# Patient Record
Sex: Female | Born: 1960
Health system: Southern US, Community
[De-identification: ages and names within clinical notes are randomized; demographics above are authoritative.]

## PROBLEM LIST (undated history)

## (undated) DIAGNOSIS — R0600 Dyspnea, unspecified: Secondary | ICD-10-CM

## (undated) DIAGNOSIS — J45901 Unspecified asthma with (acute) exacerbation: Secondary | ICD-10-CM

## (undated) DIAGNOSIS — Z8719 Personal history of other diseases of the digestive system: Secondary | ICD-10-CM

## (undated) DIAGNOSIS — M1990 Unspecified osteoarthritis, unspecified site: Secondary | ICD-10-CM

## (undated) DIAGNOSIS — F410 Panic disorder [episodic paroxysmal anxiety] without agoraphobia: Secondary | ICD-10-CM

## (undated) DIAGNOSIS — J189 Pneumonia, unspecified organism: Secondary | ICD-10-CM

## (undated) DIAGNOSIS — G5600 Carpal tunnel syndrome, unspecified upper limb: Secondary | ICD-10-CM

## (undated) DIAGNOSIS — F17200 Nicotine dependence, unspecified, uncomplicated: Secondary | ICD-10-CM

## (undated) DIAGNOSIS — K219 Gastro-esophageal reflux disease without esophagitis: Secondary | ICD-10-CM

## (undated) DIAGNOSIS — J449 Chronic obstructive pulmonary disease, unspecified: Secondary | ICD-10-CM

## (undated) HISTORY — DX: Chronic obstructive pulmonary disease, unspecified: J44.9

## (undated) HISTORY — PX: OOPHORECTOMY: SHX86

## (undated) HISTORY — DX: Gastro-esophageal reflux disease without esophagitis: K21.9

## (undated) HISTORY — DX: Carpal tunnel syndrome, unspecified upper limb: G56.00

## (undated) HISTORY — DX: Nicotine dependence, unspecified, uncomplicated: F17.200

## (undated) HISTORY — DX: Unspecified asthma with (acute) exacerbation: J45.901

## (undated) HISTORY — PX: APPENDECTOMY: SHX54

## (undated) HISTORY — DX: Personal history of other diseases of the digestive system: Z87.19

## (undated) HISTORY — DX: Panic disorder (episodic paroxysmal anxiety): F41.0

---

## 1998-04-27 ENCOUNTER — Emergency Department (HOSPITAL_COMMUNITY): Admission: EM | Admit: 1998-04-27 | Discharge: 1998-04-27 | Payer: Self-pay | Admitting: Internal Medicine

## 1998-05-18 ENCOUNTER — Inpatient Hospital Stay (HOSPITAL_COMMUNITY): Admission: AD | Admit: 1998-05-18 | Discharge: 1998-05-18 | Payer: Self-pay | Admitting: Obstetrics

## 1998-05-28 ENCOUNTER — Other Ambulatory Visit: Admission: RE | Admit: 1998-05-28 | Discharge: 1998-05-28 | Payer: Self-pay | Admitting: Gynecology

## 1998-07-09 ENCOUNTER — Other Ambulatory Visit: Admission: RE | Admit: 1998-07-09 | Discharge: 1998-07-09 | Payer: Self-pay | Admitting: Gynecology

## 1998-11-11 ENCOUNTER — Emergency Department (HOSPITAL_COMMUNITY): Admission: EM | Admit: 1998-11-11 | Discharge: 1998-11-11 | Payer: Self-pay | Admitting: Emergency Medicine

## 1999-05-18 ENCOUNTER — Encounter: Payer: Self-pay | Admitting: Emergency Medicine

## 1999-05-18 ENCOUNTER — Emergency Department (HOSPITAL_COMMUNITY): Admission: EM | Admit: 1999-05-18 | Discharge: 1999-05-18 | Payer: Self-pay | Admitting: Emergency Medicine

## 1999-06-18 ENCOUNTER — Emergency Department (HOSPITAL_COMMUNITY): Admission: EM | Admit: 1999-06-18 | Discharge: 1999-06-18 | Payer: Self-pay | Admitting: Emergency Medicine

## 2002-10-23 ENCOUNTER — Emergency Department (HOSPITAL_COMMUNITY): Admission: EM | Admit: 2002-10-23 | Discharge: 2002-10-23 | Payer: Self-pay | Admitting: Emergency Medicine

## 2002-10-23 ENCOUNTER — Encounter: Payer: Self-pay | Admitting: Emergency Medicine

## 2003-07-01 ENCOUNTER — Emergency Department (HOSPITAL_COMMUNITY): Admission: EM | Admit: 2003-07-01 | Discharge: 2003-07-01 | Payer: Self-pay | Admitting: Emergency Medicine

## 2003-08-14 ENCOUNTER — Ambulatory Visit (HOSPITAL_COMMUNITY): Admission: RE | Admit: 2003-08-14 | Discharge: 2003-08-14 | Payer: Self-pay | Admitting: Internal Medicine

## 2003-10-10 ENCOUNTER — Emergency Department (HOSPITAL_COMMUNITY): Admission: EM | Admit: 2003-10-10 | Discharge: 2003-10-10 | Payer: Self-pay | Admitting: Emergency Medicine

## 2004-07-02 ENCOUNTER — Ambulatory Visit (HOSPITAL_COMMUNITY): Admission: RE | Admit: 2004-07-02 | Discharge: 2004-07-02 | Payer: Self-pay | Admitting: Internal Medicine

## 2004-07-10 ENCOUNTER — Inpatient Hospital Stay (HOSPITAL_COMMUNITY): Admission: AD | Admit: 2004-07-10 | Discharge: 2004-07-10 | Payer: Self-pay | Admitting: *Deleted

## 2004-07-13 ENCOUNTER — Encounter (HOSPITAL_COMMUNITY): Admission: RE | Admit: 2004-07-13 | Discharge: 2004-07-14 | Payer: Self-pay | Admitting: Internal Medicine

## 2004-08-10 ENCOUNTER — Ambulatory Visit: Payer: Self-pay | Admitting: Obstetrics and Gynecology

## 2004-09-07 ENCOUNTER — Ambulatory Visit: Payer: Self-pay | Admitting: Obstetrics & Gynecology

## 2004-09-07 ENCOUNTER — Ambulatory Visit (HOSPITAL_COMMUNITY): Admission: RE | Admit: 2004-09-07 | Discharge: 2004-09-07 | Payer: Self-pay | Admitting: Internal Medicine

## 2004-10-07 ENCOUNTER — Inpatient Hospital Stay (HOSPITAL_COMMUNITY): Admission: AD | Admit: 2004-10-07 | Discharge: 2004-10-07 | Payer: Self-pay | Admitting: Obstetrics & Gynecology

## 2004-10-09 ENCOUNTER — Inpatient Hospital Stay (HOSPITAL_COMMUNITY): Admission: AD | Admit: 2004-10-09 | Discharge: 2004-10-09 | Payer: Self-pay | Admitting: Family Medicine

## 2004-11-02 ENCOUNTER — Ambulatory Visit: Payer: Self-pay | Admitting: Obstetrics & Gynecology

## 2005-09-07 ENCOUNTER — Inpatient Hospital Stay (HOSPITAL_COMMUNITY): Admission: AD | Admit: 2005-09-07 | Discharge: 2005-09-07 | Payer: Self-pay | Admitting: Obstetrics and Gynecology

## 2005-10-12 ENCOUNTER — Ambulatory Visit (HOSPITAL_COMMUNITY): Admission: RE | Admit: 2005-10-12 | Discharge: 2005-10-12 | Payer: Self-pay | Admitting: Internal Medicine

## 2006-05-30 ENCOUNTER — Encounter: Admission: RE | Admit: 2006-05-30 | Discharge: 2006-05-30 | Payer: Self-pay | Admitting: Family Medicine

## 2006-06-21 ENCOUNTER — Ambulatory Visit: Payer: Self-pay | Admitting: Obstetrics & Gynecology

## 2006-07-21 ENCOUNTER — Ambulatory Visit: Payer: Self-pay | Admitting: Internal Medicine

## 2006-09-07 ENCOUNTER — Ambulatory Visit: Payer: Self-pay | Admitting: Internal Medicine

## 2006-11-02 ENCOUNTER — Ambulatory Visit: Payer: Self-pay | Admitting: Internal Medicine

## 2006-12-06 ENCOUNTER — Ambulatory Visit: Payer: Self-pay | Admitting: Family Medicine

## 2007-01-02 ENCOUNTER — Ambulatory Visit: Payer: Self-pay | Admitting: Internal Medicine

## 2007-01-31 ENCOUNTER — Ambulatory Visit: Payer: Self-pay | Admitting: Internal Medicine

## 2007-02-09 ENCOUNTER — Ambulatory Visit: Payer: Self-pay | Admitting: Internal Medicine

## 2007-04-18 ENCOUNTER — Encounter: Payer: Self-pay | Admitting: Internal Medicine

## 2007-04-18 DIAGNOSIS — Z8719 Personal history of other diseases of the digestive system: Secondary | ICD-10-CM

## 2007-04-18 DIAGNOSIS — F410 Panic disorder [episodic paroxysmal anxiety] without agoraphobia: Secondary | ICD-10-CM | POA: Insufficient documentation

## 2007-04-18 DIAGNOSIS — M159 Polyosteoarthritis, unspecified: Secondary | ICD-10-CM | POA: Insufficient documentation

## 2007-04-18 DIAGNOSIS — K219 Gastro-esophageal reflux disease without esophagitis: Secondary | ICD-10-CM

## 2007-04-18 HISTORY — DX: Gastro-esophageal reflux disease without esophagitis: K21.9

## 2007-04-18 HISTORY — DX: Panic disorder (episodic paroxysmal anxiety): F41.0

## 2007-04-18 HISTORY — DX: Personal history of other diseases of the digestive system: Z87.19

## 2007-04-19 ENCOUNTER — Encounter: Payer: Self-pay | Admitting: Internal Medicine

## 2007-04-19 ENCOUNTER — Ambulatory Visit: Payer: Self-pay | Admitting: Internal Medicine

## 2007-04-19 DIAGNOSIS — R0989 Other specified symptoms and signs involving the circulatory and respiratory systems: Secondary | ICD-10-CM | POA: Insufficient documentation

## 2007-05-31 ENCOUNTER — Emergency Department (HOSPITAL_COMMUNITY): Admission: EM | Admit: 2007-05-31 | Discharge: 2007-05-31 | Payer: Self-pay | Admitting: Emergency Medicine

## 2007-06-27 ENCOUNTER — Ambulatory Visit: Payer: Self-pay | Admitting: Internal Medicine

## 2007-06-27 ENCOUNTER — Telehealth: Payer: Self-pay | Admitting: Internal Medicine

## 2007-06-29 ENCOUNTER — Encounter (INDEPENDENT_AMBULATORY_CARE_PROVIDER_SITE_OTHER): Payer: Self-pay

## 2007-07-23 ENCOUNTER — Telehealth (INDEPENDENT_AMBULATORY_CARE_PROVIDER_SITE_OTHER): Payer: Self-pay | Admitting: *Deleted

## 2007-08-23 ENCOUNTER — Telehealth (INDEPENDENT_AMBULATORY_CARE_PROVIDER_SITE_OTHER): Payer: Self-pay | Admitting: *Deleted

## 2007-08-31 ENCOUNTER — Telehealth: Payer: Self-pay | Admitting: *Deleted

## 2007-09-11 ENCOUNTER — Ambulatory Visit: Payer: Self-pay | Admitting: Internal Medicine

## 2007-09-19 ENCOUNTER — Telehealth: Payer: Self-pay | Admitting: Family Medicine

## 2007-09-20 ENCOUNTER — Ambulatory Visit: Payer: Self-pay | Admitting: Internal Medicine

## 2007-09-20 LAB — CONVERTED CEMR LAB
Bilirubin Urine: NEGATIVE
Blood in Urine, dipstick: NEGATIVE
Glucose, Urine, Semiquant: NEGATIVE
Ketones, urine, test strip: NEGATIVE
Nitrite: NEGATIVE
Protein, U semiquant: NEGATIVE
Specific Gravity, Urine: 1.02
Urobilinogen, UA: NEGATIVE
WBC Urine, dipstick: NEGATIVE
pH: 5

## 2007-10-23 ENCOUNTER — Telehealth (INDEPENDENT_AMBULATORY_CARE_PROVIDER_SITE_OTHER): Payer: Self-pay | Admitting: *Deleted

## 2007-11-06 ENCOUNTER — Ambulatory Visit: Payer: Self-pay | Admitting: Internal Medicine

## 2007-11-20 ENCOUNTER — Ambulatory Visit: Payer: Self-pay | Admitting: Family Medicine

## 2007-11-21 ENCOUNTER — Ambulatory Visit (HOSPITAL_COMMUNITY): Admission: RE | Admit: 2007-11-21 | Discharge: 2007-11-21 | Payer: Self-pay | Admitting: Family Medicine

## 2007-11-23 ENCOUNTER — Telehealth: Payer: Self-pay | Admitting: Family Medicine

## 2007-11-23 ENCOUNTER — Ambulatory Visit: Payer: Self-pay | Admitting: Family Medicine

## 2007-11-26 ENCOUNTER — Ambulatory Visit: Payer: Self-pay | Admitting: Internal Medicine

## 2007-12-05 ENCOUNTER — Telehealth: Payer: Self-pay | Admitting: Internal Medicine

## 2007-12-12 ENCOUNTER — Telehealth: Payer: Self-pay | Admitting: Internal Medicine

## 2008-01-09 ENCOUNTER — Telehealth: Payer: Self-pay | Admitting: Internal Medicine

## 2008-01-16 ENCOUNTER — Ambulatory Visit: Payer: Self-pay | Admitting: Internal Medicine

## 2008-01-16 DIAGNOSIS — G5600 Carpal tunnel syndrome, unspecified upper limb: Secondary | ICD-10-CM

## 2008-01-16 HISTORY — DX: Carpal tunnel syndrome, unspecified upper limb: G56.00

## 2008-02-13 ENCOUNTER — Telehealth: Payer: Self-pay | Admitting: Internal Medicine

## 2008-02-14 ENCOUNTER — Telehealth: Payer: Self-pay | Admitting: Internal Medicine

## 2008-02-27 ENCOUNTER — Ambulatory Visit: Payer: Self-pay | Admitting: Internal Medicine

## 2008-02-29 ENCOUNTER — Emergency Department (HOSPITAL_COMMUNITY): Admission: EM | Admit: 2008-02-29 | Discharge: 2008-02-29 | Payer: Self-pay | Admitting: Emergency Medicine

## 2008-03-14 ENCOUNTER — Ambulatory Visit: Payer: Self-pay | Admitting: Internal Medicine

## 2008-04-15 ENCOUNTER — Ambulatory Visit: Payer: Self-pay | Admitting: Internal Medicine

## 2008-05-06 ENCOUNTER — Telehealth: Payer: Self-pay | Admitting: Internal Medicine

## 2008-05-08 ENCOUNTER — Ambulatory Visit: Payer: Self-pay | Admitting: Internal Medicine

## 2008-05-08 DIAGNOSIS — J449 Chronic obstructive pulmonary disease, unspecified: Secondary | ICD-10-CM

## 2008-05-08 DIAGNOSIS — J4489 Other specified chronic obstructive pulmonary disease: Secondary | ICD-10-CM

## 2008-05-08 HISTORY — DX: Other specified chronic obstructive pulmonary disease: J44.89

## 2008-05-08 HISTORY — DX: Chronic obstructive pulmonary disease, unspecified: J44.9

## 2008-06-12 ENCOUNTER — Telehealth: Payer: Self-pay | Admitting: Family Medicine

## 2008-06-17 ENCOUNTER — Telehealth: Payer: Self-pay | Admitting: Internal Medicine

## 2008-08-21 ENCOUNTER — Ambulatory Visit: Payer: Self-pay | Admitting: Obstetrics and Gynecology

## 2008-08-21 ENCOUNTER — Encounter: Payer: Self-pay | Admitting: Obstetrics and Gynecology

## 2008-08-21 LAB — CONVERTED CEMR LAB
Glucose, Bld: 96 mg/dL (ref 70–99)
Hgb A1c MFr Bld: 5.6 % (ref 4.6–6.1)

## 2008-08-22 ENCOUNTER — Ambulatory Visit (HOSPITAL_COMMUNITY): Admission: RE | Admit: 2008-08-22 | Discharge: 2008-08-22 | Payer: Self-pay | Admitting: Obstetrics and Gynecology

## 2008-08-23 LAB — HM PAP SMEAR

## 2008-09-10 ENCOUNTER — Ambulatory Visit: Payer: Self-pay | Admitting: Obstetrics and Gynecology

## 2008-09-30 ENCOUNTER — Ambulatory Visit: Payer: Self-pay | Admitting: Internal Medicine

## 2008-11-04 ENCOUNTER — Ambulatory Visit (HOSPITAL_COMMUNITY): Admission: RE | Admit: 2008-11-04 | Discharge: 2008-11-04 | Payer: Self-pay | Admitting: Internal Medicine

## 2008-11-07 ENCOUNTER — Ambulatory Visit: Payer: Self-pay | Admitting: Internal Medicine

## 2008-12-23 ENCOUNTER — Ambulatory Visit: Payer: Self-pay | Admitting: Internal Medicine

## 2009-02-20 ENCOUNTER — Ambulatory Visit: Payer: Self-pay | Admitting: Family Medicine

## 2009-02-20 DIAGNOSIS — M542 Cervicalgia: Secondary | ICD-10-CM | POA: Insufficient documentation

## 2009-03-02 ENCOUNTER — Telehealth: Payer: Self-pay | Admitting: Internal Medicine

## 2009-05-11 ENCOUNTER — Telehealth: Payer: Self-pay | Admitting: Internal Medicine

## 2009-06-22 ENCOUNTER — Inpatient Hospital Stay (HOSPITAL_COMMUNITY): Admission: AD | Admit: 2009-06-22 | Discharge: 2009-06-22 | Payer: Self-pay | Admitting: Obstetrics & Gynecology

## 2009-06-22 ENCOUNTER — Ambulatory Visit: Payer: Self-pay | Admitting: Obstetrics and Gynecology

## 2009-07-13 ENCOUNTER — Telehealth: Payer: Self-pay | Admitting: Internal Medicine

## 2009-07-17 ENCOUNTER — Ambulatory Visit: Payer: Self-pay | Admitting: Internal Medicine

## 2009-07-17 DIAGNOSIS — F17200 Nicotine dependence, unspecified, uncomplicated: Secondary | ICD-10-CM | POA: Insufficient documentation

## 2009-07-17 HISTORY — DX: Nicotine dependence, unspecified, uncomplicated: F17.200

## 2009-07-20 ENCOUNTER — Ambulatory Visit: Payer: Self-pay | Admitting: Internal Medicine

## 2009-11-04 ENCOUNTER — Telehealth: Payer: Self-pay | Admitting: Internal Medicine

## 2009-11-06 ENCOUNTER — Ambulatory Visit: Payer: Self-pay | Admitting: Internal Medicine

## 2009-12-14 ENCOUNTER — Telehealth: Payer: Self-pay

## 2010-01-19 ENCOUNTER — Ambulatory Visit: Payer: Self-pay | Admitting: Internal Medicine

## 2010-01-22 ENCOUNTER — Telehealth: Payer: Self-pay | Admitting: Internal Medicine

## 2010-01-25 ENCOUNTER — Telehealth: Payer: Self-pay | Admitting: Internal Medicine

## 2010-02-17 ENCOUNTER — Telehealth: Payer: Self-pay | Admitting: Internal Medicine

## 2010-02-20 LAB — HM MAMMOGRAPHY

## 2010-02-22 ENCOUNTER — Ambulatory Visit (HOSPITAL_COMMUNITY): Admission: RE | Admit: 2010-02-22 | Discharge: 2010-02-22 | Payer: Self-pay | Admitting: Internal Medicine

## 2010-03-15 ENCOUNTER — Telehealth: Payer: Self-pay | Admitting: Internal Medicine

## 2010-04-02 ENCOUNTER — Ambulatory Visit: Payer: Self-pay | Admitting: Family Medicine

## 2010-04-05 ENCOUNTER — Telehealth: Payer: Self-pay | Admitting: Internal Medicine

## 2010-04-29 ENCOUNTER — Telehealth: Payer: Self-pay | Admitting: Internal Medicine

## 2010-05-05 ENCOUNTER — Telehealth: Payer: Self-pay | Admitting: Internal Medicine

## 2010-06-03 ENCOUNTER — Telehealth: Payer: Self-pay | Admitting: Internal Medicine

## 2010-07-02 ENCOUNTER — Telehealth: Payer: Self-pay | Admitting: Internal Medicine

## 2010-07-21 ENCOUNTER — Ambulatory Visit: Payer: Self-pay | Admitting: Internal Medicine

## 2010-07-21 DIAGNOSIS — J45901 Unspecified asthma with (acute) exacerbation: Secondary | ICD-10-CM | POA: Insufficient documentation

## 2010-07-21 HISTORY — DX: Unspecified asthma with (acute) exacerbation: J45.901

## 2010-07-23 ENCOUNTER — Ambulatory Visit: Payer: Self-pay | Admitting: Gynecology

## 2010-07-23 ENCOUNTER — Inpatient Hospital Stay (HOSPITAL_COMMUNITY)
Admission: AD | Admit: 2010-07-23 | Discharge: 2010-07-23 | Payer: Self-pay | Source: Home / Self Care | Admitting: Obstetrics and Gynecology

## 2010-07-26 ENCOUNTER — Telehealth: Payer: Self-pay | Admitting: Internal Medicine

## 2010-07-28 ENCOUNTER — Ambulatory Visit: Payer: Self-pay | Admitting: Family Medicine

## 2010-07-28 ENCOUNTER — Telehealth: Payer: Self-pay | Admitting: Family Medicine

## 2010-07-29 ENCOUNTER — Telehealth: Payer: Self-pay | Admitting: Family Medicine

## 2010-08-05 ENCOUNTER — Telehealth: Payer: Self-pay | Admitting: Family Medicine

## 2010-08-06 ENCOUNTER — Ambulatory Visit: Payer: Self-pay | Admitting: Family Medicine

## 2010-08-10 ENCOUNTER — Ambulatory Visit: Payer: Self-pay | Admitting: Cardiovascular Disease

## 2010-08-10 ENCOUNTER — Telehealth: Payer: Self-pay | Admitting: Family Medicine

## 2010-08-12 ENCOUNTER — Ambulatory Visit: Payer: Self-pay | Admitting: Obstetrics and Gynecology

## 2010-08-16 ENCOUNTER — Ambulatory Visit (HOSPITAL_COMMUNITY): Admission: RE | Admit: 2010-08-16 | Discharge: 2010-08-16 | Payer: Self-pay | Admitting: Family Medicine

## 2010-08-26 ENCOUNTER — Telehealth: Payer: Self-pay | Admitting: Family Medicine

## 2010-08-31 ENCOUNTER — Telehealth: Payer: Self-pay | Admitting: Family Medicine

## 2010-09-01 ENCOUNTER — Ambulatory Visit: Payer: Self-pay | Admitting: Obstetrics & Gynecology

## 2010-09-21 ENCOUNTER — Telehealth: Payer: Self-pay | Admitting: Family Medicine

## 2010-09-24 ENCOUNTER — Ambulatory Visit: Payer: Self-pay | Admitting: Family Medicine

## 2010-09-27 ENCOUNTER — Telehealth: Payer: Self-pay | Admitting: Family Medicine

## 2010-10-26 ENCOUNTER — Telehealth: Payer: Self-pay | Admitting: *Deleted

## 2010-10-30 ENCOUNTER — Encounter: Payer: Self-pay | Admitting: Internal Medicine

## 2010-10-30 ENCOUNTER — Encounter: Payer: Self-pay | Admitting: *Deleted

## 2010-11-09 NOTE — Progress Notes (Signed)
Summary: Pt req change in dosage of Alprazolam  Phone Note Call from Patient Call back at Home Phone [phone]   Caller: Patient Summary of Call: Pt called and wants to req that Dr. Amador Cunas adjust her dosage of Alprazolam. Pt is taking 3 once daily instead of 2 and a half. Pt is having difficulty sleeping at night. [facility] [place] [phone] Initial call taken by: Lucy Antigua,  Feb 17, 2010 8:10 AM  Follow-up for Phone Call        do  NOT recomend that patient take more than one 2 mg lorazepam at  a time Follow-up by: Gordy Savers  MD,  Feb 17, 2010 8:37 AM  Additional Follow-up for Phone Call Additional follow up Details #1::        spoke with pt - explained that Dr. Amador Cunas does not recommend taking more than 2mg  at one time. If insomnia contiunes to be a problem - need to see. KIK Additional Follow-up by: Duard Brady LPN,  Feb 17, 2010 8:40 AM

## 2010-11-09 NOTE — Assessment & Plan Note (Signed)
Summary: f/u on bronchitis//cough//lch   Vital Signs:  Patient profile:   50 year old female Weight:      166 pounds O2 Sat:      97 % on Room air Temp:     98.0 degrees F oral BP sitting:   130 / 94  (right arm) Cuff size:   regular  Vitals Entered By: Duard Brady LPN (July 21, 2010 10:36 AM)  O2 Flow:  Room air CC: c/o bronchitis - non productive cough  Is Patient Diabetic? No   CC:  c/o bronchitis - non productive cough .  History of Present Illness: 50 year old patient who has a history of ongoing tobacco use.  She was seen at an urgent care 3 days ago and treated for asthmatic bronchitis.  She is completing a prednisone Dosepak and has been on Avelox antibiotic therapy.  She is modestly improved.  She still feels tight, but no productive cough.  No fever or chills.  Denies any significant shortness of breath.  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 Panic disorder DJD Low back pain GERD; history of H. pylori Gastritis tobacco abuse COPD  Past Surgical History: Appendectomy Oophorectomy gravida one, para one, aborta zero coloscopy 200  Review of Systems       The patient complains of anorexia and prolonged cough.  The patient denies fever, weight loss, weight gain, vision loss, decreased hearing, hoarseness, chest pain, syncope, dyspnea on exertion, peripheral edema, headaches, hemoptysis, abdominal pain, melena, hematochezia, severe indigestion/heartburn, hematuria, incontinence, genital sores, muscle weakness, suspicious skin lesions, transient blindness, difficulty walking, depression, unusual weight change, abnormal bleeding, enlarged lymph nodes, angioedema, and breast masses.    Physical Exam  General:   Well-developed,well-nourished,in no acute distress; alert,appropriate and cooperative throughout examination Head:  Normocephalic and atraumatic without obvious abnormalities. No apparent alopecia or balding. Eyes:  No corneal or conjunctival inflammation noted. EOMI. Perrla. Funduscopic exam benign, without hemorrhages, exudates or papilledema. Vision grossly normal. Ears:  External ear exam shows no significant lesions or deformities.  Otoscopic examination reveals clear canals, tympanic membranes are intact bilaterally without bulging, retraction, inflammation or discharge. Hearing is grossly normal bilaterally. Mouth:  Oral mucosa and oropharynx without lesions or exudates.  Teeth in good repair. Neck:  No deformities, masses, or tenderness noted. Lungs:  scattered  rhonchi and faint wheezing O2 saturation 97% Heart:  Normal rate and regular rhythm. S1 and S2 normal without gallop, murmur, click, rub or other extra sounds.  pulse rate 82 Abdomen:  Bowel sounds positive,abdomen soft and non-tender without masses, organomegaly or hernias noted.   Impression & Recommendations:  Problem # 1:  ASTHMA UNSPECIFIED WITH EXACERBATION (ICD-493.92)  Her updated medication list for this problem includes:    Proair Hfa 108 (90 Base) Mcg/act Aers (Albuterol sulfate) .Marland Kitchen... 2 puffs qid    Qvar 40 Mcg/act Aers (Beclomethasone dipropionate) .Marland Kitchen..Marland Kitchen Two puffs daily  Her updated medication list for this problem includes:    Proair Hfa 108 (90 Base) Mcg/act Aers (Albuterol sulfate) .Marland Kitchen... 2 puffs qid    Qvar 40 Mcg/act Aers (Beclomethasone dipropionate) .Marland Kitchen..Marland Kitchen Two puffs daily  Problem # 2:  TOBACCO ABUSE (ICD-305.1)  Complete Medication List: 1)  Alprazolam 2 Mg Tabs (Alprazolam) .... One two times a day and 1/2 once  daily 2)  Multivitamins Caps (Multiple vitamin) .... Once daily 3)  Proair Hfa 108 (90 Base) Mcg/act Aers (Albuterol sulfate) .... 2 puffs qid 4)  Qvar 40 Mcg/act Aers (Beclomethasone  dipropionate) .... Two puffs daily 5)  Hydrocodone-acetaminophen 10-325 Mg Tabs (Hydrocodone-acetaminophen) .Marland Kitchen.. 1 by mouth q6hrs as needed pain  Patient Instructions: 1)  Drink as much fluid as you can tolerate for the next few days. 2)  Tobacco is very bad for your health and your loved ones! You Should stop smoking!. 3)  Take your antibiotic as prescribed until ALL of it is gone, but stop if you develop a rash or swelling and contact our office as soon as possible.

## 2010-11-09 NOTE — Progress Notes (Signed)
Summary: REQ FOR SAMPLES - PPI  Phone Note Call from Patient   Caller: Patient [phone] Reason for Call: Talk to Nurse, Talk to Doctor Summary of Call: Pt called in to req samples of aciphex...Marland KitchenMarland Kitchen Pt adv that she can be reached at  [phone]  or  [phone] when same is ready for p/u.      Initial call taken by: Debbra Riding,  December 14, 2009 2:37 PM  Follow-up for Phone Call        Aciphex not on current med list. Spoke with Dr. Frederica Kuster - ok to give PPI samlpes. Pt aware ready for pick up.  Follow-up by: Duard Brady LPN,  December 14, 2009 2:45 PM

## 2010-11-09 NOTE — Progress Notes (Signed)
Summary: Pt called re: new dosage amount of Hydrocodone  Phone Note Call from Patient Call back at Home Phone [phone] Call back at [phone] cell   Caller: Patient Summary of Call: Pt called and said that the dosage of the Hydrocodone was changed during last visit. Please be sure to write the script with new dosage information. Pt says that med isnt due yet, but was told by Dr. Amador Cunas, to be sure to call in advance to make sure that med is written for the new dosage amount as discussed.  Initial call taken by: Lucy Antigua,  January 22, 2010 10:35 AM  Follow-up for Phone Call        to Dr. Amador Cunas to advise on change in med - I dont see any discussion noted in last office vist. KIK Follow-up by: Duard Brady LPN,  January 22, 2010 11:23 AM    noted

## 2010-11-09 NOTE — Assessment & Plan Note (Signed)
Summary: fup//ccm   and a right and a  Vital Signs:  Patient profile:   50 year old female Weight:      181 pounds O2 Sat:      97 % on Room air Temp:     97.4 degrees F oral BP sitting:   124 / 74  (left arm) Cuff size:   regular  Vitals Entered By: Duard Brady LPN (January 19, 2010 8:42 AM)  O2 Flow:  Room air And to the  History of Present Illness: 50 year old patient who is in today  for follow-up.  She has a history of COPD, and ongoing tobacco use.  She states she has cut her tobacco consumption down to one half pack per day.  There has been considerable situational stress and she and her husband have recently separated.  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  She has a chronic low back pain.  She is followed annually at the [facility] clinic with laboratory studies performed.   she continues to use hydrocodone 4 times daily.  Her next prescription is not due until April 28. It was encouraged she investigate [facility]   Allergies: 1)  Aspirin (Aspirin)  Past History:  Past Medical History: Reviewed history from 05/08/2008 and no changes required. Panic disorder DJD Low back pain GERD; history of H. pylori Gastritis tobacco abuse COPD  Past Surgical History: Reviewed history from 02/27/2008 and no changes required. Appendectomy Oophorectomy gravida one, para one, aborta zero coloscopy 2004  Family History: Reviewed history from 09/11/2007 and no changes required. father died age 28, lung cancer.  History:coronary artery disease mother 41 history of diabetes one brother one sister bothare disabled  Social History: Reviewed history from 03/14/2008 and no changes required. Single Current Smoker no insurance  Review of Systems  The patient denies anorexia, fever, weight loss, weight gain, vision loss, decreased hearing, hoarseness, chest pain, syncope, dyspnea on exertion, peripheral edema, prolonged cough, headaches, hemoptysis,  abdominal pain, melena, hematochezia, severe indigestion/heartburn, hematuria, incontinence, genital sores, muscle weakness, suspicious skin lesions, transient blindness, difficulty walking, depression, unusual weight change, abnormal bleeding, enlarged lymph nodes, angioedema, and breast masses.    Physical Exam  General:  overweight-appearing.  122/78overweight-appearing.   Head:  Normocephalic and atraumatic without obvious abnormalities. No apparent alopecia or balding. Eyes:  No corneal or conjunctival inflammation noted. EOMI. Perrla. Funduscopic exam benign, without hemorrhages, exudates or papilledema. Vision grossly normal. Ears:  External ear exam shows no significant lesions or deformities.  Otoscopic examination reveals clear canals, tympanic membranes are intact bilaterally without bulging, retraction, inflammation or discharge. Hearing is grossly normal bilaterally. Mouth:  Oral mucosa and oropharynx without lesions or exudates.  Teeth in good repair. Neck:  No deformities, masses, or tenderness noted. Lungs:  Normal respiratory effort, chest expands symmetrically. Lungs are clear to auscultation, no crackles or wheezes. O2 saturation 97 Heart:  Normal rate and regular rhythm. S1 and S2 normal without gallop, murmur, click, rub or other extra sounds. Abdomen:  Bowel sounds positive,abdomen soft and non-tender without masses, organomegaly or hernias noted. Msk:  No deformity or scoliosis noted of thoracic or lumbar spine.   Pulses:  R and L carotid,radial,femoral,dorsalis pedis and posterior tibial pulses are full and equal bilaterally   Impression & Recommendations:  Problem # 1:  TOBACCO ABUSE (ICD-305.1)  Problem # 2:  COPD (ICD-496)  Her updated medication list for this problem includes:    Proair Hfa 108 (90 Base) Mcg/act Aers (Albuterol sulfate) .Marland KitchenMarland KitchenMarland KitchenMarland Kitchen  2 puffs qid    Qvar 40 Mcg/act Aers (Beclomethasone dipropionate) .Marland Kitchen..Marland Kitchen Two puffs daily  Her updated medication list for  this problem includes:    Proair Hfa 108 (90 Base) Mcg/act Aers (Albuterol sulfate) .Marland Kitchen... 2 puffs qid    Qvar 40 Mcg/act Aers (Beclomethasone dipropionate) .Marland Kitchen..Marland Kitchen Two puffs daily  Problem # 3:  PANIC DISORDER (ICD-300.01)  Her updated medication list for this problem includes:    Alprazolam 2 Mg Tabs (Alprazolam) ..... One two times a day and 1/2 once daily  Her updated medication list for this problem includes:    Alprazolam 2 Mg Tabs (Alprazolam) ..... One two times a day and 1/2 once daily  Complete Medication List: 1)  Alprazolam 2 Mg Tabs (Alprazolam) .... One two times a day and 1/2 once daily 2)  Multivitamins Caps (Multiple vitamin) .... Once daily 3)  Proair Hfa 108 (90 Base) Mcg/act Aers (Albuterol sulfate) .... 2 puffs qid 4)  Qvar 40 Mcg/act Aers (Beclomethasone dipropionate) .... Two puffs daily 5)  Hydrocodone-acetaminophen 10-500 Mg Tabs (Hydrocodone-acetaminophen) .... One every 6  hours as needed for pain  Patient Instructions: 1)  Please schedule a follow-up appointment in 6 months. 2)  Limit your Sodium (Salt). 3)  It is important that you exercise regularly at least 20 minutes 5 times a week. If you develop chest pain, have severe difficulty breathing, or feel very tired , stop exercising immediately and seek medical attention. 4)  You need to lose weight. Consider a lower calorie diet and regular exercise.  5)  Take calcium +Vitamin D daily. Prescriptions: QVAR 40 MCG/ACT  AERS (BECLOMETHASONE DIPROPIONATE) two puffs daily  #1 x 6   Entered and Authorized by:   Gordy Savers  MD   Signed by:   Gordy Savers  MD on 01/19/2010   Method used:   Print then Give to Patient   RxID:   [id number] PROAIR HFA 108 (90 BASE) MCG/ACT  AERS (ALBUTEROL SULFATE) 2 puffs qid  #3 x 6   Entered and Authorized by:   Gordy Savers  MD   Signed by:   Gordy Savers  MD on 01/19/2010   Method used:   Print then Give to Patient   RxID:    [id number] QVAR 40 MCG/ACT  AERS (BECLOMETHASONE DIPROPIONATE) two puffs daily  #1 x 6   Entered and Authorized by:   Gordy Savers  MD   Signed by:   Gordy Savers  MD on 01/19/2010   Method used:   Electronically to        [facility]  [phone]* (retail)       [address]       [place], [place]  [address]       Ph: [phone] or [phone]       Fax: [phone]   RxID:   [id number] PROAIR HFA 108 (90 BASE) MCG/ACT  AERS (ALBUTEROL SULFATE) 2 puffs qid  #3 x 6   Entered and Authorized by:   Gordy Savers  MD   Signed by:   Gordy Savers  MD on 01/19/2010   Method used:   Electronically to        [facility]  [phone]* (retail)       [address]       [place], [place]  [address]  Ph: [phone] or [phone]       Fax: [phone]   RxID:   [id number]

## 2010-11-09 NOTE — Assessment & Plan Note (Signed)
Summary: vomitting/headache/swollen eyes/cjr   Vital Signs:  Patient profile:   50 year old female Weight:      174 pounds Temp:     98.0 degrees F oral BP sitting:   142 / 82  (left arm) Cuff size:   regular  Vitals Entered By: Sid Falcon LPN (April 02, 2010 2:33 PM) CC: headache, nausea, fever   History of Present Illness: headaches, nausea, nasal congestion and yellowish nasal d/c past couple of weeks.  No vomiting and no abdominal pain. Occ mild sore throat.  No definite fever.  Some diffuse facial pain. Has taken doxycycline in past without difficulty.  Allergies: 1)  Aspirin (Aspirin)  Past History:  Past Medical History: Last updated: 05/08/2008 Panic disorder DJD Low back pain GERD; history of H. pylori Gastritis tobacco abuse COPD  Social History: Last updated: 03/14/2008 Single Current Smoker no insurance PMH reviewed for relevance  Review of Systems      See HPI  Physical Exam  General:  Well-developed,well-nourished,in no acute distress; alert,appropriate and cooperative throughout examination Head:  Normocephalic and atraumatic without obvious abnormalities. No apparent alopecia or balding. Ears:  External ear exam shows no significant lesions or deformities.  Otoscopic examination reveals clear canals, tympanic membranes are intact bilaterally without bulging, retraction, inflammation or discharge. Hearing is grossly normal bilaterally. Mouth:  Oral mucosa and oropharynx without lesions or exudates.  Teeth in good repair. Neck:  No deformities, masses, or tenderness noted. Lungs:  Normal respiratory effort, chest expands symmetrically. Lungs are clear to auscultation, no crackles or wheezes. Heart:  normal rate and regular rhythm.     Impression & Recommendations:  Problem # 1:  SINUSITIS, ACUTE (ICD-461.9)  Her updated medication list for this problem includes:    Doxycycline Hyclate 100 Mg Caps (Doxycycline hyclate) ..... One by mouth two  times a day for 10 days  Complete Medication List: 1)  Alprazolam 2 Mg Tabs (Alprazolam) .... One two times a day and 1/2 once daily 2)  Multivitamins Caps (Multiple vitamin) .... Once daily 3)  Proair Hfa 108 (90 Base) Mcg/act Aers (Albuterol sulfate) .... 2 puffs qid 4)  Qvar 40 Mcg/act Aers (Beclomethasone dipropionate) .... Two puffs daily 5)  Hydrocodone-acetaminophen 10-500 Mg Tabs (Hydrocodone-acetaminophen) .... One every 6  hours as needed for pain 6)  Hydrocodone-acetaminophen 10-325 Mg Tabs (Hydrocodone-acetaminophen) .Marland Kitchen.. 1 by mouth q6hrs as needed pain 7)  Doxycycline Hyclate 100 Mg Caps (Doxycycline hyclate) .... One by mouth two times a day for 10 days 8)  Promethazine Hcl 25 Mg Tabs (Promethazine hcl) .... One by mouth q 6 hours as needed for nausea and vomiting.  Patient Instructions: 1)  Acute sinusitis symptoms for less than 10 days are not helped by antibiotics. Use warm moist compresses, and over the counter decongestants( only as directed). Call if no improvement in 5-7 days, sooner if increasing pain, fever, or new symptoms.  Prescriptions: PROMETHAZINE HCL 25 MG TABS (PROMETHAZINE HCL) one by mouth q 6 hours as needed for nausea and vomiting.  #10 x 0   Entered and Authorized by:   Evelena Peat MD   Signed by:   Evelena Peat MD on 04/02/2010   Method used:   [facility]  [place] [address] [facility]       [address]       [place], [place]  [address]       Ph: [phone] or [phone]  Fax: [phone]   RxID:   [id number] DOXYCYCLINE HYCLATE 100 MG CAPS (DOXYCYCLINE HYCLATE) one by mouth two times a day for 10 days  #20 x 0   Entered and Authorized by:   Evelena Peat MD   Signed by:   Evelena Peat MD on 04/02/2010   Method used:   [facility]        [facility]  [place] [address] ([facility]ail[facility]       [address]       [place], [place]  [address]       Ph: [phone] or [phone]       Fax: [phone]   RxID:    [id number]

## 2010-11-09 NOTE — Assessment & Plan Note (Signed)
Summary: COUGH, CONGESTION // RS   Vital Signs:  Patient profile:   50 year old female Weight:      166 pounds Temp:     98.1 degrees F oral BP sitting:   130 / 94  (left arm) Cuff size:   regular  Vitals Entered By: Sid Falcon LPN (July 28, 2010 2:46 PM)   History of Present Illness: Patient seen with persistent cough. She has a long history of smoking and reported history of asthma. Also has reported history COPD. She was seen on the eighth of this month at urgent care Center prescribed Avelox and given some type of inhaler presumably albuterol. Patient started oral prednisone taper that time. Seen here 4 days later not much improved. She has mostly dry cough with no fever. No hemoptysis. Increased shortness of breath and wheezing.  Continues to smoke.  Poor appetite over the past few weeks. Denies weight changes but has lost some weight today compared to recent months. Using Qvar inhaler 2 puffs twice daily  Preventive Screening-Counseling & Management  Alcohol-Tobacco     Smoking Status: current     Packs/Day: 1.0     Year Started: 1976  Allergies: 1)  Aspirin (Aspirin)  Past History:  Past Medical History: Last updated: 05/08/2008 Panic disorder DJD Low back pain GERD; history of H. pylori Gastritis tobacco abuse COPD PMH reviewed for relevance  Social History: Packs/Day:  1.0  Review of Systems      See HPI  Physical Exam  General:  patient coughing during exam in no respiratory distress Head:  Normocephalic and atraumatic without obvious abnormalities. No apparent alopecia or balding. Ears:  External ear exam shows no significant lesions or deformities.  Otoscopic examination reveals clear canals, tympanic membranes are intact bilaterally without bulging, retraction, inflammation or discharge. Hearing is grossly normal bilaterally. Mouth:  Oral mucosa and oropharynx without lesions or exudates.  Teeth in good repair. Neck:  No deformities, masses, or  tenderness noted. Lungs:  patient has diffuse wheezes and scattered rhonchi. No rales. No retractions Heart:  Normal rate and regular rhythm. S1 and S2 normal without gallop, murmur, click, rub or other extra sounds. Extremities:  No clubbing, cyanosis, edema, or deformity noted with normal full range of motion of all joints.     Impression & Recommendations:  Problem # 1:  COUGH (ICD-786.2)  Depo-Medrol 80 mg given. Obtain chest x-ray. Start Symbicort in place of Qvar with samples given  Orders: T-2 View CXR (71020TC) Depo- Medrol 80mg  (J1040) Admin of Therapeutic Inj  intramuscular or subcutaneous ([address])  Complete Medication List: 1)  Alprazolam 2 Mg Tabs (Alprazolam) .... One two times a day and 1/2 once daily 2)  Multivitamins Caps (Multiple vitamin) .... Once daily 3)  Proair Hfa 108 (90 Base) Mcg/act Aers (Albuterol sulfate) .... 2 puffs qid 4)  Qvar 40 Mcg/act Aers (Beclomethasone dipropionate) .... Two puffs daily 5)  Hydrocodone-acetaminophen 10-325 Mg Tabs (Hydrocodone-acetaminophen) .Marland Kitchen.. 1 by mouth q6hrs as needed pain  Patient Instructions: 1)  Be in touch if cough not improving over the next week 2)  Hold Qvar 3)  Start symbicort 2 puffs twice daily.   Medication Administration  Injection # 1:    Medication: Depo- Medrol 80mg     Diagnosis: COUGH (ICD-786.2)    Route: IM    Site: LUOQ gluteus    Exp Date: 01/08/2013    Lot #: [id number]    Mfr: Pharmacia    Patient tolerated injection without complications  Given by: Sid Falcon LPN (July 28, 2010 4:09 PM)  Orders Added: 1)  T-2 View CXR [71020TC] 2)  Depo- Medrol 80mg  [J1040] 3)  Admin of Therapeutic Inj  intramuscular or subcutaneous [96372] 4)  Est. Patient Level III [[address]]

## 2010-11-09 NOTE — Miscellaneous (Signed)
Summary: chage in hydrocodone acteamin. dose  Clinical Lists Changes  Medications: Changed medication from HYDROCODONE-ACETAMINOPHEN 10-500 MG TABS (HYDROCODONE-ACETAMINOPHEN) one every 6  hours as needed for pain to HYDROCODONE-ACETAMINOPHEN 10-500 MG TABS (HYDROCODONE-ACETAMINOPHEN) one every 6  hours as needed for pain Added new medication of HYDROCODONE-ACETAMINOPHEN 10-325 MG TABS (HYDROCODONE-ACETAMINOPHEN) 1 by mouth q6hrs as needed pain - Signed Rx of HYDROCODONE-ACETAMINOPHEN 10-325 MG TABS (HYDROCODONE-ACETAMINOPHEN) 1 by mouth q6hrs as needed pain;  #100 x 2;  Signed;  Entered by: Duard Brady LPN;  Authorized by: Gordy Savers  MD;  Method used: Historical    Prescriptions: HYDROCODONE-ACETAMINOPHEN 10-325 MG TABS (HYDROCODONE-ACETAMINOPHEN) 1 by mouth q6hrs as needed pain  #100 x 2   Entered by:   Duard Brady LPN   Authorized by:   Gordy Savers  MD   Signed by:   Duard Brady LPN on 62/95/2841   Method used:   Historical   RxID:   [id number]

## 2010-11-09 NOTE — Progress Notes (Signed)
Summary: Hydrocodone refill request, pt needs OV, #90 only  Phone Note From Pharmacy   [facility]  [place] [address] [phone][facility] Call For: [place]  Summary of Call: Pharmacy faxed request to fill Hydrocodone 10-325, #100.   Last filled 10/21, controlled substance signed 07/30/10 Initial call taken by: Sid Falcon LPN,  August 26, 2010 1:52 PM  Follow-up for Phone Call        I have just taken over care of this pt.  We had seen her for cough issues. If she is to remain on chronic pain meds we need to discuss chronic pain rx issues.  I'm not sure how long she has been on this med.  May ref #90 until follow up. Follow-up by: Evelena Peat MD,  August 26, 2010 5:23 PM    Prescriptions: HYDROCODONE-ACETAMINOPHEN 10-325 MG TABS (HYDROCODONE-ACETAMINOPHEN) 1 by mouth q6hrs as needed pain  #90 x 0   Entered by:   Sid Falcon LPN   Authorized by:   Evelena Peat MD   Signed by:   Sid Falcon LPN on 96/01/5408   Method used:   [facility]phoned to ...       [facility]  [place] [address] (retail)       [address]       [place], [place]  [address]       Ph: [phone] or [phone]       Fax: [phone]   RxID:   [id number]

## 2010-11-09 NOTE — Progress Notes (Signed)
Summary: rx requested Tussionex   Phone Note Call from Patient Call back at [phone]   Caller: Patient-----triage vm Summary of Call: requesting rx for cough. [facility] to [facility]. Initial call taken by: Warnell Forester,  August 10, 2010 2:41 PM  Follow-up for Phone Call        may refill Tussionex 60 ml one tsp by mouth at bedtime with no refills.  This should be used short term only. Follow-up by: Evelena Peat MD,  August 10, 2010 5:41 PM    Prescriptions: [name] ER 10-8 MG/5ML LQCR (HYDROCOD POLST-CHLORPHEN POLST) one tsp by mouth q 12 hours as needed cough  #60 ml x 0   Entered by:   Sid Falcon LPN   Authorized by:   Evelena Peat MD   Signed by:   Sid Falcon LPN on 43/32/9518   Method used:   Telephoned to ...       [facility]  [place] [address] (retail)       [address]       [place], [place]  [address]       Ph: [phone] or [phone]       Fax: [phone]   RxID:   [id number]

## 2010-11-09 NOTE — Assessment & Plan Note (Signed)
Summary: heart fluttering on and off for 1 month/njr   Vital Signs:  Patient profile:   50 year old female Weight:      180 pounds Temp:     98.0 degrees F oral Pulse rate:   84 / minute Pulse rhythm:   regular BP sitting:   138 / 86  (left arm) Cuff size:   regular  Vitals Entered By: Raechel Ache, RN (November 06, 2009 9:17 AM) CC: C/o muscles aching and twitching, heart racing. Wants refills meds. Is Patient Diabetic? No   CC:  C/o muscles aching and twitching and heart racing. Wants refills meds..  History of Present Illness: 50 year old patient seen today for follow-up.  She has a history of COPD and ongoing tobacco use.  She has osteoarthritis and a history of low back pain.  She is seen here today basically because she has run out of her narcotic analgesic, early.  She has signed a pain contract in the past.  She complains of increasing fairly generalized pain and stiffness.  She states that her mother has a history of crippling rheumatoid arthritis, as well as fibromyalgia.  Her daughter has been diagnosed recently with suspected seronegative RA.  She complains some palpitations caused by caffeine and nicotine use  Preventive Screening-Counseling & Management  Alcohol-Tobacco     Smoking Cessation Counseling: yes  Allergies: 1)  Aspirin (Aspirin)  Past History:  Past Medical History: Reviewed history from 05/08/2008 and no changes required. Panic disorder DJD Low back pain GERD; history of H. pylori Gastritis tobacco abuse COPD  Review of Systems  The patient denies anorexia, fever, weight loss, weight gain, vision loss, decreased hearing, hoarseness, chest pain, syncope, dyspnea on exertion, peripheral edema, prolonged cough, headaches, hemoptysis, abdominal pain, melena, hematochezia, severe indigestion/heartburn, hematuria, incontinence, genital sores, muscle weakness, suspicious skin lesions, transient blindness, difficulty walking, depression, unusual weight  change, abnormal bleeding, enlarged lymph nodes, angioedema, and breast masses.    Physical Exam  General:  overweight-appearing.  140/82overweight-appearing.   Lungs:  Normal respiratory effort, chest expands symmetrically. Lungs are clear to auscultation, no crackles or wheezes. Msk:  no sign of active arthritis   Impression & Recommendations:  Problem # 1:  TOBACCO ABUSE (ICD-305.1)  Problem # 2:  COPD (ICD-496)  Her updated medication list for this problem includes:    Proair Hfa 108 (90 Base) Mcg/act Aers (Albuterol sulfate) .Marland Kitchen... 2 puffs qid    Qvar 40 Mcg/act Aers (Beclomethasone dipropionate) .Marland Kitchen..Marland Kitchen Two puffs daily  Her updated medication list for this problem includes:    Proair Hfa 108 (90 Base) Mcg/act Aers (Albuterol sulfate) .Marland Kitchen... 2 puffs qid    Qvar 40 Mcg/act Aers (Beclomethasone dipropionate) .Marland Kitchen..Marland Kitchen Two puffs daily  Problem # 3:  DEGENERATIVE JOINT DISEASE, GENERALIZED (ICD-715.00)  Her updated medication list for this problem includes:    Hydrocodone-acetaminophen 10-500 Mg Tabs (Hydrocodone-acetaminophen) ..... One every 6  hours as needed for pain  Her updated medication list for this problem includes:    Hydrocodone-acetaminophen 10-500 Mg Tabs (Hydrocodone-acetaminophen) ..... One every 6  hours as needed for pain  Complete Medication List: 1)  Alprazolam 2 Mg Tabs (Alprazolam) .... One two times a day and 1/2 once daily 2)  Multivitamins Caps (Multiple vitamin) .... Once daily 3)  Proair Hfa 108 (90 Base) Mcg/act Aers (Albuterol sulfate) .... 2 puffs qid 4)  Qvar 40 Mcg/act Aers (Beclomethasone dipropionate) .... Two puffs daily 5)  Hydrocodone-acetaminophen 10-500 Mg Tabs (Hydrocodone-acetaminophen) .... One every 6  hours  as needed for pain  Patient Instructions: 1)  Please schedule a follow-up appointment in 6 months. 2)  Limit your Sodium (Salt). 3)  Tobacco is very bad for your health and your loved ones! You Should stop smoking!. 4)  It is important  that you exercise regularly at least 20 minutes 5 times a week. If you develop chest pain, have severe difficulty breathing, or feel very tired , stop exercising immediately and seek medical attention. 5)  You need to lose weight. Consider a lower calorie diet and regular exercise.  Prescriptions: HYDROCODONE-ACETAMINOPHEN 10-500 MG TABS (HYDROCODONE-ACETAMINOPHEN) one every 6  hours as needed for pain  #100 x 2   Entered and Authorized by:   Gordy Savers  MD   Signed by:   Gordy Savers  MD on 11/06/2009   Method used:   Print then Give to Patient   RxID:   [id number]

## 2010-11-09 NOTE — Progress Notes (Signed)
Summary: Refill Alprazolam  Phone Note From Pharmacy   [facility]  [place] [address] Summary of Call: Ok to fill Alprazolam ? Llast fill date was 05/06/10 Initial call taken by: Kathrynn Speed CMA,  June 03, 2010 1:17 PM  Follow-up for Phone Call        ok Follow-up by: Gordy Savers  MD,  June 04, 2010 7:59 AM  Additional Follow-up for Phone Call Additional follow up Details #1::        [facility], store # [phone] Additional Follow-up by: Kathrynn Speed CMA,  June 04, 2010 8:34 AM

## 2010-11-09 NOTE — Progress Notes (Signed)
Summary: refill hydrocodne-acetamin and alprazolam  Phone Note Refill Request Call back at [phone]   Refills Requested: Medication #1:  ALPRAZOLAM 2 MG  TABS one two times a day and 1/2 once daily  Medication #2:  HYDROCODONE-ACETAMINOPHEN 10-325 MG TABS 1 by mouth q6hrs as needed pain [facility]  Initial call taken by: Warnell Forester,  July 02, 2010 4:02 PM  Follow-up for Phone Call        OK  #100 each RF 1 Follow-up by: Gordy Savers  MD,  July 05, 2010 8:04 AM    Prescriptions: ALPRAZOLAM 2 MG  TABS (ALPRAZOLAM) one two times a day and 1/2 once daily  #100 x 1   Entered by:   Duard Brady LPN   Authorized by:   Gordy Savers  MD   Signed by:   Duard Brady LPN on 47/82/9562   Method used:   Historical   RxID:   [id number] HYDROCODONE-ACETAMINOPHEN 10-325 MG TABS (HYDROCODONE-ACETAMINOPHEN) 1 by mouth q6hrs as needed pain  #100 x 1   Entered by:   Duard Brady LPN   Authorized by:   Gordy Savers  MD   Signed by:   Duard Brady LPN on 84/13/2440   Method used:   Historical   RxID:   [id number] ALPRAZOLAM 2 MG  TABS (ALPRAZOLAM) one two times a day and 1/2 once daily  #100 x 0   Entered by:   Duard Brady LPN   Authorized by:   Gordy Savers  MD   Signed by:   Duard Brady LPN on 25/95/6387   Method used:   Historical   RxID:   [id number]

## 2010-11-09 NOTE — Progress Notes (Signed)
Summary: refill hydrocodone-acetamin. denied  Phone Note Refill Request Message from:  Fax from Pharmacy on April 29, 2010 3:19 PM  Refills Requested: Medication #1:  HYDROCODONE-ACETAMINOPHEN 10-325 MG TABS 1 by mouth q6hrs as needed pain   Last Ref[facility]: [facility]/2010 [facility]   [phone]   Method Requested: Telephone to Pharmacy Initial call taken by: Duard Brady LPN,  April 29, 2010 3:21 PM  Follow-up for Phone Call        was given #100 04/06/10 - was not recorded - is in a phone note. KIK Follow-up by: Duard Brady LPN,  April 29, 2010 3:24 PM  Additional Follow-up for Phone Call Additional follow up Details #1::        no RF until 05-06-10 Additional Follow-up by: Gordy Savers  MD,  April 29, 2010 5:28 PM

## 2010-11-09 NOTE — Progress Notes (Signed)
Summary: Transfer of care request to Dr Caryl Never  Phone Note Call from Patient   Caller: Patient Call For: Kaitlyn Savers  MD Summary of Call: Pt here for OV, requesting transfer of care from Dr Amador Cunas to Dr Caryl Never Initial call taken by: Sid Falcon LPN,  July 28, 2010 2:52 PM  Follow-up for Phone Call        ok Follow-up by: Kaitlyn Savers  MD,  July 29, 2010 8:02 AM  Additional Follow-up for Phone Call Additional follow up Details #1::        ok with me Additional Follow-up by: Evelena Peat MD,  July 29, 2010 12:21 PM    Additional Follow-up for Phone Call Additional follow up Details #2::    Pt informed Follow-up by: Sid Falcon LPN,  July 29, 2010 4:49 PM

## 2010-11-09 NOTE — Assessment & Plan Note (Signed)
Summary: coughing/chest congestion//ccm   Vital Signs:  Patient profile:   51 year old female Weight:      166 pounds Temp:     98.0 degrees F oral BP sitting:   130 / 82  (left arm) Cuff size:   regular  Vitals Entered By: Sid Falcon LPN (August 06, 2010 1:48 PM)  History of Present Illness: patient seen with persistent cough possibly worse since last week. Chest CT the chest pending to evaluate abnormal chest x-ray. Cough has become productive again of colored sputum. She tried Mucinex DM without much relief. Temperature up to 101 this morning. No hemoptysis. Long history of smoking.  Patient using Symbicort 2 puffs twice daily. Also uses Proair inhaler as needed  Allergies: 1)  Aspirin (Aspirin)  Past History:  Past Medical History: Last updated: 05/08/2008 Panic disorder DJD Low back pain GERD; history of H. pylori Gastritis tobacco abuse COPD  Physical Exam  General:  Well-developed,well-nourished,in no acute distress; alert,appropriate and cooperative throughout examination Ears:  External ear exam shows no significant lesions or deformities.  Otoscopic examination reveals clear canals, tympanic membranes are intact bilaterally without bulging, retraction, inflammation or discharge. Hearing is grossly normal bilaterally. Mouth:  Oral mucosa and oropharynx without lesions or exudates.  Teeth in good repair. Neck:  No deformities, masses, or tenderness noted. Lungs:  patient significant diffuse wheezes. No retractions. No rales Heart:  Normal rate and regular rhythm. S1 and S2 normal without gallop, murmur, click, rub or other extra sounds. Extremities:  no edema   Impression & Recommendations:  Problem # 1:  COUGH (ICD-786.2) patient has likely exacerbation of COPD. Start doxycycline. Plan prednisone taper. Limited Tussionex for nighttime use Evaluation abnormal CXR pending with CT chest.  Complete Medication List: 1)  Alprazolam 2 Mg Tabs (Alprazolam) ....  One two times a day and 1/2 once daily 2)  Multivitamins Caps (Multiple vitamin) .... Once daily 3)  Proair Hfa 108 (90 Base) Mcg/act Aers (Albuterol sulfate) .... 2 puffs qid 4)  Qvar 40 Mcg/act Aers (Beclomethasone dipropionate) .... Two puffs daily 5)  Hydrocodone-acetaminophen 10-325 Mg Tabs (Hydrocodone-acetaminophen) .Marland Kitchen.. 1 by mouth q6hrs as needed pain 6)  Prednisone 10 Mg Tabs (Prednisone) .... Taper as follows:  6-6-4-4-4-3-3-2-2-1-1 7)  Doxycycline Hyclate 100 Mg Caps (Doxycycline hyclate) .... One by mouth two times a day for 10 days 8)  Tussionex Pennkinetic Er 10-8 Mg/79ml Lqcr (Hydrocod polst-chlorphen polst) .... One tsp by mouth q 12 hours as needed cough  Patient Instructions: 1)  Keep appt for CT chest on 08-10-10 Prescriptions: TUSSIONEX PENNKINETIC ER 10-8 MG/5ML LQCR (HYDROCOD POLST-CHLORPHEN POLST) one tsp by mouth q 12 hours as needed cough  #60 ml x 0   Entered and Authorized by:   Evelena Peat MD   Signed by:   Evelena Peat MD on 08/06/2010   Method used:   Print then Give to Patient   RxID:   [id number] DOXYCYCLINE HYCLATE 100 MG CAPS (DOXYCYCLINE HYCLATE) one by mouth two times a day for 10 days  #20 x 0   Entered and Authorized by:   Evelena Peat MD   Signed by:   Evelena Peat MD on 08/06/2010   Method used:   Print then Give to Patient   RxID:   [id number] PREDNISONE 10 MG TABS (PREDNISONE) taper as follows:  6-6-4-4-4-3-3-2-2-1-1  #36 x 0   Entered and Authorized by:   Evelena Peat MD   Signed by:   Evelena Peat MD on 08/06/2010   Method  used:   Print then Give to Patient   RxID:   [id number]    Orders Added: 1)  Est. Patient Level III [14782]

## 2010-11-09 NOTE — Progress Notes (Signed)
Summary: samples of aciphex  Phone Note Call from Patient Call back at Home Phone [phone] Call back at [phone]   Caller: Patient Call For: Kaitlyn Savers  MD Summary of Call: pt needs samples of aciphex 20  mg Initial call taken by: Heron Sabins,  March 15, 2010 10:13 AM  Follow-up for Phone Call        none available at present Follow-up by: Kaitlyn Savers  MD,  March 15, 2010 12:52 PM  Additional Follow-up for Phone Call Additional follow up Details #1::        lmom pt is aware no samples avaiable Additional Follow-up by: Heron Sabins,  March 15, 2010 2:27 PM

## 2010-11-09 NOTE — Progress Notes (Signed)
Summary: hydrocodone-acteaminophen  Phone Note Refill Request   Refills Requested: Medication #1:  HYDROCODONE-ACETAMINOPHEN 10-500 MG TABS one every 6  hours as needed for pain. [facility]    [phone]    fax   [phone]  Initial call taken by: Duard Brady LPN,  January 25, 2010 10:43 AM    Prescriptions: HYDROCODONE-ACETAMINOPHEN 10-500 MG TABS (HYDROCODONE-ACETAMINOPHEN) one every 6  hours as needed for pain  #100 x 2   Entered by:   Duard Brady LPN   Authorized by:   Gordy Savers  MD   Signed by:   Duard Brady LPN on 60/63/0160   Method used:   Historical   RxID:   [id number]  #90 RF 2  called in 90 2rf to [facility]. KIK  Appended Document: hydrocodone-acteaminophen med changed to 10/325 mg , called and cx 10/500 gave new rx for 10/325  to cathy - [facility]. KIK

## 2010-11-09 NOTE — Progress Notes (Signed)
Summary: Alprazolam refill request  Phone Note From Pharmacy   [facility]  [place] [address] Call For: Burchette  Summary of Call: Refill request for Alprazolam, last filled 07/30/2010 Initial call taken by: Sid Falcon LPN,  August 31, 2010 8:45 AM  Follow-up for Phone Call        Pt called to ck on status of refill request.  Follow-up by: Debbra Riding,  August 31, 2010 12:06 PM  Additional Follow-up for Phone Call Additional follow up Details #1::        may refill once. Additional Follow-up by: Evelena Peat MD,  August 31, 2010 1:21 PM    Prescriptions: ALPRAZOLAM 2 MG  TABS (ALPRAZOLAM) one two times a day and 1/2 once daily  #75 x 0   Entered by:   Sid Falcon LPN   Authorized by:   Evelena Peat MD   Signed by:   Sid Falcon LPN on 81/19/1478   Method used:   [facility]phoned to ...       [facility]  [place] [address] [facility]ail[facility]       [address]       [place], [place]  [address]       Ph: [phone] or [phone]       Fax: [phone]   RxID:   [id number] ALPRAZOLAM 2 MG  TABS (ALPRAZOLAM) one two times a day and 1/2 once daily  #30 x 0   Entered by:   Sid Falcon LPN   Authorized by:   Evelena Peat MD   Signed by:   Sid Falcon LPN on 87/56/4332   Method used:   Telephoned to ...       [facility]  [place] [address] (retail)       [address]       [place], [place]  [address]       Ph: [phone] or [phone]       Fax: [phone]   RxID:   [id number]  Called in #30 by mistake, pt takes 2 and 1/2 pills daily.   Called in #75, cancelled the #30 request. Sid Falcon LPN  August 31, 2010 1:30 PM

## 2010-11-09 NOTE — Progress Notes (Signed)
Summary: REQ FOR RETURN CALL re: CXR  Phone Note Call from Patient   Caller: Patient   [phone] Summary of Call: Pt has question ref CXR.... Would like to have a return call when convenient...Marland KitchenMarland Kitchen [phone].  Initial call taken by: Debbra Riding,  July 29, 2010 11:45 AM  Follow-up for Phone Call        pt has ?"s Follow-up by: Heron Sabins,  July 29, 2010 12:21 PM  Additional Follow-up for Phone Call Additional follow up Details #1::        Pt informed and she will call back in 2 weeks for repeat CXR order Additional Follow-up by: Sid Falcon LPN,  July 29, 2010 2:56 PM

## 2010-11-09 NOTE — Miscellaneous (Signed)
Summary: Controlled Substances Contract   Controlled Substances Contract   Imported By: Maryln Gottron 07/22/2010 13:38:54  _____________________________________________________________________  External Attachment:    Type:   Image     Comment:   External Document

## 2010-11-09 NOTE — Progress Notes (Signed)
Summary: med refill  Phone Note Call from Patient Call back at Home Phone [phone] Call back at [phone]   Caller: Patient Call For: Gordy Savers  MD Summary of Call: pt needs refill on hydrocodone [facility] [phone] Initial call taken by: Heron Sabins,  November 04, 2009 11:54 AM    no RF until 11-17-2009

## 2010-11-09 NOTE — Progress Notes (Signed)
Summary: Xanax and Hydrocodone  Phone Note Refill Request Message from:  Fax from Pharmacy on May 05, 2010 12:03 PM  Refills Requested: Medication #1:  ALPRAZOLAM 2 MG  TABS one two times a day and 1/2 once daily   Last Refilled: 04/06/2010 [facility]     [phone]   Method Requested: Telephone to Pharmacy Initial call taken by: Duard Brady LPN,  May 05, 2010 12:04 PM Caller: Patient Call For: Gordy Savers  MD Summary of Call: Pt needs Xanax and Hyrdococone filled tomorrow, and wanted to let Selena Batten know. [phone] Initial call taken by: Lynann Beaver CMA,  May 05, 2010 11:20 AM  Follow-up for Phone Call        ok Follow-up by: Gordy Savers  MD,  May 06, 2010 8:06 AM  Additional Follow-up for Phone Call Additional follow up Details #1::        [facility]. KIK Additional Follow-up by: Duard Brady LPN,  May 06, 2010 10:14 AM    Prescriptions: HYDROCODONE-ACETAMINOPHEN 10-325 MG TABS (HYDROCODONE-ACETAMINOPHEN) 1 by mouth q6hrs as needed pain  #100 x 1   Entered by:   Duard Brady LPN   Authorized by:   Gordy Savers  MD   Signed by:   Duard Brady LPN on 96/29/5284   Method used:   Historical   RxID:   [id number] ALPRAZOLAM 2 MG  TABS (ALPRAZOLAM) one two times a day and 1/2 once daily  #100 x 0   Entered by:   Duard Brady LPN   Authorized by:   Gordy Savers  MD   Signed by:   Duard Brady LPN on 40/34/7425   Method used:   Historical   RxID:   [id number]

## 2010-11-09 NOTE — Progress Notes (Signed)
Summary: not better  Phone Note Call from Patient Call back at [phone]   Caller: vm Summary of Call: No better after taking up antibiotics Thurs, Avelox,& prednisone.  Cannot afford to come in again.  Asthmatic bronchitis.  What to do?  Hoarse, hard to talk.  Coughing.   Bad headache.  Can't sleep due to cough.   Feel awful.  Using inhaler & mom's neb & albuterol.  No fever.  Wheezing & chest tightness.  No help with the meds. Feels really bad.  Throat feels like it had bumps, none actually there, felt strained.  Has cut back on smoking, maybe 3 day.   [facility].  NKDA. Rudy Jew, RN  July 26, 2010 9:30 AM     Initial call taken by: Rudy Jew, RN,  July 26, 2010 9:14 AM  Follow-up for Phone Call        continue same;  and d/c all tobacco; take pain meds as directed  Follow-up by: Gordy Savers  MD,  July 26, 2010 1:01 PM  Additional Follow-up for Phone Call Additional follow up Details #1::        Phone Call Completed Additional Follow-up by: Rudy Jew, RN,  July 26, 2010 1:51 PM

## 2010-11-09 NOTE — Progress Notes (Signed)
Summary: refill alprazolam , hydrocodone-acetamin.  Phone Note Refill Request Message from:  Fax from Pharmacy on April 05, 2010 11:41 AM  Refills Requested: Medication #1:  ALPRAZOLAM 2 MG  TABS one two times a day and 1/2 once daily   Last Refilled: 03/12/2010  Medication #2:  HYDROCODONE-ACETAMINOPHEN 10-325 MG TABS 1 by mouth q6hrs as needed pain   Last Refilled: 03/12/2010  Method Requested: Telephone to Pharmacy Initial call taken by: Duard Brady LPN,  April 05, 2010 11:42 AM  Follow-up for Phone Call        #100 each Follow-up by: Gordy Savers  MD,  April 05, 2010 6:19 PM  Additional Follow-up for Phone Call Additional follow up Details #1::        [facility] Additional Follow-up by: Duard Brady LPN,  April 06, 2010 8:57 AM    Prescriptions: HYDROCODONE-ACETAMINOPHEN 10-500 MG TABS (HYDROCODONE-ACETAMINOPHEN) one every 6  hours as needed for pain  #100 x 0   Entered by:   Duard Brady LPN   Authorized by:   Gordy Savers  MD   Signed by:   Duard Brady LPN on 16/07/9603   Method used:   Historical   RxID:   [id number] ALPRAZOLAM 2 MG  TABS (ALPRAZOLAM) one two times a day and 1/2 once daily  #100 x 0   Entered by:   Duard Brady LPN   Authorized by:   Gordy Savers  MD   Signed by:   Duard Brady LPN on 21/30/8657   Method used:   Historical   RxID:   [id number]

## 2010-11-09 NOTE — Progress Notes (Signed)
Summary: pt no better would like ct scan of chest  Phone Note Call from Patient Call back at [phone]   Caller: Patient Call For: Evelena Peat MD Summary of Call: pt had chest xray about 8 days ago she would like to precede to ct scan of chest. Pt is no better Initial call taken by: Heron Sabins,  August 05, 2010 9:23 AM  Follow-up for Phone Call        set up CT chest with contrast.   Will order. Let pt know we have put in order. Follow-up by: Evelena Peat MD,  August 05, 2010 1:01 PM  Additional Follow-up for Phone Call Additional follow up Details #1::        pt is aware ct will be ordered Additional Follow-up by: Heron Sabins,  August 05, 2010 1:06 PM  New Problems: ABNORMAL CHEST XRAY (ICD-793.1)   New Problems: ABNORMAL CHEST XRAY (ICD-793.1)

## 2010-11-11 NOTE — Progress Notes (Signed)
Summary: Alprazolam refill request  Phone Note From Pharmacy   [facility]  [place] [address] Call For: burchette  Summary of Call: Rx request from pharmacy refill Alprazolam 2mg , #75 no refills,  1last filled 09-27-2010.  Sig 1 tab two times a day and 1/2 tab daily Initial call taken by: Sid Falcon LPN,  October 26, 2010 2:11 PM  Follow-up for Phone Call        refill for 3 months Follow-up by: Evelena Peat MD,  October 26, 2010 5:19 PM    Prescriptions: ALPRAZOLAM 2 MG  TABS (ALPRAZOLAM) one two times a day and 1/2 once daily  #75 x 2   Entered by:   Duard Brady LPN   Authorized by:   Evelena Peat MD   Signed by:   Duard Brady LPN on 96/01/5408   Method used:   Historical   RxID:   [id number]

## 2010-11-11 NOTE — Progress Notes (Signed)
Summary: Alprazolam refill request  Phone Note From Pharmacy   [facility]  [place] [address] Call For: [name]  Summary of Call: Alprazolam refill request received from [facility] Initial call taken by: Sid Falcon LPN,  September 27, 2010 3:03 PM  Follow-up for Phone Call        may refill once. Follow-up by: Evelena Peat MD,  September 27, 2010 5:36 PM    Prescriptions: ALPRAZOLAM 2 MG  TABS (ALPRAZOLAM) one two times a day and 1/2 once daily  #75 x 0   Entered by:   Sid Falcon LPN   Authorized by:   Evelena Peat MD   Signed by:   Sid Falcon LPN on 16/07/9603   Method used:   Telephoned to ...       [facility]  [place] [address] (retail)       [address]       [place], [place]  [address]       Ph: [phone] or [phone]       Fax: [phone]   RxID:   [id number]

## 2010-11-11 NOTE — Assessment & Plan Note (Signed)
Summary: fu on meds/njr   Vital Signs:  Patient profile:   50 year old female Weight:      164 pounds Temp:     98.1 degrees F oral BP sitting:   110 / 70  (left arm) Cuff size:   regular  Vitals Entered By: Sid Falcon LPN (September 24, 2010 11:23 AM)  History of Present Illness: Patient seen for medical followup. Ongoing nicotine use. Reported COPD. No history of spirometry and patient has no current insurance coverage. Daily cough. Mostly nonproductive. No hemoptysis. Recent CT of chest showed no acute findings.  Chronic pain syndrome. Takes oxycodone for several years for chronic low back pain. Fairly well-controlled with 3 tablets daily. Occasionally takes 4 daily. No relief with over-the-counter medications. Intermittent right lower extremity radiculopathy symptoms.  Allergies: 1)  Aspirin (Aspirin)  Past History:  Past Medical History: Last updated: 05/08/2008 Panic disorder DJD Low back pain GERD; history of H. pylori Gastritis tobacco abuse COPD  Past Surgical History: Last updated: 07/21/2010 Appendectomy Oophorectomy gravida one, para one, aborta zero coloscopy 200  Family History: Last updated: 2007-09-15 father died age 36, lung cancer.  History:coronary artery disease mother 66 history of diabetes one brother one sister bothare disabled  Social History: Last updated: 03/14/2008 Single Current Smoker no insurance  Risk Factors: Smoking Status: current (07/28/2010) Packs/Day: 1.0 (07/28/2010) PMH-FH-SH reviewed for relevance  Review of Systems  The patient denies anorexia, fever, weight loss, chest pain, syncope, peripheral edema, prolonged cough, and hemoptysis.    Physical Exam  General:  Well-developed,well-nourished,in no acute distress; alert,appropriate and cooperative throughout examination Neck:  No deformities, masses, or tenderness noted. Lungs:  patient has some diffuse expiratory wheezes in the lung bases but no rales.  Symmetric breath sounds Heart:  Normal rate and regular rhythm. S1 and S2 normal without gallop, murmur, click, rub or other extra sounds. Extremities:  no edema.  Neg SLRs.  Neurologic:  alert & oriented X3, cranial nerves II-XII intact, and strength normal in all extremities.     Impression & Recommendations:  Problem # 1:  COPD (ICD-496) patient really needs spirometry but no insurance coverage. Also needs Pneumovax and influenza vaccine but declines PVX.  Does consent to flu vaccine. Trial Spiriva one daily with samples given.  She is strongly encouraged to stop smoking. Her updated medication list for this problem includes:    Proair Hfa 108 (90 Base) Mcg/act Aers (Albuterol sulfate) .Marland Kitchen... 2 puffs qid    Qvar 40 Mcg/act Aers (Beclomethasone dipropionate) .Marland Kitchen..Marland Kitchen Two puffs daily  Problem # 2:  DEGENERATIVE JOINT DISEASE, GENERALIZED (ICD-715.00) refill meds. Her updated medication list for this problem includes:    Hydrocodone-acetaminophen 10-325 Mg Tabs (Hydrocodone-acetaminophen) .Marland Kitchen... 1 by mouth q6hrs as needed pain  Complete Medication List: 1)  Alprazolam 2 Mg Tabs (Alprazolam) .... One two times a day and 1/2 once daily 2)  Multivitamins Caps (Multiple vitamin) .... Once daily 3)  Proair Hfa 108 (90 Base) Mcg/act Aers (Albuterol sulfate) .... 2 puffs qid 4)  Qvar 40 Mcg/act Aers (Beclomethasone dipropionate) .... Two puffs daily 5)  Hydrocodone-acetaminophen 10-325 Mg Tabs (Hydrocodone-acetaminophen) .Marland Kitchen.. 1 by mouth q6hrs as needed pain 6)  Prednisone 10 Mg Tabs (Prednisone) .... Taper as follows:  6-6-4-4-4-3-3-2-2-1-1 7)  Tussionex Pennkinetic Er 10-8 Mg/62ml Lqcr (Hydrocod polst-chlorphen polst) .... One tsp by mouth q 12 hours as needed cough  Other Orders: Admin 1st Vaccine (60454) Flu Vaccine 24yrs + (09811)  Patient Instructions: 1)  Spiriva one puff once daily Prescriptions:  HYDROCODONE-ACETAMINOPHEN 10-325 MG TABS (HYDROCODONE-ACETAMINOPHEN) 1 by mouth q6hrs as  needed pain  #90 x 3   Entered and Authorized by:   Evelena Peat MD   Signed by:   Evelena Peat MD on 09/24/2010   Method used:   Print then Give to Patient   RxID:   [id number]    Orders Added: 1)  Admin 1st Vaccine [90471] 2)  Flu Vaccine 41yrs + [65784] 3)  Est. Patient Level III [69629]   Flu Vaccine Consent Questions     Do you have a history of severe allergic reactions to this vaccine? no    Any prior history of allergic reactions to egg and/or gelatin? no    Do you have a sensitivity to the preservative Thimersol? no    Do you have a past history of Guillan-Barre Syndrome? no    Do you currently have an acute febrile illness? no    Have you ever had a severe reaction to latex? no    Vaccine information given and explained to patient? yes    Are you currently pregnant? no    Lot Number:[id number]   Exp Date:04/09/2011   Site Given  Left Deltoid IM         .lbflu

## 2010-11-11 NOTE — Progress Notes (Signed)
Summary: Hydrocodone refill request, denied, pt needs OV  Phone Note From Pharmacy   Caller: [facility]  [address] Summary of Call: faxed request to fill Hydrocodone.  Per 11/17 request, only #90 approved until OV.  Pt does not have appt scheduled at this time.  Message left on personally identified cell phone she needs return OV, no refills until then Initial call taken by: Sid Falcon LPN,  September 21, 2010 3:29 PM

## 2010-11-26 ENCOUNTER — Telehealth: Payer: Self-pay | Admitting: *Deleted

## 2010-11-26 NOTE — Telephone Encounter (Deleted)
Faxed request to fill alprazolam 2 mg, take 1 tab and 1/2 tab daily. Refills were already done on 1/17 for 3 months, spoke with Ray at pt pharmacy

## 2010-11-26 NOTE — Telephone Encounter (Signed)
Duplicate request

## 2010-12-21 LAB — POCT PREGNANCY, URINE
Preg Test, Ur: NEGATIVE
Preg Test, Ur: NEGATIVE

## 2010-12-22 ENCOUNTER — Encounter: Payer: Self-pay | Admitting: Family Medicine

## 2010-12-22 ENCOUNTER — Ambulatory Visit (INDEPENDENT_AMBULATORY_CARE_PROVIDER_SITE_OTHER): Payer: Self-pay | Admitting: Family Medicine

## 2010-12-22 DIAGNOSIS — J449 Chronic obstructive pulmonary disease, unspecified: Secondary | ICD-10-CM

## 2010-12-22 DIAGNOSIS — K219 Gastro-esophageal reflux disease without esophagitis: Secondary | ICD-10-CM

## 2010-12-22 DIAGNOSIS — M542 Cervicalgia: Secondary | ICD-10-CM

## 2010-12-22 DIAGNOSIS — M545 Low back pain, unspecified: Secondary | ICD-10-CM

## 2010-12-22 LAB — DIFFERENTIAL
Basophils Absolute: 0 10*3/uL (ref 0.0–0.1)
Basophils Relative: 0 % (ref 0–1)
Eosinophils Absolute: 0.1 10*3/uL (ref 0.0–0.7)
Eosinophils Relative: 1 % (ref 0–5)
Lymphocytes Relative: 30 % (ref 12–46)
Lymphs Abs: 3.9 10*3/uL (ref 0.7–4.0)
Monocytes Absolute: 0.7 10*3/uL (ref 0.1–1.0)
Monocytes Relative: 5 % (ref 3–12)
Neutro Abs: 8.5 10*3/uL — ABNORMAL HIGH (ref 1.7–7.7)
Neutrophils Relative %: 64 % (ref 43–77)

## 2010-12-22 LAB — CBC
HCT: 38.3 % (ref 36.0–46.0)
Hemoglobin: 13.4 g/dL (ref 12.0–15.0)
MCH: 33.9 pg (ref 26.0–34.0)
MCHC: 35 g/dL (ref 30.0–36.0)
MCV: 96.8 fL (ref 78.0–100.0)
Platelets: 257 10*3/uL (ref 150–400)
RBC: 3.95 MIL/uL (ref 3.87–5.11)
RDW: 15.1 % (ref 11.5–15.5)
WBC: 13.2 10*3/uL — ABNORMAL HIGH (ref 4.0–10.5)

## 2010-12-22 LAB — URINALYSIS, ROUTINE W REFLEX MICROSCOPIC
Bilirubin Urine: NEGATIVE
Glucose, UA: NEGATIVE mg/dL
Hgb urine dipstick: NEGATIVE
Ketones, ur: NEGATIVE mg/dL
Nitrite: NEGATIVE
Protein, ur: NEGATIVE mg/dL
Specific Gravity, Urine: <1.005 — ABNORMAL LOW (ref 1.005–1.030)
Urobilinogen, UA: 0.2 mg/dL (ref 0.0–1.0)
pH: 6 (ref 5.0–8.0)

## 2010-12-22 LAB — GC/CHLAMYDIA PROBE AMP, GENITAL: Chlamydia, DNA Probe: NEGATIVE

## 2010-12-22 LAB — WET PREP, GENITAL

## 2010-12-22 MED ORDER — OXYCODONE HCL 10 MG PO TABS: 1.0000 | ORAL_TABLET | Freq: Four times a day (QID) | ORAL | Status: DC | PRN

## 2010-12-22 NOTE — Progress Notes (Signed)
  Subjective:    Patient ID: [name], female    DOB: May 03, 1961, 50 y.o.   MRN: [id number]  HPI  patient seen for the following issues.    Several year history of chronic pain involving neck and low back. Recently back has been very poorly controlled. Hydrocodone for several years 10 mg 4 times a day. Still has frequent pain 8-9 out 10 severity. Occasional left right-sided radiculopathy symptoms. No incontinence. No numbness or weakness. Pain exacerbated by movement.   Chronic anxiety on Xanax. Has been on this for several years as well. Questionable panic disorder.   COPD. Ongoing nicotine use. Has used Spiriva once daily with good success. Not using steroid inhaler regularly.   Review of Systems  Constitutional: Negative for fever, chills, appetite change and fatigue.  Respiratory: Negative for cough and shortness of breath.   Cardiovascular: Negative for chest pain.  Gastrointestinal: Negative for abdominal pain.  Genitourinary: Negative for hematuria.  Musculoskeletal: Positive for back pain and arthralgias.  Neurological: Negative for dizziness and syncope.  Psychiatric/Behavioral: Negative for dysphoric mood.       Objective:   Physical Exam  patient is alert in no distress. He is afebrile  Oropharynx is clear Eardrums no acute change Neck supple no adenopathy Chest slightly diminished breath sounds throughout no wheezes or rales Heart regular rhythm and rate   back no localizing tenderness. Straight leg raises negative. Deep reflexes 2+ ankle and knee. No focal strength deficits lower extremity. Normal sensory function to touch.       Assessment & Plan:   #1 chronic back pain. Discussed options. Poor pain control with hydrocodone. Try transitioning to oxycodone 10 mg 4 times a day when necessary. Discussed physical therapy but she has no insurance and she can't afford at this time.   #2 chronic anxiety  #3 COPD history

## 2011-01-14 LAB — COMPREHENSIVE METABOLIC PANEL
ALT: 20 U/L (ref 0–35)
AST: 20 U/L (ref 0–37)
Alkaline Phosphatase: 81 U/L (ref 39–117)
CO2: 28 mEq/L (ref 19–32)
Chloride: 101 mEq/L (ref 96–112)
GFR calc Af Amer: >60 mL/min (ref >60)
GFR calc non Af Amer: >60 mL/min (ref >60)
Glucose, Bld: 103 mg/dL — ABNORMAL HIGH (ref 70–99)
Sodium: 135 mEq/L (ref 135–145)
Total Bilirubin: 0.4 mg/dL (ref 0.3–1.2)

## 2011-01-14 LAB — URINALYSIS, ROUTINE W REFLEX MICROSCOPIC
Glucose, UA: NEGATIVE mg/dL
Ketones, ur: NEGATIVE mg/dL
Specific Gravity, Urine: >1.03 — ABNORMAL HIGH (ref 1.005–1.030)
pH: 5.5 (ref 5.0–8.0)

## 2011-01-14 LAB — WET PREP, GENITAL
Trich, Wet Prep: NONE SEEN
Yeast Wet Prep HPF POC: NONE SEEN

## 2011-01-19 ENCOUNTER — Telehealth: Payer: Self-pay | Admitting: Family Medicine

## 2011-01-19 DIAGNOSIS — M545 Low back pain, unspecified: Secondary | ICD-10-CM

## 2011-01-19 DIAGNOSIS — M542 Cervicalgia: Secondary | ICD-10-CM

## 2011-01-19 NOTE — Telephone Encounter (Signed)
May refill by Friday.

## 2011-01-19 NOTE — Telephone Encounter (Signed)
Also received faxed refill request for Alprazolam 2 mg tab,  2 and 1/2 tabs daily #75 with 2 refill given 10/27/10 Oxycodone #90 with 3 refills given on 09/24/10, sig one tab every 6 hours prn pain

## 2011-01-19 NOTE — Telephone Encounter (Signed)
Refill Oxycodone. Can pick up this Friday. Her refill date is on this Saturday. Please call her when ready.

## 2011-01-21 MED ORDER — ALPRAZOLAM 2 MG PO TABS: ORAL_TABLET | ORAL | Status: DC

## 2011-01-21 MED ORDER — OXYCODONE HCL 10 MG PO TABS: 1.0000 | ORAL_TABLET | Freq: Four times a day (QID) | ORAL | Status: DC | PRN

## 2011-01-21 NOTE — Telephone Encounter (Signed)
Rx printed, signed and pt plans to pick up this afternoon

## 2011-01-21 NOTE — Telephone Encounter (Signed)
Pt would like to pick up med today 01-21-11. Around 4 pm

## 2011-01-24 ENCOUNTER — Telehealth: Payer: Self-pay | Admitting: Family Medicine

## 2011-01-24 MED ORDER — ALPRAZOLAM 2 MG PO TABS: ORAL_TABLET | ORAL | Status: DC

## 2011-01-24 NOTE — Telephone Encounter (Signed)
Pt informed on personally identified VM 

## 2011-01-24 NOTE — Telephone Encounter (Signed)
Correct dose should be for 2 and one half tablets per day.  She takes this divided-one po q 8 hours (3rd dose is one half tablet).  Correct disp per month should be #75.

## 2011-01-24 NOTE — Telephone Encounter (Signed)
Picked up rx for Xanax on Friday. Was given 45 pills instead of 75 pills. Please advise and return her call

## 2011-02-16 ENCOUNTER — Other Ambulatory Visit: Payer: Self-pay | Admitting: Family Medicine

## 2011-02-16 DIAGNOSIS — M545 Low back pain, unspecified: Secondary | ICD-10-CM

## 2011-02-16 DIAGNOSIS — M542 Cervicalgia: Secondary | ICD-10-CM

## 2011-02-16 NOTE — Telephone Encounter (Signed)
Refill Oxycodone 10 mg. Due to be filled this Sunday, but would like to pickup rx here on Friday.

## 2011-02-17 NOTE — Telephone Encounter (Signed)
OK to refill Friday. 

## 2011-02-18 MED ORDER — OXYCODONE HCL 10 MG PO TABS: 1.0000 | ORAL_TABLET | Freq: Four times a day (QID) | ORAL | Status: DC | PRN

## 2011-02-18 NOTE — Telephone Encounter (Signed)
Rx printed, pt informed ready for pick-up

## 2011-02-18 NOTE — Telephone Encounter (Signed)
Pt called back to check on status of refill for Oxycodone. Pt would like to come by today after 4pm to pick up.

## 2011-02-22 NOTE — Group Therapy Note (Signed)
NAMEBRENT, NOTO NO.:  [id number]   MEDICAL RECORD NO.:  [id number]          PATIENT TYPE:  WOC   LOCATION:  [facility]                   FACILITY:  [facility]   PHYSICIAN:  Argentina Donovan, MD        DATE OF BIRTH:  Sep 22, 1961   DATE OF SERVICE:  08/21/2008                                  CLINIC NOTE   The patient is a 50 year old Caucasian female, gravida 1, para 1001 who  is about 5 years postmenopausal in for her annual examination.  She has  developed in the last month severe pain in the right lower quadrant  markedly aggravated by coitus, both during and post and with any  radiation down into her right leg.  She has degenerative lumbar disks  that have been giving her problems for some time and also complains of  suprapubic boils that she has had recently.  Her mother has a history of  diabetes, but she does not think she is a diabetic.  The patient has not  been on any hormone therapy.  She takes Xanax occasionally in the  evening, but nothing else and would like something for pain for this  lower quadrant pain.   EXAMINATION:  BREASTS:  Symmetrical with no dominant masses.  No supraclavicular, or axillary nodes palpable.  ABDOMEN:  Soft, flat, nontender except in the lower right quadrant to  deep palpation, but no guarding or rebound and no palpable evidence of  herniation, either in a prone or standing position.  EXTERNAL GENITALIA:  Normal although atrophic to a great degree.  Introitus is atrophic.  Vagina is atrophic and clean.  The cervix is  atrophic.  A Pap smear was taken and the uterus is palpable anterior, it  does not seem to be enlarged.  The adnexa seems full on both sides, but  I can outline any real mass and the rectal exam was confirmatory.  In  addition to this, the patient has a 1-1/2 cm spherical nodule in the  posterior portion of the left buttocks halfway between the lateral and  buttocks division.  It seems to be very geometrical.  It  seems firm,  like a marble more than a typical lipoma and I am sure that when she  sits on that area it is uncomfortable.  It does not seem to be have any  attachment to tissue, except for the deepest portion of it and it cannot  be moved up or down, but it can easily be mobilized.  I have ordered on  the patient, and we have done a Pap smear, getting a hemoglobin A1c and  random glucose, getting a pelvic ultrasound to evaluate that right lower  quadrant in this postmenopausal patient and we are going to have her  come back in 2 weeks to evaluate this.   IMPRESSION:  1. Right lower quadrant pain, recurrent.  2. Furuncles suprapubically.  3. Firm nodule in the right buttocks.           ______________________________  Argentina Donovan, MD     PR/MEDQ  D:  08/21/2008  T:  08/21/2008  Job:  [id number]

## 2011-02-25 NOTE — Group Therapy Note (Signed)
NAMECAYLA, [name] NO.:  [id number]   MEDICAL RECORD NO.:  [id number]          PATIENT TYPE:  WOC   LOCATION:  [facility]                   FACILITY:  [facility]   PHYSICIAN:  Kaitlyn Lincoln, MD      DATE OF BIRTH:  1960-12-08   DATE OF SERVICE:  08/10/2004                                    CLINIC NOTE   REASON FOR VISIT:  The patient is a 50 year old G1 para 1-0-0-1 female who  presented to the [facility] on July 10, 2004 with abdominal pain in the left  lower quadrant.  The patient said that she has been having this pain for  several months and had been followed by her M.D. in [place] who had  ordered a CT of the gallbladder and abdomen.  The patient went ahead and had  that scan here, which showed a hepatic granulomata with no acute abdominal  abnormalities.  The CT of the pelvis showed questionable bilateral ovarian  cysts, larger on the right, with no acute pelvic process.  The patient then  had a subsequent transvaginal ultrasound to evaluate the adnexa further.  This revealed a right ovary that is 3.2 x 1.7 x 2.4 with a right ovarian  cyst of 2.9 x 1.3 x 1.6.  There was also fluid seen in the right fallopian  tube.  The left ovary measured 2 x 1.6 x 1.9 and fluid is also seen in the  left fallopian tube.  They had an impression of bilateral hydrosalpinx with  a right ovarian cyst with no acute process.  The patient states since this  visit to the [facility]U, the patient has no more abdominal pain.  She had received  Toradol for pain which had resolved the issue.  The patient also said she  was diagnosed with bacterial vaginosis and was given metronidazole.  Her  white blood cell count on that day was 9.5.  Today, the patient presents for  follow-up of this abdominal pain, which is no longer there, and also for Pap  smear.  The patient states she has had multiple abnormal Pap smears and had  freezing of the cervix at least three times by Dr. __________.  However, she  has not had a Pap smear for several years, cannot remember exactly when.  The patient denies any severe abdominal pain, nausea, vomiting, diarrhea, or  change in urinary habits.   PAST MEDICAL HISTORY:  She was diagnosed with hepatitis B when she was 50  years old and had severe jaundice.  She is being followed by Dr. Sherwood Gambler in  [place] for this matter.  She also has a history of anxiety and believes  she probably is depressed at this point.  She does take daily Xanax that was  prescribed by Dr. Sherwood Gambler.  She denies suicidal ideation today; however, she  will address the issue of going on SSRI with Dr. Sherwood Gambler.   SURGICAL HISTORY:  Right ovarian cystectomy and appendectomy 17 years ago  for abdominal pain.   OBSTETRICAL HISTORY:  She had one NSVD 23 years ago.  She has had multiple  abnormal Paps which  are described above.  She had gonorrhea when she was 50  years old and then a florid PID at age 46.  She has never had a mammogram.  She denies any history of fibroid tumors.  She is going through menopause,  with her LMP in August 2005.  She has hot flashes and flushes and decreased  libido but she has never gone longer than a year without a menses.   ALLERGIES:  No known drug allergies.   MEDICATIONS:  Xanax.   PHYSICAL EXAMINATION:  VITAL SIGNS:  Temperature 97, pulse 93, blood  pressure 131/86, weight 177, height 5 feet 6 inches.  BREASTS:  No masses, no skin changes, no lymphadenopathy.  ABDOMEN:  Soft, nontender, nondistended.  No rebound, no guarding.  PELVIC:  Genitalia:  Tanner V.  Vagina:  Pink, no lesions.  Cervix:  Closed,  nontender.  Uterus:  Anteverted, small.  Adnexa deeply palpated with no  masses or tenderness.  Both ovaries palpated and not noted to be markedly  enlarged.   ASSESSMENT AND PLAN:  A 50 year old female with resolved abdominal pain, a  right ovarian cyst, and needing a gynecologic exam.   1.  Pap smear done today.  GC and chlamydia were negative this  month.  2.  Mammogram ordered.  3.  Transvaginal ultrasound in 4 weeks to evaluate adnexa to see if right      ovarian cyst has resolved.  4.  HIV test today as the patient has multiple STDs and unable to clear HPV.  5.  Smoking cessation encouraged.  6.  Follow up with primary medical doctor for addressing depression.  7.  Return to clinic in 4-6 weeks.      KL/MEDQ  D:  08/10/2004  T:  08/10/2004  Job:  [id number]

## 2011-02-25 NOTE — Assessment & Plan Note (Signed)
[facility]                              [facility] OFFICE NOTE   NAME:[name]                      MRN:          [id number]  DATE:07/21/2006                            DOB:          09-23-1961   A 50 year old female is seen today to establish with our practice.  She is a  former patient of Dr. Sherwood Gambler.  At the present time, she is taking no  medications.  She has a history of panic disorder and has been out of Xanax.  She also states that she takes Percocet p.r.n. for back and leg pain.  She  does present with office records and has had MRIs and radiographs.  She  states that she has been on a number of other analgesics, which have been  ineffective or not been well tolerated.  She has a history of  gastroesophageal reflux disease and a history of H. pylori treated  gastritis.  She is a gravida 1, para 1, abortus 0.  She has had a remote  appendectomy with an oophorectomy.   REVIEW OF SYSTEMS:  She has received a recent gynecologic check and  mammogram.  Has had a colonoscopy and upper endoscopy in November of 2004.   SOCIAL HISTORY:  She is single, 1 daughter, 2 grandchildren.   FAMILY HISTORY:  Father age 73, died of lung cancer.  Apparently had a  history of coronary artery disease.  Mother age 61 has diabetes.  One  brother status post CVA on disability, and 1 sister also on disability,  apparently due to severe carpal tunnel syndrome.  Paternal aunts and uncles  strongly positive for cancer -- breast, gastric.   EXAM:  Healthy-appearing female in no acute distress.  Blood pressure 120/80.  SKIN:  Negative.  FUNDI, EARS, NOSE, AND THROAT:  Clear.  NECK:  No adenopathy or thyroid enlargement.  CHEST:  Clear.  CARDIOVASCULAR:  Normal heart sounds.  No murmurs.  ABDOMEN:  Benign.  No organomegaly.  EXTREMITIES:  Negative.  Full peripheral pulses.   IMPRESSION:  1. Panic disorder.  2. History of degenerative joint disease and  intermittent back and leg      pain.   DISPOSITION:  Have refilled her Xanax with maximum dose of 60 per month 2 mg  b.i.d. p.r.n.  She has been on twice this dose.  Will give her a  prescription for Talwin Nx also to take p.r.n.            ______________________________  Gordy Savers, MD   PFK/MedQ DD:  07/21/2006 DT:  07/23/2006 Job #:  [id number]

## 2011-02-25 NOTE — Group Therapy Note (Signed)
[name], [name] NO.:  [id number]   MEDICAL RECORD NO.:  [id number]          PATIENT TYPE:  WOC   LOCATION:  [facility]                   FACILITY:  [facility]   PHYSICIAN:  Kaitlyn Lincoln, MD      DATE OF BIRTH:  [birth year]   DATE OF SERVICE:  09/07/2004                                    CLINIC NOTE   Patient is a 50 year old para 1-0-0-1 female who presented with left lower  quadrant in the [facility]U July 10, 2004.  Patient was noted to have bilateral  hydrosalpinges and a right ovarian cyst.  Patient presents today with no  chronic abdominal pain.  However, she got her period for the first time in  several months and is having a lot of emotional mood swings and cramping  from this and wants to know what can be done.  Her repeat sonogram today  reveals (this is a preliminary report) that there is bilateral  hydrosalpinges.  However, the right ovarian cyst is now resolved.  We had a  lengthy discussion with patient about her history of depression and how she  is not taking her medication.  She was supposed to go to see Dr. Sherwood [name] to  restart her antidepressant.  However, she did not do this.  She is tearful  at times, has problems sleeping, and has not a lot of interest in the  activities she used to find fun.  Patient today thinks that she should go on  the antidepressant and agrees to follow up with Dr. Sherwood [name].  The patient is  also hesitant to take any pain medication for her periods secondary to  hepatitis B.  Her internist says she is able to take medications, however,  she just does not.  Patient prefers to take ibuprofen to help with the  cramping.   ASSESSMENT/PLAN:  A 50 year old female for follow-up right ovarian cyst that  is now resolved.  1.  Follow up with Dr. Sherwood [name] for depression.  2.  Ibuprofen for dysmenorrhea.  3.  Pap smear was normal.  4.  Smoking cessation.  5.  Mammogram is scheduled for December 9.  6.  Return to clinic in three to four  months.      KL/MEDQ  D:  09/07/2004  T:  09/08/2004  Job:  [id number]

## 2011-02-25 NOTE — Consult Note (Signed)
NAMEPEGGI, YONO                         ACCOUNT NO.:  [id number]   MEDICAL RECORD NO.:  [id number]                   PATIENT TYPE:   LOCATION:                                       FACILITY:   PHYSICIAN:  [name], M.D.                 DATE OF BIRTH:  1961-08-11   DATE OF CONSULTATION:  08/12/2003  DATE OF DISCHARGE:                                   CONSULTATION   REFERRING PHYSICIAN:  Madelin Rear. Sherwood Gambler, M.D.   REASON FOR CONSULTATION:  Blood in stool.   HISTORY OF PRESENT ILLNESS:  [name] is a 50 year old Caucasian female of Dr.  Sharyon Medicus who presents today for further evaluation of the above stated  symptoms.  She states a couple of months ago she developed several episodes  of melena.  She went to Dr. Sherwood Gambler and was found to have heme positive stools  on rectal examination.  Because of insurance she did not want to have any  further workup at that time but did have blood work, which revealed,  according to her, normal hemoglobin and hematocrit.  She also had positive  H. pylori and did undergo treatment.  She continued to have melena, although  she was on Pepto-Bismol with H. pylori treatment.  She was told to go to the  ED.  She states she was evaluated but released.  Several weeks later she did  develop a couple of episodes of fresh blood per rectum.  She has not seen  any since.  She has since seen some melena, however.  She complains of  epigastric pain.  She has daily heartburn that has been bad over the last  three months.  She is using Rolaids p.r.n.  She has a good appetite.  She  denies any nausea and vomiting.  She has had some solid food dysphasia  intermittently.  She gained about 15 pounds over the last year.  She takes  Goody's Powders regularly.  She generally takes three-four a day, although  most recently has tried to limit this to one daily.   CURRENT MEDICATIONS:  1. Xanax 1 mg b.i.d./t.i.d.  2. Hydrocodone p.r.n.  3. Goody's Powders previously  3-4 a day, currently taking 1 daily.  4. Rolaids p.r.n.   ALLERGIES:  No known drug allergies.   PAST MEDICAL HISTORY:  1. Diagnosed with suspected of having peptic ulcer disease at age 50 and was     treated with Zantac.  She did not undergo an endoscopic evaluation.  2. She has a pinched nerve in her lumbar region.   FAMILY HISTORY:  Mother has a history of gastric polyps.  She has yearly  upper endoscopies.  Father died of lung cancer.  She had a paternal uncle  who had colon cancer in his 52s.  Her sister has IBS.   SOCIAL HISTORY:  She is single and has one daughter.  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  She smokes one pack of cigarettes daily.  She generally  drinks at least day during the week three-four beers.  She also has a six  pack over the weekend.   REVIEW OF SYSTEMS:  GASTROINTESTINAL:  Please see HPI.  GENERAL/CARDIAC:  Has had some wheezing lately.  Denies any chest pain.   PHYSICAL EXAMINATION:  VITAL SIGNS:  Weight 178, height 5 feet 7-1/2 inches,  temperature 96.1, blood pressure 110/70, pulse 72.  GENERAL:  A pleasant, well-nourished well-developed Caucasian female in no  acute distress.  SKIN:  Warm and dry.  No jaundice.  HEENT:  Conjunctivae pink.  Sclerae nonicteric.  Oropharyngeal mucosa moist  and pink.  No lesions, erythema, or exudate.  NECK:  No lymphadenopathy or thyromegaly.  LUNGS:  Reveal scattered expiratory wheeze.  CARDIAC:  Reveals regular rate and rhythm.  Normal S1 S2.  No murmurs, rubs,  or gallops.  ABDOMEN:  Positive bowel sounds.  Soft, nondistended.  She has mild  epigastric tenderness to deep palpation.  No organomegaly or masses.  EXTREMITIES:  No edema.   LABS:  From August 2004 normal LFTs.  TSH 1.759.  H. pylori positive.   IMPRESSION:  [name] is a pleasant 50 year old lady with multiple  gastrointestinal complaints including a couple month history of intermittent  melena associated with epigastric pain and known  helicobacter pylori.  (Status post recent treatment).  She reports a history of peptic ulcer  disease.  Based on these symptoms I would be concerned about peptic ulcer  disease.  She was also noted to have Hemoccult positive stool on rectal  examination a couple of months ago.  In addition she has had some episodes  of fresh blood per rectum.  I suspect this could be due to a lower  gastrointestinal source.  Possibly due to hemorrhoids.  However, cannot rule  out polyps, colitis, ulcerations, or even cancer.  She also has poorly  controlled gastroesophageal reflux disease and dysphasia.  She needs to have  further evaluation of this.   PLAN:  1. Colonoscopy and EGD with possible dilatation in the near future.  2. I have recommended no NSAIDS for now.  3. She will begin Aciphex 20 mg p.o. daily.  Samples given.  4. If she has any further melena or bright red blood per rectum in the     interim (prior to procedure) she is to give our office a call.   I would like to thank Dr. Sherwood Gambler for allowing me to participate in the care  of this patient.     ________________________________________  ___________________________________________  Tana Coast, P.A.                         [name], M.D.   LL/MEDQ  D:  08/12/2003  T:  08/12/2003  Job:  [id number]   cc:   Madelin Rear. Sherwood Gambler, M.D.  [address]  [place]  [place] [address]  Fax: [phone]

## 2011-02-25 NOTE — Op Note (Signed)
NAME:  [name], [name]                         ACCOUNT NO.:  [id number]   MEDICAL RECORD NO.:  [id number]                   PATIENT TYPE:  AMB   LOCATION:  DAY                                  FACILITY:  [facility]   PHYSICIAN:  Lionel December, M.D.                 DATE OF BIRTH:  1961/09/11   DATE OF PROCEDURE:  DATE OF DISCHARGE:                                 OPERATIVE REPORT   PROCEDURE:  Esophagogastroduodenoscopy with esophageal dilatation.   ENDOSCOPIST:  Lionel December, M.D.   INDICATIONS:  [name] is 50 year old Caucasian female who has multiple  symptoms.  She has intractable heartburn, occasional solid food dysphagia.  History of melena, rectal bleeding, heme positive stools, as well as  epigastric pain.  She has been treated recently for H. pylori gastritis but  has not felt any better.  She continues to take Goody's powders on a regular  basis; although she states she has not taken any in 3 days.  She is also  very concerned because family history is positive for various cancers  including colon cancer in her paternal uncle.  Father died of lung  carcinoma.  Mother has had gastric, but not colonic polyps according to the  patient.  She is undergoing diagnostic evaluation.  The procedure and risks  were reviewed with the patient and informed consent was obtained.   PREOPERATIVE MEDICATIONS:  Cetacaine spray for oropharyngeal topical  anesthesia, Demerol 75 mg IV and Versed 17 mg IV in divided dose.   FINDINGS:  Procedure performed in endoscopy suite.  The patient's vital  signs and O2 saturation were monitored during the procedure and remained  stable.   PROCEDURE #1: ESOPHAGOGASTRODUODENOSCOPY:  The patient was placed in the  left lateral recumbent position and Olympus videoscope was passed via the  oropharynx without any difficulty into the esophagus.   ESOPHAGUS:  Mucosa of the esophagus was normal.  She had 2 erosions at the  GE junction, one was triangular, about 5  mm in length.  She had a sliding  hiatal hernia measuring 3-4 cm in length.   STOMACH:  It was empty and distended very well with insufflation.  The folds  in the proximal stomach were normal.  Examination of the mucosa revealed  antral erosions but no ulcer crater was found. Pyloric channel was patent.  Angularis, fundus, and cardia were examined by retroflexing the scope and  were normal.   DUODENUM:  Examination of the bulb revealed multiple small erosions.  The  folds and mucosa in the second part of the duodenum were normal.   The endoscope was withdrawn.  The patient was prepared for procedure #2.   TOTAL COLONOSCOPY:  Rectal examination was performed.  No abnormality noted  on external or digital exam.   Olympus videoscope was placed in the rectum and advanced under vision into  the sigmoid colon and beyond.  Preparation was satisfactory  except she had  some pasty stool in the ascending colon and cecum requiring vigorous washing  to see the underlying mucosa.  Cecal landmarks were well seen. Appendiceal  orifice and the ileocecal valve were photographed for the rector.  The scope  was advanced to TI which was also normal.  As the scope was withdrawn the  colonic mucosa was carefully examined and there were no polyps or tumor  masses.  The rectal mucosa similarly was normal.   The scope was retroflexed to examine anorectal junction and small-to-  moderate sized hemorrhoids were noted below the dentate line. The endoscope  was straightened and withdrawn.  The patient tolerated the procedure well.   FINAL DIAGNOSES:  1. Erosive reflux esophagitis.  2. Erosive gastritis and bulbar duodenitis.  3. Normal colonoscopy and terminal ileoscopy except external hemorrhoids.  4. Suspect her gastritis and duodenitis is secondary to chronic use of ASA;     however, she still could have Helicobacter pylori infection not     eradicated with Helidac therapy.  5. Suspect rectal  bleeding/hematochezia secondary to hemorrhoids.   RECOMMENDATIONS:  1. Antireflux measures.  2. She will stay on Aciphex 20 mg p.o. q.a.m. She she runs out of samples     she will start Prilosec OTC 20 mg p.o. q.a.m.  3. She needs to refrain from using aspirin or other NSAIDS.  If she is     having recurrent headaches she needs to follow with Dr. Sherwood Gambler and explore     other therapies.  4. She will return for OV in 2 months to make sure her GI symptomatology is     well controlled.      ___________________________________________                                            Lionel December, M.D.   NR/MEDQ  D:  08/14/2003  T:  08/14/2003  Job:  [id number]   cc:   Madelin Rear. Sherwood Gambler, M.D.  [address]  [place]  [place] [address]  Fax: [phone]

## 2011-02-25 NOTE — Group Therapy Note (Signed)
[name], [name]               ACCOUNT NO.:  [id number]   MEDICAL RECORD NO.:  [id number]          PATIENT TYPE:  WOC   LOCATION:  [facility]                   FACILITY:  [facility]   PHYSICIAN:  Elsie Lincoln, MD      DATE OF BIRTH:  [birth year]   DATE OF SERVICE:  06/21/2006                                    CLINIC NOTE   Patient is a 50 year old white female who is returning wanting a Pap smear  today.  She is also requesting STD testing.  Is concerned that about a year  ago she had a PID episode and does still have the same partner.  Believes  they are mutually monogamous.  She is concerned that she has had a little  burning sensation in the vaginal area and also perianally since she bought  new dyed underwear.  She is bothered a lot by right leg pain which has been  severe enough to keep her awake at night.  She was seeing Dr. Sherlene Shams but is  no longer seeing him for this pain.  She states that the workup has been an  MRI of her back and that she has a pinched nerve.  She has stopped Xanax,  which she had been on for quite a long time.  She does have an appointment  on July 03, 2006 with an orthopedist concerning the leg pain.  She is  requesting something to help her with sleep until she is able to get more  definitive treatment for her leg pain.  She is asking about her estrogen  level because she feels tired.  She states that she has been menopausal for  two years.  She does not have hot flashes.  No problems with sexual  activity.  She is taking some kind of over-the-counter natural estrogen  product and is not interested in hormone therapy at this time.  Also, she is  stating that she had a mammogram done three weeks ago, which was negative.   PHYSICAL EXAMINATION:  VITAL SIGNS:  Temp 97.3, pulse 81, BP 120/81.  Weight  168.8, which is 10 pounds more than at her visit about a year ago.  GENERAL:  She is in no apparent distress, walking, but does seem rather  agitated.  HEART:  RRR.  LUNGS:  CTA.  BREASTS:  No discrete masses.  Did not check for adenopathy.  ABDOMEN:  Soft, nontender.  No organomegaly or masses noted.  PELVIC:  NAFG.  There are no apparently rash in external genitalia or  perianal region.  She has scant white discharge.  The vagina is slightly  pale with some rugae.  Cervix clean.  Pap smear done.  Uterus NSSP.  Adnexa  no masses.  No tenderness.   ASSESSMENT:  A 50 year old white female for routine gynecologic visit.  Leg  pain of musculoskeletal etiology.  Possible vaginitis or sexually  transmitted disease.   PLAN:  Gonorrhea, Chlamydia, Pap smear, wet prep, HIV, and RPR are done.  She is planning to get over-the-counter yeast medication since she does not  have insurance.  Will call her if any of  these tests are positive.  She can  follow up here in six months, meanwhile will see the orthopedist on  September 24th.     ______________________________  Caren Griffins, CNM    ______________________________  Elsie Lincoln, MD    DP/MEDQ  D:  06/21/2006  T:  06/22/2006  Job:  [id number]

## 2011-03-18 ENCOUNTER — Other Ambulatory Visit: Payer: Self-pay | Admitting: Family Medicine

## 2011-03-18 DIAGNOSIS — M545 Low back pain, unspecified: Secondary | ICD-10-CM

## 2011-03-18 DIAGNOSIS — M542 Cervicalgia: Secondary | ICD-10-CM

## 2011-03-18 NOTE — Telephone Encounter (Signed)
Last filled 02/18/11, #120 with 0 refills

## 2011-03-18 NOTE — Telephone Encounter (Signed)
Pt req script for Oxycodone HCI 10 mg, to be pick up on Monday 03/21/11. Also pt req samples of Spiriva for congestion.

## 2011-03-20 NOTE — Telephone Encounter (Signed)
OK to refill for one month and samples OK

## 2011-03-21 MED ORDER — OXYCODONE HCL 10 MG PO TABS: 1.0000 | ORAL_TABLET | Freq: Four times a day (QID) | ORAL | Status: DC | PRN

## 2011-03-21 MED ORDER — TIOTROPIUM BROMIDE MONOHYDRATE 18 MCG IN CAPS: 18.0000 ug | ORAL_CAPSULE | Freq: Every day | RESPIRATORY_TRACT | Status: DC

## 2011-03-21 NOTE — Telephone Encounter (Signed)
Rx printed, pt informed ready for pick-up, no spiriva samples available, pt informed and sent to her pharmacy

## 2011-04-18 ENCOUNTER — Other Ambulatory Visit: Payer: Self-pay | Admitting: *Deleted

## 2011-04-18 DIAGNOSIS — M542 Cervicalgia: Secondary | ICD-10-CM

## 2011-04-18 DIAGNOSIS — M545 Low back pain, unspecified: Secondary | ICD-10-CM

## 2011-04-18 NOTE — Telephone Encounter (Signed)
Refill request of oxycodone per pt due on 04/20/11.  Please call when ready for pick up.

## 2011-04-19 MED ORDER — HYDROCODONE-ACETAMINOPHEN 10-325 MG PO TABS: 1.0000 | ORAL_TABLET | Freq: Four times a day (QID) | ORAL | Status: DC | PRN

## 2011-04-19 MED ORDER — OXYCODONE HCL 10 MG PO TABS: 1.0000 | ORAL_TABLET | Freq: Four times a day (QID) | ORAL | Status: DC | PRN

## 2011-04-19 NOTE — Telephone Encounter (Signed)
Hydrocodone called into pharmacy by mistake, I spoke with pharmacist and she will cancel.  Will print Oxycodone and inform pt it will be ready for pick-up tomorrow

## 2011-04-19 NOTE — Telephone Encounter (Signed)
Refill OK

## 2011-04-20 ENCOUNTER — Telehealth: Payer: Self-pay | Admitting: *Deleted

## 2011-04-20 NOTE — Telephone Encounter (Signed)
Pt calling to check status of oxycodone refill.  (states she called Monday to request refill since she is due to have it filled today.  Please advise if rx will be available today?

## 2011-04-20 NOTE — Telephone Encounter (Signed)
Pt informed last night her med was ready for pick-up, reminded her again ready.

## 2011-04-21 ENCOUNTER — Other Ambulatory Visit: Payer: Self-pay | Admitting: *Deleted

## 2011-04-21 MED ORDER — ALPRAZOLAM 2 MG PO TABS: ORAL_TABLET | ORAL | Status: DC

## 2011-04-21 NOTE — Telephone Encounter (Signed)
Rx called in 

## 2011-04-21 NOTE — Telephone Encounter (Signed)
Rx refill request for Alprazolam, 2 and 1/2 tab daily, last filled 01/24/11,  number 75 with 2 refills Pt last OV was 09/2010

## 2011-04-21 NOTE — Telephone Encounter (Signed)
OK to refill for 3 months. 

## 2011-05-17 ENCOUNTER — Telehealth: Payer: Self-pay | Admitting: Family Medicine

## 2011-05-17 DIAGNOSIS — M545 Low back pain, unspecified: Secondary | ICD-10-CM

## 2011-05-17 DIAGNOSIS — M542 Cervicalgia: Secondary | ICD-10-CM

## 2011-05-17 NOTE — Telephone Encounter (Signed)
Last filled 04/18/10, #30 with 0 refills

## 2011-05-17 NOTE — Telephone Encounter (Signed)
Need clarification.  She had been on hydrocodone 10/325 mg and current request is for oxycodone

## 2011-05-17 NOTE — Telephone Encounter (Signed)
Pt requesting refill on Oxycodone HCl 10 MG TABS

## 2011-05-18 MED ORDER — OXYCODONE HCL 10 MG PO TABS: 1.0000 | ORAL_TABLET | Freq: Four times a day (QID) | ORAL | Status: DC | PRN

## 2011-05-18 NOTE — Telephone Encounter (Signed)
She was given an Rx for oxycodone 04/19/11

## 2011-05-18 NOTE — Telephone Encounter (Signed)
Has been on hydrocodone for years. We need to make sure oxycodone off list.  I'm not sure how she got on the oxycodone.

## 2011-05-18 NOTE — Telephone Encounter (Signed)
I spoke with pharmacist at pt pharmacy.  Pt has not had hydrocodone filled there since Feb.  Pt has been getting Oxycodone 10 mg #120 every month since.

## 2011-05-18 NOTE — Telephone Encounter (Signed)
We need to take oxycodone off med list .  She has been on hydrocodone and may refill for one month.

## 2011-05-18 NOTE — Telephone Encounter (Signed)
Pt informed Rx ready for pick up. 

## 2011-05-18 NOTE — Telephone Encounter (Signed)
D/C hydrocodone and refill Oxycodone.

## 2011-05-31 ENCOUNTER — Other Ambulatory Visit: Payer: Self-pay | Admitting: Family Medicine

## 2011-05-31 DIAGNOSIS — Z1231 Encounter for screening mammogram for malignant neoplasm of breast: Secondary | ICD-10-CM

## 2011-06-16 ENCOUNTER — Ambulatory Visit (INDEPENDENT_AMBULATORY_CARE_PROVIDER_SITE_OTHER): Payer: Self-pay | Admitting: Family Medicine

## 2011-06-16 ENCOUNTER — Encounter: Payer: Self-pay | Admitting: Family Medicine

## 2011-06-16 DIAGNOSIS — M545 Low back pain, unspecified: Secondary | ICD-10-CM

## 2011-06-16 DIAGNOSIS — R5383 Other fatigue: Secondary | ICD-10-CM

## 2011-06-16 DIAGNOSIS — M542 Cervicalgia: Secondary | ICD-10-CM

## 2011-06-16 DIAGNOSIS — F410 Panic disorder [episodic paroxysmal anxiety] without agoraphobia: Secondary | ICD-10-CM

## 2011-06-16 DIAGNOSIS — Z139 Encounter for screening, unspecified: Secondary | ICD-10-CM

## 2011-06-16 DIAGNOSIS — R5381 Other malaise: Secondary | ICD-10-CM

## 2011-06-16 DIAGNOSIS — F17200 Nicotine dependence, unspecified, uncomplicated: Secondary | ICD-10-CM

## 2011-06-16 DIAGNOSIS — J449 Chronic obstructive pulmonary disease, unspecified: Secondary | ICD-10-CM

## 2011-06-16 DIAGNOSIS — K219 Gastro-esophageal reflux disease without esophagitis: Secondary | ICD-10-CM

## 2011-06-16 LAB — BASIC METABOLIC PANEL
BUN: 14 mg/dL (ref 6–23)
CO2: 26 mEq/L (ref 19–32)
Calcium: 9.1 mg/dL (ref 8.4–10.5)
Creatinine, Ser: 0.6 mg/dL (ref 0.4–1.2)
Glucose, Bld: 92 mg/dL (ref 70–99)

## 2011-06-16 LAB — CBC WITH DIFFERENTIAL/PLATELET
Eosinophils Relative: 2 % (ref 0.0–5.0)
HCT: 39.4 % (ref 36.0–46.0)
Lymphs Abs: 2.4 10*3/uL (ref 0.7–4.0)
MCV: 98 fl (ref 78.0–100.0)
Monocytes Absolute: 0.6 10*3/uL (ref 0.1–1.0)
Platelets: 226 10*3/uL (ref 150.0–400.0)
WBC: 9.7 10*3/uL (ref 4.5–10.5)

## 2011-06-16 LAB — LDL CHOLESTEROL, DIRECT: Direct LDL: 102.7 mg/dL

## 2011-06-16 LAB — TSH: TSH: 1.81 u[IU]/mL (ref 0.35–5.50)

## 2011-06-16 LAB — LIPID PANEL: HDL: 54.4 mg/dL (ref >39.00)

## 2011-06-16 LAB — HEPATIC FUNCTION PANEL
AST: 18 U/L (ref 0–37)
Alkaline Phosphatase: 67 U/L (ref 39–117)
Bilirubin, Direct: 0 mg/dL (ref 0.0–0.3)
Total Bilirubin: 0.2 mg/dL — ABNORMAL LOW (ref 0.3–1.2)

## 2011-06-16 MED ORDER — OXYCODONE HCL 10 MG PO TABS: 1.0000 | ORAL_TABLET | Freq: Four times a day (QID) | ORAL | Status: DC | PRN

## 2011-06-16 NOTE — Progress Notes (Signed)
Subjective:    Patient ID: [name], female    DOB: [birth year], 50 y.o.   MRN: [id number]  HPI Followup multiple medical problems. Patient has history of chronic low back pain, chronic anxiety, GERD, degenerative arthritis, and ongoing nicotine use. Recent increase in GERD symptoms. Uses over-the-counter Prilosec. Previously has used AcipHex with good success. Patient complains of increased fatigue. Not exercising. Poor sleep. Weight gain in recent years. No recent thyroid functions.  Ongoing nicotine use. Smokes about one and one half pack cigarette per day. History of COPD.  Some chronic early morning cough which is unchanged. No hemoptysis.  Dyspnea with activity which is unchanged. No chest pain. Compliant with inhalers.  Chronic pain treated with oxycodone 10 mg 4 times daily. Occasional constipation. Recently complaining of right radiculopathy pain right low back to leg. No weakness. No incontinence. Occasional mild pain. Previous intolerance to prednisone and Neurontin. No relief with nonsteroidals  Past Medical History  Diagnosis Date  . PANIC DISORDER 04/18/2007  . TOBACCO ABUSE 07/17/2009  . CARPAL TUNNEL SYNDROME, BILATERAL 01/16/2008  . ASTHMA UNSPECIFIED WITH EXACERBATION 07/21/2010  . COPD 05/08/2008  . GERD 04/18/2007  . HELICOBACTER PYLORI GASTRITIS, HX OF 04/18/2007   Past Surgical History  Procedure Date  . Appendectomy   . Oophorectomy     reports that she has been smoking Cigarettes.  She has a 50 pack-year smoking history. She does not have any smokeless tobacco history on file. Her alcohol and drug histories not on file. family history includes Cancer in her father and Diabetes in her mother. Allergies  Allergen Reactions  . Aspirin     REACTION: had h. pylori due to too many goody powders. told not to take any more asa      Review of Systems  Constitutional: Positive for fatigue. Negative for fever, chills, appetite change and unexpected weight change.  HENT:  Negative for trouble swallowing.   Respiratory: Positive for cough and shortness of breath. Negative for wheezing.   Cardiovascular: Negative for chest pain, palpitations and leg swelling.  Gastrointestinal: Positive for constipation. Negative for nausea, vomiting and abdominal pain.  Genitourinary: Negative for dysuria and difficulty urinating.  Musculoskeletal: Positive for back pain.  Psychiatric/Behavioral: Positive for sleep disturbance and dysphoric mood. Negative for suicidal ideas and agitation. The patient is nervous/anxious.        Objective:   Physical Exam  Constitutional: She is oriented to person, place, and time. She appears well-developed and well-nourished.  HENT:  Right Ear: External ear normal.  Left Ear: External ear normal.  Mouth/Throat: Oropharynx is clear and moist. No oropharyngeal exudate.  Eyes: Pupils are equal, round, and reactive to light.  Neck: Neck supple. No thyromegaly present.  Cardiovascular: Normal rate, regular rhythm and normal heart sounds.   Pulmonary/Chest: Effort normal and breath sounds normal. No respiratory distress. She has no wheezes. She has no rales.  Abdominal: Soft. Bowel sounds are normal. She exhibits no distension. There is no tenderness. There is no rebound and no guarding.  Musculoskeletal: She exhibits no edema.  Lymphadenopathy:    She has no cervical adenopathy.  Neurological: She is alert and oriented to person, place, and time.  Psychiatric: She has a normal mood and affect. Her behavior is normal.          Assessment & Plan:  #1 COPD. Ongoing nicotine use. Considerable time spent discussing smoking cessation. Motivation fairly low. Provided samples Spiriva. #2 GERD. Continue Prilosec. Discussed smoking cessation and dietary factors #3 chronic  low back pain. Nonfocal neurologic exam. Refill oxycodone #4 fatigue. Likely multifactorial. Obtain some screening lab work. Discussed importance of smoking cessation and more  regular exercise and weight loss

## 2011-06-17 ENCOUNTER — Other Ambulatory Visit: Payer: Self-pay | Admitting: Family Medicine

## 2011-06-17 ENCOUNTER — Telehealth: Payer: Self-pay | Admitting: Family Medicine

## 2011-06-17 DIAGNOSIS — E785 Hyperlipidemia, unspecified: Secondary | ICD-10-CM

## 2011-06-17 MED ORDER — ATORVASTATIN CALCIUM 20 MG PO TABS: 20.0000 mg | ORAL_TABLET | Freq: Every day | ORAL | Status: DC

## 2011-06-17 MED ORDER — ROSUVASTATIN CALCIUM 20 MG PO TABS: 20.0000 mg | ORAL_TABLET | Freq: Every day | ORAL | Status: DC

## 2011-06-17 NOTE — Telephone Encounter (Signed)
I had recommended in my note Lipitor 20 mg daily.  crestor was called in.  Let's change to Lipitor.

## 2011-06-17 NOTE — Telephone Encounter (Signed)
Change made and sent tp pharmacy Attempt to call- ans mach LMTCB if question , new rx sent

## 2011-06-17 NOTE — Progress Notes (Signed)
Quick Note:  Attempt to call - VM - LMTCB if questions - gave dr. Lucie Leather instructions, ordered new med and future lab ______

## 2011-06-17 NOTE — Telephone Encounter (Signed)
Was put Crestor. Patient stated that it was too expensive. Would like  something cheaper or the generic. Patient has no ins. Her pharmacy is [facility].

## 2011-06-20 ENCOUNTER — Telehealth: Payer: Self-pay | Admitting: *Deleted

## 2011-06-20 ENCOUNTER — Ambulatory Visit (HOSPITAL_COMMUNITY)
Admission: RE | Admit: 2011-06-20 | Discharge: 2011-06-20 | Disposition: A | Discharge status: Home / Self Care | Payer: Self-pay | Source: Ambulatory Visit | Attending: Family Medicine | Admitting: Family Medicine

## 2011-06-20 DIAGNOSIS — Z1231 Encounter for screening mammogram for malignant neoplasm of breast: Secondary | ICD-10-CM

## 2011-06-20 NOTE — Telephone Encounter (Signed)
Faxed not from pt pharmacy, [facility] [place].  "Pt cannot afford Crestor, does not have insurance.  Can med be changed to something else?"

## 2011-06-20 NOTE — Telephone Encounter (Signed)
This was handled (I think) already by Selena Batten.  I had ordered Lipitor and not Crestor.

## 2011-07-04 ENCOUNTER — Telehealth: Payer: Self-pay | Admitting: Family Medicine

## 2011-07-04 NOTE — Telephone Encounter (Signed)
Pt called and said that she can not take Lipitor. Pt stopped taking Lipitor on Friday and said that she that it is giving pt leg cramps, groggy feeling, no energy. Pt has been taking Omega 3 mg, as suggested by PCP. Pt also has been taking some Red Yeast Rice in order to lower cholesterol. Pt is excercising and has cut back on smoking. Pt is wondering if there an alternative to Lipitor, that is not expensive.

## 2011-07-05 ENCOUNTER — Other Ambulatory Visit: Payer: Self-pay | Admitting: *Deleted

## 2011-07-05 ENCOUNTER — Other Ambulatory Visit: Payer: Self-pay | Admitting: Family Medicine

## 2011-07-05 DIAGNOSIS — E785 Hyperlipidemia, unspecified: Secondary | ICD-10-CM

## 2011-07-05 MED ORDER — LOVASTATIN 20 MG PO TABS: 20.0000 mg | ORAL_TABLET | Freq: Every day | ORAL | Status: DC

## 2011-07-05 NOTE — Telephone Encounter (Signed)
Alprazolam 2 mg, take 2 and 1/2 tab daily, #75 with 2 refills done last on 04/21/11

## 2011-07-05 NOTE — Telephone Encounter (Signed)
Because of her severe hyperlipidemia, she really needs potent statin such as Lipitor or Crestor but Crestor not generic.  Would be better to have her on weaker statin than none so start Lovastatin 20 mg daily and repeat lipids and hepatic in 6-8 weeks and continue with omega 3.

## 2011-07-05 NOTE — Telephone Encounter (Signed)
Shouldn't need until Oct 12.

## 2011-07-05 NOTE — Telephone Encounter (Signed)
Please advise 

## 2011-07-06 NOTE — Telephone Encounter (Signed)
Faxed form back to pharmacy denied until 10/12.

## 2011-07-13 ENCOUNTER — Other Ambulatory Visit: Payer: Self-pay | Admitting: Family Medicine

## 2011-07-13 DIAGNOSIS — M545 Low back pain, unspecified: Secondary | ICD-10-CM

## 2011-07-13 DIAGNOSIS — M542 Cervicalgia: Secondary | ICD-10-CM

## 2011-07-13 NOTE — Telephone Encounter (Signed)
Last filled #120, on 06-16-11

## 2011-07-13 NOTE — Telephone Encounter (Signed)
Pt called req script for Oxycodone HCl 10 MG TABS. Pt req to pick up script on Friday 07/15/11

## 2011-07-15 MED ORDER — OXYCODONE HCL 10 MG PO TABS: 1.0000 | ORAL_TABLET | Freq: Four times a day (QID) | ORAL | Status: DC | PRN

## 2011-07-15 MED ORDER — ALPRAZOLAM 2 MG PO TABS: ORAL_TABLET | ORAL | Status: DC

## 2011-07-15 NOTE — Telephone Encounter (Signed)
OK to refill Xanax for 3 months.  Oxycodone written for one month.

## 2011-07-15 NOTE — Telephone Encounter (Signed)
Please advise 

## 2011-07-15 NOTE — Telephone Encounter (Signed)
Pt informed Rx ready for pick up. 

## 2011-07-15 NOTE — Telephone Encounter (Signed)
Pt called back again regarding Oxycodone and in addition she would like a refill on Zanax. Please contact pt

## 2011-08-09 ENCOUNTER — Telehealth: Payer: Self-pay | Admitting: Family Medicine

## 2011-08-09 DIAGNOSIS — M545 Low back pain, unspecified: Secondary | ICD-10-CM

## 2011-08-09 DIAGNOSIS — M542 Cervicalgia: Secondary | ICD-10-CM

## 2011-08-09 NOTE — Telephone Encounter (Signed)
Pt called req refill of Oxycodone HCl 10 MG TABS to be picked up on Friday 11/2  Pt also req to get samples of Aciphex and Spiriva.

## 2011-08-09 NOTE — Telephone Encounter (Signed)
Last filled 07/15/11, 11/05 is next Monday, pt requesting pick-up on 11/2, this coming Friday

## 2011-08-10 NOTE — Telephone Encounter (Signed)
Pt informed on VM Rx will NOT be ready for pick-up on Friday, will be filled on Monday Nov 5

## 2011-08-10 NOTE — Telephone Encounter (Signed)
Refill Monday.  This cannot be filled early.

## 2011-08-11 NOTE — Telephone Encounter (Signed)
Pt called back and said that her script will be due to be filled on 11/3, which will be 30 days, since there are 31 days in month of October. Pt says that shes not trying to get an early refill. Pt still wanting to pick up script on Friday 11/2.

## 2011-08-11 NOTE — Telephone Encounter (Signed)
OK to refill for Friday.

## 2011-08-12 MED ORDER — OXYCODONE HCL 10 MG PO TABS: 1.0000 | ORAL_TABLET | Freq: Four times a day (QID) | ORAL | Status: DC | PRN

## 2011-08-12 NOTE — Telephone Encounter (Signed)
Pt informed Rx ready for pick up. 

## 2011-08-26 ENCOUNTER — Ambulatory Visit (INDEPENDENT_AMBULATORY_CARE_PROVIDER_SITE_OTHER): Payer: Self-pay | Admitting: Internal Medicine

## 2011-08-26 ENCOUNTER — Encounter: Payer: Self-pay | Admitting: Internal Medicine

## 2011-08-26 VITALS — BP 120/80 | HR 83 | Temp 98.6°F | Wt 160.0 lb

## 2011-08-26 DIAGNOSIS — R059 Cough, unspecified: Secondary | ICD-10-CM

## 2011-08-26 DIAGNOSIS — J45901 Unspecified asthma with (acute) exacerbation: Secondary | ICD-10-CM

## 2011-08-26 DIAGNOSIS — F17200 Nicotine dependence, unspecified, uncomplicated: Secondary | ICD-10-CM

## 2011-08-26 DIAGNOSIS — R05 Cough: Secondary | ICD-10-CM

## 2011-08-26 DIAGNOSIS — J441 Chronic obstructive pulmonary disease with (acute) exacerbation: Secondary | ICD-10-CM | POA: Insufficient documentation

## 2011-08-26 MED ORDER — DOXYCYCLINE HYCLATE 100 MG PO CAPS: 100.0000 mg | ORAL_CAPSULE | Freq: Two times a day (BID) | ORAL | Status: AC

## 2011-08-26 MED ORDER — PREDNISONE 20 MG PO TABS: ORAL_TABLET | ORAL | Status: AC

## 2011-08-26 MED ORDER — HYDROCODONE-HOMATROPINE 5-1.5 MG/5ML PO SYRP: 5.0000 mL | ORAL_SOLUTION | ORAL | Status: DC | PRN

## 2011-08-26 MED ORDER — ALBUTEROL SULFATE (2.5 MG/3ML) 0.083% IN NEBU
2.5000 mg | INHALATION_SOLUTION | Freq: Once | RESPIRATORY_TRACT | Status: AC
  Administered 2011-08-26: 2.5 mg via RESPIRATORY_TRACT

## 2011-08-26 NOTE — Progress Notes (Signed)
  Subjective:    Patient ID: [name], female    DOB: [birth year], 50 y.o.   MRN: [id number]  HPI Patient comes in today for SDA  For acute problem evaluation. For 1 week  Has had rti sx and cough and now got worse   For last 2 days very bad coughing  And worse when laying down and on robitussion.   Low grade temp no fever chills otherwise . Has some wheezing and sob  Knows she has to stop tobacco never tried hard  dependent family hxof copd and lung cancer  Has spiriva.   Ran out of q var about a month ago.   Cost   CNA  Private duty  cna for 50 year old.    Review of Systems Has  Chronic pain and on narcotics as needed  And benzos for panic    No gi gu fever weight loss hemoptysis  Past history family history social history reviewed in the electronic medical record.      Objective:   Physical Exam WDWN in NAD  quiet respirations; mildly congested  somewhat hoarse. Non toxic . HEENT: Normocephalic ;atraumatic , Eyes;  PERRL, EOMs  Full, lids and conjunctiva clear,,Ears: no deformities, canals nl, TM landmarks normal, Nose: no deformity or discharge but congested;face non tender Mouth : OP clear without lesion or edema . Neck: Supple without adenopathy or masses or bruits Chest:  itght wheezes  No rales   Improve bs after nebulizer . CV:  S1-S2 no gallops or murmurs peripheral perfusion is normal Skin :nl perfusion and no acute rashes  Nebulizer  Some  Improvement breath sounds      Assessment & Plan:  Wheezy bronchitis  Presumed ae copd  Infection induced Limited resources  No insurance  Empiric rx  Steroid and  Antibiotic   Samples of spiriva dulera and q var given Counseled. At length about tobacco and health  Risk esp with her family hx .  chronic pain on narcotics  Caution with cough meds.  Pt aware

## 2011-08-26 NOTE — Patient Instructions (Signed)
Antibiotic and steroid  For this infection Stop tobacco as we discussed .

## 2011-09-08 ENCOUNTER — Telehealth: Payer: Self-pay | Admitting: *Deleted

## 2011-09-08 ENCOUNTER — Other Ambulatory Visit: Payer: Self-pay | Admitting: Family Medicine

## 2011-09-08 DIAGNOSIS — M545 Low back pain, unspecified: Secondary | ICD-10-CM

## 2011-09-08 DIAGNOSIS — M542 Cervicalgia: Secondary | ICD-10-CM

## 2011-09-08 MED ORDER — OXYCODONE HCL 10 MG PO TABS: 10.0000 mg | ORAL_TABLET | Freq: Four times a day (QID) | ORAL | Status: DC | PRN

## 2011-09-08 NOTE — Telephone Encounter (Signed)
Add Aciphex to med list per Dr Caryl Never

## 2011-09-08 NOTE — Telephone Encounter (Signed)
Pt need new rx oxycodone 10mg  also samples of aciphex and spriva

## 2011-09-08 NOTE — Telephone Encounter (Signed)
Pt informed Rx ready for pick-up, coupon for Spiriva, 4 bottles of Aciphex given.  This med is not on pt med list??

## 2011-09-08 NOTE — Telephone Encounter (Signed)
OK to refill and samples OK.

## 2011-10-05 ENCOUNTER — Other Ambulatory Visit: Payer: Self-pay | Admitting: Family Medicine

## 2011-10-05 DIAGNOSIS — M545 Low back pain, unspecified: Secondary | ICD-10-CM

## 2011-10-05 DIAGNOSIS — M542 Cervicalgia: Secondary | ICD-10-CM

## 2011-10-05 NOTE — Telephone Encounter (Signed)
Pt req refill of Oxycodone HCl 10 MG TABS. Pt says her refill is due on Sunday 10/09/11. Pt is aware that pcp is out of office this wk. Req to pick up script on Friday.

## 2011-10-06 NOTE — Telephone Encounter (Signed)
ok 

## 2011-10-06 NOTE — Telephone Encounter (Signed)
Dr Caryl Never out of office until Monday, Dec 31. Oxycydone last filled/printed 09-08-11, sig one tab every 4 hours prn.  #120 with 0 refills Please advise

## 2011-10-07 MED ORDER — OXYCODONE HCL 10 MG PO TABS: 10.0000 mg | ORAL_TABLET | Freq: Four times a day (QID) | ORAL | Status: DC | PRN

## 2011-10-07 NOTE — Telephone Encounter (Signed)
Pt informed Rx ready to pick up

## 2011-10-13 ENCOUNTER — Other Ambulatory Visit: Payer: Self-pay | Admitting: *Deleted

## 2011-10-13 MED ORDER — ALPRAZOLAM 2 MG PO TABS: ORAL_TABLET | ORAL | Status: DC

## 2011-10-13 NOTE — Telephone Encounter (Signed)
Faxed alprazolam refill request, last filled 07-15-11, #75 with 2 refills.  Pt takes 2 and 1/2 tabs daily.

## 2011-10-13 NOTE — Telephone Encounter (Signed)
Rx called in to pharmacy. 

## 2011-10-13 NOTE — Telephone Encounter (Signed)
Refill for 3 months. 

## 2011-11-01 ENCOUNTER — Telehealth: Payer: Self-pay | Admitting: Family Medicine

## 2011-11-01 DIAGNOSIS — M545 Low back pain, unspecified: Secondary | ICD-10-CM

## 2011-11-01 DIAGNOSIS — M542 Cervicalgia: Secondary | ICD-10-CM

## 2011-11-01 NOTE — Telephone Encounter (Signed)
Refill Oxycodone. Thanks. °

## 2011-11-02 NOTE — Telephone Encounter (Signed)
Last filled 12/28, #120 with o refills

## 2011-11-04 MED ORDER — OXYCODONE HCL 10 MG PO TABS: 10.0000 mg | ORAL_TABLET | Freq: Four times a day (QID) | ORAL | Status: DC | PRN

## 2011-11-04 NOTE — Telephone Encounter (Signed)
Pt informed ready to pick up

## 2011-11-04 NOTE — Telephone Encounter (Signed)
Refill OK

## 2011-11-14 ENCOUNTER — Encounter: Payer: Self-pay | Admitting: Family Medicine

## 2011-11-14 ENCOUNTER — Ambulatory Visit (INDEPENDENT_AMBULATORY_CARE_PROVIDER_SITE_OTHER): Payer: Self-pay | Admitting: Family Medicine

## 2011-11-14 DIAGNOSIS — R739 Hyperglycemia, unspecified: Secondary | ICD-10-CM

## 2011-11-14 DIAGNOSIS — F17200 Nicotine dependence, unspecified, uncomplicated: Secondary | ICD-10-CM

## 2011-11-14 DIAGNOSIS — J449 Chronic obstructive pulmonary disease, unspecified: Secondary | ICD-10-CM

## 2011-11-14 DIAGNOSIS — E785 Hyperlipidemia, unspecified: Secondary | ICD-10-CM | POA: Insufficient documentation

## 2011-11-14 DIAGNOSIS — R7309 Other abnormal glucose: Secondary | ICD-10-CM

## 2011-11-14 LAB — GLUCOSE, POCT (MANUAL RESULT ENTRY): POC Glucose: 121

## 2011-11-14 NOTE — Progress Notes (Signed)
  Subjective:    Patient ID: [name], female    DOB: 01-Nov-1960, 51 y.o.   MRN: [id number]  HPI  Patient has long history of ongoing nicotine use and COPD. She is seen today for followup of hyperlipidemia and also concerns for elevated blood sugar. She has never been formally diagnosed with diabetes. Her mother recently died from complications of diabetes. Patient checked blood sugar fasting yesterday 195 and today 143. Some recent mild blurred vision. Occasional thirst and urine frequency. Poor diet. Drinks sweetened tea  History of severe dyslipidemia. Triglycerides over 2000. Cholesterol over 400. No history of pancreatitis. Poor diet. Initially tried Crestor and Lipitor but had side effects. Currently taking Mevacor 20 mg daily but is starting to have some muscle aches in both legs. Has also added fish oil. No history of CAD or peripheral vascular disease  Ongoing nicotine use. Low motivation to quit in past. Takes Spiriva and Qvar inconsistently because of cost  Review of Systems  Constitutional: Positive for fatigue. Negative for appetite change and unexpected weight change.  Respiratory: Positive for shortness of breath. Negative for cough and wheezing.   Cardiovascular: Negative for chest pain, palpitations and leg swelling.  Gastrointestinal: Negative for abdominal pain.  Neurological: Negative for dizziness and headaches.       Objective:   Physical Exam  Constitutional: She appears well-developed and well-nourished.  HENT:  Mouth/Throat: Oropharynx is clear and moist.  Neck: Neck supple. No thyromegaly present.  Cardiovascular: Normal rate and regular rhythm.   Pulmonary/Chest: Effort normal and breath sounds normal. No respiratory distress. She has no wheezes. She has no rales.  Abdominal: Soft. Bowel sounds are normal. There is no tenderness.       May have small periumbilical hernia. Nontender  Musculoskeletal: She exhibits no edema.  Lymphadenopathy:    She has no  cervical adenopathy.  Psychiatric: She has a normal mood and affect. Her behavior is normal.          Assessment & Plan:  #1 hyperglycemia. Fasting blood sugar today 121. Dietary factors discussed. Secretary/administrator given. Glucose monitor given. Monitor fasting blood sugar. Make dietary modifications and lose weight.  Check A1C. #2 severe dyslipidemia. Recheck lipid and hepatic panel. May not be able to titrate Mevacor further because of myalgias.  #3 COPD with ongoing nicotine use. Discussed smoking cessation. Current motivation low.

## 2011-11-14 NOTE — Patient Instructions (Signed)
You need to quit smoking Reduce sugars and starches and work on weight loss.

## 2011-11-15 ENCOUNTER — Telehealth: Payer: Self-pay | Admitting: Family Medicine

## 2011-11-15 LAB — HEPATIC FUNCTION PANEL
ALT: 22 U/L (ref 0–35)
AST: 16 U/L (ref 0–37)
Bilirubin, Direct: 0 mg/dL (ref 0.0–0.3)
Total Protein: 8.3 g/dL (ref 6.0–8.3)

## 2011-11-15 LAB — LIPID PANEL
HDL: 45.1 mg/dL (ref >39.00)
Triglycerides: 790 mg/dL — ABNORMAL HIGH (ref 0.0–149.0)

## 2011-11-15 NOTE — Telephone Encounter (Signed)
I have not seen her labs yet (not back).  Her lipids were VERY high as we discussed and that is why we have been trying MULTIPLE lipid meds-though she has not tolerated most.   Most important thing she can do is to QUIT smoking to reduce her cardiac risk.

## 2011-11-15 NOTE — Telephone Encounter (Signed)
Pt called and is really concerned about her lab results from Sept 2012. Pt is also req lab results from yesterday. Pt req a call back from Dr. Caryl Never only.

## 2011-11-16 NOTE — Telephone Encounter (Signed)
Pt calling again, labs available now

## 2011-11-16 NOTE — Telephone Encounter (Signed)
I have already reviewed lab results.  See under lab section for comments.

## 2011-11-17 NOTE — Progress Notes (Signed)
Quick Note:  Pt informed, mailed copy of labs to home ______

## 2011-11-29 ENCOUNTER — Telehealth: Payer: Self-pay | Admitting: Family Medicine

## 2011-11-29 DIAGNOSIS — M542 Cervicalgia: Secondary | ICD-10-CM

## 2011-11-29 DIAGNOSIS — M545 Low back pain, unspecified: Secondary | ICD-10-CM

## 2011-11-29 NOTE — Telephone Encounter (Signed)
Patient calling for her refill on oxycodone that is due on Saturday. Please assist and inform patient when available.

## 2011-12-01 NOTE — Telephone Encounter (Signed)
Ok to refill tomorrow.

## 2011-12-01 NOTE — Telephone Encounter (Signed)
Last filled 09/12/12

## 2011-12-02 MED ORDER — OXYCODONE HCL 10 MG PO TABS: 10.0000 mg | ORAL_TABLET | Freq: Four times a day (QID) | ORAL | Status: DC | PRN

## 2011-12-02 NOTE — Telephone Encounter (Signed)
Pt informed ready to pick up

## 2011-12-27 ENCOUNTER — Other Ambulatory Visit: Payer: Self-pay | Admitting: Family Medicine

## 2011-12-27 DIAGNOSIS — M545 Low back pain, unspecified: Secondary | ICD-10-CM

## 2011-12-27 DIAGNOSIS — M542 Cervicalgia: Secondary | ICD-10-CM

## 2011-12-27 NOTE — Telephone Encounter (Signed)
Refill by 12-29-11.

## 2011-12-27 NOTE — Telephone Encounter (Signed)
Pt would like samples of aciphex also new rx oxycodone 10mg 

## 2011-12-27 NOTE — Telephone Encounter (Signed)
Oxycodone last filled 12/01/10, #120 with 0 refills

## 2011-12-29 MED ORDER — OXYCODONE HCL 10 MG PO TABS: 10.0000 mg | ORAL_TABLET | Freq: Four times a day (QID) | ORAL | Status: DC | PRN

## 2011-12-29 NOTE — Telephone Encounter (Signed)
Pt informed Rx ready for pick up. 

## 2012-01-05 ENCOUNTER — Other Ambulatory Visit: Payer: Self-pay | Admitting: *Deleted

## 2012-01-05 NOTE — Telephone Encounter (Signed)
Too soon

## 2012-01-05 NOTE — Telephone Encounter (Signed)
Alprazolam 2 mg, 2 and 1/2 tabs daily as directed Last filled #75, RF 2 on 10/13/2011

## 2012-01-05 NOTE — Telephone Encounter (Signed)
Faxed refill back to pharmacy with Too Soon message

## 2012-01-09 ENCOUNTER — Telehealth: Payer: Self-pay | Admitting: Family Medicine

## 2012-01-09 NOTE — Telephone Encounter (Signed)
Explained to pt last week request was too soon, will be due on 4/3, pt voiced her understanding.

## 2012-01-09 NOTE — Telephone Encounter (Signed)
Pt requesting refill on    alprazolam Prudy Feeler) 2 MG tablet    Last filled 10/13/11  [facility]

## 2012-01-11 ENCOUNTER — Telehealth: Payer: Self-pay | Admitting: Family Medicine

## 2012-01-11 MED ORDER — ALPRAZOLAM 2 MG PO TABS: ORAL_TABLET | ORAL | Status: DC

## 2012-01-11 NOTE — Telephone Encounter (Signed)
Rx called in, pt informed on VM 

## 2012-01-11 NOTE — Telephone Encounter (Signed)
Refill OK

## 2012-01-11 NOTE — Telephone Encounter (Signed)
Pt called and said that she wanted to be sure that her alprazolam Prudy Feeler) 2 MG tablet to [phone] today 01/11/12, because med is due to be filled today. Pls call in asap today.

## 2012-01-25 ENCOUNTER — Telehealth: Payer: Self-pay | Admitting: Family Medicine

## 2012-01-25 DIAGNOSIS — M542 Cervicalgia: Secondary | ICD-10-CM

## 2012-01-25 DIAGNOSIS — M545 Low back pain, unspecified: Secondary | ICD-10-CM

## 2012-01-25 NOTE — Telephone Encounter (Signed)
Last filled 12-29-11, #120 with 0 refills

## 2012-01-25 NOTE — Telephone Encounter (Signed)
Pt called and is req refill of Oxycodone HCl 10 MG TABS for pick up on Friday 01/27/12.

## 2012-01-27 MED ORDER — OXYCODONE HCL 10 MG PO TABS: 10.0000 mg | ORAL_TABLET | Freq: Four times a day (QID) | ORAL | Status: DC | PRN

## 2012-01-27 NOTE — Telephone Encounter (Signed)
Refill OK

## 2012-01-27 NOTE — Telephone Encounter (Signed)
Pt called to check status of refill. Please contact when ready to pick up 

## 2012-01-27 NOTE — Telephone Encounter (Signed)
Pt informed Rx ready 

## 2012-01-30 ENCOUNTER — Encounter: Payer: Self-pay | Admitting: Advanced Practice Midwife

## 2012-01-30 ENCOUNTER — Ambulatory Visit (INDEPENDENT_AMBULATORY_CARE_PROVIDER_SITE_OTHER): Payer: Self-pay | Admitting: Advanced Practice Midwife

## 2012-01-30 VITALS — BP 112/76 | HR 90 | Temp 97.4°F | Ht 67.0 in | Wt 159.3 lb

## 2012-01-30 DIAGNOSIS — F329 Major depressive disorder, single episode, unspecified: Secondary | ICD-10-CM

## 2012-01-30 DIAGNOSIS — Z780 Asymptomatic menopausal state: Secondary | ICD-10-CM

## 2012-01-30 DIAGNOSIS — F32A Depression, unspecified: Secondary | ICD-10-CM

## 2012-01-30 DIAGNOSIS — Z01419 Encounter for gynecological examination (general) (routine) without abnormal findings: Secondary | ICD-10-CM

## 2012-01-30 MED ORDER — CITALOPRAM HYDROBROMIDE 20 MG PO TABS: 20.0000 mg | ORAL_TABLET | Freq: Every day | ORAL | Status: DC

## 2012-01-30 NOTE — Patient Instructions (Signed)
Menopause Menopause is the normal time of life when menstrual periods stop completely. Menopause is complete when you have missed 12 consecutive menstrual periods. It usually occurs between the ages of 48 to 55, with an average age of 51. Very rarely does a woman develop menopause before 53 years old. At menopause, your ovaries stop producing the female hormones, estrogen and progesterone. This can cause undesirable symptoms and also affect your health. Sometimes the symptoms may occur 4 to 5 years before the menopause begins. There is no relationship between menopause and:  Oral contraceptives.   Number of children you had.   Race.   The age your menstrual periods started (menarche).  Heavy smokers and very thin women may develop menopause earlier in life. CAUSES  The ovaries stop producing the female hormones estrogen and progesterone.   Other causes include:   Surgery to remove both ovaries.   The ovaries stop functioning for no known reason.   Tumors of the pituitary gland in the brain.   Medical disease that affects the ovaries and hormone production.   Radiation treatment to the abdomen or pelvis.   Chemotherapy that affects the ovaries.  SYMPTOMS   Hot flashes.   Night sweats.   Decrease in sex drive.   Vaginal dryness and thinning of the vagina causing painful intercourse.   Dryness of the skin and developing wrinkles.   Headaches.   Tiredness.   Irritability.   Memory problems.   Weight gain.   Bladder infections.   Hair growth of the face and chest.   Infertility.  More serious symptoms include:  Loss of bone (osteoporosis) causing breaks (fractures).   Depression.   Hardening and narrowing of the arteries (atherosclerosis) causing heart attacks and strokes.  DIAGNOSIS   When the menstrual periods have stopped for 12 straight months.   Physical exam.   Hormone studies of the blood.  TREATMENT  There are many treatment choices and nearly  as many questions about them. The decisions to treat or not to treat menopausal changes is an individual choice made with your caregiver. Your caregiver can discuss the treatments with you. Together, you can decide which treatment will work best for you. Your treatment choices may include:   Hormone therapy (estorgen and progesterone).   Non-hormonal medications.   Treating the individual symptoms with medication (for example antidepressants for depression).   Herbal medications that may help specific symptoms.   Counseling by a psychiatrist or psychologist.   Group therapy.   Lifestyle changes including:   Eating healthy.   Regular exercise.   Limiting caffeine and alcohol.   Stress management and meditation.   No treatment.  HOME CARE INSTRUCTIONS   Take the medication your caregiver gives you as directed.   Get plenty of sleep and rest.   Exercise regularly.   Eat a diet that contains calcium (good for the bones) and soy products (acts like estrogen hormone).   Avoid alcoholic beverages.   Do not smoke.   If you have hot flashes, dress in layers.   Take supplements, calcium and vitamin D to strengthen bones.   You can use over-the-counter lubricants or moisturizers for vaginal dryness.   Group therapy is sometimes very helpful.   Acupuncture may be helpful in some cases.  SEEK MEDICAL CARE IF:   You are not sure you are in menopause.   You are having menopausal symptoms and need advice and treatment.   You are still having menstrual periods after age 55.     You have pain with intercourse.   Menopause is complete (no menstrual period for 12 months) and you develop vaginal bleeding.   You need a referral to a specialist (gynecologist, psychiatrist or psychologist) for treatment.  SEEK IMMEDIATE MEDICAL CARE IF:   You have severe depression.   You have excessive vaginal bleeding.   You fell and think you have a broken bone.   You have pain when you  urinate.   You develop leg or chest pain.   You have a fast pounding heart beat (palpitations).   You have severe headaches.   You develop vision problems.   You feel a lump in your breast.   You have abdominal pain or severe indigestion.  Document Released: 12/17/2003 Document Revised: 09/15/2011 Document Reviewed: 07/24/2008 [facility] Patient Information 2012 [place], [place]. Depression, Adolescent and Adult Depression is a true and treatable medical condition. In general there are two kinds of depression:  Depression we all experience in some form. For example depression from the death of a loved one, financial distress or natural disasters will trigger or increase depression.   Clinical depression, on the other hand, appears without an apparent cause or reason. This depression is a disease. Depression may be caused by chemical imbalance in the body and brain or may come as a response to a physical illness. Alcohol and other drugs can cause depression.  DIAGNOSIS  The diagnosis of depression is usually based upon symptoms and medical history. TREATMENT  Treatments for depression fall into three categories. These are:  Drug therapy. There are many medicines that treat depression. Responses may vary and sometimes trial and error is necessary to determine the best medicines and dosage for a particular patient.   Psychotherapy, also called talking treatments, helps people resolve their problems by looking at them from a different point of view and by giving people insight into their own personal makeup. Traditional psychotherapy looks at a childhood source of a problem. Other psychotherapy will look at current conflicts and move toward solving those. If the cause of depression is drug use, counseling is available to help abstain. In time the depression will usually improve. If there were underlying causes for the chemical use, they can be addressed.   ECT (electroconvulsive therapy) or  shock treatment is not as commonly used today. It is a very effective treatment for severe suicidal depression. During ECT electrical impulses are applied to the head. These impulses cause a generalized seizure. It can be effective but causes a loss of memory for recent events. Sometimes this loss of memory may include the last several months.  Treat all depression or suicide threats as serious. Obtain professional help. Do not wait to see if serious depression will get better over time without help. Seek help for yourself or those around you. In the [place] the number to the National Suicide Help Lines With 24 Hour Help Are: 1-800-SUICIDE (272) 709-3968 Document Released: 09/23/2000 Document Revised: 09/15/2011 Document Reviewed: 05/14/2008 [facility] Patient Information 2012 [place], [place].

## 2012-01-30 NOTE — Progress Notes (Signed)
  Subjective:    [name] is a 51 y.o. female who presents for an annual exam. C/O depression, crying, difficulty sleeping d/t increased stress related to various life situations. Denies SI/HI. The patient is sexually active. GYN screening history: last pap: approximate date 2011 and was normal and last mammogram: approximate date 2012 and was normal. The patient reports that there is not domestic violence in her life.   Menstrual History: OB History    Grav Para Term Preterm Abortions TAB SAB Ect Mult Living   1 1 1       1      No LMP recorded. Patient is postmenopausal.    The following portions of the patient's history were reviewed and updated as appropriate: allergies, current medications, past family history, past medical history, past social history, past surgical history and problem list.  Review of Systems  Constitutional: Positive for malaise/fatigue.  Cardiovascular: Negative.   Gastrointestinal: Negative.   Genitourinary: Negative.   Neurological: Negative.   Psychiatric/Behavioral: Positive for depression. Negative for suicidal ideas and substance abuse. The patient is nervous/anxious and has insomnia.      Objective:  Physical Exam  Nursing note and vitals reviewed. Constitutional: She is oriented to person, place, and time. She appears well-developed and well-nourished. No distress.  Cardiovascular: Normal rate, regular rhythm and normal heart sounds.   Pulmonary/Chest: Effort normal and breath sounds normal.  Abdominal: Soft. There is tenderness.  Genitourinary: Vagina normal. There is rash, tenderness and lesion on the right labia. There is rash, tenderness and lesion on the left labia. Uterus is not deviated, not enlarged, not fixed and not tender. Cervix exhibits no motion tenderness, no discharge and no friability. Right adnexum displays no mass, no tenderness and no fullness. Left adnexum displays no mass, no tenderness and no fullness. No vaginal discharge  found.  Musculoskeletal: Normal range of motion.  Neurological: She is alert and oriented to person, place, and time.  Skin: Skin is warm and dry.  Psychiatric:       Tearful during interview      Assessment:    Healthy female exam.  Depression   Plan:     Breast self exam technique reviewed and patient encouraged to perform self-exam monthly. Discussed healthy lifestyle modifications. Rx celexa 20 mg q day, recommend f/u with therapist/PCP, Pap due next year  Mammo due this year

## 2012-02-22 ENCOUNTER — Telehealth: Payer: Self-pay | Admitting: Family Medicine

## 2012-02-22 DIAGNOSIS — M545 Low back pain, unspecified: Secondary | ICD-10-CM

## 2012-02-22 DIAGNOSIS — M542 Cervicalgia: Secondary | ICD-10-CM

## 2012-02-22 NOTE — Telephone Encounter (Signed)
Patient called stating that she need a refill of her 10mg  oxycodone. Please assist.

## 2012-02-23 NOTE — Telephone Encounter (Signed)
Last filled on 4/19, #120 with 0 refills.  Substance controled contract signed

## 2012-02-23 NOTE — Telephone Encounter (Signed)
OK to refill on 5-17

## 2012-02-24 MED ORDER — OXYCODONE HCL 10 MG PO TABS: 10.0000 mg | ORAL_TABLET | Freq: Four times a day (QID) | ORAL | Status: DC | PRN

## 2012-02-24 NOTE — Telephone Encounter (Signed)
Pt called to check status of refill. Please contact when ready to pick up

## 2012-02-24 NOTE — Telephone Encounter (Signed)
Pt informed on VM Rx ready to pick-up 

## 2012-03-22 ENCOUNTER — Other Ambulatory Visit: Payer: Self-pay | Admitting: Family Medicine

## 2012-03-22 DIAGNOSIS — M542 Cervicalgia: Secondary | ICD-10-CM

## 2012-03-22 DIAGNOSIS — M545 Low back pain, unspecified: Secondary | ICD-10-CM

## 2012-03-22 NOTE — Telephone Encounter (Signed)
Last filled 02-24-2012, #120 with 0 refills

## 2012-03-22 NOTE — Telephone Encounter (Signed)
Patient called stating that she need a refill of her oxycodone. Please assist.  °

## 2012-03-22 NOTE — Telephone Encounter (Signed)
OK to refill by tomorrow. 

## 2012-03-23 MED ORDER — OXYCODONE HCL 10 MG PO TABS: 10.0000 mg | ORAL_TABLET | Freq: Four times a day (QID) | ORAL | Status: DC | PRN

## 2012-03-23 NOTE — Telephone Encounter (Signed)
Pt informed Rx ready to pick up

## 2012-03-26 ENCOUNTER — Encounter: Payer: Self-pay | Admitting: Family Medicine

## 2012-03-26 ENCOUNTER — Ambulatory Visit (INDEPENDENT_AMBULATORY_CARE_PROVIDER_SITE_OTHER): Payer: Self-pay | Admitting: Family Medicine

## 2012-03-26 VITALS — BP 118/78 | Temp 98.3°F | Wt 166.0 lb

## 2012-03-26 DIAGNOSIS — R509 Fever, unspecified: Secondary | ICD-10-CM

## 2012-03-26 MED ORDER — DOXYCYCLINE HYCLATE 100 MG PO TABS: 100.0000 mg | ORAL_TABLET | Freq: Two times a day (BID) | ORAL | Status: AC

## 2012-03-26 NOTE — Progress Notes (Signed)
  Subjective:    Patient ID: [name], female    DOB: 08/03/1961, 51 y.o.   MRN: [id number]  HPI  Acute visit. Patient seen with 2 day history of headache which is bifrontal and diffuse and low-grade fever. Body aches. She has some nasal congestion. Also is having cough. She has history of COPD. Cough mostly nonproductive-occasionally green sputum. She's had some wheezing more than her baseline. Denies any tick bites. Minimal sore throat. No nausea or vomiting. No skin rashes. He takes oxycodone regularly for chronic back pain and this has not relieved her headache.   Review of Systems  Constitutional: Positive for fever, chills and fatigue.  HENT: Positive for congestion.   Respiratory: Positive for cough.   Cardiovascular: Negative for chest pain.  Gastrointestinal: Negative for abdominal pain.  Neurological: Positive for headaches.  Hematological: Negative for adenopathy. Does not bruise/bleed easily.       Objective:   Physical Exam  Constitutional: She appears well-developed and well-nourished.  HENT:  Right Ear: External ear normal.  Left Ear: External ear normal.  Mouth/Throat: Oropharynx is clear and moist.  Neck: Neck supple.  Cardiovascular: Normal rate and regular rhythm.   Pulmonary/Chest: Effort normal and breath sounds normal. No respiratory distress. She has no wheezes. She has no rales.  Lymphadenopathy:    She has no cervical adenopathy.  Skin: No rash noted.          Assessment & Plan:  Febrile illness. Possibly viral. She does have history of COPD with increased cough. No clinical indicator of pneumonia this time. With productive cough start doxycycline 100 mg twice daily for 10 days. Depo-Medrol 80 mg IM given for wheezing.

## 2012-03-26 NOTE — Patient Instructions (Addendum)
Follow up immediately for any vomiting, stiff neck, or any other worsening symptoms.

## 2012-04-11 ENCOUNTER — Other Ambulatory Visit: Payer: Self-pay | Admitting: *Deleted

## 2012-04-11 NOTE — Telephone Encounter (Signed)
Alprazolam last filled 01-11-2012, #75 with 2 refills

## 2012-04-11 NOTE — Telephone Encounter (Signed)
Refill for 3 months. 

## 2012-04-13 ENCOUNTER — Telehealth: Payer: Self-pay | Admitting: Family Medicine

## 2012-04-13 MED ORDER — ALPRAZOLAM 2 MG PO TABS: ORAL_TABLET | ORAL | Status: DC

## 2012-04-13 NOTE — Telephone Encounter (Signed)
Pt informed Rx called in. 

## 2012-04-13 NOTE — Telephone Encounter (Signed)
Patient called stating that she need a refill of her xanax. Please assist.

## 2012-04-18 ENCOUNTER — Other Ambulatory Visit: Payer: Self-pay | Admitting: Family Medicine

## 2012-04-18 DIAGNOSIS — M542 Cervicalgia: Secondary | ICD-10-CM

## 2012-04-18 DIAGNOSIS — M545 Low back pain, unspecified: Secondary | ICD-10-CM

## 2012-04-18 NOTE — Telephone Encounter (Signed)
Pt needs new rx oxycodone. Pt is aware MD out of Monday.

## 2012-04-18 NOTE — Telephone Encounter (Signed)
Left a message on personally identified VM, she is due for refill on Sunday, 7/14.  Is she OK to wait until Monday 7/15 for her written refill when Dr Caryl Never is back?

## 2012-04-19 MED ORDER — OXYCODONE HCL 10 MG PO TABS: 10.0000 mg | ORAL_TABLET | Freq: Four times a day (QID) | ORAL | Status: DC | PRN

## 2012-04-19 NOTE — Telephone Encounter (Signed)
OK for 30 day supply to pick up Monday

## 2012-04-19 NOTE — Telephone Encounter (Signed)
Pt informed Rx ready to pick-up tomorrow 

## 2012-04-19 NOTE — Telephone Encounter (Signed)
Pt called back, she does need her Oxycodone before Monday as she is leaving for the beach on Saturday for a week.

## 2012-05-18 ENCOUNTER — Other Ambulatory Visit: Payer: Self-pay | Admitting: Family Medicine

## 2012-05-18 DIAGNOSIS — M545 Low back pain, unspecified: Secondary | ICD-10-CM

## 2012-05-18 DIAGNOSIS — M542 Cervicalgia: Secondary | ICD-10-CM

## 2012-05-18 NOTE — Telephone Encounter (Signed)
Pt needs new rx oxycodone 10 mg. Pt is due on 05-21-2012

## 2012-05-18 NOTE — Telephone Encounter (Signed)
OK to refill on 05-21-12

## 2012-05-21 MED ORDER — OXYCODONE HCL 10 MG PO TABS: 10.0000 mg | ORAL_TABLET | Freq: Four times a day (QID) | ORAL | Status: DC | PRN

## 2012-05-21 NOTE — Telephone Encounter (Signed)
Pt informed ready to pick up

## 2012-06-19 ENCOUNTER — Telehealth: Payer: Self-pay | Admitting: Family Medicine

## 2012-06-19 NOTE — Telephone Encounter (Signed)
Pt requesting refill on oxycodone. Please call when ready for pick up. Pt states she runs out 9-12.

## 2012-06-19 NOTE — Telephone Encounter (Signed)
Pt called to check on Rx, and wanted to ask Dr Caryl Never to authorize more as she had to take another job and is now taking 2 at a time.  I explained to pt he would like to discuss with her first.  Scheduled for 2:45 tomorrow.  FYI

## 2012-06-20 ENCOUNTER — Ambulatory Visit (INDEPENDENT_AMBULATORY_CARE_PROVIDER_SITE_OTHER): Payer: Self-pay | Admitting: Family Medicine

## 2012-06-20 ENCOUNTER — Encounter: Payer: Self-pay | Admitting: Family Medicine

## 2012-06-20 VITALS — BP 140/90 | Temp 98.0°F | Wt 163.0 lb

## 2012-06-20 DIAGNOSIS — M545 Low back pain, unspecified: Secondary | ICD-10-CM

## 2012-06-20 MED ORDER — OXYCODONE HCL 15 MG PO TABS: 15.0000 mg | ORAL_TABLET | Freq: Four times a day (QID) | ORAL | Status: DC | PRN

## 2012-06-20 NOTE — Progress Notes (Signed)
  Subjective:    Patient ID: [name], female    DOB: Jan 01, 1961, 51 y.o.   MRN: [id number]  HPI  Patient here to discuss pain management issues. She for several years has taken oxycodone for chronic low back pain. She has not had MRI scan since 2007 and she had degenerative changes then. She has been without insurance and unable to get any followup studies. She has pain which is daily and constant lower back with some radiation intermittently right lower extremity. No incontinence. No numbness. No weakness. Pain is 8-10 out of 10 frequently. She has recently gotten very poor control with oxycodone 10 mg. She's previously tried Lyrica, Neurontin, hydrocodone without relief. She has not had relief with Tylenol. No relief with nonsteroidals.  Occasional constipation usually relieved with stool softeners and occasional MiraLax.  Past Medical History  Diagnosis Date  . PANIC DISORDER 04/18/2007  . TOBACCO ABUSE 07/17/2009  . CARPAL TUNNEL SYNDROME, BILATERAL 01/16/2008  . ASTHMA UNSPECIFIED WITH EXACERBATION 07/21/2010  . COPD 05/08/2008  . GERD 04/18/2007  . HELICOBACTER PYLORI GASTRITIS, HX OF 04/18/2007   Past Surgical History  Procedure Date  . Appendectomy   . Oophorectomy     reports that she has been smoking Cigarettes.  She has a 50 pack-year smoking history. She does not have any smokeless tobacco history on file. Her alcohol and drug histories not on file. family history includes Cancer in her father and Diabetes in her mother. Allergies  Allergen Reactions  . Aspirin     REACTION: had h. pylori due to too many goody powders. told not to take any more asa      Review of Systems  Constitutional: Negative for appetite change and unexpected weight change.  Gastrointestinal: Negative for abdominal pain.  Genitourinary: Negative for dysuria.  Musculoskeletal: Positive for back pain.  Neurological: Negative for weakness and numbness.       Objective:   Physical Exam    Constitutional: She appears well-developed and well-nourished.  Cardiovascular: Normal rate and regular rhythm.   Pulmonary/Chest: Effort normal and breath sounds normal. No respiratory distress. She has no wheezes. She has no rales.  Musculoskeletal: She exhibits no edema.       Straight leg raise is negative.  Neurological:       No focal weakness lower extremities. She has 1+ symmetric reflexes knee and ankle bilaterally. Normal sensory function of touch.          Assessment & Plan:  Chronic low back pain. Pain medication contract is completed. Titrate oxycodone 15 mg every 6 hours. Touch base in 2 to 3 weeks for followup. Patient currently trying to get insurance and she is encouraged to schedule physical afterwards

## 2012-07-10 ENCOUNTER — Other Ambulatory Visit: Payer: Self-pay | Admitting: Family Medicine

## 2012-07-10 NOTE — Telephone Encounter (Signed)
Refill for 3 months. 

## 2012-07-10 NOTE — Telephone Encounter (Signed)
Alprazolam, 2 and 1/2 tabs daily, last filled #75 with 2 refills.  Pt had OV 06/20/12

## 2012-07-11 ENCOUNTER — Telehealth: Payer: Self-pay | Admitting: Family Medicine

## 2012-07-11 NOTE — Telephone Encounter (Signed)
Pt called and has changed pharmacies to [facility] on [address] [place] [phone].

## 2012-07-11 NOTE — Telephone Encounter (Signed)
noted 

## 2012-07-12 ENCOUNTER — Telehealth: Payer: Self-pay | Admitting: Family Medicine

## 2012-07-12 MED ORDER — ALPRAZOLAM 2 MG PO TABS: ORAL_TABLET | ORAL | Status: DC

## 2012-07-12 NOTE — Telephone Encounter (Signed)
I called  Pt [facility] had filled the med, pt had NOT picked it up.  Asked [facility] Pharmacist to cancel the order, pt now using [facility] pharmacy.  Called in Alprazolam to [facility] pharmacy

## 2012-07-12 NOTE — Telephone Encounter (Signed)
Pt called to check on status of getting script for alprazolam [name]) 2 MG tablet transferred to her new pharmacy. Pls send to [name] [address] [place] [phone]. Pt req to be called when this has been done.

## 2012-07-17 ENCOUNTER — Other Ambulatory Visit: Payer: Self-pay | Admitting: Family Medicine

## 2012-07-17 NOTE — Telephone Encounter (Addendum)
May refill by the 10th.  Is her pain control improved at increased dose? Refill for one month

## 2012-07-17 NOTE — Telephone Encounter (Signed)
Pt needs new rx oxycodone 15 mg °

## 2012-07-17 NOTE — Telephone Encounter (Signed)
Oxycodone was titrated from 10mg  up to 15mg  at OV on 9/11.  #120 with 0 refills given at OV.

## 2012-07-20 MED ORDER — OXYCODONE HCL 15 MG PO TABS: 15.0000 mg | ORAL_TABLET | Freq: Four times a day (QID) | ORAL | Status: DC | PRN

## 2012-07-20 NOTE — Addendum Note (Signed)
Addended by: Melchor Amour on: 07/20/2012 08:50 AM   Modules accepted: Orders

## 2012-07-23 ENCOUNTER — Other Ambulatory Visit: Payer: Self-pay | Admitting: Family Medicine

## 2012-08-02 ENCOUNTER — Other Ambulatory Visit: Payer: Self-pay | Admitting: Family Medicine

## 2012-08-02 DIAGNOSIS — Z1231 Encounter for screening mammogram for malignant neoplasm of breast: Secondary | ICD-10-CM

## 2012-08-14 ENCOUNTER — Telehealth: Payer: Self-pay | Admitting: Family Medicine

## 2012-08-14 DIAGNOSIS — M542 Cervicalgia: Secondary | ICD-10-CM

## 2012-08-14 DIAGNOSIS — M545 Low back pain, unspecified: Secondary | ICD-10-CM

## 2012-08-14 NOTE — Telephone Encounter (Signed)
Patient called stating that she need a refill of her oxycodone as she will be with her brother who is having surgery and would like to pick this up on Friday. Please assist.

## 2012-08-14 NOTE — Telephone Encounter (Signed)
OK to refill this Friday.

## 2012-08-14 NOTE — Telephone Encounter (Signed)
Oxycodone last filled 07-20-12, #120 with 0 refills

## 2012-08-16 NOTE — Telephone Encounter (Signed)
Patient would like to pick this up at 8:30 am on Friday if possible per previous message.

## 2012-08-17 MED ORDER — OXYCODONE HCL 10 MG PO TABS: 10.0000 mg | ORAL_TABLET | Freq: Four times a day (QID) | ORAL | Status: DC | PRN

## 2012-08-17 MED ORDER — OXYCODONE HCL 15 MG PO TABS: 15.0000 mg | ORAL_TABLET | Freq: Four times a day (QID) | ORAL | Status: DC | PRN

## 2012-08-17 NOTE — Telephone Encounter (Signed)
Pt was titrated up to 15 mg oxycodone, 10 mg was discontinued

## 2012-08-21 ENCOUNTER — Ambulatory Visit (HOSPITAL_COMMUNITY)
Admission: RE | Admit: 2012-08-21 | Discharge: 2012-08-21 | Disposition: A | Discharge status: Home / Self Care | Payer: Self-pay | Source: Ambulatory Visit | Attending: Family Medicine | Admitting: Family Medicine

## 2012-08-21 DIAGNOSIS — Z1231 Encounter for screening mammogram for malignant neoplasm of breast: Secondary | ICD-10-CM

## 2012-09-11 ENCOUNTER — Other Ambulatory Visit: Payer: Self-pay | Admitting: Family Medicine

## 2012-09-11 DIAGNOSIS — M545 Low back pain, unspecified: Secondary | ICD-10-CM

## 2012-09-11 DIAGNOSIS — M542 Cervicalgia: Secondary | ICD-10-CM

## 2012-09-11 NOTE — Telephone Encounter (Signed)
Pt needs new rx oxycodone 15 mg. Pt is due friday

## 2012-09-12 NOTE — Telephone Encounter (Signed)
Refill by Friday 

## 2012-09-12 NOTE — Telephone Encounter (Signed)
Oxycodone 10 mg last filled 08/17/12, #120 with 0 refills

## 2012-09-14 ENCOUNTER — Other Ambulatory Visit: Payer: Self-pay | Admitting: *Deleted

## 2012-09-14 ENCOUNTER — Telehealth: Payer: Self-pay | Admitting: Family Medicine

## 2012-09-14 MED ORDER — OXYCODONE HCL 15 MG PO TABS: 15.0000 mg | ORAL_TABLET | Freq: Four times a day (QID) | ORAL | Status: DC | PRN

## 2012-09-14 MED ORDER — OXYCODONE HCL 10 MG PO TABS: 10.0000 mg | ORAL_TABLET | Freq: Four times a day (QID) | ORAL | Status: DC | PRN

## 2012-09-14 NOTE — Telephone Encounter (Signed)
Pt informed us that she was increased to 15 mg Oxycydone at her last OV.  Reviewed Sept office note and that is correct.  Printed new Rx and discarded the 10 mg script.

## 2012-09-14 NOTE — Addendum Note (Signed)
Addended by: Melchor Amour on: 09/14/2012 08:19 AM   Modules accepted: Orders

## 2012-09-14 NOTE — Telephone Encounter (Signed)
Pt informed Rx ready to pick up

## 2012-09-14 NOTE — Telephone Encounter (Signed)
Opened in error

## 2012-10-08 ENCOUNTER — Telehealth: Payer: Self-pay | Admitting: Family Medicine

## 2012-10-08 NOTE — Telephone Encounter (Signed)
Patient called stating that she need a refill of her oxycodone 15mg  1po q6hrs prn for pain. Please assist.

## 2012-10-08 NOTE — Telephone Encounter (Signed)
Confirm, but I think this was refilled on 09-14-12.  If so, not due until 10-14-12.

## 2012-10-09 ENCOUNTER — Other Ambulatory Visit: Payer: Self-pay

## 2012-10-09 ENCOUNTER — Telehealth: Payer: Self-pay | Admitting: Family Medicine

## 2012-10-09 MED ORDER — ALPRAZOLAM 2 MG PO TABS: ORAL_TABLET | ORAL | Status: DC

## 2012-10-09 NOTE — Telephone Encounter (Signed)
I called in script with the refill date for 10/13/12.

## 2012-10-09 NOTE — Telephone Encounter (Signed)
Not due until 10-14-11.  May refill for 3 months then.

## 2012-10-09 NOTE — Addendum Note (Signed)
Addended by: Aniceto Boss A on: 10/09/2012 03:41 PM   Modules accepted: Orders

## 2012-10-09 NOTE — Telephone Encounter (Signed)
Pt would like to pick up script on 10/12/12 for Oxycodone, since the due date is on a weekend. Call pt when ready for pick up.

## 2012-10-09 NOTE — Telephone Encounter (Signed)
Refill request for Xanax 2mg . Last filled 09/13/2012. Last OV 06/20/12

## 2012-10-10 NOTE — Telephone Encounter (Signed)
OK to refill then

## 2012-10-12 MED ORDER — OXYCODONE HCL 15 MG PO TABS: 15.0000 mg | ORAL_TABLET | Freq: Four times a day (QID) | ORAL | Status: DC | PRN

## 2012-10-12 NOTE — Telephone Encounter (Signed)
Pt informed Rx ready to pick up

## 2012-10-12 NOTE — Addendum Note (Signed)
Addended by: Melchor Amour on: 10/12/2012 09:38 AM   Modules accepted: Orders

## 2012-11-06 ENCOUNTER — Other Ambulatory Visit: Payer: Self-pay | Admitting: Family Medicine

## 2012-11-06 NOTE — Telephone Encounter (Signed)
Pt will be due for refill on Saturday, will route later this week.

## 2012-11-06 NOTE — Telephone Encounter (Signed)
This is too early. Why is she calling this early for refills?

## 2012-11-06 NOTE — Telephone Encounter (Signed)
Oxycodone last filled 10/12/12, #120 with 0 refills

## 2012-11-06 NOTE — Telephone Encounter (Signed)
Pt needs new rx oxycodone 15 mg.

## 2012-11-08 ENCOUNTER — Telehealth: Payer: Self-pay | Admitting: Family Medicine

## 2012-11-08 NOTE — Telephone Encounter (Signed)
Patient understands Dr. B will be gone soon. She states she'll need refill on XANAX on 2/5 - would like it to be done before he leaves. She said that last time someone else did the refill and it went to the wrong pharm. Please send to [facility].

## 2012-11-08 NOTE — Telephone Encounter (Signed)
Refill tomorrow (Friday)

## 2012-11-08 NOTE — Telephone Encounter (Signed)
If I am reading records of refills accurately, looks like she got refill on 10-09-12 with 2 additional refills which should take her through end of March.  Please clarify.

## 2012-11-09 MED ORDER — OXYCODONE HCL 15 MG PO TABS: 15.0000 mg | ORAL_TABLET | Freq: Four times a day (QID) | ORAL | Status: DC | PRN

## 2012-11-09 NOTE — Telephone Encounter (Signed)
[facility] has 2 refills on Alprazolam that was called in December by [name], they forgot to enter.  [facility] in [facility].  Pt informed

## 2012-11-09 NOTE — Telephone Encounter (Signed)
Pt informed ready to pick up

## 2012-12-05 ENCOUNTER — Telehealth: Payer: Self-pay | Admitting: Family Medicine

## 2012-12-05 NOTE — Telephone Encounter (Signed)
Pt  Would like refill of oxyCODONE (ROXICODONE) 15 MG immediate release tablet

## 2012-12-05 NOTE — Telephone Encounter (Signed)
Oxycodone 15 last filled 11-09-12, #120 with 0 refills

## 2012-12-06 ENCOUNTER — Telehealth: Payer: Self-pay | Admitting: Family Medicine

## 2012-12-06 NOTE — Telephone Encounter (Signed)
May refill by tomorrow 

## 2012-12-06 NOTE — Telephone Encounter (Signed)
Opened in error

## 2012-12-07 MED ORDER — OXYCODONE HCL 15 MG PO TABS: 15.0000 mg | ORAL_TABLET | Freq: Four times a day (QID) | ORAL | Status: DC | PRN

## 2012-12-07 NOTE — Telephone Encounter (Signed)
Pt following up on request. Would like to pick up today, Friday after 1pm. Pt starting new job and cannot come in [name].

## 2012-12-07 NOTE — Telephone Encounter (Signed)
Pt informed ready to pick up

## 2013-01-01 ENCOUNTER — Telehealth: Payer: Self-pay | Admitting: Family Medicine

## 2013-01-01 NOTE — Telephone Encounter (Signed)
Patient called stating that she need a refill of her oxycodone 15mg  1po 4 times qd prn for pain and also need a written rx for xanax as she is changing pharmacies. Please assist.

## 2013-01-01 NOTE — Telephone Encounter (Signed)
Oxycodone due 3/28.  Why is this med not on her med list?

## 2013-01-01 NOTE — Telephone Encounter (Signed)
Pls advise.  

## 2013-01-04 MED ORDER — ALPRAZOLAM 2 MG PO TABS: ORAL_TABLET | ORAL | Status: DC

## 2013-01-04 MED ORDER — OXYCODONE HCL 15 MG PO TABS: 15.0000 mg | ORAL_TABLET | Freq: Four times a day (QID) | ORAL | Status: DC | PRN

## 2013-01-04 NOTE — Telephone Encounter (Signed)
Pt here to pick up Rx.

## 2013-01-29 ENCOUNTER — Telehealth: Payer: Self-pay | Admitting: Family Medicine

## 2013-01-29 NOTE — Telephone Encounter (Signed)
Pt needs new rx oxycodone 15 mg will pick up friday

## 2013-01-31 NOTE — Telephone Encounter (Signed)
OK to refill by tomorrow as she should run out over the weekend.

## 2013-01-31 NOTE — Telephone Encounter (Signed)
Oxycodone last filled 01/04/13 (March had 31 days).  #120 with 0 refills

## 2013-02-01 ENCOUNTER — Telehealth: Payer: Self-pay | Admitting: Family Medicine

## 2013-02-01 MED ORDER — OXYCODONE HCL 15 MG PO TABS: 15.0000 mg | ORAL_TABLET | Freq: Four times a day (QID) | ORAL | Status: AC | PRN

## 2013-02-01 NOTE — Telephone Encounter (Signed)
Pt would like to change pharm to [facility]. [address], [place], [place]

## 2013-02-01 NOTE — Telephone Encounter (Signed)
changed

## 2013-02-21 ENCOUNTER — Encounter: Payer: Self-pay | Admitting: Family Medicine

## 2013-02-21 ENCOUNTER — Ambulatory Visit (INDEPENDENT_AMBULATORY_CARE_PROVIDER_SITE_OTHER): Payer: No Typology Code available for payment source | Admitting: Family Medicine

## 2013-02-21 VITALS — BP 110/78 | Temp 98.3°F | Wt 163.0 lb

## 2013-02-21 DIAGNOSIS — IMO0002 Reserved for concepts with insufficient information to code with codable children: Secondary | ICD-10-CM

## 2013-02-21 DIAGNOSIS — E785 Hyperlipidemia, unspecified: Secondary | ICD-10-CM

## 2013-02-21 DIAGNOSIS — J449 Chronic obstructive pulmonary disease, unspecified: Secondary | ICD-10-CM

## 2013-02-21 DIAGNOSIS — R739 Hyperglycemia, unspecified: Secondary | ICD-10-CM

## 2013-02-21 DIAGNOSIS — R7309 Other abnormal glucose: Secondary | ICD-10-CM

## 2013-02-21 DIAGNOSIS — M5416 Radiculopathy, lumbar region: Secondary | ICD-10-CM

## 2013-02-21 LAB — HEPATIC FUNCTION PANEL
ALT: 16 U/L (ref 0–35)
AST: 18 U/L (ref 0–37)
Alkaline Phosphatase: 76 U/L (ref 39–117)
Bilirubin, Direct: 0 mg/dL (ref 0.0–0.3)
Total Bilirubin: 0.5 mg/dL (ref 0.3–1.2)
Total Protein: 7.1 g/dL (ref 6.0–8.3)

## 2013-02-21 LAB — BASIC METABOLIC PANEL
CO2: 28 mEq/L (ref 19–32)
Chloride: 101 mEq/L (ref 96–112)
Creatinine, Ser: 0.7 mg/dL (ref 0.4–1.2)
Potassium: 4 mEq/L (ref 3.5–5.1)
Sodium: 136 mEq/L (ref 135–145)

## 2013-02-21 LAB — LIPID PANEL
Total CHOL/HDL Ratio: 11
Triglycerides: 1002 mg/dL — ABNORMAL HIGH (ref 0.0–149.0)

## 2013-02-21 LAB — TSH: TSH: 0.78 u[IU]/mL (ref 0.35–5.50)

## 2013-02-21 LAB — LDL CHOLESTEROL, DIRECT: Direct LDL: 71.5 mg/dL

## 2013-02-21 MED ORDER — BECLOMETHASONE DIPROPIONATE 40 MCG/ACT IN AERS: 2.0000 | INHALATION_SPRAY | Freq: Two times a day (BID) | RESPIRATORY_TRACT | Status: DC

## 2013-02-21 NOTE — Patient Instructions (Addendum)
We will call you regarding MRI scan. 

## 2013-02-21 NOTE — Progress Notes (Signed)
  Subjective:    Patient ID: [name], female    DOB: Nov 25, 1960, 52 y.o.   MRN: [id number]  HPI  Chronic low back pain Worsening lumbar pain over 2 months No injury.  Now has sharp pain radiating down RLE to foot Some numbness foot. Some weakness. Pain 8/10 even on high dose Oxycodone which she has been on for years  Patient has many other chronic medical problems including history of chronic anxiety, COPD with ongoing nicotine use, GERD, degenerative arthritis, dyslipidemia, and hyperglycemia. She has some intermittent wheezing. She's been noncompliant with her usual inhalers. She states Spiriva made her feel more nervous. She has seen improvement with steroid inhaler previously but is not taking currently.  No history of CAD or peripheral vascular disease. She takes lovastatin. Previous intolerance to Lipitor. Cannot take Crestor secondary to cost  Past Medical History  Diagnosis Date  . PANIC DISORDER 04/18/2007  . TOBACCO ABUSE 07/17/2009  . CARPAL TUNNEL SYNDROME, BILATERAL 01/16/2008  . ASTHMA UNSPECIFIED WITH EXACERBATION 07/21/2010  . COPD 05/08/2008  . GERD 04/18/2007  . HELICOBACTER PYLORI GASTRITIS, HX OF 04/18/2007   Past Surgical History  Procedure Laterality Date  . Appendectomy    . Oophorectomy      reports that she has been smoking Cigarettes.  She has a 50 pack-year smoking history. She does not have any smokeless tobacco history on file. Her alcohol and drug histories are not on file. family history includes Cancer in her father and Diabetes in her mother. Allergies  Allergen Reactions  . Aspirin     REACTION: had h. pylori due to too many goody powders. told not to take any more asa     Review of Systems  Constitutional: Negative for fatigue.  HENT: Negative for trouble swallowing.   Eyes: Negative for visual disturbance.  Respiratory: Negative for cough, chest tightness, shortness of breath and wheezing.   Cardiovascular: Negative for chest pain,  palpitations and leg swelling.  Musculoskeletal: Positive for back pain.  Neurological: Positive for weakness and numbness. Negative for dizziness, seizures, syncope, light-headedness and headaches.  Psychiatric/Behavioral: Positive for sleep disturbance.       Objective:   Physical Exam  Constitutional: She appears well-developed and well-nourished.  Neck: Neck supple. No thyromegaly present.  Cardiovascular: Normal rate and regular rhythm.   Pulmonary/Chest: Effort normal and breath sounds normal. No respiratory distress. She has no wheezes. She has no rales.  Musculoskeletal: She exhibits no edema.  Straight leg raises are negative.  Neurological:  Full-strength lower extremities. 2+ reflexes knee and ankle bilaterally. Normal sensory function to touch          Assessment & Plan:  Chronic back pain with recent worsening. She has right lower extremity radiculopathy symptoms with reported numbness and weakness though nonfocal exam today. She's had progressive symptoms over 2 months not relieved with medication. Set up MRI further assess  Hyperlipidemia. Currently takes lovastatin. Recheck lipid panel  History of hyperglycemia. Repeat fasting blood sugar and hemoglobin A1c  COPD. Ongoing nicotine use. Low motivation to quit. She is encouraged to get back on Spiriva and steroid inhaler

## 2013-02-22 ENCOUNTER — Other Ambulatory Visit: Payer: Self-pay | Admitting: *Deleted

## 2013-02-22 DIAGNOSIS — E785 Hyperlipidemia, unspecified: Secondary | ICD-10-CM

## 2013-02-22 MED ORDER — FENOFIBRATE 145 MG PO TABS: 145.0000 mg | ORAL_TABLET | Freq: Every day | ORAL | Status: DC

## 2013-02-22 NOTE — Progress Notes (Signed)
Quick Note:  pt called right back, informed, future labs ordered, med sent to pharmacy ______

## 2013-02-22 NOTE — Progress Notes (Signed)
Quick Note:  LMTCB on VM, Rx was sent to her pharmacy 5/16 ______

## 2013-02-26 ENCOUNTER — Other Ambulatory Visit: Payer: Self-pay | Admitting: Family Medicine

## 2013-02-26 NOTE — Telephone Encounter (Signed)
PT called to request a refill of her oxyCODONE (ROXICODONE) 15 MG immediate release tablet. PT is aware that she's not due for a refill until this weekend but would like to pick it up Friday. PT is also requesting a RX of Prednisone. She stated that when he offered it, she declined, but has since changed her mind. Please assist.

## 2013-02-28 NOTE — Telephone Encounter (Signed)
Refill tomorrow.

## 2013-02-28 NOTE — Telephone Encounter (Signed)
Oxycodone last filled 02-01-13, (5/24 is Sunday) #120 with 0 refills

## 2013-03-01 MED ORDER — OXYCODONE HCL 15 MG PO TABS: 15.0000 mg | ORAL_TABLET | Freq: Four times a day (QID) | ORAL | Status: DC | PRN

## 2013-03-01 NOTE — Telephone Encounter (Signed)
Rx ready for pick up.  This was an early refill due to the holiday.

## 2013-03-03 ENCOUNTER — Ambulatory Visit
Admission: RE | Admit: 2013-03-03 | Discharge: 2013-03-03 | Disposition: A | Discharge status: Home / Self Care | Payer: No Typology Code available for payment source | Source: Ambulatory Visit | Attending: Family Medicine | Admitting: Family Medicine

## 2013-03-03 DIAGNOSIS — M5416 Radiculopathy, lumbar region: Secondary | ICD-10-CM

## 2013-03-06 ENCOUNTER — Telehealth: Payer: Self-pay | Admitting: Family Medicine

## 2013-03-06 NOTE — Progress Notes (Signed)
Quick Note:  Called and spoke with pt and pt is aware. Pt states she will return call if she wants to see Neurosurgeon. ______

## 2013-03-06 NOTE — Progress Notes (Signed)
Quick Note:  Pt informed on personally identified VM ______ 

## 2013-03-06 NOTE — Telephone Encounter (Signed)
Called and spoke with pt about lab results.  Pt states that her pharmacy will send a new cholesterol medication that will be a $5 dollar copay instead of the one that was sent recently.

## 2013-03-06 NOTE — Telephone Encounter (Signed)
PT would like to receive her results regarding her MRI. PT states that she will be available to reach on this number until 5 pm. Following that she can be reached on her cell phone. Please assist.

## 2013-03-07 NOTE — Telephone Encounter (Signed)
Yes.  OK to make requested switch.

## 2013-03-07 NOTE — Telephone Encounter (Signed)
We received a fax requesting "can we switch to fenofibrate 134 mg caps (generic antara) for $5.00 in place of fenofibrate 145 mg (generic tricor) $55.00

## 2013-03-08 MED ORDER — FENOFIBRATE MICRONIZED 134 MG PO CAPS: 134.0000 mg | ORAL_CAPSULE | Freq: Every day | ORAL | Status: DC

## 2013-03-08 NOTE — Addendum Note (Signed)
Addended by: Melchor Amour on: 03/08/2013 09:05 AM   Modules accepted: Orders, Medications

## 2013-03-26 ENCOUNTER — Telehealth: Payer: Self-pay | Admitting: Family Medicine

## 2013-03-26 NOTE — Telephone Encounter (Signed)
PT called to request a refill of her oxyCODONE (ROXICODONE) 15 MG immediate release tablet. She stated that it is not due to be refilled until Saturday 04/01/13, but she would like to pick it up Friday morning. Please assist.

## 2013-03-27 NOTE — Telephone Encounter (Signed)
Last filled 5/23.  May and June both had 31 days

## 2013-03-28 NOTE — Telephone Encounter (Signed)
Pt would like to pick up Friday before 9am.  Pls advise!

## 2013-03-29 MED ORDER — OXYCODONE HCL 15 MG PO TABS: 15.0000 mg | ORAL_TABLET | Freq: Four times a day (QID) | ORAL | Status: DC | PRN

## 2013-03-29 NOTE — Telephone Encounter (Signed)
Pt here to pick up Rx.

## 2013-03-29 NOTE — Telephone Encounter (Signed)
Refill OK

## 2013-04-03 ENCOUNTER — Other Ambulatory Visit: Payer: Self-pay | Admitting: Family Medicine

## 2013-04-03 NOTE — Telephone Encounter (Signed)
Refill for 3 months. 

## 2013-04-03 NOTE — Telephone Encounter (Signed)
Rx request phoned to pharmacy[facility]  

## 2013-04-03 NOTE — Telephone Encounter (Signed)
Request for Alprazolam Last Rx: 03.28.14, #75x2 Last OV: 05.15.14 Please advise on refills/[facility]

## 2013-04-18 ENCOUNTER — Encounter: Payer: Self-pay | Admitting: Family Medicine

## 2013-04-18 ENCOUNTER — Ambulatory Visit (INDEPENDENT_AMBULATORY_CARE_PROVIDER_SITE_OTHER): Payer: No Typology Code available for payment source | Admitting: Family Medicine

## 2013-04-18 VITALS — BP 120/76 | HR 77 | Temp 97.7°F | Ht 67.0 in | Wt 162.0 lb

## 2013-04-18 DIAGNOSIS — R300 Dysuria: Secondary | ICD-10-CM

## 2013-04-18 LAB — POCT URINALYSIS DIPSTICK
Bilirubin, UA: NEGATIVE
Glucose, UA: NEGATIVE
Ketones, UA: NEGATIVE
Nitrite, UA: NEGATIVE
Spec Grav, UA: >=1.03
pH, UA: 5.5

## 2013-04-18 NOTE — Progress Notes (Signed)
  Subjective:    Patient ID: [name], female    DOB: 01/05/1961, 52 y.o.   MRN: [id number]  HPI Acute visit. Dysuria for the past month off and on Denies vaginal discharge. No fevers or chills. No nausea or vomiting. Underwent menopause age 56. She sees gynecologist for Pap smear every other year No alleviating factors. No exacerbating factors. She was concerned about possible UTI.  Past Medical History  Diagnosis Date  . PANIC DISORDER 04/18/2007  . TOBACCO ABUSE 07/17/2009  . CARPAL TUNNEL SYNDROME, BILATERAL 01/16/2008  . ASTHMA UNSPECIFIED WITH EXACERBATION 07/21/2010  . COPD 05/08/2008  . GERD 04/18/2007  . HELICOBACTER PYLORI GASTRITIS, HX OF 04/18/2007   Past Surgical History  Procedure Laterality Date  . Appendectomy    . Oophorectomy      reports that she has been smoking Cigarettes.  She has a 50 pack-year smoking history. She does not have any smokeless tobacco history on file. Her alcohol and drug histories are not on file. family history includes Cancer in her father and Diabetes in her mother. Allergies  Allergen Reactions  . Aspirin     REACTION: had h. pylori due to too many goody powders. told not to take any more asa      Review of Systems  Constitutional: Negative for fever, chills and appetite change.  Gastrointestinal: Negative for nausea, vomiting, abdominal pain, diarrhea and constipation.  Genitourinary: Positive for dysuria and frequency.  Musculoskeletal: Negative for back pain.  Neurological: Negative for dizziness.       Objective:   Physical Exam  Constitutional: She appears well-developed and well-nourished.  Cardiovascular: Normal rate and regular rhythm.   Pulmonary/Chest: Effort normal and breath sounds normal. No respiratory distress. She has no wheezes. She has no rales.          Assessment & Plan:  Dysuria. Urine dipstick reveals no leukocytes or nitrites. Suspect atrophic vaginitis. Discussed possible use of topical estrogen at  this point she wishes to defer. She will discuss with her gynecologist at followup

## 2013-04-18 NOTE — Patient Instructions (Addendum)
Follow up promptly for any fever or worsening symptoms You may have some atrophic vaginitis which is seen in postmenopausal women and is due to decreased estrogen.  Atrophic Vaginitis Atrophic vaginitis is a problem of low levels of estrogen in women. This problem can happen at any age. It is most common in women who have gone through menopause ("the change").  HOW WILL I KNOW IF I HAVE THIS PROBLEM? You may have:  Trouble with peeing (urinating), such as:  Going to the bathroom often.  A hard time holding your pee until you reach a bathroom.  Leaking pee.  Having pain when you pee.  Itching or a burning feeling.  Vaginal bleeding and spotting.  Pain during sex.  Dryness of the vagina.  A yellow, bad-smelling fluid (discharge) coming from the vagina. HOW WILL MY DOCTOR CHECK FOR THIS PROBLEM?  During your exam, your doctor will likely find the problem.  If there is a vaginal fluid, it may be checked for infection. HOW WILL THIS PROBLEM BE TREATED? Keep the vulvar skin as clean as possible. Moisturizers and lubricants can help with some of the symptoms. Estrogen replacement can help. There are 2 ways to take estrogen:  Systemic estrogen gets estrogen to your whole body. It takes many weeks or months before the symptoms get better.  You take an estrogen pill.  You use a skin patch. This is a patch that you put on your skin.  If you still have your uterus, your doctor may ask you to take a hormone. Talk to your doctor about the right medicine for you.  Estrogen cream.  This puts estrogen only at the part of your body where you apply it. The cream is put into the vagina or put on the vulvar skin. For some women, estrogen cream works faster than pills or the patch. CAN ALL WOMEN WITH THIS PROBLEM USE ESTROGEN? No. Women with certain types of cancer, liver problems, or problems with blood clots should not take estrogen. Your doctor can help you decide the best treatment for  your symptoms. Document Released: 03/14/2008 Document Revised: 12/19/2011 Document Reviewed: 03/14/2008 [facility] Patient Information 2014 [place], [place].

## 2013-04-24 ENCOUNTER — Telehealth: Payer: Self-pay | Admitting: Family Medicine

## 2013-04-24 NOTE — Telephone Encounter (Signed)
#  120 no refills on 03/29/13 04/18/13 last seen

## 2013-04-24 NOTE — Telephone Encounter (Signed)
Pt needs new rx oxycodone 15 mg. Pt will like to pick up this friday

## 2013-04-25 NOTE — Telephone Encounter (Signed)
May refill tomorrow (Friday)

## 2013-04-25 NOTE — Telephone Encounter (Signed)
Pt would like samples of aciphex 20 mg

## 2013-04-26 MED ORDER — OXYCODONE HCL 15 MG PO TABS: 15.0000 mg | ORAL_TABLET | Freq: Four times a day (QID) | ORAL | Status: DC | PRN

## 2013-04-26 NOTE — Telephone Encounter (Signed)
Pt came and picked up rx

## 2013-05-21 ENCOUNTER — Telehealth: Payer: Self-pay | Admitting: Family Medicine

## 2013-05-21 NOTE — Telephone Encounter (Signed)
Pt states she has spoken w/ md about quit smoking and would like to know if he would write a RX for med for her to quit. Pharm: [facility] Would request oxyCODONE (ROXICODONE) 15 MG immediate release tablet for Friday pickup.(30 days up Sat) If you could put the smoking script in w/ Oxycodone, that would be great.

## 2013-05-21 NOTE — Telephone Encounter (Signed)
Pt was last seen on 04/18/13  Last refill #120 no refills 04/26/13

## 2013-05-22 NOTE — Telephone Encounter (Signed)
Pain medication not due until 05-26-13. We can call in rx for Chantix: starter pack take as directed for one month, then Chantix maintenance pack for 2 months.

## 2013-05-23 ENCOUNTER — Ambulatory Visit (INDEPENDENT_AMBULATORY_CARE_PROVIDER_SITE_OTHER): Payer: No Typology Code available for payment source | Admitting: Family Medicine

## 2013-05-23 ENCOUNTER — Encounter: Payer: Self-pay | Admitting: Family Medicine

## 2013-05-23 VITALS — BP 128/80 | HR 84 | Temp 97.7°F | Wt 161.0 lb

## 2013-05-23 DIAGNOSIS — G8929 Other chronic pain: Secondary | ICD-10-CM

## 2013-05-23 DIAGNOSIS — M545 Low back pain, unspecified: Secondary | ICD-10-CM

## 2013-05-23 DIAGNOSIS — F17200 Nicotine dependence, unspecified, uncomplicated: Secondary | ICD-10-CM

## 2013-05-23 MED ORDER — VARENICLINE TARTRATE 1 MG PO TABS: 1.0000 mg | ORAL_TABLET | Freq: Two times a day (BID) | ORAL | Status: DC

## 2013-05-23 MED ORDER — OXYCODONE HCL 15 MG PO TABS: 15.0000 mg | ORAL_TABLET | Freq: Four times a day (QID) | ORAL | Status: DC | PRN

## 2013-05-23 MED ORDER — VARENICLINE TARTRATE 0.5 MG X 11 & 1 MG X 42 PO MISC: ORAL | Status: DC

## 2013-05-23 NOTE — Telephone Encounter (Signed)
rx given to patient during visit

## 2013-05-23 NOTE — Patient Instructions (Addendum)
Smoking Cessation Quitting smoking is important to your health and has many advantages. However, it is not always easy to quit since nicotine is a very addictive drug. Often times, people try 3 times or more before being able to quit. This document explains the best ways for you to prepare to quit smoking. Quitting takes hard work and a lot of effort, but you can do it. ADVANTAGES OF QUITTING SMOKING  You will live longer, feel better, and live better.  Your body will feel the impact of quitting smoking almost immediately.  Within 20 minutes, blood pressure decreases. Your pulse returns to its normal level.  After 8 hours, carbon monoxide levels in the blood return to normal. Your oxygen level increases.  After 24 hours, the chance of having a heart attack starts to decrease. Your breath, hair, and body stop smelling like smoke.  After 48 hours, damaged nerve endings begin to recover. Your sense of taste and smell improve.  After 72 hours, the body is virtually free of nicotine. Your bronchial tubes relax and breathing becomes easier.  After 2 to 12 weeks, lungs can hold more air. Exercise becomes easier and circulation improves.  The risk of having a heart attack, stroke, cancer, or lung disease is greatly reduced.  After 1 year, the risk of coronary heart disease is cut in half.  After 5 years, the risk of stroke falls to the same as a nonsmoker.  After 10 years, the risk of lung cancer is cut in half and the risk of other cancers decreases significantly.  After 15 years, the risk of coronary heart disease drops, usually to the level of a nonsmoker.  If you are pregnant, quitting smoking will improve your chances of having a healthy baby.  The people you live with, especially any children, will be healthier.  You will have extra money to spend on things other than cigarettes. QUESTIONS TO THINK ABOUT BEFORE ATTEMPTING TO QUIT You may want to talk about your answers with your  caregiver.  Why do you want to quit?  If you tried to quit in the past, what helped and what did not?  What will be the most difficult situations for you after you quit? How will you plan to handle them?  Who can help you through the tough times? Your family? Friends? A caregiver?  What pleasures do you get from smoking? What ways can you still get pleasure if you quit? Here are some questions to ask your caregiver:  How can you help me to be successful at quitting?  What medicine do you think would be best for me and how should I take it?  What should I do if I need more help?  What is smoking withdrawal like? How can I get information on withdrawal? GET READY  Set a quit date.  Change your environment by getting rid of all cigarettes, ashtrays, matches, and lighters in your home, car, or work. Do not let people smoke in your home.  Review your past attempts to quit. Think about what worked and what did not. GET SUPPORT AND ENCOURAGEMENT You have a better chance of being successful if you have help. You can get support in many ways.  Tell your family, friends, and co-workers that you are going to quit and need their support. Ask them not to smoke around you.  Get individual, group, or telephone counseling and support. Programs are available at local hospitals and health centers. Call your local health department for   information about programs in your area.  Spiritual beliefs and practices may help some smokers quit.  Download a "quit meter" on your computer to keep track of quit statistics, such as how long you have gone without smoking, cigarettes not smoked, and money saved.  Get a self-help book about quitting smoking and staying off of tobacco. LEARN NEW SKILLS AND BEHAVIORS  Distract yourself from urges to smoke. Talk to someone, go for a walk, or occupy your time with a task.  Change your normal routine. Take a different route to work. Drink tea instead of coffee.  Eat breakfast in a different place.  Reduce your stress. Take a hot bath, exercise, or read a book.  Plan something enjoyable to do every day. Reward yourself for not smoking.  Explore interactive web-based programs that specialize in helping you quit. GET MEDICINE AND USE IT CORRECTLY Medicines can help you stop smoking and decrease the urge to smoke. Combining medicine with the above behavioral methods and support can greatly increase your chances of successfully quitting smoking.  Nicotine replacement therapy helps deliver nicotine to your body without the negative effects and risks of smoking. Nicotine replacement therapy includes nicotine gum, lozenges, inhalers, nasal sprays, and skin patches. Some may be available over-the-counter and others require a prescription.  Antidepressant medicine helps people abstain from smoking, but how this works is unknown. This medicine is available by prescription.  Nicotinic receptor partial agonist medicine simulates the effect of nicotine in your brain. This medicine is available by prescription. Ask your caregiver for advice about which medicines to use and how to use them based on your health history. Your caregiver will tell you what side effects to look out for if you choose to be on a medicine or therapy. Carefully read the information on the package. Do not use any other product containing nicotine while using a nicotine replacement product.  RELAPSE OR DIFFICULT SITUATIONS Most relapses occur within the first 3 months after quitting. Do not be discouraged if you start smoking again. Remember, most people try several times before finally quitting. You may have symptoms of withdrawal because your body is used to nicotine. You may crave cigarettes, be irritable, feel very hungry, cough often, get headaches, or have difficulty concentrating. The withdrawal symptoms are only temporary. They are strongest when you first quit, but they will go away within  10 14 days. To reduce the chances of relapse, try to:  Avoid drinking alcohol. Drinking lowers your chances of successfully quitting.  Reduce the amount of caffeine you consume. Once you quit smoking, the amount of caffeine in your body increases and can give you symptoms, such as a rapid heartbeat, sweating, and anxiety.  Avoid smokers because they can make you want to smoke.  Do not let weight gain distract you. Many smokers will gain weight when they quit, usually less than 10 pounds. Eat a healthy diet and stay active. You can always lose the weight gained after you quit.  Find ways to improve your mood other than smoking. FOR MORE INFORMATION  www.smokefree.gov  Document Released: 09/20/2001 Document Revised: 03/27/2012 Document Reviewed: 01/05/2012 [employer] Patient Information 2014 [employer].  

## 2013-05-23 NOTE — Progress Notes (Signed)
  Subjective:    Patient ID: [name], female    DOB: 08-09-1961, 52 y.o.   MRN: [id number]  HPI Patient has some chronic back pains. Most recent MRI scan in May revealed some lumbar stenosis but if anything worse involvement of left side than right. Her symptoms have consistently been right lower extremity. She has frequent sharp pains that radiate from the back down to the foot. Poor pain control at times even with 15 mg oxycodone every 6 hours. She is requesting refills as she is going out of town. Denies any numbness or weakness. No urine or stool incontinence.  She is reluctant to see orthopedic specialist at this time.  She smokes about one pack cigarettes per day. She is here to discuss smoking cessation. She is specifically interested in trying Chantix. She denies trying this previously.  Past Medical History  Diagnosis Date  . PANIC DISORDER 04/18/2007  . TOBACCO ABUSE 07/17/2009  . CARPAL TUNNEL SYNDROME, BILATERAL 01/16/2008  . ASTHMA UNSPECIFIED WITH EXACERBATION 07/21/2010  . COPD 05/08/2008  . GERD 04/18/2007  . HELICOBACTER PYLORI GASTRITIS, HX OF 04/18/2007   Past Surgical History  Procedure Laterality Date  . Appendectomy    . Oophorectomy      reports that she has been smoking Cigarettes.  She has a 50 pack-year smoking history. She does not have any smokeless tobacco history on file. Her alcohol and drug histories are not on file. family history includes Cancer in her father; Diabetes in her mother. Allergies  Allergen Reactions  . Aspirin     REACTION: had h. pylori due to too many goody powders. told not to take any more asa       Review of Systems  Constitutional: Negative for fatigue.  Eyes: Negative for visual disturbance.  Respiratory: Negative for cough, chest tightness, shortness of breath and wheezing.   Cardiovascular: Negative for chest pain, palpitations and leg swelling.  Musculoskeletal: Positive for back pain.  Neurological: Negative for  dizziness, seizures, syncope, weakness, light-headedness and headaches.       Objective:   Physical Exam  Constitutional: She appears well-developed and well-nourished.  Cardiovascular: Normal rate and regular rhythm.   Pulmonary/Chest: Effort normal and breath sounds normal. No respiratory distress. She has no wheezes. She has no rales.  Musculoskeletal:  Straight leg raises are negative. Right lower extremity reveals good distal pulses. No edema.  Neurological:  No focal strength deficits. Deep tender reflexes are symmetric knee and ankle bilaterally          Assessment & Plan  Chronic low back pain. We discussed options. She had recent MRI which does not show any clear surgical type problem. We discussed possible referral to orthopedist to see if injection therapy may be helpful. She is not interested at this time. We refill her oxycodone as she is going out of town but explained that she would not be due for refill next month until August 18  Tobacco abuse. Discussed smoking cessation. Prescription for Chantix given with caution for possible side effects.  We explained how this medication is use. We suggested quit date within one week of starting this. Handout given

## 2013-05-23 NOTE — Telephone Encounter (Signed)
Pt would like to p/u script on Friday since 8/17 is on Sat. Pt would also like to pick up the script for Chantix so she can take both to the pharm.

## 2013-06-20 ENCOUNTER — Telehealth: Payer: Self-pay | Admitting: Family Medicine

## 2013-06-20 NOTE — Telephone Encounter (Signed)
May refill tomorrow.

## 2013-06-20 NOTE — Telephone Encounter (Signed)
Last visit 05/23/13 Last refill 05/23/13 #120 0 refill

## 2013-06-20 NOTE — Telephone Encounter (Signed)
Pt needs new rx oxycodone °

## 2013-06-21 MED ORDER — OXYCODONE HCL 15 MG PO TABS: 15.0000 mg | ORAL_TABLET | Freq: Four times a day (QID) | ORAL | Status: DC | PRN

## 2013-06-21 NOTE — Telephone Encounter (Signed)
Pt aware that rx is ready for pick up  

## 2013-06-24 ENCOUNTER — Other Ambulatory Visit: Payer: Self-pay | Admitting: Family Medicine

## 2013-06-25 NOTE — Telephone Encounter (Signed)
This is not due until end of month (25th).  If she continues to call early for refills, we will have to discuss tapering off this medication.

## 2013-06-25 NOTE — Telephone Encounter (Signed)
Last refill 04/03/13 #75 2 refills

## 2013-06-27 NOTE — Telephone Encounter (Signed)
Pt called and stated that tomorrow was the day that her refill was due, I explained to her that it was not due until 9/25. She is aware.

## 2013-07-17 ENCOUNTER — Telehealth: Payer: Self-pay | Admitting: Family Medicine

## 2013-07-17 NOTE — Telephone Encounter (Signed)
OK to refill Friday.

## 2013-07-17 NOTE — Telephone Encounter (Signed)
Last refill 06/21/13 #120 0 refill Last visit 05/23/13

## 2013-07-17 NOTE — Telephone Encounter (Signed)
Pt is calling to request a refill of her oxyCODONE (ROXICODONE) 15 MG immediate release tablet. She states that she is due to pick it up this Friday. Please assist.

## 2013-07-18 ENCOUNTER — Other Ambulatory Visit: Payer: Self-pay

## 2013-07-18 MED ORDER — OXYCODONE HCL 15 MG PO TABS: 15.0000 mg | ORAL_TABLET | Freq: Four times a day (QID) | ORAL | Status: DC | PRN

## 2013-07-18 NOTE — Telephone Encounter (Signed)
Rx ready for pick up and pt is aware

## 2013-07-19 ENCOUNTER — Ambulatory Visit (INDEPENDENT_AMBULATORY_CARE_PROVIDER_SITE_OTHER): Payer: No Typology Code available for payment source | Admitting: Family Medicine

## 2013-07-19 ENCOUNTER — Encounter: Payer: Self-pay | Admitting: Family Medicine

## 2013-07-19 VITALS — BP 120/82 | HR 85 | Temp 98.1°F | Wt 162.0 lb

## 2013-07-19 DIAGNOSIS — R062 Wheezing: Secondary | ICD-10-CM

## 2013-07-19 DIAGNOSIS — R05 Cough: Secondary | ICD-10-CM

## 2013-07-19 DIAGNOSIS — R059 Cough, unspecified: Secondary | ICD-10-CM

## 2013-07-19 MED ORDER — PREDNISONE 20 MG PO TABS: ORAL_TABLET | ORAL | Status: DC

## 2013-07-19 MED ORDER — LEVOFLOXACIN 500 MG PO TABS: 500.0000 mg | ORAL_TABLET | Freq: Every day | ORAL | Status: DC

## 2013-07-19 NOTE — Progress Notes (Signed)
  Subjective:    Patient ID: [name], female    DOB: [birth year], 52 y.o.   MRN: [id number]  HPI Acute visit Patient seen with 3 week history of dry cough. Ongoing nicotine use and trying to scale back. No productive cough. She has some mild increased dyspnea. Intermittent mild wheezing. Using albuterol as needed with mild relief. Denies any fevers or chills. No hemoptysis. No pleuritic pain. She has Spiriva but does not take consistently  Also complains of some bilateral maxillary facial pressure and intermittent headaches. Occasional yellow tinge nasal discharge. Denies any GERD symptoms.  Past Medical History  Diagnosis Date  . PANIC DISORDER 04/18/2007  . TOBACCO ABUSE 07/17/2009  . CARPAL TUNNEL SYNDROME, BILATERAL 01/16/2008  . ASTHMA UNSPECIFIED WITH EXACERBATION 07/21/2010  . COPD 05/08/2008  . GERD 04/18/2007  . HELICOBACTER PYLORI GASTRITIS, HX OF 04/18/2007   Past Surgical History  Procedure Laterality Date  . Appendectomy    . Oophorectomy      reports that she has been smoking Cigarettes.  She has a 50 pack-year smoking history. She does not have any smokeless tobacco history on file. Her alcohol and drug histories are not on file. family history includes Cancer in her father; Diabetes in her mother. Allergies  Allergen Reactions  . Aspirin     REACTION: had h. pylori due to too many goody powders. told not to take any more asa      Review of Systems  Constitutional: Positive for fatigue.  HENT: Positive for sinus pressure. Negative for postnasal drip.   Respiratory: Positive for cough, shortness of breath and wheezing. Negative for chest tightness.   Cardiovascular: Negative for chest pain, palpitations and leg swelling.  Neurological: Positive for headaches.       Objective:   Physical Exam  Constitutional: She appears well-developed and well-nourished.  HENT:  Right Ear: External ear normal.  Left Ear: External ear normal.  Mouth/Throat: Oropharynx is clear  and moist.  Neck: Neck supple. No thyromegaly present.  Cardiovascular: Normal rate and regular rhythm.   Pulmonary/Chest: Effort normal. No respiratory distress. She has wheezes. She has no rales.  She does have some mild expiratory wheezes  Musculoskeletal: She exhibits no edema.          Assessment & Plan:  Cough with reactive airway component. Possible acute sinusitis. We again emphasized importance of stopping smoking. Take Spiriva regularly and not when necessary. Levaquin 500 mg once daily for 7 days. Prednisone for the next 7 days.

## 2013-07-19 NOTE — Patient Instructions (Signed)

## 2013-08-02 ENCOUNTER — Other Ambulatory Visit: Payer: Self-pay | Admitting: Family Medicine

## 2013-08-13 ENCOUNTER — Telehealth: Payer: Self-pay | Admitting: Family Medicine

## 2013-08-13 NOTE — Telephone Encounter (Signed)
Pt needs new rx oxycodone °

## 2013-08-13 NOTE — Telephone Encounter (Signed)
This is too early.  If she is repeatedly needing/requesting refills one week early I suggest we look at chronic pain management clinic.

## 2013-08-13 NOTE — Telephone Encounter (Signed)
Last visit 07/19/2013 Last refill 07/19/2013 #120 0 refill

## 2013-08-15 NOTE — Telephone Encounter (Signed)
Pt would like to pick up rx this afternoon

## 2013-08-15 NOTE — Telephone Encounter (Signed)
Patient is informed that she can come and pick up RX on Monday 08-19-13

## 2013-08-19 MED ORDER — OXYCODONE HCL 15 MG PO TABS: 15.0000 mg | ORAL_TABLET | Freq: Four times a day (QID) | ORAL | Status: DC | PRN

## 2013-08-19 NOTE — Addendum Note (Signed)
Addended by: Thomasena Edis on: 08/19/2013 08:44 AM   Modules accepted: Orders

## 2013-09-13 ENCOUNTER — Telehealth: Payer: Self-pay | Admitting: Family Medicine

## 2013-09-13 NOTE — Telephone Encounter (Signed)
Requesting refill of oxyCODONE (ROXICODONE) 15 MG immediate release tablet to be picked up on Tuesday.

## 2013-09-13 NOTE — Telephone Encounter (Signed)
Last visit 07/19/13 Last refill 08/19/13 #120 0 refill

## 2013-09-15 NOTE — Telephone Encounter (Signed)
Refill on Tuesday.

## 2013-09-17 MED ORDER — OXYCODONE HCL 15 MG PO TABS: 15.0000 mg | ORAL_TABLET | Freq: Four times a day (QID) | ORAL | Status: DC | PRN

## 2013-09-17 NOTE — Telephone Encounter (Signed)
Pt informed that RX is ready for pick up 

## 2013-09-19 ENCOUNTER — Ambulatory Visit (INDEPENDENT_AMBULATORY_CARE_PROVIDER_SITE_OTHER): Payer: No Typology Code available for payment source | Admitting: Family Medicine

## 2013-09-19 ENCOUNTER — Encounter: Payer: Self-pay | Admitting: Family Medicine

## 2013-09-19 VITALS — BP 128/82 | HR 103 | Temp 97.9°F | Wt 166.0 lb

## 2013-09-19 DIAGNOSIS — M79651 Pain in right thigh: Secondary | ICD-10-CM

## 2013-09-19 DIAGNOSIS — M79609 Pain in unspecified limb: Secondary | ICD-10-CM

## 2013-09-19 NOTE — Patient Instructions (Signed)
We will call you regarding arterial Doppler studies Try to quit smoking. Take baby aspirin one daily if able to tolerate

## 2013-09-19 NOTE — Progress Notes (Signed)
Pre visit review using our clinic review tool, if applicable. No additional management support is needed unless otherwise documented below in the visit note. 

## 2013-09-19 NOTE — Progress Notes (Signed)
   Subjective:    Patient ID: [name], female    DOB: 10-Jun-1961, 52 y.o.   MRN: [id number]  HPI Patient seen with right lower extremity pain which is very intermittent. Onset several months ago. She is describing episodes which tend to occur at rest of pain between her right anterior upper thigh and lower thigh. She describes a heavy achy sensation which is severe 10 out of 10 pain. She is not describing any pain with ambulation or claudication-type symptoms.  Duration is several hours at times. No alleviating or exacerbating factors.  She does not get good relief even on oxycodone. She does not have any history of PAD but does have risk factors including ongoing nicotine use as well as history of dyslipidemia. She's never had any associated pain with walking. She does have history of chronic back pain. She's had previous MRI lumbar spine which did not show recent right-sided findings. No history of shingles or any rash. Patient is very concerned about PAD though again she's not describing any classic PAD-type symptoms  Past Medical History  Diagnosis Date  . PANIC DISORDER 04/18/2007  . TOBACCO ABUSE 07/17/2009  . CARPAL TUNNEL SYNDROME, BILATERAL 01/16/2008  . ASTHMA UNSPECIFIED WITH EXACERBATION 07/21/2010  . COPD 05/08/2008  . GERD 04/18/2007  . HELICOBACTER PYLORI GASTRITIS, HX OF 04/18/2007   Past Surgical History  Procedure Laterality Date  . Appendectomy    . Oophorectomy      reports that she has been smoking Cigarettes.  She has a 50 pack-year smoking history. She does not have any smokeless tobacco history on file. Her alcohol and drug histories are not on file. family history includes Cancer in her father; Diabetes in her mother. Allergies  Allergen Reactions  . Aspirin     REACTION: had h. pylori due to too many goody powders. told not to take any more asa      Review of Systems  Constitutional: Negative for appetite change and unexpected weight change.  Respiratory:  Negative for shortness of breath.   Cardiovascular: Negative for chest pain, palpitations and leg swelling.  Gastrointestinal: Negative for abdominal pain.  Genitourinary: Negative for dysuria.  Musculoskeletal: Positive for back pain.  Neurological: Negative for weakness and numbness.  Hematological: Negative for adenopathy.       Objective:   Physical Exam  Constitutional: She appears well-developed and well-nourished.  Cardiovascular: Normal rate.   Pulmonary/Chest: Effort normal and breath sounds normal. No respiratory distress. She has no wheezes. She has no rales.  Musculoskeletal:  Patient has no evidence for any edema involving lower extremities. Both feet are warm to touch. She has good capillary refill bilaterally. Minimally diminished right dorsalis pedis compared to left. 1+ posterior tibials bilaterally. She has 2+ femoral pulses bilaterally.  Full range of motion right hip  Neurological:  No focal weakness lower extremities. She has 2+ knee reflexes 1+ ankle bilaterally.          Assessment & Plan:  Right thigh pain. She's describing intermittent symptoms at rest. Low suspicion for PAD but patient is very concerned about possibility. She does have risk factors above. We'll set up arterial Dopplers though suspect she will not have significantly diminished ABI. Suspect this may be more of a neuropathic type pain. She has not tolerated gabapentin or Lyrica in the past.  Consider referral for nerve conduction studies if ABI's are normal

## 2013-09-26 ENCOUNTER — Ambulatory Visit (HOSPITAL_COMMUNITY): Payer: No Typology Code available for payment source | Attending: Family Medicine

## 2013-09-26 DIAGNOSIS — I739 Peripheral vascular disease, unspecified: Secondary | ICD-10-CM

## 2013-09-26 DIAGNOSIS — M79609 Pain in unspecified limb: Secondary | ICD-10-CM | POA: Insufficient documentation

## 2013-09-26 DIAGNOSIS — M79651 Pain in right thigh: Secondary | ICD-10-CM

## 2013-09-27 ENCOUNTER — Other Ambulatory Visit: Payer: Self-pay | Admitting: Family Medicine

## 2013-09-27 DIAGNOSIS — Z1231 Encounter for screening mammogram for malignant neoplasm of breast: Secondary | ICD-10-CM

## 2013-10-02 ENCOUNTER — Other Ambulatory Visit: Payer: Self-pay | Admitting: Family Medicine

## 2013-10-02 NOTE — Telephone Encounter (Signed)
Last visit 09/19/13 Last refill 06/24/13 #75 2 refill

## 2013-10-02 NOTE — Telephone Encounter (Signed)
Refill for 3 months. 

## 2013-10-14 ENCOUNTER — Telehealth: Payer: Self-pay | Admitting: Family Medicine

## 2013-10-14 NOTE — Telephone Encounter (Signed)
Not due until Jan 8th.

## 2013-10-14 NOTE — Telephone Encounter (Signed)
Last refill 09/17/13 #120 0 Last visit 09/19/13

## 2013-10-14 NOTE — Telephone Encounter (Signed)
Requesting refill of oxyCODONE (ROXICODONE) 15 MG immediate release tablet.

## 2013-10-15 ENCOUNTER — Encounter: Payer: Self-pay | Admitting: Family Medicine

## 2013-10-15 NOTE — Telephone Encounter (Signed)
Pt is aware rx is due on 10-17-13

## 2013-10-17 ENCOUNTER — Ambulatory Visit (HOSPITAL_COMMUNITY)
Admission: RE | Admit: 2013-10-17 | Discharge: 2013-10-17 | Disposition: A | Discharge status: Home / Self Care | Payer: No Typology Code available for payment source | Source: Ambulatory Visit | Attending: Family Medicine | Admitting: Family Medicine

## 2013-10-17 DIAGNOSIS — Z1231 Encounter for screening mammogram for malignant neoplasm of breast: Secondary | ICD-10-CM | POA: Insufficient documentation

## 2013-10-17 MED ORDER — OXYCODONE HCL 15 MG PO TABS: 15.0000 mg | ORAL_TABLET | Freq: Four times a day (QID) | ORAL | Status: DC | PRN

## 2013-10-17 NOTE — Telephone Encounter (Signed)
Pt informed that RX will be ready for pick up today

## 2013-11-13 ENCOUNTER — Telehealth: Payer: Self-pay | Admitting: Family Medicine

## 2013-11-13 NOTE — Telephone Encounter (Signed)
May refill 11-16-13.  Try OTC Delsym cough syrup.

## 2013-11-13 NOTE — Telephone Encounter (Signed)
Last visit 09/19/13 Last refill 10/17/13 #120 0 refill  What can pt get OTC for a cough

## 2013-11-13 NOTE — Telephone Encounter (Signed)
Pt is requesting rx for oxycodone, please call to advise when to p/u. Pt wants to be advised of what over the counter cough meds she can get.

## 2013-11-15 ENCOUNTER — Other Ambulatory Visit: Payer: Self-pay

## 2013-11-15 MED ORDER — OXYCODONE HCL 15 MG PO TABS: 15.0000 mg | ORAL_TABLET | Freq: Four times a day (QID) | ORAL | Status: DC | PRN

## 2013-11-15 NOTE — Telephone Encounter (Signed)
Pt aware that RX is ready for pick up  

## 2013-11-19 ENCOUNTER — Encounter: Payer: Self-pay | Admitting: Family Medicine

## 2013-11-19 ENCOUNTER — Ambulatory Visit (INDEPENDENT_AMBULATORY_CARE_PROVIDER_SITE_OTHER): Payer: No Typology Code available for payment source | Admitting: Family Medicine

## 2013-11-19 VITALS — BP 126/78 | HR 88 | Wt 165.0 lb

## 2013-11-19 DIAGNOSIS — Z7189 Other specified counseling: Secondary | ICD-10-CM

## 2013-11-19 DIAGNOSIS — Z780 Asymptomatic menopausal state: Secondary | ICD-10-CM

## 2013-11-19 DIAGNOSIS — Z1211 Encounter for screening for malignant neoplasm of colon: Secondary | ICD-10-CM

## 2013-11-19 DIAGNOSIS — Z716 Tobacco abuse counseling: Secondary | ICD-10-CM

## 2013-11-19 NOTE — Patient Instructions (Signed)
Smoking Cessation Quitting smoking is important to your health and has many advantages. However, it is not always easy to quit since nicotine is a very addictive drug. Often times, people try 3 times or more before being able to quit. This document explains the best ways for you to prepare to quit smoking. Quitting takes hard work and a lot of effort, but you can do it. ADVANTAGES OF QUITTING SMOKING  You will live longer, feel better, and live better.  Your body will feel the impact of quitting smoking almost immediately.  Within 20 minutes, blood pressure decreases. Your pulse returns to its normal level.  After 8 hours, carbon monoxide levels in the blood return to normal. Your oxygen level increases.  After 24 hours, the chance of having a heart attack starts to decrease. Your breath, hair, and body stop smelling like smoke.  After 48 hours, damaged nerve endings begin to recover. Your sense of taste and smell improve.  After 72 hours, the body is virtually free of nicotine. Your bronchial tubes relax and breathing becomes easier.  After 2 to 12 weeks, lungs can hold more air. Exercise becomes easier and circulation improves.  The risk of having a heart attack, stroke, cancer, or lung disease is greatly reduced.  After 1 year, the risk of coronary heart disease is cut in half.  After 5 years, the risk of stroke falls to the same as a nonsmoker.  After 10 years, the risk of lung cancer is cut in half and the risk of other cancers decreases significantly.  After 15 years, the risk of coronary heart disease drops, usually to the level of a nonsmoker.  If you are pregnant, quitting smoking will improve your chances of having a healthy baby.  The people you live with, especially any children, will be healthier.  You will have extra money to spend on things other than cigarettes. QUESTIONS TO THINK ABOUT BEFORE ATTEMPTING TO QUIT You may want to talk about your answers with your  caregiver.  Why do you want to quit?  If you tried to quit in the past, what helped and what did not?  What will be the most difficult situations for you after you quit? How will you plan to handle them?  Who can help you through the tough times? Your family? Friends? A caregiver?  What pleasures do you get from smoking? What ways can you still get pleasure if you quit? Here are some questions to ask your caregiver:  How can you help me to be successful at quitting?  What medicine do you think would be best for me and how should I take it?  What should I do if I need more help?  What is smoking withdrawal like? How can I get information on withdrawal? GET READY  Set a quit date.  Change your environment by getting rid of all cigarettes, ashtrays, matches, and lighters in your home, car, or work. Do not let people smoke in your home.  Review your past attempts to quit. Think about what worked and what did not. GET SUPPORT AND ENCOURAGEMENT You have a better chance of being successful if you have help. You can get support in many ways.  Tell your family, friends, and co-workers that you are going to quit and need their support. Ask them not to smoke around you.  Get individual, group, or telephone counseling and support. Programs are available at local hospitals and health centers. Call your local health department for   information about programs in your area.  Spiritual beliefs and practices may help some smokers quit.  Download a "quit meter" on your computer to keep track of quit statistics, such as how long you have gone without smoking, cigarettes not smoked, and money saved.  Get a self-help book about quitting smoking and staying off of tobacco. LEARN NEW SKILLS AND BEHAVIORS  Distract yourself from urges to smoke. Talk to someone, go for a walk, or occupy your time with a task.  Change your normal routine. Take a different route to work. Drink tea instead of coffee.  Eat breakfast in a different place.  Reduce your stress. Take a hot bath, exercise, or read a book.  Plan something enjoyable to do every day. Reward yourself for not smoking.  Explore interactive web-based programs that specialize in helping you quit. GET MEDICINE AND USE IT CORRECTLY Medicines can help you stop smoking and decrease the urge to smoke. Combining medicine with the above behavioral methods and support can greatly increase your chances of successfully quitting smoking.  Nicotine replacement therapy helps deliver nicotine to your body without the negative effects and risks of smoking. Nicotine replacement therapy includes nicotine gum, lozenges, inhalers, nasal sprays, and skin patches. Some may be available over-the-counter and others require a prescription.  Antidepressant medicine helps people abstain from smoking, but how this works is unknown. This medicine is available by prescription.  Nicotinic receptor partial agonist medicine simulates the effect of nicotine in your brain. This medicine is available by prescription. Ask your caregiver for advice about which medicines to use and how to use them based on your health history. Your caregiver will tell you what side effects to look out for if you choose to be on a medicine or therapy. Carefully read the information on the package. Do not use any other product containing nicotine while using a nicotine replacement product.  RELAPSE OR DIFFICULT SITUATIONS Most relapses occur within the first 3 months after quitting. Do not be discouraged if you start smoking again. Remember, most people try several times before finally quitting. You may have symptoms of withdrawal because your body is used to nicotine. You may crave cigarettes, be irritable, feel very hungry, cough often, get headaches, or have difficulty concentrating. The withdrawal symptoms are only temporary. They are strongest when you first quit, but they will go away within  10 14 days. To reduce the chances of relapse, try to:  Avoid drinking alcohol. Drinking lowers your chances of successfully quitting.  Reduce the amount of caffeine you consume. Once you quit smoking, the amount of caffeine in your body increases and can give you symptoms, such as a rapid heartbeat, sweating, and anxiety.  Avoid smokers because they can make you want to smoke.  Do not let weight gain distract you. Many smokers will gain weight when they quit, usually less than 10 pounds. Eat a healthy diet and stay active. You can always lose the weight gained after you quit.  Find ways to improve your mood other than smoking. FOR MORE INFORMATION  www.smokefree.gov  Document Released: 09/20/2001 Document Revised: 03/27/2012 Document Reviewed: 01/05/2012 [employer] Patient Information 2014 [employer].  

## 2013-11-19 NOTE — Progress Notes (Signed)
   Subjective:    Patient ID: [name], female    DOB: Jan 02, 1961, 53 y.o.   MRN: [id number]  HPI Patient is here to discuss the following Requesting screening colonoscopy. She had a maternal uncle with colon cancer. She is asymptomatic in terms of any bowel habit changes. No bloody stools. No recent appetite or weight changes.  She has ongoing nicotine use one pack cigarettes per day. She's had previous intolerance to Wellbutrin and Chantix with anxiety symptoms with both. She currently smokes one pack cigarettes per day. She is considering nicotine replacement such as patches. She has some chronic cough with white sputum early-morning. No increased dyspnea. No hemoptysis.  She has questions regarding estrogen replacement. She does not have any history of DVT but is high risk with her smoking history. She also has high risk of vascular disease in general because of her smoking history and dyslipidemia. She still has her uterus.  Past Medical History  Diagnosis Date  . PANIC DISORDER 04/18/2007  . TOBACCO ABUSE 07/17/2009  . CARPAL TUNNEL SYNDROME, BILATERAL 01/16/2008  . ASTHMA UNSPECIFIED WITH EXACERBATION 07/21/2010  . COPD 05/08/2008  . GERD 04/18/2007  . HELICOBACTER PYLORI GASTRITIS, HX OF 04/18/2007   Past Surgical History  Procedure Laterality Date  . Appendectomy    . Oophorectomy      reports that she has been smoking Cigarettes.  She has a 50 pack-year smoking history. She does not have any smokeless tobacco history on file. Her alcohol and drug histories are not on file. family history includes Cancer in her father; Diabetes in her mother. Allergies  Allergen Reactions  . Aspirin     REACTION: had h. pylori due to too many goody powders. told not to take any more asa      Review of Systems  Constitutional: Negative for fatigue and unexpected weight change.  Eyes: Negative for visual disturbance.  Respiratory: Positive for cough and shortness of breath. Negative for  chest tightness and wheezing.   Cardiovascular: Negative for chest pain, palpitations and leg swelling.  Musculoskeletal: Positive for back pain.  Neurological: Negative for dizziness, seizures, syncope, weakness, light-headedness and headaches.       Objective:   Physical Exam  Constitutional: She appears well-developed and well-nourished.  Neck: Neck supple. No thyromegaly present.  Cardiovascular: Normal rate.   Pulmonary/Chest: Effort normal and breath sounds normal. No respiratory distress. She has no wheezes. She has no rales.  Musculoskeletal: She exhibits no edema.  Lymphadenopathy:    She has no cervical adenopathy.  Neurological: She is alert.  Psychiatric: She has a normal mood and affect. Her behavior is normal.          Assessment & Plan:  #1 Colon cancer screening. We recommend screening colonoscopy and she consents. This will be set up #2 postmenopausal. We had a long discussion regarding pros and cons of hormone replacement. She would have to take combination therapy with estrogen and progesterone with her uterus. We've explained our reluctance with her smoking history with increased risk of DVT, stroke, and breast cancer. She does not have any severe hot flashes currently #3 ongoing nicotine use. Smoking cessation discussed in detail. She will try over-the-counter patches. Previous intolerance to medications as above

## 2013-11-19 NOTE — Progress Notes (Signed)
Pre visit review using our clinic review tool, if applicable. No additional management support is needed unless otherwise documented below in the visit note. 

## 2013-11-20 ENCOUNTER — Telehealth: Payer: Self-pay | Admitting: Family Medicine

## 2013-11-20 NOTE — Telephone Encounter (Signed)
Relevant patient education assigned to patient using Emmi. ° °

## 2013-12-02 ENCOUNTER — Encounter: Payer: Self-pay | Admitting: Gastroenterology

## 2013-12-12 ENCOUNTER — Telehealth: Payer: Self-pay | Admitting: Family Medicine

## 2013-12-12 NOTE — Telephone Encounter (Signed)
Last visit 11/19/13 Last refill 11/15/13 #120 0 refill

## 2013-12-12 NOTE — Telephone Encounter (Signed)
Refill by tomorrow. 

## 2013-12-12 NOTE — Telephone Encounter (Signed)
Pt is needing new rx for  Oxycodone (roxicodone) 15 mg immediate release tablet. Please call when available for pick up.

## 2013-12-13 MED ORDER — OXYCODONE HCL 15 MG PO TABS: 15.0000 mg | ORAL_TABLET | Freq: Four times a day (QID) | ORAL | Status: DC | PRN

## 2013-12-13 NOTE — Telephone Encounter (Signed)
Pt aware that RX is ready for pick up  

## 2013-12-30 ENCOUNTER — Other Ambulatory Visit: Payer: Self-pay | Admitting: Family Medicine

## 2013-12-30 NOTE — Telephone Encounter (Signed)
Last visit 11/19/13 Last refill 10/02/13 #75 2 refills

## 2013-12-30 NOTE — Telephone Encounter (Signed)
Refill with 2 additional refills. 

## 2014-01-08 ENCOUNTER — Ambulatory Visit: Payer: No Typology Code available for payment source | Admitting: Gastroenterology

## 2014-01-08 ENCOUNTER — Telehealth: Payer: Self-pay | Admitting: Gastroenterology

## 2014-01-08 NOTE — Telephone Encounter (Signed)
No charge. 

## 2014-01-09 ENCOUNTER — Telehealth: Payer: Self-pay | Admitting: Family Medicine

## 2014-01-09 MED ORDER — OXYCODONE HCL 15 MG PO TABS: 15.0000 mg | ORAL_TABLET | Freq: Four times a day (QID) | ORAL | Status: DC | PRN

## 2014-01-09 NOTE — Telephone Encounter (Signed)
Pt aware that RX is ready for pick up  

## 2014-01-09 NOTE — Telephone Encounter (Signed)
Refill OK

## 2014-01-09 NOTE — Telephone Encounter (Signed)
Last visit 11/19/13 Last refill 12/13/13 #120 0 refill

## 2014-01-09 NOTE — Telephone Encounter (Signed)
Pt needs new rx oxycodone °

## 2014-01-28 ENCOUNTER — Ambulatory Visit (INDEPENDENT_AMBULATORY_CARE_PROVIDER_SITE_OTHER): Payer: No Typology Code available for payment source | Admitting: Physician Assistant

## 2014-01-28 ENCOUNTER — Encounter: Payer: Self-pay | Admitting: Physician Assistant

## 2014-01-28 VITALS — BP 118/60 | HR 79 | Temp 98.7°F | Resp 16 | Ht 67.0 in | Wt 165.0 lb

## 2014-01-28 DIAGNOSIS — J45901 Unspecified asthma with (acute) exacerbation: Secondary | ICD-10-CM

## 2014-01-28 DIAGNOSIS — J019 Acute sinusitis, unspecified: Secondary | ICD-10-CM

## 2014-01-28 MED ORDER — DOXYCYCLINE HYCLATE 100 MG PO TABS: 100.0000 mg | ORAL_TABLET | Freq: Two times a day (BID) | ORAL | Status: DC

## 2014-01-28 NOTE — Progress Notes (Signed)
Subjective:    Patient ID: [name], female    DOB: 08-12-1961, 53 y.o.   MRN: [id number]  Cough Associated symptoms include headaches and wheezing.  Headache  Associated symptoms include coughing.  Wheezing  Associated symptoms include coughing and headaches.   Patient is a 53 year old Caucasian female with a history of COPD and asthma presenting today for cough, headache, and wheezing. Patient states that her symptoms started Friday with sinus pressure and congestion, Saturday morning she awoke with a sore throat from postnasal drip. She has been experiencing shortness of breath, however she said she always does due to her COPD. States that she has been experiencing symptoms of cough with some mildly green sputum, and this same sputum is also coming from her nose and her postnasal drainage. She had a headache yesterday, which she took [place] powder for, which she says resolved the headache. She states feeling as though she had a fever last night, however she denies chills, nausea, vomiting, and diarrhea.   Review of Systems  Respiratory: Positive for cough and wheezing.   Neurological: Positive for headaches.  As per history of present illness and are otherwise negative.  Past Medical History  Diagnosis Date  . PANIC DISORDER 04/18/2007  . TOBACCO ABUSE 07/17/2009  . CARPAL TUNNEL SYNDROME, BILATERAL 01/16/2008  . ASTHMA UNSPECIFIED WITH EXACERBATION 07/21/2010  . COPD 05/08/2008  . GERD 04/18/2007  . HELICOBACTER PYLORI GASTRITIS, HX OF 04/18/2007   Past Surgical History  Procedure Laterality Date  . Appendectomy    . Oophorectomy      reports that she has been smoking Cigarettes.  She has a 50 pack-year smoking history. She does not have any smokeless tobacco history on file. Her alcohol and drug histories are not on file. family history includes Cancer in her father; Diabetes in her mother. Allergies  Allergen Reactions  . Aspirin     REACTION: had h. pylori due to too many  goody powders. told not to take any more asa       Objective:   Physical Exam  Nursing note and vitals reviewed. Constitutional: She is oriented to person, place, and time. She appears well-developed and well-nourished. No distress.  HENT:  Head: Normocephalic and atraumatic.  Right Ear: External ear normal.  Left Ear: External ear normal.  Nose: Nose normal.  Mouth/Throat: No oropharyngeal exudate.  Oropharynx is mildly erythematous, no exudate present.  Bilateral tympanic membranes are normal in appearance.  Left maxillary sinus exquisitely tender to palpation. Bilateral frontal and right maxillary sinuses nontender to palpation.  Eyes: Conjunctivae and EOM are normal. Pupils are equal, round, and reactive to light.  Neck: Normal range of motion. Neck supple. No tracheal deviation present. No thyromegaly present.  Cardiovascular: Normal rate, regular rhythm, normal heart sounds and intact distal pulses.  Exam reveals no gallop and no friction rub.   No murmur heard. Pulmonary/Chest: Effort normal. No stridor. No respiratory distress. She has wheezes. She has no rales. She exhibits no tenderness.  Expiratory wheezes present on exam the do not clear with cough. No respiratory distress present. No rhonchi or crackles present on exam.  Abdominal: Soft. Bowel sounds are normal. There is no tenderness.  Musculoskeletal: Normal range of motion. She exhibits no edema.  Lymphadenopathy:    She has no cervical adenopathy.  Neurological: She is alert and oriented to person, place, and time. She has normal reflexes. No cranial nerve deficit. Coordination normal.  Skin: Skin is warm and dry. No rash noted.  She is not diaphoretic. No erythema. No pallor.  Psychiatric: She has a normal mood and affect. Her behavior is normal. Judgment and thought content normal.   Filed Vitals:   01/28/14 1009  BP: 118/60  Pulse: 79  Temp: 98.7 F (37.1 C)  Resp: 16   Lab Results  Component Value Date     WBC 9.7 06/16/2011   HGB 13.6 06/16/2011   HCT 39.4 06/16/2011   PLT 226.0 06/16/2011   GLUCOSE 96 02/21/2013   CHOL 333* 02/21/2013   TRIG 1002.0 Verified by manual dilution.* 02/21/2013   HDL 29.30* 02/21/2013   LDLDIRECT 71.5 02/21/2013   ALT 16 02/21/2013   AST 18 02/21/2013   NA 136 02/21/2013   K 4.0 02/21/2013   CL 101 02/21/2013   CREATININE 0.7 02/21/2013   BUN 10 02/21/2013   CO2 28 02/21/2013   TSH 0.78 02/21/2013   INR 0.8 06/22/2009   HGBA1C 5.8 02/21/2013      Assessment & Plan:  [name] was seen today for cough, headache and wheezing.  Diagnoses and associated orders for this visit:  Acute sinusitis - doxycycline (VIBRA-TABS) 100 MG tablet; Take 1 tablet (100 mg total) by mouth 2 (two) times daily.  ASTHMA UNSPECIFIED WITH EXACERBATION    Patient Instructions  Doxycycline 100 mg by mouth twice a day for 10 days.  Continue Qvar for wheezing symptoms from asthma exacerbation.  Plain OTC Mucinex (NOT D) for thick secretions ;force NON dairy fluids .    OTC Flonase OR Nasacort AQ 1 spray in each nostril twice a day as needed. Use the "crossover" technique into opposite nostril spraying toward opposite ear @ 45 degree angle, not straight up into nostril.   Plain OTC Allegra (NOT D )  160 daily , OTC Loratidine 10 mg , OR OTC Zyrtec 10 mg @ bedtime  as needed for itchy eyes & sneezing.  Followup if symptoms worsen or do not improve despite treatment.

## 2014-01-28 NOTE — Patient Instructions (Signed)
Doxycycline 100 mg by mouth twice a day for 10 days.  Continue Qvar for wheezing symptoms from asthma exacerbation.  Plain OTC Mucinex (NOT D) for thick secretions ;force NON dairy fluids .    OTC Flonase OR Nasacort AQ 1 spray in each nostril twice a day as needed. Use the "crossover" technique into opposite nostril spraying toward opposite ear @ 45 degree angle, not straight up into nostril.   Plain OTC Allegra (NOT D )  160 daily , OTC Loratidine 10 mg , OR OTC Zyrtec 10 mg @ bedtime  as needed for itchy eyes & sneezing.  Followup if symptoms worsen or do not improve despite treatment.  Sinusitis Sinusitis is redness, soreness, and puffiness (inflammation) of the air pockets in the bones of your face (sinuses). The redness, soreness, and puffiness can cause air and mucus to get trapped in your sinuses. This can allow germs to grow and cause an infection.  HOME CARE   Drink enough fluids to keep your pee (urine) clear or pale yellow.  Use a humidifier in your home.  Run a hot shower to create steam in the bathroom. Sit in the bathroom with the door closed. Breathe in the steam 3 4 times a day.  Put a warm, moist washcloth on your face 3 4 times a day, or as told by your doctor.  Use salt water sprays (saline sprays) to wet the thick fluid in your nose. This can help the sinuses drain.  Only take medicine as told by your doctor. GET HELP RIGHT AWAY IF:   Your pain gets worse.  You have very bad headaches.  You are sick to your stomach (nauseous).  You throw up (vomit).  You are very sleepy (drowsy) all the time.  Your face is puffy (swollen).  Your vision changes.  You have a stiff neck.  You have trouble breathing. MAKE SURE YOU:   Understand these instructions.  Will watch your condition.  Will get help right away if you are not doing well or get worse. Document Released: 03/14/2008 Document Revised: 06/20/2012 Document Reviewed: 05/01/2012 [facility] Patient  Information 2014 [employer].

## 2014-01-28 NOTE — Progress Notes (Signed)
Pre visit review using our clinic review tool, if applicable. No additional management support is needed unless otherwise documented below in the visit note. 

## 2014-02-10 ENCOUNTER — Encounter: Payer: Self-pay | Admitting: Family Medicine

## 2014-02-10 ENCOUNTER — Ambulatory Visit (INDEPENDENT_AMBULATORY_CARE_PROVIDER_SITE_OTHER): Payer: No Typology Code available for payment source | Admitting: Family Medicine

## 2014-02-10 ENCOUNTER — Telehealth: Payer: Self-pay | Admitting: Family Medicine

## 2014-02-10 VITALS — BP 128/82 | HR 79 | Temp 98.0°F | Wt 158.0 lb

## 2014-02-10 DIAGNOSIS — IMO0001 Reserved for inherently not codable concepts without codable children: Secondary | ICD-10-CM

## 2014-02-10 DIAGNOSIS — R5383 Other fatigue: Secondary | ICD-10-CM

## 2014-02-10 DIAGNOSIS — R634 Abnormal weight loss: Secondary | ICD-10-CM

## 2014-02-10 DIAGNOSIS — R109 Unspecified abdominal pain: Secondary | ICD-10-CM

## 2014-02-10 DIAGNOSIS — R5381 Other malaise: Secondary | ICD-10-CM

## 2014-02-10 DIAGNOSIS — E785 Hyperlipidemia, unspecified: Secondary | ICD-10-CM

## 2014-02-10 LAB — CBC WITH DIFFERENTIAL/PLATELET
BASOS ABS: 0 10*3/uL (ref 0.0–0.1)
BASOS PCT: 0.3 % (ref 0.0–3.0)
EOS ABS: 0.1 10*3/uL (ref 0.0–0.7)
Eosinophils Relative: 0.9 % (ref 0.0–5.0)
HCT: 43.6 % (ref 36.0–46.0)
HEMOGLOBIN: 15.1 g/dL — AB (ref 12.0–15.0)
Lymphocytes Relative: 27.2 % (ref 12.0–46.0)
Lymphs Abs: 2.9 10*3/uL (ref 0.7–4.0)
MCHC: 34.7 g/dL (ref 30.0–36.0)
MCV: 93.7 fl (ref 78.0–100.0)
MONO ABS: 0.5 10*3/uL (ref 0.1–1.0)
Monocytes Relative: 4.7 % (ref 3.0–12.0)
Neutro Abs: 7.1 10*3/uL (ref 1.4–7.7)
Neutrophils Relative %: 66.9 % (ref 43.0–77.0)
Platelets: 250 10*3/uL (ref 150.0–400.0)
RBC: 4.65 Mil/uL (ref 3.87–5.11)
RDW: 13.3 % (ref 11.5–14.6)
WBC: 10.6 10*3/uL — ABNORMAL HIGH (ref 4.5–10.5)

## 2014-02-10 LAB — HEPATIC FUNCTION PANEL
ALBUMIN: 4.3 g/dL (ref 3.5–5.2)
ALT: 18 U/L (ref 0–35)
AST: 20 U/L (ref 0–37)
Alkaline Phosphatase: 88 U/L (ref 39–117)
Bilirubin, Direct: 0.1 mg/dL (ref 0.0–0.3)
Total Bilirubin: 0.4 mg/dL (ref 0.3–1.2)
Total Protein: 7.8 g/dL (ref 6.0–8.3)

## 2014-02-10 LAB — TSH: TSH: 1.35 u[IU]/mL (ref 0.35–5.50)

## 2014-02-10 LAB — BASIC METABOLIC PANEL
BUN: 17 mg/dL (ref 6–23)
CO2: 26 mEq/L (ref 19–32)
CREATININE: 0.9 mg/dL (ref 0.4–1.2)
Calcium: 9.9 mg/dL (ref 8.4–10.5)
Chloride: 97 mEq/L (ref 96–112)
GFR: 73.9 mL/min (ref >60.00)
Glucose, Bld: 91 mg/dL (ref 70–99)
POTASSIUM: 4.3 meq/L (ref 3.5–5.1)
Sodium: 133 mEq/L — ABNORMAL LOW (ref 135–145)

## 2014-02-10 LAB — SEDIMENTATION RATE: SED RATE: 27 mm/h — AB (ref 0–22)

## 2014-02-10 LAB — POCT URINALYSIS DIPSTICK
Bilirubin, UA: NEGATIVE
Blood, UA: NEGATIVE
Glucose, UA: NEGATIVE
Ketones, UA: NEGATIVE
LEUKOCYTES UA: NEGATIVE
Nitrite, UA: NEGATIVE
PH UA: 5
PROTEIN UA: NEGATIVE
Spec Grav, UA: <=1.005
UROBILINOGEN UA: 0.2

## 2014-02-10 MED ORDER — OXYCODONE HCL 15 MG PO TABS: 15.0000 mg | ORAL_TABLET | Freq: Four times a day (QID) | ORAL | Status: DC | PRN

## 2014-02-10 NOTE — Progress Notes (Signed)
Pre visit review using our clinic review tool, if applicable. No additional management support is needed unless otherwise documented below in the visit note. 

## 2014-02-10 NOTE — Telephone Encounter (Signed)
Relevant patient education assigned to patient using Emmi. ° °

## 2014-02-10 NOTE — Progress Notes (Signed)
   Subjective:    Patient ID: [name], female    DOB: 04/26/1961, 53 y.o.   MRN: [id number]  HPI Patient has chronic problems including ongoing nicotine use, dyslipidemia, COPD, history of GERD, osteoarthritis. She's had chronic low back pain for many years. She's seen today mostly with complaints of progressive fatigue over about a month and half. She also describes worsening body aches mostly neck and shoulders and somewhat hips. Symptoms are fairly symmetric. She has lost 7 pounds in weight and states her appetite is diminished. She's not had any fever. She does have poor sleep quality in general. She has chronic back pain and takes oxycodone. Even with this she's had some breakthrough pains frequently. She's also complaining of occasional left flank pain. No hematuria. No dysuria. Denies any major headaches, abdominal pain,  Skin rash, stool changes. She was recently scheduled for colonoscopy but never went. She plans to reschedule.  Past Medical History  Diagnosis Date  . PANIC DISORDER 04/18/2007  . TOBACCO ABUSE 07/17/2009  . CARPAL TUNNEL SYNDROME, BILATERAL 01/16/2008  . ASTHMA UNSPECIFIED WITH EXACERBATION 07/21/2010  . COPD 05/08/2008  . GERD 04/18/2007  . HELICOBACTER PYLORI GASTRITIS, HX OF 04/18/2007   Past Surgical History  Procedure Laterality Date  . Appendectomy    . Oophorectomy      reports that she has been smoking Cigarettes.  She has a 50 pack-year smoking history. She does not have any smokeless tobacco history on file. Her alcohol and drug histories are not on file. family history includes Cancer in her father; Diabetes in her mother. Allergies  Allergen Reactions  . Aspirin     REACTION: had h. pylori due to too many goody powders. told not to take any more asa      Review of Systems  Constitutional: Positive for fatigue.  HENT: Negative for trouble swallowing.   Eyes: Negative for visual disturbance.  Respiratory: Negative for cough, chest tightness,  shortness of breath and wheezing.   Cardiovascular: Negative for chest pain, palpitations and leg swelling.  Endocrine: Negative for polydipsia and polyuria.  Genitourinary: Negative for dysuria.  Musculoskeletal: Positive for arthralgias and myalgias. Negative for joint swelling.  Neurological: Negative for dizziness, seizures, syncope, weakness, light-headedness and headaches.  Psychiatric/Behavioral: Negative for confusion.       Objective:   Physical Exam  Constitutional: She appears well-developed and well-nourished.  HENT:  Right Ear: External ear normal.  Left Ear: External ear normal.  Mouth/Throat: Oropharynx is clear and moist.  Neck: Neck supple. No thyromegaly present.  Cardiovascular: Normal rate and regular rhythm.   Pulmonary/Chest: Effort normal and breath sounds normal. No respiratory distress. She has no wheezes. She has no rales.  Abdominal: Soft. Bowel sounds are normal. She exhibits no distension and no mass. There is no rebound and no guarding.  Musculoskeletal: She exhibits no edema.  Lymphadenopathy:    She has no cervical adenopathy.          Assessment & Plan:  Patient presents with diffuse muscle aches- especially neck, shoulders, and hips along with progressive fatigue and some appetite and weight changes (loss) over the past month and a half. Rule out polymyalgia rheumatica. Check sedimentation rate and other labs with TSH, CBC, basic metabolic panel, UA, hepatic panel, and lipid panel. If sedimentation rate significantly elevated, consider trial of low-dose prednisone.

## 2014-02-11 LAB — LIPID PANEL
CHOLESTEROL: 392 mg/dL — AB (ref 0–200)
HDL: 34.3 mg/dL — ABNORMAL LOW (ref >39.00)
LDL Cholesterol: 7 mg/dL (ref 0–99)
Total CHOL/HDL Ratio: 11
Triglycerides: 1753 mg/dL — ABNORMAL HIGH (ref 0.0–149.0)
VLDL: 350.6 mg/dL — AB (ref 0.0–40.0)

## 2014-02-12 ENCOUNTER — Other Ambulatory Visit: Payer: Self-pay

## 2014-02-12 ENCOUNTER — Telehealth: Payer: Self-pay | Admitting: Family Medicine

## 2014-02-12 DIAGNOSIS — E785 Hyperlipidemia, unspecified: Secondary | ICD-10-CM

## 2014-02-12 MED ORDER — LOVASTATIN 40 MG PO TABS: 40.0000 mg | ORAL_TABLET | Freq: Every day | ORAL | Status: DC

## 2014-02-12 MED ORDER — FENOFIBRATE 145 MG PO TABS: 145.0000 mg | ORAL_TABLET | Freq: Every day | ORAL | Status: DC

## 2014-02-12 MED ORDER — PREDNISONE 10 MG PO TABS: 10.0000 mg | ORAL_TABLET | Freq: Two times a day (BID) | ORAL | Status: DC

## 2014-02-12 NOTE — Telephone Encounter (Signed)
Pt would like a call about her results [phone]

## 2014-02-12 NOTE — Telephone Encounter (Signed)
Pt informed

## 2014-02-28 ENCOUNTER — Telehealth: Payer: Self-pay | Admitting: Family Medicine

## 2014-02-28 NOTE — Telephone Encounter (Signed)
Pt would like for md to accept her daughter  As new pt who will be self-pay patient. Can I sch?

## 2014-02-28 NOTE — Telephone Encounter (Signed)
Pt daughter has [employer]. Pt mom is aware

## 2014-02-28 NOTE — Telephone Encounter (Signed)
yes

## 2014-03-11 ENCOUNTER — Ambulatory Visit (INDEPENDENT_AMBULATORY_CARE_PROVIDER_SITE_OTHER): Payer: No Typology Code available for payment source | Admitting: Family

## 2014-03-11 ENCOUNTER — Telehealth: Payer: Self-pay | Admitting: Family Medicine

## 2014-03-11 ENCOUNTER — Ambulatory Visit (INDEPENDENT_AMBULATORY_CARE_PROVIDER_SITE_OTHER)
Admission: RE | Admit: 2014-03-11 | Discharge: 2014-03-11 | Disposition: A | Discharge status: Home / Self Care | Payer: No Typology Code available for payment source | Source: Ambulatory Visit | Attending: Family | Admitting: Family

## 2014-03-11 ENCOUNTER — Encounter: Payer: Self-pay | Admitting: Family

## 2014-03-11 VITALS — BP 132/82 | HR 86 | Temp 98.3°F | Wt 160.0 lb

## 2014-03-11 DIAGNOSIS — R05 Cough: Secondary | ICD-10-CM

## 2014-03-11 DIAGNOSIS — J441 Chronic obstructive pulmonary disease with (acute) exacerbation: Secondary | ICD-10-CM

## 2014-03-11 DIAGNOSIS — R059 Cough, unspecified: Secondary | ICD-10-CM

## 2014-03-11 DIAGNOSIS — R062 Wheezing: Secondary | ICD-10-CM

## 2014-03-11 MED ORDER — BENZONATATE 200 MG PO CAPS: 200.0000 mg | ORAL_CAPSULE | Freq: Two times a day (BID) | ORAL | Status: DC | PRN

## 2014-03-11 MED ORDER — ALBUTEROL SULFATE HFA 108 (90 BASE) MCG/ACT IN AERS: 2.0000 | INHALATION_SPRAY | Freq: Four times a day (QID) | RESPIRATORY_TRACT | Status: DC | PRN

## 2014-03-11 MED ORDER — ALBUTEROL SULFATE (2.5 MG/3ML) 0.083% IN NEBU
2.5000 mg | INHALATION_SOLUTION | Freq: Once | RESPIRATORY_TRACT | Status: AC
  Administered 2014-03-11: 2.5 mg via RESPIRATORY_TRACT

## 2014-03-11 MED ORDER — PREDNISONE 20 MG PO TABS: ORAL_TABLET | ORAL | Status: AC

## 2014-03-11 NOTE — Progress Notes (Signed)
Subjective:    Patient ID: [name], female    DOB: 01/30/1961, 53 y.o.   MRN: [id number]  Cough Associated symptoms include a sore throat, shortness of breath and wheezing. Pertinent negatives include no chills or fever.   53 y.o. White female presents today with chief complaint of "I have been coughing since Saturday and can't breath". presents today with chief complaint of "I have been coughing since Saturday and can't breath". Pt has hx of uncontrolled COPD and asthma. She states that she has not been taking the Spiriva nor the QVAR because they are what make her cough. Pt states that she is coughing so often that she has to take her pain medicine to sleep. She acknowledges smoking on a daily basis. Pt states that she used her granddaughters albuterol nebulizer this morning to help with not being able to breath and noticed some improvement. Denies fever, chills, and change in appetite.     Review of Systems  Constitutional: Positive for fatigue. Negative for fever, chills and appetite change.  HENT: Positive for sore throat.   Eyes: Negative.   Respiratory: Positive for cough, shortness of breath and wheezing.   Cardiovascular: Negative.   Gastrointestinal: Negative.   Endocrine: Negative.   Musculoskeletal: Negative.   Skin: Negative.   Allergic/Immunologic: Negative.   Neurological: Positive for weakness.  Hematological: Negative.   Psychiatric/Behavioral: Negative.    Past Medical History  Diagnosis Date  . PANIC DISORDER 04/18/2007  . TOBACCO ABUSE 07/17/2009  . CARPAL TUNNEL SYNDROME, BILATERAL 01/16/2008  . ASTHMA UNSPECIFIED WITH EXACERBATION 07/21/2010  . COPD 05/08/2008  . GERD 04/18/2007  . HELICOBACTER PYLORI GASTRITIS, HX OF 04/18/2007    History   Social History  . Marital Status: Divorced    Spouse Name: N/A    Number of Children: N/A  . Years of Education: N/A   Occupational History  . Not on file.   Social History Main Topics  . Smoking status: Current Every Day Smoker -- 1.00 packs/day for 50 years    Types: Cigarettes  . Smokeless tobacco:  Not on file     Comment: Trying nicotine patches, made her nauseous at first, going to cut patches in half.  . Alcohol Use: Not on file  . Drug Use: Not on file  . Sexual Activity: Not on file   Other Topics Concern  . Not on file   Social History Narrative   lives with bf    [facility] cares of elderly person   Self employed    Long term smoker    Past Surgical History  Procedure Laterality Date  . Appendectomy    . Oophorectomy      Family History  Problem Relation Age of Onset  . Diabetes Mother   . Cancer Father     lung smoke     Allergies  Allergen Reactions  . Aspirin     REACTION: had h. pylori due to too many goody powders. told not to take any more asa    Current Outpatient Prescriptions on File Prior to Visit  Medication Sig Dispense Refill  . alprazolam (XANAX) 2 MG tablet TAKE TWO AND ONE-HALF (2 & 1/2) TABLETS BY MOUTH EVERY DAY  75 tablet  2  . beclomethasone (QVAR) 40 MCG/ACT inhaler Inhale 2 puffs into the lungs 2 (two) times daily.  1 Inhaler  11  . fenofibrate (TRICOR) 145 MG tablet Take 1 tablet (145 mg total) by mouth daily.  30 tablet  5  . lovastatin (MEVACOR) 40 MG tablet Take 1 tablet (40 mg total) by mouth  at bedtime.  30 tablet  5  . oxyCODONE (ROXICODONE) 15 MG immediate release tablet Take 1 tablet (15 mg total) by mouth every 6 (six) hours as needed.  120 tablet  0  . tiotropium (SPIRIVA) 18 MCG inhalation capsule Place 18 mcg into inhaler and inhale daily as needed.       No current facility-administered medications on file prior to visit.    BP 132/82  Pulse 86  Temp(Src) 98.3 F (36.8 C) (Oral)  Wt 160 lb (72.576 kg)  SpO2 90%chart    Objective:   Physical Exam  Constitutional: She is oriented to person, place, and time. She appears well-developed and well-nourished. She is active.  HENT:  Head: Normocephalic.  Right Ear: Tympanic membrane, external ear and ear canal normal.  Left Ear: Tympanic membrane, external ear and ear canal  normal.  Nose: Nose normal.  Mouth/Throat: Uvula is midline and mucous membranes are normal. Posterior oropharyngeal erythema present.  Neck: Trachea normal and normal range of motion. Neck supple.  Cardiovascular: Normal rate, regular rhythm, normal heart sounds and normal pulses.   Pulmonary/Chest: Accessory muscle usage present. Tachypnea noted. She is in respiratory distress. She has wheezes in the right upper field, the right lower field, the left upper field and the left lower field.  Abdominal: Soft. Normal appearance and bowel sounds are normal.  Neurological: She is alert and oriented to person, place, and time.  Skin: Skin is warm, dry and intact.  Psychiatric: Her speech is normal. Thought content normal. Her mood appears anxious. Her affect is labile. She is agitated.          Assessment & Plan:  53 y.o. White female presents with chief complaint of "I cant breath and coughing since Saturday". presents with chief complaint of "I cant breath and coughing since Saturday".  - Wheezing- Albuterol 2.5mg  nebulizer in office   - Atrovent nebulizer treatment in office.   - Lungs clear prior to discharge.  - COPD exacerbation   - Albuterol 2 puffs Q6hrs PRN for wheezing, SOB  - Chest X-ray   - Prednisone taper 60mg  x 3day/ 40mg  x 3 days / 20mg  x3 days - Coughing- Tessalon Pearl 200mg  p.o.   Education: Extensive education given about smoking cessation and dangerous effects of smoking and COPD. Pt education about taking controller medications Spiriva and Qvar as prescribed to help prevent exacerbation.   Follow up: 2 days.

## 2014-03-11 NOTE — Telephone Encounter (Signed)
Noted  

## 2014-03-11 NOTE — Patient Instructions (Signed)
Chronic Obstructive Pulmonary Disease  Chronic obstructive pulmonary disease (COPD) is a common lung condition in which airflow from the lungs is limited. COPD is a general term that can be used to describe many different lung problems that limit airflow, including both chronic bronchitis and emphysema.  If you have COPD, your lung function will probably never return to normal, but there are measures you can take to improve lung function and make yourself feel better.   CAUSES   · Smoking (common).    · Exposure to secondhand smoke.    · Genetic problems.  · Chronic inflammatory lung diseases or recurrent infections.  SYMPTOMS   · Shortness of breath, especially with physical activity.    · Deep, persistent (chronic) cough with a large amount of thick mucus.    · Wheezing.    · Rapid breaths (tachypnea).    · Gray or bluish discoloration (cyanosis) of the skin, especially in fingers, toes, or lips.    · Fatigue.    · Weight loss.    · Frequent infections or episodes when breathing symptoms become much worse (exacerbations).    · Chest tightness.  DIAGNOSIS   Your healthcare provider will take a medical history and perform a physical examination to make the initial diagnosis.  Additional tests for COPD may include:   · Lung (pulmonary) function tests.  · Chest X-ray.  · CT scan.  · Blood tests.  TREATMENT   Treatment available to help you feel better when you have COPD include:   · Inhaler and nebulizer medicines. These help manage the symptoms of COPD and make your breathing more comfortable  · Supplemental oxygen. Supplemental oxygen is only helpful if you have a low oxygen level in your blood.    · Exercise and physical activity. These are beneficial for nearly all people with COPD. Some people may also benefit from a pulmonary rehabilitation program.  HOME CARE INSTRUCTIONS   · Take all medicines (inhaled or pills) as directed by your health care provider.  · Only take over-the-counter or prescription medicines  for pain, fever, or discomfort as directed by your health care provider.    · Avoid over-the-counter medicines or cough syrups that dry up your airway (such as antihistamines) and slow down the elimination of secretions unless instructed otherwise by your healthcare provider.    · If you are a smoker, the most important thing that you can do is stop smoking. Continuing to smoke will cause further lung damage and breathing trouble. Ask your health care provider for help with quitting smoking. He or she can direct you to community resources or hospitals that provide support.  · Avoid exposure to irritants such as smoke, chemicals, and fumes that aggravate your breathing.  · Use oxygen therapy and pulmonary rehabilitation if directed by your health care provider. If you require home oxygen therapy, ask your healthcare provider whether you should purchase a pulse oximeter to measure your oxygen level at home.    · Avoid contact with individuals who have a contagious illness.  · Avoid extreme temperature and humidity changes.  · Eat healthy foods. Eating smaller, more frequent meals and resting before meals may help you maintain your strength.  · Stay active, but balance activity with periods of rest. Exercise and physical activity will help you maintain your ability to do things you want to do.  · Preventing infection and hospitalization is very important when you have COPD. Make sure to receive all the vaccines your health care provider recommends, especially the pneumococcal and influenza vaccines. Ask your healthcare provider whether you   need a pneumonia vaccine.  · Learn and use relaxation techniques to manage stress.  · Learn and use controlled breathing techniques as directed by your health care provider. Controlled breathing techniques include:    · Pursed lip breathing. Start by breathing in (inhaling) through your nose for 1 second. Then, purse your lips as if you were going to whistle and breathe out (exhale)  through the pursed lips for 2 seconds.    · Diaphragmatic breathing. Start by putting one hand on your abdomen just above your waist. Inhale slowly through your nose. The hand on your abdomen should move out. Then purse your lips and exhale slowly. You should be able to feel the hand on your abdomen moving in as you exhale.    · Learn and use controlled coughing to clear mucus from your lungs. Controlled coughing is a series of short, progressive coughs. The steps of controlled coughing are:    1. Lean your head slightly forward.    2. Breathe in deeply using diaphragmatic breathing.    3. Try to hold your breath for 3 seconds.    4. Keep your mouth slightly open while coughing twice.    5. Spit any mucus out into a tissue.    6. Rest and repeat the steps once or twice as needed.  SEEK MEDICAL CARE IF:   · You are coughing up more mucus than usual.    · There is a change in the color or thickness of your mucus.    · Your breathing is more labored than usual.    · Your breathing is faster than usual.    SEEK IMMEDIATE MEDICAL CARE IF:   · You have shortness of breath while you are resting.    · You have shortness of breath that prevents you from:  · Being able to talk.    · Performing your usual physical activities.    · You have chest pain lasting longer than 5 minutes.    · Your skin color is more cyanotic than usual.  · You measure low oxygen saturations for longer than 5 minutes with a pulse oximeter.  MAKE SURE YOU:   · Understand these instructions.  · Will watch your condition.  · Will get help right away if you are not doing well or get worse.  Document Released: 07/06/2005 Document Revised: 07/17/2013 Document Reviewed: 05/23/2013  [employer]® Patient Information ©2014 [employer].

## 2014-03-11 NOTE — Telephone Encounter (Signed)
Patient Information:  Caller Name: Brennan Bailey  Phone: [phone]  Patient: [name], [name]  Gender: Female  DOB: [birth year]  Age: 53 Years  PCP: Carolann Littler (Family Practice)  Pregnant: No  Office Follow Up:  Does the office need to follow up with this patient?: No  Instructions For The Office: N/A  RN Note:  Post Menopausal. Daughter states Patient developed frequent cough, onset 03/08/14. States cough is non-productive. States patient has history of COPD. States she has tried Delsym without relief. Daughter states Patient developed increased cough accompanied by difficulty breathing 03/11/14. Daughter states she gave Patient an Albuterol Nebulizer treatment ( Patient's Grandchild's Nebulizer) at approx. 4259 03/11/14 with some relief. States no wheezing noted. States "rattling" is heard in the chest. Denies presence of cyanosis. Care advice given per guidelines. Call back parameters reviewed. Daugher advised to take patient to the ED/call 911 if difficulty breathing increases/gasping for air. Daughter verbalizes understanding and agreeable.  Symptoms  Reason For Call & Symptoms: Cough  Reviewed Health History In EMR: Yes  Reviewed Medications In EMR: Yes  Reviewed Allergies In EMR: Yes  Reviewed Surgeries / Procedures: Yes  Date of Onset of Symptoms: 03/08/2014  Treatments Tried: Delsym, QVar, Spiriva  Treatments Tried Worked: No OB / GYN:  LMP: Unknown  Guideline(s) Used:  Cough  Asthma Attack  Disposition Per Guideline:   Go to ED Now (or to Office with PCP Approval)  Reason For Disposition Reached:   Patient sounds very sick or weak to the triager  Advice Given:  Drinking Liquids:  Try to drink normal amount of liquids (e.g., water). Being adequately hydrated makes it easier to cough up the sticky lung mucus.  Humidifier:   If the air is dry, use a cool mist humidifier to prevent drying of the upper airway.  Avoid Triggers:  Avoid known triggers of asthma attacks  (e.g., tobacco smoke, cats, other pets, feather pillows, exercise).  Call Back If:  You become worse.  Patient Will Follow Care Advice:  YES  Appointment Scheduled:  03/11/2014 09:45:00 Appointment Scheduled Provider:  Roxy Cedar [facility])

## 2014-03-11 NOTE — Progress Notes (Signed)
Pre visit review using our clinic review tool, if applicable. No additional management support is needed unless otherwise documented below in the visit note. 

## 2014-03-13 ENCOUNTER — Ambulatory Visit (INDEPENDENT_AMBULATORY_CARE_PROVIDER_SITE_OTHER): Payer: No Typology Code available for payment source | Admitting: Family Medicine

## 2014-03-13 ENCOUNTER — Encounter: Payer: Self-pay | Admitting: Family Medicine

## 2014-03-13 VITALS — BP 130/82 | HR 106 | Temp 98.1°F | Wt 157.0 lb

## 2014-03-13 DIAGNOSIS — M545 Low back pain, unspecified: Secondary | ICD-10-CM

## 2014-03-13 DIAGNOSIS — J441 Chronic obstructive pulmonary disease with (acute) exacerbation: Secondary | ICD-10-CM

## 2014-03-13 DIAGNOSIS — G8929 Other chronic pain: Secondary | ICD-10-CM

## 2014-03-13 MED ORDER — OXYCODONE HCL 15 MG PO TABS: 15.0000 mg | ORAL_TABLET | Freq: Four times a day (QID) | ORAL | Status: DC | PRN

## 2014-03-13 NOTE — Progress Notes (Signed)
Pre visit review using our clinic review tool, if applicable. No additional management support is needed unless otherwise documented below in the visit note. 

## 2014-03-13 NOTE — Patient Instructions (Signed)
Follow up for any fever or increasing shortness or breath

## 2014-03-13 NOTE — Progress Notes (Signed)
   Subjective:    Patient ID: [name], female    DOB: 1960-12-29, 53 y.o.   MRN: [id number]  HPI Patient with persistent cough. Upper respiratory infection with on set up several days ago. Using 2 days ago placed on prednisone along with Tessalon Perles. She has not seen improvement in cough with Tessalon. Prednisone makes her feel height. She has home nebulizer which she is using sporadically. She has prescriptions for Spiriva and Qvar but has not been using either one consistently. Denies any fever. Cough is dry. She does have some dyspnea with exertion. Increased wheezing. She is very interested in trying to quit smoking. She smoked for several years. Chest x-ray couple days ago revealed no acute findings.  Chronic back pain. She has for many years been on oxycodone.  We have recommended several times the past on a scale back.. She's been unsuccessful. No recent constipation issues. Back pain stable. No focal weakness.  Past Medical History  Diagnosis Date  . PANIC DISORDER 04/18/2007  . TOBACCO ABUSE 07/17/2009  . CARPAL TUNNEL SYNDROME, BILATERAL 01/16/2008  . ASTHMA UNSPECIFIED WITH EXACERBATION 07/21/2010  . COPD 05/08/2008  . GERD 04/18/2007  . HELICOBACTER PYLORI GASTRITIS, HX OF 04/18/2007   Past Surgical History  Procedure Laterality Date  . Appendectomy    . Oophorectomy      reports that she has been smoking Cigarettes.  She has a 50 pack-year smoking history. She does not have any smokeless tobacco history on file. Her alcohol and drug histories are not on file. family history includes Cancer in her father; Diabetes in her mother. Allergies  Allergen Reactions  . Aspirin     REACTION: had h. pylori due to too many goody powders. told not to take any more asa      Review of Systems  Constitutional: Positive for fatigue. Negative for fever and chills.  HENT: Positive for congestion. Negative for sore throat.   Respiratory: Positive for cough, shortness of breath and  wheezing.   Cardiovascular: Negative for chest pain and leg swelling.  Musculoskeletal: Positive for back pain.  Neurological: Negative for headaches.       Objective:   Physical Exam  Constitutional: She appears well-developed and well-nourished.  HENT:  Right Ear: External ear normal.  Left Ear: External ear normal.  Mouth/Throat: Oropharynx is clear and moist.  Neck: Neck supple.  Cardiovascular: Normal rate and regular rhythm.   Pulmonary/Chest: Effort normal. She has wheezes. She has no rales.  Slightly diminished breath sounds at both bases. She has diffuse wheezes. No retractions. No rales.  Musculoskeletal: She exhibits no edema.  Lymphadenopathy:    She has no cervical adenopathy.          Assessment & Plan:  #1 acute exacerbation of COPD. Pulse oximetry 95%. No respiratory distress. Finish out prednisone. She's not having any fever or other indication for antibiotic at this time. Question viral trigger. We discussed appropriate role for Spiriva and Qvar and recommended she take daily. Smoking cessation counseling given  #2 chronic low back pain.Refill oxycodone for one month

## 2014-03-31 ENCOUNTER — Other Ambulatory Visit: Payer: Self-pay | Admitting: Family Medicine

## 2014-03-31 NOTE — Telephone Encounter (Signed)
Last visit 03/13/14 Last refill 12/30/13 #75 2 refills

## 2014-04-01 NOTE — Telephone Encounter (Signed)
Refill for 3 months. 

## 2014-04-07 ENCOUNTER — Telehealth: Payer: Self-pay | Admitting: Family Medicine

## 2014-04-07 NOTE — Telephone Encounter (Signed)
Last visit 03/13/14 Last refill 03/13/14 #120 0 refill

## 2014-04-07 NOTE — Telephone Encounter (Signed)
Pt would like a refill of oxyCODONE (ROXICODONE) 15 MG immediate release tablet Pt states her rx is due sun, but knows we will be closed fri.  Pt states dr had her take a few extra bc she was sick a couple of weeks ago.

## 2014-04-08 NOTE — Telephone Encounter (Signed)
May refill by Thursday-July 2

## 2014-04-10 MED ORDER — OXYCODONE HCL 15 MG PO TABS: 15.0000 mg | ORAL_TABLET | Freq: Four times a day (QID) | ORAL | Status: DC | PRN

## 2014-04-10 NOTE — Telephone Encounter (Signed)
Pt aware that RX is ready for pick up  

## 2014-04-14 ENCOUNTER — Ambulatory Visit (INDEPENDENT_AMBULATORY_CARE_PROVIDER_SITE_OTHER): Payer: No Typology Code available for payment source | Admitting: Gastroenterology

## 2014-04-14 ENCOUNTER — Encounter: Payer: Self-pay | Admitting: Gastroenterology

## 2014-04-14 VITALS — BP 110/70 | HR 60 | Ht 67.0 in | Wt 156.6 lb

## 2014-04-14 DIAGNOSIS — K219 Gastro-esophageal reflux disease without esophagitis: Secondary | ICD-10-CM

## 2014-04-14 DIAGNOSIS — Z1211 Encounter for screening for malignant neoplasm of colon: Secondary | ICD-10-CM

## 2014-04-14 MED ORDER — OMEPRAZOLE 20 MG PO CPDR: 20.0000 mg | DELAYED_RELEASE_CAPSULE | Freq: Every day | ORAL | Status: DC

## 2014-04-14 MED ORDER — NA SULFATE-K SULFATE-MG SULF 17.5-3.13-1.6 GM/177ML PO SOLN: 1.0000 | Freq: Once | ORAL | Status: DC

## 2014-04-14 NOTE — Assessment & Plan Note (Signed)
Patient is symptomatic despite therapy with Zantac.    Recommendations #1 begin omeprazole 20 mg daily; DC Zantac

## 2014-04-14 NOTE — Patient Instructions (Signed)

## 2014-04-14 NOTE — Progress Notes (Signed)
_                                                                                                                History of Present Illness:  53 year old white female referred for screening colonoscopy.  Last exam was about 10 years ago and apparently was normal.  Mother has had multiple polyps.  The patient's main GI complaint is pyrosis for which she takes Zantac.  She denies dysphagia, cough or hoarseness.  This has been a problem for years.  She denies change of bowel habits, abdominal pain, melena or hematochezia.  She has a history of H. pylori which was treated.    Past Medical History  Diagnosis Date  . PANIC DISORDER 04/18/2007  . TOBACCO ABUSE 07/17/2009  . CARPAL TUNNEL SYNDROME, BILATERAL 01/16/2008  . ASTHMA UNSPECIFIED WITH EXACERBATION 07/21/2010  . COPD 05/08/2008  . GERD 04/18/2007  . HELICOBACTER PYLORI GASTRITIS, HX OF 04/18/2007   Past Surgical History  Procedure Laterality Date  . Appendectomy    . Oophorectomy     family history includes Breast cancer in her paternal aunt; Cancer in her father; Clotting disorder in her brother; Diabetes in her mother; Heart disease in her father; Stomach cancer in her paternal uncle. Current Outpatient Prescriptions  Medication Sig Dispense Refill  . albuterol (PROVENTIL HFA;VENTOLIN HFA) 108 (90 BASE) MCG/ACT inhaler Inhale 2 puffs into the lungs every 6 (six) hours as needed for wheezing or shortness of breath.  1 Inhaler  0  . alprazolam (XANAX) 2 MG tablet TAKE TWO AND ONE-HALF (2 & 1/2) TABLETS BY MOUTH EVERY DAY  75 tablet  2  . beclomethasone (QVAR) 40 MCG/ACT inhaler Inhale 2 puffs into the lungs 2 (two) times daily.  1 Inhaler  11  . benzonatate (TESSALON) 200 MG capsule Take 1 capsule (200 mg total) by mouth 2 (two) times daily as needed for cough.  20 capsule  0  . lovastatin (MEVACOR) 40 MG tablet Take 1 tablet (40 mg total) by mouth at bedtime.  30 tablet  5  . oxyCODONE (ROXICODONE) 15 MG immediate release  tablet Take 1 tablet (15 mg total) by mouth every 6 (six) hours as needed.  120 tablet  0  . tiotropium (SPIRIVA) 18 MCG inhalation capsule Place 18 mcg into inhaler and inhale daily as needed.      . Na Sulfate-K Sulfate-Mg Sulf (SUPREP BOWEL PREP) SOLN Take 1 kit by mouth once.  1 Bottle  0   No current facility-administered medications for this visit.   Allergies as of 04/14/2014 - Review Complete 04/14/2014  Allergen Reaction Noted  . Aspirin  01/02/2007    reports that she has been smoking Cigarettes.  She has a 50 pack-year smoking history. She has never used smokeless tobacco. She reports that she does not drink alcohol or use illicit drugs.     Review of Systems: Pertinent positive and negative review of systems were noted in the above HPI section. All other review of systems were otherwise  negative.  Vital signs were reviewed in today's medical record Physical Exam: General: Well developed , well nourished, no acute distress Skin: anicteric Head: Normocephalic and atraumatic Eyes:  sclerae anicteric, EOMI Ears: Normal auditory acuity Mouth: No deformity or lesions Neck: Supple, no masses or thyromegaly Lungs: Clear throughout to auscultation Heart: Regular rate and rhythm; no murmurs, rubs or bruits Abdomen: Soft, non tender and non distended. No masses, hepatosplenomegaly or hernias noted. Normal Bowel sounds Rectal:deferred Musculoskeletal: Symmetrical with no gross deformities  Skin: No lesions on visible extremities Pulses:  Normal pulses noted Extremities: No clubbing, cyanosis, edema or deformities noted Neurological: Alert oriented x 4, grossly nonfocal Cervical Nodes:  No significant cervical adenopathy Inguinal Nodes: No significant inguinal adenopathy Psychological:  Alert and cooperative. Normal mood and affect  See Assessment and Plan under Problem List

## 2014-04-14 NOTE — Assessment & Plan Note (Signed)
Plan screening colonoscopy 

## 2014-04-15 ENCOUNTER — Encounter: Payer: Self-pay | Admitting: Gastroenterology

## 2014-04-24 ENCOUNTER — Telehealth: Payer: Self-pay | Admitting: Gastroenterology

## 2014-04-24 NOTE — Telephone Encounter (Signed)
Called pharmacy , its not her PPI that is too expensive its her Prep kit  Already had given Suprep to pt when she was in the office  Called [facility] and cancelled suprep script

## 2014-05-07 ENCOUNTER — Telehealth: Payer: Self-pay | Admitting: Family Medicine

## 2014-05-07 NOTE — Telephone Encounter (Signed)
Last visit 04/14/14 Last refill 04/10/14 #120 0 refill

## 2014-05-07 NOTE — Telephone Encounter (Signed)
Refill by Friday 

## 2014-05-07 NOTE — Telephone Encounter (Signed)
Pt is needing new rx oxyCODONE (ROXICODONE) 15 MG immediate release tablet please call when available for pick. Pt states she leaving to go to the beach on Friday, requesting to pick up by Friday.

## 2014-05-08 MED ORDER — OXYCODONE HCL 15 MG PO TABS: 15.0000 mg | ORAL_TABLET | Freq: Four times a day (QID) | ORAL | Status: DC | PRN

## 2014-05-08 NOTE — Telephone Encounter (Signed)
Refill next RX after the 05/11/2014. Pt aware that RX is ready for pick up

## 2014-06-02 ENCOUNTER — Ambulatory Visit (AMBULATORY_SURGERY_CENTER): Payer: No Typology Code available for payment source | Admitting: Gastroenterology

## 2014-06-02 ENCOUNTER — Encounter: Payer: Self-pay | Admitting: Gastroenterology

## 2014-06-02 VITALS — BP 145/80 | HR 68 | Temp 98.7°F | Resp 25 | Ht 67.0 in | Wt 156.0 lb

## 2014-06-02 DIAGNOSIS — K5730 Diverticulosis of large intestine without perforation or abscess without bleeding: Secondary | ICD-10-CM

## 2014-06-02 DIAGNOSIS — Z1211 Encounter for screening for malignant neoplasm of colon: Secondary | ICD-10-CM

## 2014-06-02 MED ORDER — SODIUM CHLORIDE 0.9 % IV SOLN: 500.0000 mL | INTRAVENOUS | Status: DC

## 2014-06-02 NOTE — Op Note (Signed)
[facility]  [address] [place], [id number]   COLONOSCOPY PROCEDURE REPORT  PATIENT: [name], [name]  MR#: [id number] BIRTHDATE: [birth year] , 27  yrs. old GENDER: Female ENDOSCOPIST: Inda Castle, MD REFERRED VZ:DGLOV Elease Hashimoto, M.D. PROCEDURE DATE:  06/02/2014 PROCEDURE:   Colonoscopy, diagnostic First Screening Colonoscopy - Avg.  risk and is 50 yrs.  old or older - No.  Prior Negative Screening - Now for repeat screening. 10 or more years since last screening  History of Adenoma - Now for follow-up colonoscopy & has been > or = to 3 yrs.  N/A  Polyps Removed Today? No.  Recommend repeat exam, <10 yrs? No. ASA CLASS:   Class II INDICATIONS:Average risk patient for colon cancer. MEDICATIONS: MAC sedation, administered by CRNA and propofol (Diprivan) 250mg  IV  DESCRIPTION OF PROCEDURE:   After the risks benefits and alternatives of the procedure were thoroughly explained, informed consent was obtained.  A digital rectal exam revealed no abnormalities of the rectum.   The LB [id number]  endoscope was introduced through the anus and advanced to the cecum, which was identified by both the appendix and ileocecal valve. No adverse events experienced.   The quality of the prep was excellent using Suprep  The instrument was then slowly withdrawn as the colon was fully examined.      COLON FINDINGS: Mild diverticulosis was noted in the sigmoid colon and descending colon.   The colon was otherwise normal.  There was no diverticulosis, inflammation, polyps or cancers unless previously stated.  Retroflexed views revealed no abnormalities. The time to cecum=4 minutes 49 seconds.  Withdrawal time=7 minutes 02 seconds.  The scope was withdrawn and the procedure completed. COMPLICATIONS: There were no complications.  ENDOSCOPIC IMPRESSION: 1.   Mild diverticulosis was noted in the sigmoid colon and descending colon 2.   The colon was otherwise  normal  RECOMMENDATIONS: Continue current colorectal screening recommendations for "routine risk" patients with a repeat colonoscopy in 10 years.   eSigned:  Inda Castle, MD 06/02/2014 2:27 PM   cc:   PATIENT NAME:  [name], [name] MR#: [id number]

## 2014-06-02 NOTE — Patient Instructions (Signed)
YOU HAD AN ENDOSCOPIC PROCEDURE TODAY AT [facility]: Refer to the procedure report that was given to you for any specific questions about what was found during the examination.  If the procedure report does not answer your questions, please call your gastroenterologist to clarify.  If you requested that your care partner not be given the details of your procedure findings, then the procedure report has been included in a sealed envelope for you to review at your convenience later.  YOU SHOULD EXPECT: Some feelings of bloating in the abdomen. Passage of more gas than usual.  Walking can help get rid of the air that was put into your GI tract during the procedure and reduce the bloating. If you had a lower endoscopy (such as a colonoscopy or flexible sigmoidoscopy) you may notice spotting of blood in your stool or on the toilet paper. If you underwent a bowel prep for your procedure, then you may not have a normal bowel movement for a few days.  DIET: Your first meal following the procedure should be a light meal and then it is ok to progress to your normal diet.  A half-sandwich or bowl of soup is an example of a good first meal.  Heavy or fried foods are harder to digest and may make you feel nauseous or bloated.  Likewise meals heavy in dairy and vegetables can cause extra gas to form and this can also increase the bloating.  Drink plenty of fluids but you should avoid alcoholic beverages for 24 hours.  ACTIVITY: Your care partner should take you home directly after the procedure.  You should plan to take it easy, moving slowly for the rest of the day.  You can resume normal activity the day after the procedure however you should NOT DRIVE or use heavy machinery for 24 hours (because of the sedation medicines used during the test).    SYMPTOMS TO REPORT IMMEDIATELY: A gastroenterologist can be reached at any hour.  During normal business hours, 8:30 AM to 5:00 PM Monday through Friday,  call [phone].  After hours and on weekends, please call the GI answering service at [phone] who will take a message and have the physician on call contact you.   Following lower endoscopy (colonoscopy or flexible sigmoidoscopy):  Excessive amounts of blood in the stool  Significant tenderness or worsening of abdominal pains  Swelling of the abdomen that is new, acute  Fever of 100F or higher  FOLLOW UP: Our staff will call the home number listed on your records the next business day following your procedure to check on you and address any questions or concerns that you may have at that time regarding the information given to you following your procedure. This is a courtesy call and so if there is no answer at the home number and we have not heard from you through the emergency physician on call, we will assume that you have returned to your regular daily activities without incident.  SIGNATURES/CONFIDENTIALITY: You and/or your care partner have signed paperwork which will be entered into your electronic medical record.  These signatures attest to the fact that that the information above on your After Visit Summary has been reviewed and is understood.  Full responsibility of the confidentiality of this discharge information lies with you and/or your care-partner.  Next colonoscopy in 10 years  Please read over handouts about diverticulosis and high fiber diets  Continue your normal medications

## 2014-06-02 NOTE — Progress Notes (Signed)
Procedure ends, to recovery, report given and VSS. 

## 2014-06-03 ENCOUNTER — Telehealth: Payer: Self-pay

## 2014-06-03 NOTE — Telephone Encounter (Signed)
Attempt follow up call post procedure, no telephone number listed.

## 2014-06-09 ENCOUNTER — Encounter: Payer: Self-pay | Admitting: Family Medicine

## 2014-06-09 ENCOUNTER — Ambulatory Visit (INDEPENDENT_AMBULATORY_CARE_PROVIDER_SITE_OTHER): Payer: No Typology Code available for payment source | Admitting: Family Medicine

## 2014-06-09 VITALS — BP 130/78 | HR 84 | Temp 98.0°F | Wt 156.0 lb

## 2014-06-09 DIAGNOSIS — R0789 Other chest pain: Secondary | ICD-10-CM

## 2014-06-09 MED ORDER — OXYCODONE HCL 15 MG PO TABS: 15.0000 mg | ORAL_TABLET | Freq: Four times a day (QID) | ORAL | Status: DC | PRN

## 2014-06-09 NOTE — Patient Instructions (Signed)

## 2014-06-09 NOTE — Progress Notes (Signed)
   Subjective:    Patient ID: [name], female    DOB: 1961/06/15, 53 y.o.   MRN: [id number]  Cough Associated symptoms include chest pain. Pertinent negatives include no chills, fever, shortness of breath or wheezing.   Patient seen for evaluation regarding atypical chest pains recently. She has some chronic intermittent cough and still smokes but is trying to quit. Her cough is using productive of clear sputum. No hemoptysis. She describes a few episodes recently of chest pain. These do not occur consistently with exertion and frequently at rest. Achy quality. Substernal. No radiation. She had one episode couple nights ago that improved after taking the Xanax. She had some mild nausea but no vomiting. No diaphoresis. She does fairly physical yard work with things like push mowing without any associated history of chest pain.  Her risk factors for CAD include ongoing nicotine use, dyslipidemia, and mother had CAD history. She has history of GERD but states her recent symptoms have been somewhat different.  Past Medical History  Diagnosis Date  . PANIC DISORDER 04/18/2007  . TOBACCO ABUSE 07/17/2009  . CARPAL TUNNEL SYNDROME, BILATERAL 01/16/2008  . ASTHMA UNSPECIFIED WITH EXACERBATION 07/21/2010  . COPD 05/08/2008  . GERD 04/18/2007  . HELICOBACTER PYLORI GASTRITIS, HX OF 04/18/2007   Past Surgical History  Procedure Laterality Date  . Appendectomy    . Oophorectomy      reports that she has been smoking Cigarettes.  She has a 50 pack-year smoking history. She has never used smokeless tobacco. She reports that she does not drink alcohol or use illicit drugs. family history includes Breast cancer in her paternal aunt; Cancer in her father; Clotting disorder in her brother; Diabetes in her mother; Heart disease in her father; Stomach cancer in her paternal uncle. Allergies  Allergen Reactions  . Aspirin     REACTION: had h. pylori due to too many goody powders. told not to take any more asa        Review of Systems  Constitutional: Negative for fever, chills, appetite change and unexpected weight change.  HENT: Negative for trouble swallowing.   Respiratory: Positive for cough. Negative for shortness of breath and wheezing.   Cardiovascular: Positive for chest pain. Negative for palpitations and leg swelling.  Gastrointestinal: Negative for abdominal pain.  Musculoskeletal: Positive for back pain.       Objective:   Physical Exam  Constitutional: She appears well-developed and well-nourished.  Neck: Neck supple.  Cardiovascular: Normal rate and regular rhythm.  Exam reveals no gallop.   No murmur heard. Pulmonary/Chest: Effort normal and breath sounds normal. No respiratory distress. She has no wheezes. She has no rales.  Musculoskeletal: She exhibits no edema.          Assessment & Plan:  Atypical chest pain. EKG reveals sinus rhythm with no acute changes. Her risk factors include smoking status, dyslipidemia, and family history. Set up nuclear stress test for further evaluation and she agrees

## 2014-06-09 NOTE — Progress Notes (Signed)
Pre visit review using our clinic review tool, if applicable. No additional management support is needed unless otherwise documented below in the visit note. 

## 2014-06-13 ENCOUNTER — Telehealth: Payer: Self-pay | Admitting: Family Medicine

## 2014-06-13 MED ORDER — BENZONATATE 200 MG PO CAPS: ORAL_CAPSULE | ORAL | Status: DC

## 2014-06-13 NOTE — Telephone Encounter (Signed)
Benzonatate 200 mg one every 8 hours as needed for cough #30

## 2014-06-13 NOTE — Telephone Encounter (Signed)
Pt called to ask for a rx on BENZONZTATE for cough    Pharmacy  [facility]

## 2014-06-13 NOTE — Telephone Encounter (Signed)
RX sent to pharmacy  

## 2014-06-13 NOTE — Telephone Encounter (Signed)
Is it okay? 

## 2014-06-23 ENCOUNTER — Ambulatory Visit (HOSPITAL_COMMUNITY): Payer: No Typology Code available for payment source | Attending: Family Medicine | Admitting: Radiology

## 2014-06-23 VITALS — BP 120/83 | HR 72 | Ht 67.0 in | Wt 155.0 lb

## 2014-06-23 DIAGNOSIS — J45909 Unspecified asthma, uncomplicated: Secondary | ICD-10-CM | POA: Insufficient documentation

## 2014-06-23 DIAGNOSIS — R079 Chest pain, unspecified: Secondary | ICD-10-CM | POA: Insufficient documentation

## 2014-06-23 DIAGNOSIS — R0789 Other chest pain: Secondary | ICD-10-CM

## 2014-06-23 MED ORDER — TECHNETIUM TC 99M SESTAMIBI GENERIC - CARDIOLITE
33.0000 | Freq: Once | INTRAVENOUS | Status: AC | PRN
  Administered 2014-06-23: 33 via INTRAVENOUS

## 2014-06-23 MED ORDER — TECHNETIUM TC 99M SESTAMIBI GENERIC - CARDIOLITE
11.0000 | Freq: Once | INTRAVENOUS | Status: AC | PRN
  Administered 2014-06-23: 11 via INTRAVENOUS

## 2014-06-23 NOTE — Progress Notes (Signed)
[facility] [address] [place], [place] [address] [phone]    Cardiology Nuclear Med Study  [name] is a 53 y.o. female     MRN : [id number]     DOB: [birth year]  Procedure Date: 06/23/2014  Nuclear Med Background Indication for Stress Test:  Evaluation for Ischemia History:  Asthma Cardiac Risk Factors: none  Symptoms:  Chest Pain (last date of chest discomfort was yesterday)   Nuclear Pre-Procedure Caffeine/Decaff Intake:  None NPO After: 8:30pm   Lungs:  clear O2 Sat: 98% on room air. IV 0.9% NS with Angio Cath:  22g  IV Site: R Wrist  IV Started by:  Matilde Haymaker, RN  Chest Size (in):  34 Cup Size: C  Height: 5\' 7"  (1.702 m)  Weight:  155 lb (70.308 kg)  BMI:  Body mass index is 24.27 kg/(m^2). Tech Comments:  n/a    Nuclear Med Study 1 or 2 day study: 1 day  Stress Test Type:  Stress  Reading MD: n/a  Order Authorizing Provider:  Darnell Level Burchette,MD  Resting Radionuclide: Technetium 3m Sestamibi  Resting Radionuclide Dose: 11.0 mCi   Stress Radionuclide:  Technetium 34m Sestamibi  Stress Radionuclide Dose: 33.0 mCi           Stress Protocol Rest HR: 72 Stress HR: 144  Rest BP: 120/83 Stress BP: 170/77  Exercise Time (min): 6:00 METS: 7.0           Dose of Adenosine (mg):  n/a Dose of Lexiscan: n/a mg  Dose of Atropine (mg): n/a Dose of Dobutamine: n/a mcg/kg/min (at max HR)  Stress Test Technologist: Glade Lloyd, BS-ES  Nuclear Technologist:  Vedia Pereyra, CNMT     Rest Procedure:  Myocardial perfusion imaging was performed at rest 45 minutes following the intravenous administration of Technetium 108m Sestamibi. Rest ECG: NSR with non-specific ST-T wave changes  Stress Procedure:  The patient exercised on the treadmill utilizing the Bruce Protocol for 6:00 minutes. The patient stopped due to fatigue and denied any chest pain.  Technetium 72m Sestamibi was injected at peak exercise and myocardial perfusion  imaging was performed after a brief delay. Stress ECG: No significant change from baseline ECG  QPS Raw Data Images:  Mild diaphragmatic attenuation.  Normal left ventricular size. Stress Images:  There is decreased uptake in the inferior wall. Rest Images:  There is decreased uptake in the inferior wall. Subtraction (SDS):  No evidence of ischemia. Transient Ischemic Dilatation (Normal <1.22):  1.01 Lung/Heart Ratio (Normal <0.45):  0.36  Quantitative Gated Spect Images QGS EDV:  71 ml QGS ESV:  29 ml  Impression Exercise Capacity:  Good exercise capacity. BP Response:  Normal blood pressure response. Clinical Symptoms:  No significant symptoms noted. ECG Impression:  No significant ST segment change suggestive of ischemia. Comparison with Prior Nuclear Study: No previous nuclear study performed  Overall Impression:  Low risk stress nuclear study with mildly reduced inferior uptake (SDS 3), which is not significant for ischemia.  LV Ejection Fraction: 60%.  LV Wall Motion:  Normal Wall Motion  Pixie Casino, MD, [facility] Board Certified in Nuclear Cardiology Attending Cardiologist [place]

## 2014-06-30 ENCOUNTER — Other Ambulatory Visit: Payer: Self-pay | Admitting: Family Medicine

## 2014-06-30 NOTE — Telephone Encounter (Signed)
Last visit 06/09/14 Last refill 04/01/14 #75 2 refill

## 2014-06-30 NOTE — Telephone Encounter (Signed)
Refill for 3 months. 

## 2014-07-07 ENCOUNTER — Encounter: Payer: Self-pay | Admitting: Family Medicine

## 2014-07-07 ENCOUNTER — Ambulatory Visit (INDEPENDENT_AMBULATORY_CARE_PROVIDER_SITE_OTHER): Payer: No Typology Code available for payment source | Admitting: Family Medicine

## 2014-07-07 VITALS — BP 130/80 | HR 84 | Wt 153.0 lb

## 2014-07-07 DIAGNOSIS — M549 Dorsalgia, unspecified: Secondary | ICD-10-CM

## 2014-07-07 DIAGNOSIS — G8929 Other chronic pain: Secondary | ICD-10-CM

## 2014-07-07 MED ORDER — OXYCODONE HCL 15 MG PO TABS: 15.0000 mg | ORAL_TABLET | Freq: Three times a day (TID) | ORAL | Status: DC | PRN

## 2014-07-07 NOTE — Progress Notes (Signed)
   Subjective:    Patient ID: [name], female    DOB: [birth year], 53 y.o.   MRN: [id number]  Anxiety Symptoms include nervous/anxious behavior.     Patient here complaining of anxiety and discuss her chronic pain management. She's been on for many years on opioids for chronic back pain. She came to me on those. She states her daughter was recently put in drug rehabilitation for cocaine abuse and her daughter admitted to taking a few of her pain pills last week. Patient states these were hidden in her house but not under lock and key. She has custody of her 2 grandchildren while her daughter is a 26 day rehabilitation. She has been on many SSRIs and also Cymbalta in the past but had side effects.  Past Medical History  Diagnosis Date  . PANIC DISORDER 04/18/2007  . TOBACCO ABUSE 07/17/2009  . CARPAL TUNNEL SYNDROME, BILATERAL 01/16/2008  . ASTHMA UNSPECIFIED WITH EXACERBATION 07/21/2010  . COPD 05/08/2008  . GERD 04/18/2007  . HELICOBACTER PYLORI GASTRITIS, HX OF 04/18/2007   Past Surgical History  Procedure Laterality Date  . Appendectomy    . Oophorectomy      reports that she has been smoking Cigarettes.  She has a 50 pack-year smoking history. She has never used smokeless tobacco. She reports that she does not drink alcohol or use illicit drugs. family history includes Breast cancer in her paternal aunt; Cancer in her father; Clotting disorder in her brother; Diabetes in her mother; Heart disease in her father; Stomach cancer in her paternal uncle. Allergies  Allergen Reactions  . Aspirin     REACTION: had h. pylori due to too many goody powders. told not to take any more asa      Review of Systems  Constitutional: Negative for fever, appetite change and unexpected weight change.  Genitourinary: Negative for dysuria.  Musculoskeletal: Positive for back pain.  Psychiatric/Behavioral: Negative for agitation. The patient is nervous/anxious.        Objective:   Physical Exam    Constitutional: She appears well-developed and well-nourished.  Cardiovascular: Normal rate and regular rhythm.   Pulmonary/Chest: Effort normal and breath sounds normal. No respiratory distress. She has no wheezes. She has no rales.          Assessment & Plan:  Chronic back pain. We have discussed our strong recommendation that she try to scale back to 3 times a day and we will look at trying to scale that dosage back further in future. We've discussed possibly pain management referral if she's not doing well with scaling back. She is strongly admonished to keep all control medications under lock and key at all times and safely out of reach of others

## 2014-07-07 NOTE — Progress Notes (Signed)
Pre visit review using our clinic review tool, if applicable. No additional management support is needed unless otherwise documented below in the visit note. 

## 2014-08-04 ENCOUNTER — Telehealth: Payer: Self-pay | Admitting: Family Medicine

## 2014-08-04 NOTE — Telephone Encounter (Signed)
Refill by 10-28

## 2014-08-04 NOTE — Telephone Encounter (Signed)
Last visit 07/07/14 Last refill 07/07/14 #90 0 refill

## 2014-08-04 NOTE — Telephone Encounter (Signed)
Pt request refill of the following: oxyCODONE (ROXICODONE) 15 MG immediate release tablet    Phamacy: [facility]

## 2014-08-06 ENCOUNTER — Encounter: Payer: Self-pay | Admitting: Family Medicine

## 2014-08-06 ENCOUNTER — Ambulatory Visit (INDEPENDENT_AMBULATORY_CARE_PROVIDER_SITE_OTHER): Payer: No Typology Code available for payment source | Admitting: Family Medicine

## 2014-08-06 VITALS — BP 124/70 | HR 75 | Temp 97.8°F | Wt 152.0 lb

## 2014-08-06 DIAGNOSIS — J029 Acute pharyngitis, unspecified: Secondary | ICD-10-CM

## 2014-08-06 DIAGNOSIS — Z23 Encounter for immunization: Secondary | ICD-10-CM

## 2014-08-06 LAB — POCT RAPID STREP A (OFFICE): RAPID STREP A SCREEN: NEGATIVE

## 2014-08-06 MED ORDER — OXYCODONE HCL 15 MG PO TABS: 15.0000 mg | ORAL_TABLET | Freq: Three times a day (TID) | ORAL | Status: DC | PRN

## 2014-08-06 NOTE — Patient Instructions (Signed)

## 2014-08-06 NOTE — Progress Notes (Signed)
Pre visit review using our clinic review tool, if applicable. No additional management support is needed unless otherwise documented below in the visit note. 

## 2014-08-06 NOTE — Progress Notes (Signed)
   Subjective:    Patient ID: [name], female    DOB: [birth year], 53 y.o.   MRN: [id number]  HPI 53 year old patient with recent dental work of 16 teeth extraction presents with acute left sided throat pain x 3 days.  Pain is when she swallows and is also made worse with talking and at night.  Feels tired and has headaches.  Decreased appetite and is eating less due to pain; no pain with liquids.  Denies ear fullness, change in her chronic cough, SOB or chest pains.  She has tried gargling salt water and has taken oxycodone for relief.  She has been around her granddaughter who has strep throat.  Patient requesting flu shot today.  She is still smoking, but thinking about cutting back again.     Review of Systems  Constitutional: Positive for appetite change and fatigue. Negative for fever, chills and activity change.  HENT: Positive for sore throat. Negative for congestion, ear discharge, ear pain, facial swelling, postnasal drip, rhinorrhea, sinus pressure and sneezing.        Pain is localized to the left side.  Feeling pressure between the eyes.  Eyes: Negative for pain and visual disturbance.  Respiratory: Positive for cough. Negative for choking, chest tightness, shortness of breath and wheezing.        Cough is chronic from smoking and is non-productive  Cardiovascular: Negative for chest pain.  Gastrointestinal: Negative for nausea, vomiting and abdominal pain.  Neurological: Positive for headaches. Negative for dizziness, syncope, weakness and light-headedness.       Objective:   Physical Exam  Nursing note and vitals reviewed. Constitutional: She is oriented to person, place, and time. She appears well-developed and well-nourished.  Female holding her throat where the pain is upon entering the room.  HENT:  Head: Normocephalic and atraumatic.  Right Ear: External ear normal.  Left Ear: External ear normal.  Nose: Nose normal.  Mouth/Throat: Oropharynx is clear and  moist. No oropharyngeal exudate.  No uvula deviation.   Eyes: Conjunctivae and EOM are normal. Pupils are equal, round, and reactive to light.  Neck: Normal range of motion. Neck supple. No tracheal deviation present. No thyromegaly present.  Point tenderness over the left neck, approximately 1 in below the mandible.  No associated lymphadenopathy.  Cardiovascular: Normal rate, regular rhythm and normal heart sounds.   No murmur heard. Pulmonary/Chest: Effort normal and breath sounds normal. No respiratory distress. She has no wheezes.  Lymphadenopathy:    She has no cervical adenopathy.  Neurological: She is alert and oriented to person, place, and time.  Skin: She is not diaphoretic.  Psychiatric: She has a normal mood and affect. Her behavior is normal. Judgment and thought content normal.          Assessment & Plan:  Pharyngitis- Afebrile.  Centor criteria not met; rapid strep screen negative.  Likely viral pharyngitis.  Continue with symptomatic relief.  It should begin to resolve.     Joseph Art, PA-S  Agree with above.  Treat symptomatically.  Bruce Burchette M.D.

## 2014-08-06 NOTE — Telephone Encounter (Signed)
Pt aware that Rx is ready for pick up 

## 2014-08-11 ENCOUNTER — Encounter: Payer: Self-pay | Admitting: Family Medicine

## 2014-09-01 ENCOUNTER — Telehealth: Payer: Self-pay | Admitting: Family Medicine

## 2014-09-01 NOTE — Telephone Encounter (Signed)
Last visit 08/06/14 Last refill 08/06/14 #90 0 refill

## 2014-09-01 NOTE — Telephone Encounter (Signed)
Patient would like to know if Dr. Elease Hashimoto will approve her rx for oxyCODONE (ROXICODONE) 15 MG immediate release tablet on Wednesday since will are closed Thursday and Friday??

## 2014-09-01 NOTE — Telephone Encounter (Signed)
We can refill on Wednesday, but this script must last until Dec 28th (not Dec 25)

## 2014-09-03 MED ORDER — OXYCODONE HCL 15 MG PO TABS: 15.0000 mg | ORAL_TABLET | Freq: Three times a day (TID) | ORAL | Status: DC | PRN

## 2014-09-03 NOTE — Telephone Encounter (Signed)
Pt informed Rx is ready for pick up.

## 2014-09-29 ENCOUNTER — Other Ambulatory Visit: Payer: Self-pay | Admitting: Family Medicine

## 2014-09-29 NOTE — Telephone Encounter (Signed)
Last visit 08/06/14 Last refill 06/30/14 #75 1refill

## 2014-09-29 NOTE — Telephone Encounter (Signed)
Refill once.  i know she has been on this dose for a long time but would like to discuss with her at follow up trying to slowly taper back dose.

## 2014-10-02 ENCOUNTER — Ambulatory Visit (INDEPENDENT_AMBULATORY_CARE_PROVIDER_SITE_OTHER): Payer: No Typology Code available for payment source | Admitting: Family Medicine

## 2014-10-02 ENCOUNTER — Encounter: Payer: Self-pay | Admitting: Family Medicine

## 2014-10-02 VITALS — BP 130/80 | HR 94 | Temp 97.8°F | Wt 155.0 lb

## 2014-10-02 DIAGNOSIS — D229 Melanocytic nevi, unspecified: Secondary | ICD-10-CM

## 2014-10-02 DIAGNOSIS — M545 Low back pain, unspecified: Secondary | ICD-10-CM

## 2014-10-02 DIAGNOSIS — G8929 Other chronic pain: Secondary | ICD-10-CM

## 2014-10-02 MED ORDER — OXYCODONE HCL 15 MG PO TABS: 15.0000 mg | ORAL_TABLET | Freq: Three times a day (TID) | ORAL | Status: DC | PRN

## 2014-10-02 NOTE — Progress Notes (Signed)
Pre visit review using our clinic review tool, if applicable. No additional management support is needed unless otherwise documented below in the visit note. 

## 2014-10-02 NOTE — Patient Instructions (Signed)
Moles Moles are usually harmless growths on the skin. They are accumulations of color (pigment) cells in the skin that:   Can be various colors, from light brown to black.  Can appear anywhere on the body.  May remain flat or become raised.  May contain hairs.  May remain smooth or develop wrinkling. Most moles are not cancerous (benign). However, some moles may develop changes and become cancerous. It is important to check your moles every month. If you check your moles regularly, you will be able to notice any changes that may occur.  CAUSES  Moles occur when skin cells grow together in clusters instead of spreading out in the skin as they normally do. The reason for this clustering is unknown. DIAGNOSIS  Your caregiver will perform a skin examination to diagnose your mole.  TREATMENT  Moles usually do not require treatment. If a mole becomes worrisome, your caregiver may choose to take a sample of the mole or remove it entirely, and then send it to a lab for examination.  HOME CARE INSTRUCTIONS  Check your mole(s) monthly for changes that may indicate skin cancer. These changes can include:  A change in size.  A change in color. Note that moles tend to darken during pregnancy or when taking birth control pills (oral contraception).  A change in shape.  A change in the border of the mole.  Wear sunscreen (with an SPF of at least 30) when you spend long periods of time outside. Reapply the sunscreen every 2-3 hours.  Schedule annual appointments with your skin doctor (dermatologist) if you have a large number of moles. SEEK MEDICAL CARE IF:  Your mole changes size, especially if it becomes larger than a pencil eraser.  Your mole changes in color or develops more than one color.  Your mole becomes itchy or bleeds.  Your mole, or the skin near the mole, becomes painful, sore, red, or swollen.  Your mole becomes scaly, sheds skin, or oozes fluid.  Your mole develops  irregular borders.  Your mole becomes flat or develops raised areas.  Your mole becomes hard or soft. Document Released: 06/21/2001 Document Revised: 06/20/2012 Document Reviewed: 04/09/2012 Exit[employer] Patient Information 2015 [employer]. This information is not intended to replace advice given to you by your health care provider. Make sure you discuss any questions you have with your health care provider.  

## 2014-10-02 NOTE — Progress Notes (Signed)
   Subjective:    Patient ID: [name], female    DOB: 1961-05-30, 53 y.o.   MRN: [id number]  HPI  Patient seen for the following issues  She recently noticed mole under her left breast. This does not itch and no bleeding or irritation. No history of skin cancers. She is not aware of any change in growth or other change of characteristics.  Chronic back pain. She has recently had some intermittent numbness right lower extremity. She's been maintained on chronic opioids for many years currently oxycodone 15 mg 3 times daily and pain still poorly controlled at times. She has intermittent low back pain bilaterally. She's not having any consistent radiculopathy symptoms.  She had MRI scan back in May 2014 which showed degenerative changes. Denies any recent weakness. No loss of bladder or bowel control. She has previously taken gabapentin but states this made her dizzy and had similar side effects with Lyrica. Her pain has never been controlled with over-the-counter medications. She has previously tried tramadol without relief.  Past Medical History  Diagnosis Date  . PANIC DISORDER 04/18/2007  . TOBACCO ABUSE 07/17/2009  . CARPAL TUNNEL SYNDROME, BILATERAL 01/16/2008  . ASTHMA UNSPECIFIED WITH EXACERBATION 07/21/2010  . COPD 05/08/2008  . GERD 04/18/2007  . HELICOBACTER PYLORI GASTRITIS, HX OF 04/18/2007   Past Surgical History  Procedure Laterality Date  . Appendectomy    . Oophorectomy      reports that she has been smoking Cigarettes.  She has a 50 pack-year smoking history. She has never used smokeless tobacco. She reports that she does not drink alcohol or use illicit drugs. family history includes Breast cancer in her paternal aunt; Cancer in her father; Clotting disorder in her brother; Diabetes in her mother; Heart disease in her father; Stomach cancer in her paternal uncle. Allergies  Allergen Reactions  . Aspirin     REACTION: had h. pylori due to too many goody powders. told not to  take any more asa     Review of Systems  Constitutional: Negative for fever, chills, appetite change and unexpected weight change.  Respiratory: Negative for shortness of breath.   Gastrointestinal: Negative for abdominal pain.  Genitourinary: Negative for dysuria.  Musculoskeletal: Positive for back pain.  Neurological: Positive for numbness. Negative for weakness.       Objective:   Physical Exam  Constitutional: She appears well-developed and well-nourished.  Cardiovascular: Normal rate and regular rhythm.   Pulmonary/Chest: Effort normal and breath sounds normal. No respiratory distress. She has no wheezes. She has no rales.  Musculoskeletal:  Straight leg raises are negative bilaterally. No peripheral edema  Neurological:  Symmetric reflexes knee and ankle bilaterally. Full-strength right lower extremity  Skin:  Patient has brownish colored nevus underneath left breast. This is well demarcated and symmetric and homogenous brown color with smooth border and no irregular features. Approximately 6 mm diameter          Assessment & Plan:  #1 benign appearing nevus as above. Reassurance #2 chronic low back pain. History of degenerative disc disease in her lower lumbar spine. She is recently describing some intermittent numbness right lower extremity and poorly controlled pain. She has nonfocal neurologic exam currently. Set up referral to orthopedic back specialist

## 2014-10-04 ENCOUNTER — Other Ambulatory Visit: Payer: Self-pay | Admitting: Family Medicine

## 2014-10-28 ENCOUNTER — Other Ambulatory Visit: Payer: Self-pay | Admitting: Family Medicine

## 2014-10-28 NOTE — Telephone Encounter (Signed)
Rx last filled 12.22.2015 #75 with 0 rf.  Pt last seen 12.22.2015. Pls advise.

## 2014-10-28 NOTE — Telephone Encounter (Signed)
May refill by the 21st with 2 additional refills.

## 2014-11-03 ENCOUNTER — Encounter: Payer: Self-pay | Admitting: Family Medicine

## 2014-11-03 ENCOUNTER — Ambulatory Visit (INDEPENDENT_AMBULATORY_CARE_PROVIDER_SITE_OTHER): Payer: No Typology Code available for payment source | Admitting: Family Medicine

## 2014-11-03 VITALS — BP 128/76 | HR 92 | Temp 97.7°F | Wt 155.0 lb

## 2014-11-03 DIAGNOSIS — M545 Low back pain, unspecified: Secondary | ICD-10-CM

## 2014-11-03 DIAGNOSIS — G8929 Other chronic pain: Secondary | ICD-10-CM

## 2014-11-03 MED ORDER — OXYCODONE HCL 15 MG PO TABS: 15.0000 mg | ORAL_TABLET | Freq: Three times a day (TID) | ORAL | Status: DC | PRN

## 2014-11-03 NOTE — Progress Notes (Signed)
Pre visit review using our clinic review tool, if applicable. No additional management support is needed unless otherwise documented below in the visit note. 

## 2014-11-03 NOTE — Progress Notes (Signed)
   Subjective:    Patient ID: [name], female    DOB: [birth year], 54 y.o.   MRN: [id number]  HPI Patient here to distress her chronic low back pain. We recently referred her to back specialist, Dr. Nelva Bush.  He apparently obtained some plain films which showed degenerative changes. He suggested physical therapy and she states this will be difficult because of her current work schedule. She's had several years of chronic back pain. She has tried over-the-counter medications such as Tylenol and nonsteroidals without relief. She had tramadol but had stomachaches. She was treated with Cymbalta and gabapentin without improvement. She has been on chronic opioids for many years and came to me already taking those medications. She's not had any history of known misuse.  Past Medical History  Diagnosis Date  . PANIC DISORDER 04/18/2007  . TOBACCO ABUSE 07/17/2009  . CARPAL TUNNEL SYNDROME, BILATERAL 01/16/2008  . ASTHMA UNSPECIFIED WITH EXACERBATION 07/21/2010  . COPD 05/08/2008  . GERD 04/18/2007  . HELICOBACTER PYLORI GASTRITIS, HX OF 04/18/2007   Past Surgical History  Procedure Laterality Date  . Appendectomy    . Oophorectomy      reports that she has been smoking Cigarettes.  She has a 50 pack-year smoking history. She has never used smokeless tobacco. She reports that she does not drink alcohol or use illicit drugs. family history includes Breast cancer in her paternal aunt; Cancer in her father; Clotting disorder in her brother; Diabetes in her mother; Heart disease in her father; Stomach cancer in her paternal uncle. Allergies  Allergen Reactions  . Aspirin     REACTION: had h. pylori due to too many goody powders. told not to take any more asa      Review of Systems  Constitutional: Negative for fever and chills.  Musculoskeletal: Positive for back pain.  Neurological: Negative for weakness and numbness.       Objective:   Physical Exam  Constitutional: She appears well-developed  and well-nourished.  Cardiovascular: Normal rate and regular rhythm.   Pulmonary/Chest: Effort normal and breath sounds normal. No respiratory distress. She has no wheezes. She has no rales.  Musculoskeletal: She exhibits no edema.          Assessment & Plan:  Chronic back pain. We have encouraged her to proceed with physical therapy as recommended by orthopedics. She has failed multiple conservative therapies as above.  Wrote rx for Oxycodone.  We have recommended referral to pain management if pain not adequately managed/controlled with above.

## 2014-11-05 ENCOUNTER — Ambulatory Visit: Payer: No Typology Code available for payment source | Admitting: Family Medicine

## 2014-11-11 ENCOUNTER — Other Ambulatory Visit: Payer: Self-pay | Admitting: Family Medicine

## 2014-12-01 ENCOUNTER — Telehealth: Payer: Self-pay | Admitting: Family Medicine

## 2014-12-01 NOTE — Telephone Encounter (Signed)
Needs to pick up her refill for her Rx for Oxycodone 15mg  on Wednesday if possible. She works a 12 hour shift Thursday and Friday.

## 2014-12-01 NOTE — Telephone Encounter (Signed)
May refill by the 24 th

## 2014-12-01 NOTE — Telephone Encounter (Signed)
Last visit 11/03/14 Last refill 11/03/14 #90 0 refill

## 2014-12-02 NOTE — Telephone Encounter (Signed)
Patient called to check status of re-fill.  She is aware that it can be filled on 12/03/14.

## 2014-12-03 MED ORDER — OXYCODONE HCL 15 MG PO TABS: 15.0000 mg | ORAL_TABLET | Freq: Three times a day (TID) | ORAL | Status: DC | PRN

## 2014-12-03 NOTE — Telephone Encounter (Signed)
rx is ready for pick up 

## 2014-12-29 ENCOUNTER — Ambulatory Visit (INDEPENDENT_AMBULATORY_CARE_PROVIDER_SITE_OTHER): Payer: No Typology Code available for payment source | Admitting: Family Medicine

## 2014-12-29 ENCOUNTER — Encounter: Payer: Self-pay | Admitting: Family Medicine

## 2014-12-29 VITALS — BP 130/70 | HR 88 | Temp 97.9°F | Wt 160.0 lb

## 2014-12-29 DIAGNOSIS — R062 Wheezing: Secondary | ICD-10-CM

## 2014-12-29 DIAGNOSIS — J441 Chronic obstructive pulmonary disease with (acute) exacerbation: Secondary | ICD-10-CM

## 2014-12-29 MED ORDER — ALBUTEROL SULFATE 108 (90 BASE) MCG/ACT IN AEPB: 2.0000 | INHALATION_SPRAY | RESPIRATORY_TRACT | Status: DC | PRN

## 2014-12-29 MED ORDER — CEFUROXIME AXETIL 250 MG PO TABS: 250.0000 mg | ORAL_TABLET | Freq: Two times a day (BID) | ORAL | Status: DC

## 2014-12-29 MED ORDER — OXYCODONE HCL 15 MG PO TABS: 15.0000 mg | ORAL_TABLET | Freq: Three times a day (TID) | ORAL | Status: DC | PRN

## 2014-12-29 MED ORDER — METHYLPREDNISOLONE ACETATE 80 MG/ML IJ SUSP
80.0000 mg | Freq: Once | INTRAMUSCULAR | Status: AC
  Administered 2014-12-29: 80 mg via INTRAMUSCULAR

## 2014-12-29 NOTE — Progress Notes (Signed)
Pre visit review using our clinic review tool, if applicable. No additional management support is needed unless otherwise documented below in the visit note. 

## 2014-12-29 NOTE — Progress Notes (Signed)
   Subjective:    Patient ID: [name], female    DOB: [birth year], 54 y.o.   MRN: [id number]  HPI Patient is seen with cough. This started about 2 weeks ago. Initially clear now productive. She has long history smoking. She is trying to scale back.  Tessalon Perles without much relief. She has some wheezing off and on. Intolerant of oral prednisone the past but has tolerated injection. She's had some sinus pressure headaches off and on. Temperature up to 100.1 yesterday. No dyspnea.  Past Medical History  Diagnosis Date  . PANIC DISORDER 04/18/2007  . TOBACCO ABUSE 07/17/2009  . CARPAL TUNNEL SYNDROME, BILATERAL 01/16/2008  . ASTHMA UNSPECIFIED WITH EXACERBATION 07/21/2010  . COPD 05/08/2008  . GERD 04/18/2007  . HELICOBACTER PYLORI GASTRITIS, HX OF 04/18/2007   Past Surgical History  Procedure Laterality Date  . Appendectomy    . Oophorectomy      reports that she has been smoking Cigarettes.  She has a 50 pack-year smoking history. She has never used smokeless tobacco. She reports that she does not drink alcohol or use illicit drugs. family history includes Breast cancer in her paternal aunt; Cancer in her father; Clotting disorder in her brother; Diabetes in her mother; Heart disease in her father; Stomach cancer in her paternal uncle. Allergies  Allergen Reactions  . Aspirin     REACTION: had h. pylori due to too many goody powders. told not to take any more asa      Review of Systems  Constitutional: Negative for chills.  HENT: Positive for congestion. Negative for sore throat.   Respiratory: Positive for cough and wheezing.   Cardiovascular: Negative for chest pain.       Objective:   Physical Exam  Constitutional: She appears well-developed and well-nourished.  HENT:  Right Ear: External ear normal.  Left Ear: External ear normal.  Mouth/Throat: Oropharynx is clear and moist.  Cardiovascular: Normal rate and regular rhythm.   Pulmonary/Chest: Effort normal. She has  wheezes. She has no rales.  Diffuse wheezes. Normal respiratory rate. No retractions.  Musculoskeletal: She exhibits no edema.          Assessment & Plan:  Acute exacerbation of COPD. Continue Spiriva. Prescription for pro-air respiclick 2 puffs every 4 hours as needed. Depo-Medrol 80 mg IM given. Ceftin 250 mg twice a day for 7 days. She is strongly encouraged to quit smoking altogether

## 2015-01-29 ENCOUNTER — Ambulatory Visit (INDEPENDENT_AMBULATORY_CARE_PROVIDER_SITE_OTHER): Payer: No Typology Code available for payment source | Admitting: Family Medicine

## 2015-01-29 ENCOUNTER — Encounter: Payer: Self-pay | Admitting: Family Medicine

## 2015-01-29 VITALS — BP 130/74 | HR 87 | Temp 97.9°F | Wt 158.0 lb

## 2015-01-29 DIAGNOSIS — M545 Low back pain, unspecified: Secondary | ICD-10-CM

## 2015-01-29 DIAGNOSIS — G8929 Other chronic pain: Secondary | ICD-10-CM

## 2015-01-29 DIAGNOSIS — F410 Panic disorder [episodic paroxysmal anxiety] without agoraphobia: Secondary | ICD-10-CM | POA: Diagnosis not present

## 2015-01-29 MED ORDER — OXYCODONE HCL 15 MG PO TABS: 15.0000 mg | ORAL_TABLET | Freq: Three times a day (TID) | ORAL | Status: DC | PRN

## 2015-01-29 MED ORDER — ALPRAZOLAM 2 MG PO TABS: ORAL_TABLET | ORAL | Status: DC

## 2015-01-29 NOTE — Progress Notes (Signed)
   Subjective:    Patient ID: [name], female    DOB: 14-Feb-1961, 54 y.o.   MRN: [id number]  HPI Patient seen to discuss her chronic back pain. She has seen specialists in the past and had MRI May 2014 which showed some degenerative changes but no specific nerve impingement. She went to Dr. Nelva Bush who recommended physical therapy and she apparently never went. She's been maintained for many years prior to seeing me on chronic opioids and is currently on oxycodone 15 mg 3 times a day. States her pain is not well-controlled 24 hours even with this dosage. We have recommended several times considering chronic pain management. She's tried multiple other medications such as gabapentin, Cymbalta, Tri-Cyclen and had intolerance. She has had no recent constipation issues.  She's been maintained long-term on Xanax for anxiety. She has tried SSRI medications but had problems with tolerance.  Husband recently diagnosed with advanced cirrhosis and she is dealing with stressors of taking care of him.  Past Medical History  Diagnosis Date  . PANIC DISORDER 04/18/2007  . TOBACCO ABUSE 07/17/2009  . CARPAL TUNNEL SYNDROME, BILATERAL 01/16/2008  . ASTHMA UNSPECIFIED WITH EXACERBATION 07/21/2010  . COPD 05/08/2008  . GERD 04/18/2007  . HELICOBACTER PYLORI GASTRITIS, HX OF 04/18/2007   Past Surgical History  Procedure Laterality Date  . Appendectomy    . Oophorectomy      reports that she has been smoking Cigarettes.  She has a 50 pack-year smoking history. She has never used smokeless tobacco. She reports that she does not drink alcohol or use illicit drugs. family history includes Breast cancer in her paternal aunt; Cancer in her father; Clotting disorder in her brother; Diabetes in her mother; Heart disease in her father; Stomach cancer in her paternal uncle. Allergies  Allergen Reactions  . Aspirin     REACTION: had h. pylori due to too many goody powders. told not to take any more asa      Review of  Systems  Eyes: Negative for visual disturbance.  Respiratory: Positive for shortness of breath. Negative for cough, chest tightness and wheezing.   Cardiovascular: Negative for chest pain, palpitations and leg swelling.  Musculoskeletal: Positive for back pain.  Neurological: Negative for dizziness, seizures, syncope, weakness, light-headedness and headaches.       Objective:   Physical Exam  Constitutional: She appears well-developed and well-nourished.  Cardiovascular: Normal rate and regular rhythm.  Exam reveals no gallop.   Pulmonary/Chest: Effort normal and breath sounds normal. No respiratory distress. She has no wheezes. She has no rales.  Musculoskeletal: She exhibits no edema.  Straight leg raise are negative  Neurological:  Symmetric lower extremity reflexes. No focal strength deficits.          Assessment & Plan:  #1 chronic back pain. We have strongly advocated chronic pain management. We have declined raising her pain medication she is on fairly high-dose opioids currently and states she has poor pain control. We've previously explored many other medication options above but she's had intolerance to many other medications. #2 chronic anxiety. Refill alprazolam

## 2015-01-29 NOTE — Progress Notes (Signed)
Pre visit review using our clinic review tool, if applicable. No additional management support is needed unless otherwise documented below in the visit note. 

## 2015-03-02 ENCOUNTER — Ambulatory Visit (INDEPENDENT_AMBULATORY_CARE_PROVIDER_SITE_OTHER): Payer: No Typology Code available for payment source | Admitting: Family Medicine

## 2015-03-02 ENCOUNTER — Encounter: Payer: Self-pay | Admitting: Family Medicine

## 2015-03-02 VITALS — BP 126/80 | HR 92 | Temp 97.8°F | Wt 158.0 lb

## 2015-03-02 DIAGNOSIS — R059 Cough, unspecified: Secondary | ICD-10-CM

## 2015-03-02 DIAGNOSIS — R05 Cough: Secondary | ICD-10-CM

## 2015-03-02 DIAGNOSIS — M545 Low back pain, unspecified: Secondary | ICD-10-CM

## 2015-03-02 DIAGNOSIS — G8929 Other chronic pain: Secondary | ICD-10-CM | POA: Diagnosis not present

## 2015-03-02 DIAGNOSIS — E785 Hyperlipidemia, unspecified: Secondary | ICD-10-CM

## 2015-03-02 LAB — HEPATIC FUNCTION PANEL
ALBUMIN: 4.3 g/dL (ref 3.5–5.2)
ALT: 19 U/L (ref 0–35)
AST: 21 U/L (ref 0–37)
Alkaline Phosphatase: 85 U/L (ref 39–117)
BILIRUBIN DIRECT: 0.1 mg/dL (ref 0.0–0.3)
BILIRUBIN TOTAL: 0.4 mg/dL (ref 0.2–1.2)
Total Protein: 7.8 g/dL (ref 6.0–8.3)

## 2015-03-02 LAB — BASIC METABOLIC PANEL
BUN: 15 mg/dL (ref 6–23)
CO2: 31 mEq/L (ref 19–32)
Calcium: 9.6 mg/dL (ref 8.4–10.5)
Chloride: 97 mEq/L (ref 96–112)
Creatinine, Ser: 0.74 mg/dL (ref 0.40–1.20)
GFR: 86.38 mL/min (ref >60.00)
Glucose, Bld: 97 mg/dL (ref 70–99)
POTASSIUM: 3.9 meq/L (ref 3.5–5.1)
SODIUM: 133 meq/L — AB (ref 135–145)

## 2015-03-02 LAB — LDL CHOLESTEROL, DIRECT: Direct LDL: 142 mg/dL

## 2015-03-02 LAB — LIPID PANEL
Cholesterol: 330 mg/dL — ABNORMAL HIGH (ref 0–200)
HDL: 54.5 mg/dL (ref >39.00)
Total CHOL/HDL Ratio: 6

## 2015-03-02 MED ORDER — BENZONATATE 200 MG PO CAPS: 200.0000 mg | ORAL_CAPSULE | Freq: Three times a day (TID) | ORAL | Status: DC | PRN

## 2015-03-02 MED ORDER — OXYCODONE HCL 15 MG PO TABS: 15.0000 mg | ORAL_TABLET | Freq: Three times a day (TID) | ORAL | Status: DC | PRN

## 2015-03-02 NOTE — Progress Notes (Signed)
Pre visit review using our clinic review tool, if applicable. No additional management support is needed unless otherwise documented below in the visit note. 

## 2015-03-02 NOTE — Patient Instructions (Signed)
Please call me within 2 weeks if cough no better.

## 2015-03-02 NOTE — Progress Notes (Signed)
Subjective:    Patient ID: [name], female    DOB: [birth year], 54 y.o.   MRN: [id number]  HPI Patient seen for the following issues  Chronic low back pain. Refer to prior note:   "Patient seen to discuss her chronic back pain. She has seen specialists in the past and had MRI May 2014 which showed some degenerative changes but no specific nerve impingement. She went to Dr. Nelva Bush who recommended physical therapy and she apparently never went. She's been maintained for many years prior to seeing me on chronic opioids and is currently on oxycodone 15 mg 3 times a day. States her pain is not well-controlled 24 hours even with this dosage. We have recommended several times considering chronic pain management. She's tried multiple other medications such as gabapentin, Cymbalta, Tri-Cyclen and had intolerance. She has had no recent constipation issues."  She is now willing to consider referral pain management. She has checked with her insurance company for coverage.  Cough for the past 4 weeks. Nonproductive. Still smoking. History of COPD. No dyspnea. No hemoptysis. No appetite or major weight changes. Not using Spiriva consistently. Uses albuterol as needed. Requesting Tessalon Perles which helped in the past  Hyperlipidemia on Mevacor. No history of CAD. Needs follow-up labs  Past Medical History  Diagnosis Date  . PANIC DISORDER 04/18/2007  . TOBACCO ABUSE 07/17/2009  . CARPAL TUNNEL SYNDROME, BILATERAL 01/16/2008  . ASTHMA UNSPECIFIED WITH EXACERBATION 07/21/2010  . COPD 05/08/2008  . GERD 04/18/2007  . HELICOBACTER PYLORI GASTRITIS, HX OF 04/18/2007   Past Surgical History  Procedure Laterality Date  . Appendectomy    . Oophorectomy      reports that she has been smoking Cigarettes.  She has a 50 pack-year smoking history. She has never used smokeless tobacco. She reports that she does not drink alcohol or use illicit drugs. family history includes Breast cancer in her paternal aunt;  Cancer in her father; Clotting disorder in her brother; Diabetes in her mother; Heart disease in her father; Stomach cancer in her paternal uncle. Allergies  Allergen Reactions  . Aspirin     REACTION: had h. pylori due to too many goody powders. told not to take any more asa      Review of Systems  Constitutional: Negative for fatigue.  Eyes: Negative for visual disturbance.  Respiratory: Positive for cough. Negative for chest tightness, shortness of breath and wheezing.   Cardiovascular: Negative for chest pain, palpitations and leg swelling.  Gastrointestinal: Negative for abdominal pain.  Endocrine: Negative for polydipsia and polyuria.  Genitourinary: Negative for dysuria.  Musculoskeletal: Positive for back pain.  Neurological: Negative for dizziness, seizures, syncope, weakness, light-headedness and headaches.       Objective:   Physical Exam  Constitutional: She appears well-developed and well-nourished.  Neck: Neck supple.  Cardiovascular: Normal rate and regular rhythm.  Exam reveals no gallop.   Pulmonary/Chest: Effort normal and breath sounds normal. No respiratory distress. She has no wheezes. She has no rales.  Musculoskeletal: She exhibits no edema.  Lymphadenopathy:    She has no cervical adenopathy.          Assessment & Plan:  #1 cough. Nonfocal exam. Tessalon Perles 200 mg every 8 hours as needed. Chest x-ray in 2 weeks if cough not resolving #2 chronic back pain. Referral to chronic pain management. She's been on multiple conservative treatments the past without success. She is no oxycodone for several years but still has poor control #3 hyperlipidemia.  Check lipid and hepatic panel. Continue Mevacor

## 2015-03-04 ENCOUNTER — Other Ambulatory Visit: Payer: Self-pay

## 2015-03-04 ENCOUNTER — Other Ambulatory Visit: Payer: Self-pay | Admitting: Family Medicine

## 2015-03-04 DIAGNOSIS — E785 Hyperlipidemia, unspecified: Secondary | ICD-10-CM

## 2015-03-04 MED ORDER — ATORVASTATIN CALCIUM 40 MG PO TABS: 40.0000 mg | ORAL_TABLET | Freq: Every day | ORAL | Status: DC

## 2015-03-12 ENCOUNTER — Telehealth: Payer: Self-pay | Admitting: *Deleted

## 2015-03-12 NOTE — Telephone Encounter (Signed)
My Chart message sent about Mammogram.

## 2015-03-17 ENCOUNTER — Emergency Department (HOSPITAL_COMMUNITY)
Admission: EM | Admit: 2015-03-17 | Discharge: 2015-03-17 | Discharge status: Against Medical Advice | Payer: No Typology Code available for payment source

## 2015-03-17 ENCOUNTER — Telehealth: Payer: Self-pay | Admitting: Family Medicine

## 2015-03-17 NOTE — Telephone Encounter (Signed)
Pt states she would like to know if dr Elease Hashimoto will send an order for her to go to [facility] for the chest xray. Pt states Dr told her he would if she kept coughing

## 2015-03-17 NOTE — Telephone Encounter (Signed)
Called cell phone and home phone. No answer left messages on both Vm's for patient to return call.

## 2015-03-17 NOTE — Telephone Encounter (Signed)
Left message on patient Vm. That Dr. B is out of the office till 6/8. And i will send message to Dr. Jacinto Reap.

## 2015-03-17 NOTE — Telephone Encounter (Signed)
Patient Name: [name]  DOB: [birth year]    Initial Comment Caller states was seen on the 23rd, was told to call if she is still coughing and she is.    Nurse Assessment  Nurse: Thad Ranger RN, Denise Date/Time (Eastern Time): 03/17/2015 3:14:05 PM  Confirm and document reason for call. If symptomatic, describe symptoms. ---Caller states was seen on the 23rd for a cough and was told to cb if it got worse. States she has some mild SOB.  Has the patient traveled out of the country within the last 30 days? ---Not Applicable  Does the patient require triage? ---Yes  Related visit to physician within the last 2 weeks? ---Yes  Does the PT have any chronic conditions? (i.e. diabetes, asthma, etc.) ---Yes  List chronic conditions. ---Smoker. COPD     Guidelines    Guideline Title Affirmed Question Affirmed Notes  Cough - Acute Non-Productive Difficulty breathing    Final Disposition User   Go to ED Now [place], RN, [employer]

## 2015-03-17 NOTE — Telephone Encounter (Signed)
Pt presented to ER for further evaluation.

## 2015-03-18 ENCOUNTER — Ambulatory Visit (INDEPENDENT_AMBULATORY_CARE_PROVIDER_SITE_OTHER)
Admission: RE | Admit: 2015-03-18 | Discharge: 2015-03-18 | Disposition: A | Discharge status: Home / Self Care | Payer: No Typology Code available for payment source | Source: Ambulatory Visit | Attending: Family Medicine | Admitting: Family Medicine

## 2015-03-18 ENCOUNTER — Other Ambulatory Visit: Payer: Self-pay | Admitting: Family Medicine

## 2015-03-18 DIAGNOSIS — R059 Cough, unspecified: Secondary | ICD-10-CM

## 2015-03-18 DIAGNOSIS — R05 Cough: Secondary | ICD-10-CM | POA: Diagnosis not present

## 2015-03-18 NOTE — Telephone Encounter (Signed)
Pt called and stated that she left the ED before even been treated. Pt would like a chest xray. Pt states that she is feeling okay, she just still has that cough and she only cough when laying down.

## 2015-03-18 NOTE — Telephone Encounter (Signed)
Per Dr. B it is okay for patient to get chest xray. Xray ordered. Pt is aware that xray is ordered.

## 2015-04-01 ENCOUNTER — Encounter: Payer: Self-pay | Admitting: Family Medicine

## 2015-04-01 ENCOUNTER — Ambulatory Visit (INDEPENDENT_AMBULATORY_CARE_PROVIDER_SITE_OTHER): Payer: No Typology Code available for payment source | Admitting: Family Medicine

## 2015-04-01 ENCOUNTER — Telehealth: Payer: Self-pay | Admitting: Family Medicine

## 2015-04-01 VITALS — BP 120/80 | HR 95 | Temp 97.6°F | Wt 154.0 lb

## 2015-04-01 DIAGNOSIS — M545 Low back pain, unspecified: Secondary | ICD-10-CM

## 2015-04-01 DIAGNOSIS — G8929 Other chronic pain: Secondary | ICD-10-CM | POA: Diagnosis not present

## 2015-04-01 NOTE — Progress Notes (Signed)
Pre visit review using our clinic review tool, if applicable. No additional management support is needed unless otherwise documented below in the visit note. 

## 2015-04-01 NOTE — Progress Notes (Signed)
   Subjective:    Patient ID: [name], female    DOB: 1961-10-03, 54 y.o.   MRN: [id number]  HPI Patient history of chronic back pain for many years. She was complaining of poor control even with higher dose oxycodone we had referred her pain management. She was seen.for further pain management recently but states that she cannot afford to go back. Apparently between the co-pay and cost of drug screening there she states she cannot afford that. They also apparently proposed doing some type of injection in her back which her insurance was not going to cover. She was prescribed oxycodone 7.5 mg -180 tablets and is basically here to request that we continue with her pain management  Past Medical History  Diagnosis Date  . PANIC DISORDER 04/18/2007  . TOBACCO ABUSE 07/17/2009  . CARPAL TUNNEL SYNDROME, BILATERAL 01/16/2008  . ASTHMA UNSPECIFIED WITH EXACERBATION 07/21/2010  . COPD 05/08/2008  . GERD 04/18/2007  . HELICOBACTER PYLORI GASTRITIS, HX OF 04/18/2007   Past Surgical History  Procedure Laterality Date  . Appendectomy    . Oophorectomy      reports that she has been smoking Cigarettes.  She has a 50 pack-year smoking history. She has never used smokeless tobacco. She reports that she does not drink alcohol or use illicit drugs. family history includes Breast cancer in her paternal aunt; Cancer in her father; Clotting disorder in her brother; Diabetes in her mother; Heart disease in her father; Stomach cancer in her paternal uncle. Allergies  Allergen Reactions  . Aspirin     REACTION: had h. pylori due to too many goody powders. told not to take any more asa      Review of Systems  Constitutional: Positive for fatigue. Negative for fever and chills.  Musculoskeletal: Positive for back pain.       Objective:   Physical Exam  Constitutional: She appears well-developed and well-nourished.  Cardiovascular: Normal rate and regular rhythm.   Pulmonary/Chest: Effort normal and  breath sounds normal. No respiratory distress. She has no wheezes. She has no rales.  Musculoskeletal: She exhibits no edema.          Assessment & Plan:  Chronic back pain. We explained that we would not be able to prescribe any kind of controlled pain medication unless there is a clear transfer of care from their clinic to ours.

## 2015-04-01 NOTE — Telephone Encounter (Signed)
Pt would like a cb concerning her pain management clinic and some issues she is having

## 2015-04-02 NOTE — Telephone Encounter (Signed)
i advise her to follow up with the pain clinic.  I really think this is in her best interest since her pain has been so difficult to control.

## 2015-04-02 NOTE — Telephone Encounter (Signed)
Left message for patient to return call.

## 2015-04-02 NOTE — Telephone Encounter (Signed)
The clinic coordinator states that they will not be able to do a letter for the patient, because the patient is self discharging herself.   Spoke with patient, patient feels like she is starting to have withdraws from the medication.

## 2015-04-03 NOTE — Telephone Encounter (Signed)
Pt informed Face to Face. Pt informed per Dr. Jacinto Reap note. Pt verbally understands. Pt states that she did self-dismiss herself from the pain clinic and that she will try to go back to the pain clinic to see if they will take her back. Informed patient to let us know the information if the clinic will accept her back at the pain clinic.

## 2015-04-06 ENCOUNTER — Telehealth: Payer: Self-pay | Admitting: Family Medicine

## 2015-04-06 NOTE — Telephone Encounter (Signed)
Pt needs another referral to pain management. Pt dismissed herself from last pain management clinic and they will not take her back. Pt is aware md out of office this week

## 2015-04-08 NOTE — Telephone Encounter (Signed)
Pt has a appointment on 04/16/15 to see Dr. Jacinto Reap

## 2015-04-16 ENCOUNTER — Encounter: Payer: Self-pay | Admitting: Family Medicine

## 2015-04-16 ENCOUNTER — Ambulatory Visit (INDEPENDENT_AMBULATORY_CARE_PROVIDER_SITE_OTHER): Payer: No Typology Code available for payment source | Admitting: Family Medicine

## 2015-04-16 VITALS — BP 126/80 | HR 90 | Temp 98.3°F | Wt 155.0 lb

## 2015-04-16 DIAGNOSIS — G8929 Other chronic pain: Secondary | ICD-10-CM | POA: Diagnosis not present

## 2015-04-16 DIAGNOSIS — M79644 Pain in right finger(s): Secondary | ICD-10-CM

## 2015-04-16 DIAGNOSIS — M549 Dorsalgia, unspecified: Secondary | ICD-10-CM

## 2015-04-16 MED ORDER — OXYCODONE HCL 15 MG PO TABS: 15.0000 mg | ORAL_TABLET | Freq: Three times a day (TID) | ORAL | Status: DC | PRN

## 2015-04-16 NOTE — Progress Notes (Signed)
   Subjective:    Patient ID: [name], female    DOB: [birth year], 54 y.o.   MRN: [id number]  HPI Patient is seen for follow-up regarding chronic back pain. Refer to prior notes. We referred to pain management clinic but she was having issues with cost and frequency of having to go back there every other week. She now comes in requesting referral to [facility] in [place].  She's had chronic back pain for many years has been on regimen of oxycodone for many years. She has pain that has been difficult to control even with this dosage at times. We had strongly recommended referral pain management Center. She's tried multiple other medications without improvement.  Also complaining of right thumb pain. No injury. Right hand dominant. She has what sounds like a trigger finger with occasional catching of her thumb but also significant pain at the [facility] and MCP joints of the thumb.  Past Medical History  Diagnosis Date  . PANIC DISORDER 04/18/2007  . TOBACCO ABUSE 07/17/2009  . CARPAL TUNNEL SYNDROME, BILATERAL 01/16/2008  . ASTHMA UNSPECIFIED WITH EXACERBATION 07/21/2010  . COPD 05/08/2008  . GERD 04/18/2007  . HELICOBACTER PYLORI GASTRITIS, HX OF 04/18/2007   Past Surgical History  Procedure Laterality Date  . Appendectomy    . Oophorectomy      reports that she has been smoking Cigarettes.  She has a 50 pack-year smoking history. She has never used smokeless tobacco. She reports that she does not drink alcohol or use illicit drugs. family history includes Breast cancer in her paternal aunt; Cancer in her father; Clotting disorder in her brother; Diabetes in her mother; Heart disease in her father; Stomach cancer in her paternal uncle. Allergies  Allergen Reactions  . Aspirin     REACTION: had h. pylori due to too many goody powders. told not to take any more asa      Review of Systems  Musculoskeletal: Positive for back pain.  Neurological: Negative for weakness  and numbness.       Objective:   Physical Exam  Constitutional: She appears well-developed and well-nourished.  Cardiovascular: Normal rate and regular rhythm.   Pulmonary/Chest: Effort normal and breath sounds normal. No respiratory distress. She has no wheezes. She has no rales.  Musculoskeletal:  Right thumb reveals some thickening at the [facility] and MCP joints. She has localized tenderness over those regions. She also has tender nodule volar surface right thumb near the base compatible with likely symptomatic trigger finger. She does have full range of motion thumb at this time          Assessment & Plan:  #1 chronic low back pain. Set up referral to pain management as above. We agreed to one refill of oxycodone until she can get in to see them.  We received clear documentation last week that she has been released from her previous pain management contract #2 right thumb pain. Suspect osteoarthritis and probable trigger thumb. We offered injection for trigger thumb and she wishes to wait.

## 2015-04-16 NOTE — Progress Notes (Signed)
Pre visit review using our clinic review tool, if applicable. No additional management support is needed unless otherwise documented below in the visit note. 

## 2015-04-17 ENCOUNTER — Telehealth: Payer: Self-pay | Admitting: Family Medicine

## 2015-04-17 NOTE — Telephone Encounter (Signed)
Pt call to say she wish Dr Elease Hashimoto would continue to be her pain doctor.

## 2015-04-27 ENCOUNTER — Telehealth: Payer: Self-pay | Admitting: Family Medicine

## 2015-04-27 NOTE — Telephone Encounter (Signed)
The patient declined the first clinic secondary to co-pay issues.  Unless she knows of any other pain clinics that she wishes to pursue,  I dont have any other suggestions at this time.

## 2015-04-27 NOTE — Telephone Encounter (Signed)
[place] states they do not have anything to offer this pt . Second pain clinic that denied pt referral please advise

## 2015-04-28 ENCOUNTER — Other Ambulatory Visit: Payer: Self-pay | Admitting: Family Medicine

## 2015-04-28 NOTE — Telephone Encounter (Signed)
Last visit 04/16/15 Last refill 01/29/15 #75 2 refill

## 2015-04-28 NOTE — Telephone Encounter (Signed)
Refill for 3 months. 

## 2015-05-02 ENCOUNTER — Other Ambulatory Visit: Payer: Self-pay | Admitting: Family Medicine

## 2015-05-15 ENCOUNTER — Encounter: Payer: Self-pay | Admitting: Family Medicine

## 2015-05-15 ENCOUNTER — Ambulatory Visit (INDEPENDENT_AMBULATORY_CARE_PROVIDER_SITE_OTHER): Payer: No Typology Code available for payment source | Admitting: Family Medicine

## 2015-05-15 VITALS — BP 130/80 | HR 95 | Temp 98.2°F | Wt 157.0 lb

## 2015-05-15 DIAGNOSIS — M19041 Primary osteoarthritis, right hand: Secondary | ICD-10-CM

## 2015-05-15 DIAGNOSIS — M1811 Unilateral primary osteoarthritis of first carpometacarpal joint, right hand: Secondary | ICD-10-CM

## 2015-05-15 DIAGNOSIS — M545 Low back pain, unspecified: Secondary | ICD-10-CM

## 2015-05-15 DIAGNOSIS — G8929 Other chronic pain: Secondary | ICD-10-CM | POA: Diagnosis not present

## 2015-05-15 MED ORDER — OXYCODONE HCL 15 MG PO TABS: 15.0000 mg | ORAL_TABLET | Freq: Three times a day (TID) | ORAL | Status: DC | PRN

## 2015-05-15 NOTE — Progress Notes (Signed)
   Subjective:    Patient ID: [name], female    DOB: [birth year], 54 y.o.   MRN: [id number]  HPI Patient here with right thumb pain. No injury. Pain is mostly at the MCP joint of the right thumb. She has some mild swelling. No erythema. No ecchymosis. Right-hand dominant. Exacerbated by movement. No alleviating factors.  She has chronic back pain and has been on chronic opiates for several years. She's tried looking into pain management recently (per our suggestion) but has had problems with adequate insurance coverage. She hopes to have change of insurance soon. She has already set up appointment with spine and scoliosis practice in September.  Past Medical History  Diagnosis Date  . PANIC DISORDER 04/18/2007  . TOBACCO ABUSE 07/17/2009  . CARPAL TUNNEL SYNDROME, BILATERAL 01/16/2008  . ASTHMA UNSPECIFIED WITH EXACERBATION 07/21/2010  . COPD 05/08/2008  . GERD 04/18/2007  . HELICOBACTER PYLORI GASTRITIS, HX OF 04/18/2007   Past Surgical History  Procedure Laterality Date  . Appendectomy    . Oophorectomy      reports that she has been smoking Cigarettes.  She has a 50 pack-year smoking history. She has never used smokeless tobacco. She reports that she does not drink alcohol or use illicit drugs. family history includes Breast cancer in her paternal aunt; Cancer in her father; Clotting disorder in her brother; Diabetes in her mother; Heart disease in her father; Stomach cancer in her paternal uncle. Allergies  Allergen Reactions  . Aspirin     REACTION: had h. pylori due to too many goody powders. told not to take any more asa      Review of Systems  Constitutional: Negative for fever and chills.  Musculoskeletal: Positive for back pain and arthralgias.  Neurological: Negative for weakness and numbness.       Objective:   Physical Exam  Constitutional: She appears well-developed and well-nourished.  Cardiovascular: Normal rate and regular rhythm.   Pulmonary/Chest: Effort  normal and breath sounds normal. No respiratory distress. She has no wheezes. She has no rales.  Musculoskeletal:  Right thumb reveals some mild hypertrophic changes at the metacarpophalangeal joint.  She has some localized tenderness to palpation there. No warmth. No erythema. Wrist full range of motion          Assessment & Plan:  #1 right thumb pain. Suspect degenerative arthritis. She inquired about injections and I explained that I do not do intra-articular thumb injections for arthritis. We offered referral to hand specialist but at this point she wishes to observe #2 chronic back pain. Refilled her oxycodone. She is looking at referral to spine and scoliosis specialist in September and she will continue to explore options for possible chronic pain management as her insurance changes

## 2015-05-15 NOTE — Progress Notes (Signed)
Pre visit review using our clinic review tool, if applicable. No additional management support is needed unless otherwise documented below in the visit note. 

## 2015-05-15 NOTE — Patient Instructions (Signed)
Check on coverage for Stiolto- this would take the place of the Spiriva.

## 2015-06-10 ENCOUNTER — Telehealth: Payer: Self-pay | Admitting: Family Medicine

## 2015-06-10 NOTE — Telephone Encounter (Signed)
Pt request refill of the following: oxyCODONE (ROXICODONE) 15 MG immediate release tablet  Pt said she did check on the inhaler that Dr Elease Hashimoto requested and it was expensive.     Phamacy:

## 2015-06-10 NOTE — Telephone Encounter (Signed)
Last visit 05/15/15 Last refill 05/15/15 #90 0 refill

## 2015-06-10 NOTE — Telephone Encounter (Signed)
Too soon

## 2015-06-11 NOTE — Telephone Encounter (Signed)
yes

## 2015-06-11 NOTE — Telephone Encounter (Signed)
Is it okay to order. I can put on Rx refill on or after 06/15/15

## 2015-06-11 NOTE — Telephone Encounter (Signed)
The pt called back asking to be able to pick up her medicine tomorrow.  She states she will be out of town till Tuesday of next week.

## 2015-06-12 MED ORDER — OXYCODONE HCL 15 MG PO TABS: 15.0000 mg | ORAL_TABLET | Freq: Three times a day (TID) | ORAL | Status: DC | PRN

## 2015-06-12 NOTE — Telephone Encounter (Signed)
Pt is aware that Rx is ready for pickup  

## 2015-07-13 ENCOUNTER — Telehealth: Payer: Self-pay | Admitting: Family Medicine

## 2015-07-13 NOTE — Telephone Encounter (Signed)
Pt needs new oxycodone °

## 2015-07-14 NOTE — Telephone Encounter (Signed)
Pt last visit 05/15/15. Last Rx refill 06/12/15 #90. Please advise

## 2015-07-15 MED ORDER — OXYCODONE HCL 15 MG PO TABS: 15.0000 mg | ORAL_TABLET | Freq: Three times a day (TID) | ORAL | Status: DC | PRN

## 2015-07-15 NOTE — Telephone Encounter (Signed)
Pt is out of med and needs med today please

## 2015-07-15 NOTE — Telephone Encounter (Signed)
Refill OK

## 2015-07-15 NOTE — Telephone Encounter (Signed)
Rx is ready to pick up. Pt is advise

## 2015-07-24 ENCOUNTER — Ambulatory Visit: Payer: No Typology Code available for payment source

## 2015-07-27 ENCOUNTER — Other Ambulatory Visit: Payer: Self-pay | Admitting: Family Medicine

## 2015-07-28 NOTE — Telephone Encounter (Signed)
Patient requesting refill on Alprazolam (Xanax) 2mg  tablet # 75  Last seen: 05/15/15 Last refill: 04/29/15 #75 with two refills

## 2015-07-29 NOTE — Telephone Encounter (Signed)
Refill with 2 additional refills. 

## 2015-08-10 ENCOUNTER — Telehealth: Payer: Self-pay | Admitting: Family Medicine

## 2015-08-10 NOTE — Telephone Encounter (Signed)
Pt needs new rx oxycodone °

## 2015-08-12 NOTE — Telephone Encounter (Signed)
Patient calling to check status of refill. Will be out of medication on Friday.

## 2015-08-13 ENCOUNTER — Other Ambulatory Visit: Payer: Self-pay | Admitting: *Deleted

## 2015-08-13 MED ORDER — OXYCODONE HCL 15 MG PO TABS: 15.0000 mg | ORAL_TABLET | Freq: Three times a day (TID) | ORAL | Status: DC | PRN

## 2015-08-13 NOTE — Telephone Encounter (Signed)
Pt would like to know if another doctor could refill for her?

## 2015-08-13 NOTE — Telephone Encounter (Signed)
Last visit: 05/15/2015, no pending appt scheduled Last refill: 07/15/2015 #90 Please advise on refill in Dr. Anastasio Auerbach absence. Thanks.

## 2015-08-13 NOTE — Telephone Encounter (Signed)
Cory approved 1x refill. Patient is aware Rx is upfront and ready to be picked up.

## 2015-09-09 ENCOUNTER — Telehealth: Payer: Self-pay | Admitting: Family Medicine

## 2015-09-09 NOTE — Telephone Encounter (Signed)
[name] called saying she needs a Rx for Oxycodone. Please call the pt if you can.  Pt ph# [phone] Thank you.

## 2015-09-09 NOTE — Telephone Encounter (Signed)
Pt was not asking for her RX early. She wants to pick this up on 09/14/2015 when she comes in for her Pnemo shot.  Last fill was 08/13/2015 #90.

## 2015-09-10 NOTE — Telephone Encounter (Signed)
Refill December 5th OK.

## 2015-09-14 ENCOUNTER — Ambulatory Visit (INDEPENDENT_AMBULATORY_CARE_PROVIDER_SITE_OTHER): Payer: No Typology Code available for payment source | Admitting: Family Medicine

## 2015-09-14 DIAGNOSIS — Z23 Encounter for immunization: Secondary | ICD-10-CM | POA: Diagnosis not present

## 2015-09-14 MED ORDER — OXYCODONE HCL 15 MG PO TABS: 15.0000 mg | ORAL_TABLET | Freq: Three times a day (TID) | ORAL | Status: DC | PRN

## 2015-09-14 NOTE — Telephone Encounter (Signed)
Printed for pt--given to her in the office when she received her flu vaccine.

## 2015-10-13 ENCOUNTER — Telehealth: Payer: Self-pay | Admitting: Family Medicine

## 2015-10-13 NOTE — Telephone Encounter (Signed)
Pt needs new rx oxycodone °

## 2015-10-13 NOTE — Telephone Encounter (Signed)
Not due until January 5th.

## 2015-10-14 NOTE — Telephone Encounter (Signed)
Pt called to check on status of rx. Pt made aware that rx was not due until the 5th.

## 2015-10-15 MED ORDER — OXYCODONE HCL 15 MG PO TABS: 15.0000 mg | ORAL_TABLET | Freq: Three times a day (TID) | ORAL | Status: DC | PRN

## 2015-10-15 NOTE — Telephone Encounter (Signed)
Printed for signature

## 2015-10-15 NOTE — Telephone Encounter (Signed)
Pt is aware that RX is up front to be picked up.

## 2015-10-28 ENCOUNTER — Other Ambulatory Visit: Payer: Self-pay | Admitting: Family Medicine

## 2015-10-29 NOTE — Telephone Encounter (Signed)
Refill OK

## 2015-10-29 NOTE — Telephone Encounter (Signed)
Requesting refill on: alprazolam Duanne Moron) 2 MG tablet  #75 - 0 refills  Last seen: 05/15/15 Last refill: 07/29/15

## 2015-10-30 NOTE — Telephone Encounter (Signed)
Rx called in to pharmacy. 

## 2015-10-30 NOTE — Telephone Encounter (Signed)
Rx placed with [facility] for Dr. Erick Blinks signature.

## 2015-11-11 ENCOUNTER — Telehealth: Payer: Self-pay | Admitting: Family Medicine

## 2015-11-11 NOTE — Telephone Encounter (Signed)
Patient would like her Oxycodone medication refilled.

## 2015-11-11 NOTE — Telephone Encounter (Signed)
Due on 11/15/2015 for refill.

## 2015-11-13 MED ORDER — OXYCODONE HCL 15 MG PO TABS: 15.0000 mg | ORAL_TABLET | Freq: Three times a day (TID) | ORAL | Status: DC | PRN

## 2015-11-13 NOTE — Telephone Encounter (Signed)
Printed for signature

## 2015-11-13 NOTE — Telephone Encounter (Signed)
Pt is aware that RX is up front to be picked up.

## 2015-12-11 ENCOUNTER — Telehealth: Payer: Self-pay | Admitting: Family Medicine

## 2015-12-11 MED ORDER — OXYCODONE HCL 15 MG PO TABS: 15.0000 mg | ORAL_TABLET | Freq: Three times a day (TID) | ORAL | Status: DC | PRN

## 2015-12-11 NOTE — Telephone Encounter (Signed)
Refill OK

## 2015-12-11 NOTE — Telephone Encounter (Signed)
Pt is aware via RX is ready for pick up.

## 2015-12-11 NOTE — Telephone Encounter (Signed)
° ° °

## 2015-12-11 NOTE — Telephone Encounter (Signed)
Last refill was 11/13/2015 Last seen 05-15-2015 No pending appt scheduled

## 2015-12-24 ENCOUNTER — Other Ambulatory Visit: Payer: Self-pay | Admitting: Family Medicine

## 2015-12-25 NOTE — Telephone Encounter (Signed)
Due on 3/20--Monday.

## 2015-12-28 ENCOUNTER — Telehealth: Payer: Self-pay | Admitting: *Deleted

## 2015-12-28 NOTE — Telephone Encounter (Signed)
Medication sent in for patient. 

## 2015-12-28 NOTE — Telephone Encounter (Signed)
[facility] [phone] Refill request  alprazolam Duanne Moron) 2 MG tablet

## 2015-12-30 ENCOUNTER — Ambulatory Visit (INDEPENDENT_AMBULATORY_CARE_PROVIDER_SITE_OTHER): Payer: BLUE CROSS/BLUE SHIELD | Admitting: Family Medicine

## 2015-12-30 ENCOUNTER — Ambulatory Visit: Payer: No Typology Code available for payment source | Admitting: Family Medicine

## 2015-12-30 ENCOUNTER — Encounter: Payer: Self-pay | Admitting: Family Medicine

## 2015-12-30 VITALS — BP 111/76 | HR 80 | Temp 98.0°F | Resp 20 | Wt 157.8 lb

## 2015-12-30 DIAGNOSIS — G43909 Migraine, unspecified, not intractable, without status migrainosus: Secondary | ICD-10-CM | POA: Insufficient documentation

## 2015-12-30 DIAGNOSIS — J0100 Acute maxillary sinusitis, unspecified: Secondary | ICD-10-CM | POA: Insufficient documentation

## 2015-12-30 DIAGNOSIS — G43809 Other migraine, not intractable, without status migrainosus: Secondary | ICD-10-CM | POA: Diagnosis not present

## 2015-12-30 MED ORDER — PREDNISONE 50 MG PO TABS: 50.0000 mg | ORAL_TABLET | Freq: Every day | ORAL | Status: DC

## 2015-12-30 MED ORDER — AMOXICILLIN-POT CLAVULANATE 875-125 MG PO TABS: 1.0000 | ORAL_TABLET | Freq: Two times a day (BID) | ORAL | Status: DC

## 2015-12-30 MED ORDER — SUMATRIPTAN SUCCINATE 50 MG PO TABS: 50.0000 mg | ORAL_TABLET | ORAL | Status: DC | PRN

## 2015-12-30 NOTE — Patient Instructions (Signed)
Sinusitis, Adult Sinusitis is redness, soreness, and inflammation of the paranasal sinuses. Paranasal sinuses are air pockets within the bones of your face. They are located beneath your eyes, in the middle of your forehead, and above your eyes. In healthy paranasal sinuses, mucus is able to drain out, and air is able to circulate through them by way of your nose. However, when your paranasal sinuses are inflamed, mucus and air can become trapped. This can allow bacteria and other germs to grow and cause infection. Sinusitis can develop quickly and last only a short time (acute) or continue over a long period (chronic). Sinusitis that lasts for more than 12 weeks is considered chronic. CAUSES Causes of sinusitis include:  Allergies.  Structural abnormalities, such as displacement of the cartilage that separates your nostrils (deviated septum), which can decrease the air flow through your nose and sinuses and affect sinus drainage.  Functional abnormalities, such as when the small hairs (cilia) that line your sinuses and help remove mucus do not work properly or are not present. SIGNS AND SYMPTOMS Symptoms of acute and chronic sinusitis are the same. The primary symptoms are pain and pressure around the affected sinuses. Other symptoms include:  Upper toothache.  Earache.  Headache.  Bad breath.  Decreased sense of smell and taste.  A cough, which worsens when you are lying flat.  Fatigue.  Fever.  Thick drainage from your nose, which often is green and may contain pus (purulent).  Swelling and warmth over the affected sinuses. DIAGNOSIS Your health care provider will perform a physical exam. During your exam, your health care provider may perform any of the following to help determine if you have acute sinusitis or chronic sinusitis:  Look in your nose for signs of abnormal growths in your nostrils (nasal polyps).  Tap over the affected sinus to check for signs of  infection.  View the inside of your sinuses using an imaging device that has a light attached (endoscope). If your health care provider suspects that you have chronic sinusitis, one or more of the following tests may be recommended:  Allergy tests.  Nasal culture. A sample of mucus is taken from your nose, sent to a lab, and screened for bacteria.  Nasal cytology. A sample of mucus is taken from your nose and examined by your health care provider to determine if your sinusitis is related to an allergy. TREATMENT Most cases of acute sinusitis are related to a viral infection and will resolve on their own within 10 days. Sometimes, medicines are prescribed to help relieve symptoms of both acute and chronic sinusitis. These may include pain medicines, decongestants, nasal steroid sprays, or saline sprays. However, for sinusitis related to a bacterial infection, your health care provider will prescribe antibiotic medicines. These are medicines that will help kill the bacteria causing the infection. Rarely, sinusitis is caused by a fungal infection. In these cases, your health care provider will prescribe antifungal medicine. For some cases of chronic sinusitis, surgery is needed. Generally, these are cases in which sinusitis recurs more than 3 times per year, despite other treatments. HOME CARE INSTRUCTIONS  Drink plenty of water. Water helps thin the mucus so your sinuses can drain more easily.  Use a humidifier.  Inhale steam 3-4 times a day (for example, sit in the bathroom with the shower running).  Apply a warm, moist washcloth to your face 3-4 times a day, or as directed by your health care provider.  Use saline nasal sprays to help   moisten and clean your sinuses.  Take medicines only as directed by your health care provider.  If you were prescribed either an antibiotic or antifungal medicine, finish it all even if you start to feel better. SEEK IMMEDIATE MEDICAL CARE IF:  You have  increasing pain or severe headaches.  You have nausea, vomiting, or drowsiness.  You have swelling around your face.  You have vision problems.  You have a stiff neck.  You have difficulty breathing.   This information is not intended to replace advice given to you by your health care provider. Make sure you discuss any questions you have with your health care provider.   Document Released: 09/26/2005 Document Revised: 10/17/2014 Document Reviewed: 10/11/2011 Elsevier Interactive Patient Education 2016 [place], nasal saline, consider flonase. Humidifier.  Augmentin prednisone and imitrex called in for you.

## 2015-12-30 NOTE — Progress Notes (Signed)
Patient ID: [name], female   DOB: 1961/04/28, 55 y.o.   MRN: [id number]    [name] , 10/03/1961, 55 y.o., female MRN: [id number]  CC: headache Subjective: Pt presents for an acute OV with complaints of left sided  The last two night. Associated symptoms include facial pressure, nasal congestion. Patient denies visual changes, nausea, vomit, diarrhea, eye pain, dizziness or syncope. Patient states, the other symptoms she noticed four days ago, but the headaches have been the last 2 nights waking her from sleep at about 2 am. Patient states she was having to take 1 /2 of one of her pain pills to resolve headache. After pill she was able to fall back asleep and upon awakening the headache would be resolved. Pt has tried to use warm compress over sinus, because she thought she had a sinus infection. Patient reports history of migraines and she use to need to take Imitrex, but admits she has not had one in many years. She does endorse photophobia with headache. She feels the headache is similar to her migraines. She is a [employer]. Her grandchildren have been sick. She endorses increased stress, that started over the weekend. Pt is prescribed xanax.   Everyday smoker  Allergies  Allergen Reactions  . Aspirin     REACTION: had h. pylori due to too many goody powders. told not to take any more asa   Social History  Substance Use Topics  . Smoking status: Current Every Day Smoker -- 1.00 packs/day for 50 years    Types: Cigarettes  . Smokeless tobacco: Never Used     Comment: Trying nicotine patches, made her nauseous at first, going to cut patches in half.  . Alcohol Use: No   Past Medical History  Diagnosis Date  . PANIC DISORDER 04/18/2007  . TOBACCO ABUSE 07/17/2009  . CARPAL TUNNEL SYNDROME, BILATERAL 01/16/2008  . ASTHMA UNSPECIFIED WITH EXACERBATION 07/21/2010  . COPD 05/08/2008  . GERD 04/18/2007  . HELICOBACTER PYLORI GASTRITIS, HX OF 04/18/2007   Past Surgical History  Procedure  Laterality Date  . Appendectomy    . Oophorectomy     Family History  Problem Relation Age of Onset  . Diabetes Mother   . Cancer Father     lung smoke   . Heart disease Father   . Clotting disorder Brother   . Breast cancer Paternal Aunt   . Stomach cancer Paternal Uncle      Medication List       This list is accurate as of: 12/30/15  2:08 PM.  Always use your most recent med list.               Albuterol Sulfate 108 (90 Base) MCG/ACT Aepb  Commonly known as:  PROAIR RESPICLICK  Inhale 2 puffs into the lungs every 4 (four) hours as needed.     alprazolam 2 MG tablet  Commonly known as:  XANAX  TAKE ONE-HALF TABLET IN THE MORNING, ONETABLET IN THE AFTERNOON, AND ONE TABLET AT NIGHT     atorvastatin 40 MG tablet  Commonly known as:  LIPITOR  Take 1 tablet (40 mg total) by mouth daily.     beclomethasone 40 MCG/ACT inhaler  Commonly known as:  QVAR  Inhale 2 puffs into the lungs 2 (two) times daily.     omeprazole 20 MG capsule  Commonly known as:  PRILOSEC  TAKE ONE CAPSULE BY MOUTH EVERY DAY     oxyCODONE 15 MG immediate release tablet  Commonly known as:  ROXICODONE  Take 1 tablet (15 mg total) by mouth every 8 (eight) hours as needed.     tiotropium 18 MCG inhalation capsule  Commonly known as:  SPIRIVA  Place 18 mcg into inhaler and inhale daily as needed. Reported on 12/30/2015        ROS: Negative, with the exception of above mentioned in HPI  Objective:  BP 111/76 mmHg  Pulse 80  Temp(Src) 98 F (36.7 C)  Resp 20  Wt 157 lb 12 oz (71.555 kg)  SpO2 95% Body mass index is 24.7 kg/(m^2). Gen: Afebrile. No acute distress. Nontoxic in appearance, well developed, well nourished.  HENT: AT. [place]. Mild bilateral facial swelling below eyes. Bilateral TM visualized, with bilateral air fluid levels, no erythema, no bulging.  MMM, no oral lesions. Bilateral nares erythema and bogginess.. Throat without erythema or exudates. Cough and hoarseness on exam. TTP  left max sinus.  Eyes:Pupils Equal Round Reactive to light, Extraocular movements intact,  Conjunctiva without redness, discharge or icterus. Neck/lymp/endocrine: Supple, no lymphadenopathy CV: RRR  Chest: CTAB, no wheeze or crackles. Diminished breath sounds bilateral.  Neuro: Normal gait. PERLA. EOMi. Alert. Oriented x3 Cranial nerves II through XII intact. Psych: Normal affect, dress and demeanor. Normal speech. Normal thought content and judgment..   Assessment/Plan: [name] is a 55 y.o. female present for acute OV for  1. Acute maxillary sinusitis, recurrence not specified - Signs of maxillary sinus infection on exam - amoxicillin-clavulanate (AUGMENTIN) 875-125 MG tablet; Take 1 tablet by mouth 2 (two) times daily.  Dispense: 20 tablet; Refill: 0 - predniSONE (DELTASONE) 50 MG tablet; Take 1 tablet (50 mg total) by mouth daily with breakfast.  Dispense: 5 tablet; Refill: 0 - Rest, flonase, hydrate, nasal saline, humidifier - F/U with PCP within 1 week.  2. Other migraine without status migrainosus, not intractable - Migraines secondary to increased stress? - SUMAtriptan (IMITREX) 50 MG tablet; Take 1 tablet (50 mg total) by mouth every 2 (two) hours as needed for migraine. May repeat in 2 hours if headache persists or recurs.  Dispense: 10 tablet; Refill: 0 - history consistent with migraine, no headache currently. Imitrex prescribed.    - F/u with PCP for migraine/stress follow up within 1 week.  electronically signed by:  Howard Pouch, DO  [facility]

## 2016-01-11 ENCOUNTER — Telehealth: Payer: Self-pay | Admitting: Family Medicine

## 2016-01-11 NOTE — Telephone Encounter (Signed)
° ° °

## 2016-01-11 NOTE — Telephone Encounter (Signed)
Refill OK

## 2016-01-11 NOTE — Telephone Encounter (Signed)
Pt requesting refill Oxycodone, last fill 3/3 #90. Okay to refill?

## 2016-01-12 MED ORDER — OXYCODONE HCL 15 MG PO TABS: 15.0000 mg | ORAL_TABLET | Freq: Three times a day (TID) | ORAL | Status: DC | PRN

## 2016-01-12 NOTE — Telephone Encounter (Signed)
Printed for signature

## 2016-01-12 NOTE — Telephone Encounter (Signed)
Pt is aware that RX is up front for pick Up.

## 2016-02-08 ENCOUNTER — Telehealth: Payer: Self-pay | Admitting: Family Medicine

## 2016-02-08 NOTE — Telephone Encounter (Signed)
Pt need new Rx for Oxycodone 15 mg

## 2016-02-08 NOTE — Telephone Encounter (Signed)
May refill by May 4th.

## 2016-02-08 NOTE — Telephone Encounter (Signed)
Last filled on 01/12/2016 #90 No pending appt  Last seen on 09/14/2015

## 2016-02-10 ENCOUNTER — Telehealth: Payer: Self-pay | Admitting: Family Medicine

## 2016-02-10 MED ORDER — ALBUTEROL SULFATE 108 (90 BASE) MCG/ACT IN AEPB: 2.0000 | INHALATION_SPRAY | RESPIRATORY_TRACT | Status: DC | PRN

## 2016-02-10 NOTE — Telephone Encounter (Signed)
Pt request refill of the following:    Albuterol    She said she has not had this rx in a while   Phamacy:

## 2016-02-10 NOTE — Telephone Encounter (Signed)
Medication sent into pharmacy  

## 2016-02-11 MED ORDER — OXYCODONE HCL 15 MG PO TABS: 15.0000 mg | ORAL_TABLET | Freq: Three times a day (TID) | ORAL | Status: DC | PRN

## 2016-02-11 NOTE — Telephone Encounter (Signed)
Printed for Dr. Sarajane Jews to sign. Pt is aware that RX will be up front.

## 2016-02-11 NOTE — Telephone Encounter (Signed)
Printed for signature

## 2016-02-11 NOTE — Telephone Encounter (Signed)
Pt called wanted to know if Rx was ready for pick-up.

## 2016-02-17 ENCOUNTER — Ambulatory Visit (INDEPENDENT_AMBULATORY_CARE_PROVIDER_SITE_OTHER): Payer: BLUE CROSS/BLUE SHIELD | Admitting: Family Medicine

## 2016-02-17 VITALS — BP 128/90 | HR 110 | Temp 98.3°F | Ht 67.0 in | Wt 157.1 lb

## 2016-02-17 DIAGNOSIS — R739 Hyperglycemia, unspecified: Secondary | ICD-10-CM | POA: Diagnosis not present

## 2016-02-17 DIAGNOSIS — M545 Low back pain, unspecified: Secondary | ICD-10-CM

## 2016-02-17 DIAGNOSIS — G8929 Other chronic pain: Secondary | ICD-10-CM

## 2016-02-17 DIAGNOSIS — E785 Hyperlipidemia, unspecified: Secondary | ICD-10-CM

## 2016-02-17 NOTE — Progress Notes (Signed)
Subjective:    Patient ID: [name], female    DOB: 16-Sep-1961, 55 y.o.   MRN: [id number]  HPI Patient seen for the following issues  She states for the past 2 months she has been craving "sweets " She states that her smell and taste are intact. She has had some urine frequency but no burning with urination. Occasional increased thirst. No prior history of diabetes but does have positive family history. Appetite and weight have been stable.  History of severe dyslipidemia. She is supposed to be on Lipitor 40 mg daily. She has frequent achy discomfort in her legs and has not been taking this much at all recently. She has not tried lower dosage. Previous myalgias with Crestor. Denies any claudication symptoms. No history of known CAD.  Long history of nicotine use. No recent hemoptysis. Remains on Spiriva for COPD.  Chronic back pain. She's been taking oxycodone for several years. No recent constipation issues. Still has poorly controlled pain at times even with medication. She did not get relief with over-the-counter medications or lower doses of opioid. She did not get adequate relief with nonsteroidal medication. She has both chronic neck and low back pain. Was not felt to be surgical candidate as per process previous screening. We had previously referred her to back specialist and they were unable to offer her any relief. Current pain is functionally limiting some activities. With medication she is able to perform basic ADLs.  Past Medical History  Diagnosis Date  . PANIC DISORDER 04/18/2007  . TOBACCO ABUSE 07/17/2009  . CARPAL TUNNEL SYNDROME, BILATERAL 01/16/2008  . ASTHMA UNSPECIFIED WITH EXACERBATION 07/21/2010  . COPD 05/08/2008  . GERD 04/18/2007  . HELICOBACTER PYLORI GASTRITIS, HX OF 04/18/2007   Past Surgical History  Procedure Laterality Date  . Appendectomy    . Oophorectomy      reports that she has been smoking Cigarettes.  She has a 50 pack-year  smoking history. She has never used smokeless tobacco. She reports that she does not drink alcohol or use illicit drugs. family history includes Breast cancer in her paternal aunt; Cancer in her father; Clotting disorder in her brother; Diabetes in her mother; Heart disease in her father; Stomach cancer in her paternal uncle. Allergies  Allergen Reactions  . Aspirin     REACTION: had h. pylori due to too many goody powders. told not to take any more asa      Review of Systems  Constitutional: Positive for fatigue. Negative for fever and chills.  Eyes: Negative for visual disturbance.  Respiratory: Negative for cough, chest tightness, shortness of breath and wheezing.   Cardiovascular: Negative for chest pain, palpitations and leg swelling.  Musculoskeletal: Positive for back pain.  Neurological: Negative for dizziness, seizures, syncope, weakness, light-headedness and headaches.       Objective:   Physical Exam  Constitutional: She appears well-developed and well-nourished.  Eyes: Pupils are equal, round, and reactive to light.  Neck: Neck supple. No JVD present. No thyromegaly present.  Cardiovascular: Normal rate and regular rhythm.  Exam reveals no gallop.   Pulmonary/Chest: Effort normal and breath sounds normal. No respiratory distress. She has no wheezes. She has no rales.  She has very small (about 0.5 cm) mobile nontender node left axillary region.  No other adenopathy noted.  Musculoskeletal: She exhibits no edema.  Neurological: She is alert.          Assessment & Plan:  #1 patient complains of craving for "sweets ".  She does describe some mild urine frequency. Check glucose today nonfasting 5 hours postprandial 114. We discussed importance of limiting refined sugars and white starches especially with her family history of diabetes. She is encouraged to lose some weight.  Check A1C at follow up when she gets fasting lipids.  #2 dyslipidemia. We have recommended she  try reduce Lipitor to 20 mg once daily and if able to take this regularly recheck lipids in about 6 weeks. We'll also check A1c then.  #3 chronic back pain. She's been maintained  Oxycodone for several years. No history of misuse.  Eulas Post MD [facility] at [facility]

## 2016-02-17 NOTE — Progress Notes (Signed)
Pre visit review using our clinic review tool, if applicable. No additional management support is needed unless otherwise documented below in the visit note. 

## 2016-02-17 NOTE — Patient Instructions (Signed)
Why follow it? Research shows. . Those who follow the Mediterranean diet have a reduced risk of heart disease  . The diet is associated with a reduced incidence of Parkinson's and Alzheimer's diseases . People following the diet may have longer life expectancies and lower rates of chronic diseases  . The Dietary Guidelines for Americans recommends the Mediterranean diet as an eating plan to promote health and prevent disease  What Is the Mediterranean Diet?  . Healthy eating plan based on typical foods and recipes of Mediterranean-style cooking . The diet is primarily a plant based diet; these foods should make up a majority of meals   Starches - Plant based foods should make up a majority of meals - They are an important sources of vitamins, minerals, energy, antioxidants, and fiber - Choose whole grains, foods high in fiber and minimally processed items  - Typical grain sources include wheat, oats, barley, corn, brown rice, bulgar, farro, millet, polenta, couscous  - Various types of beans include chickpeas, lentils, fava beans, black beans, white beans   Fruits  Veggies - Large quantities of antioxidant rich fruits & veggies; 6 or more servings  - Vegetables can be eaten raw or lightly drizzled with oil and cooked  - Vegetables common to the traditional Mediterranean Diet include: artichokes, arugula, beets, broccoli, brussel sprouts, cabbage, carrots, celery, collard greens, cucumbers, eggplant, kale, leeks, lemons, lettuce, mushrooms, okra, onions, peas, peppers, potatoes, pumpkin, radishes, rutabaga, shallots, spinach, sweet potatoes, turnips, zucchini - Fruits common to the Mediterranean Diet include: apples, apricots, avocados, cherries, clementines, dates, figs, grapefruits, grapes, melons, nectarines, oranges, peaches, pears, pomegranates, strawberries, tangerines  Fats - Replace butter and margarine with healthy oils, such as olive oil, canola oil, and tahini  - Limit nuts to no  more than a handful a day  - Nuts include walnuts, almonds, pecans, pistachios, pine nuts  - Limit or avoid candied, honey roasted or heavily salted nuts - Olives are central to the Mediterranean diet - can be eaten whole or used in a variety of dishes   Meats Protein - Limiting red meat: no more than a few times a month - When eating red meat: choose lean cuts and keep the portion to the size of deck of cards - Eggs: approx. 0 to 4 times a week  - Fish and lean poultry: at least 2 a week  - Healthy protein sources include, chicken, turkey, lean beef, lamb - Increase intake of seafood such as tuna, salmon, trout, mackerel, shrimp, scallops - Avoid or limit high fat processed meats such as sausage and bacon  Dairy - Include moderate amounts of low fat dairy products  - Focus on healthy dairy such as fat free yogurt, skim milk, low or reduced fat cheese - Limit dairy products higher in fat such as whole or 2% milk, cheese, ice cream  Alcohol - Moderate amounts of red wine is ok  - No more than 5 oz daily for women (all ages) and men older than age 65  - No more than 10 oz of wine daily for men younger than 65  Other - Limit sweets and other desserts  - Use herbs and spices instead of salt to flavor foods  - Herbs and spices common to the traditional Mediterranean Diet include: basil, bay leaves, chives, cloves, cumin, fennel, garlic, lavender, marjoram, mint, oregano, parsley, pepper, rosemary, sage, savory, sumac, tarragon, thyme   It's not just a diet, it's a lifestyle:  . The Mediterranean diet includes   lifestyle factors typical of those in the region  . Foods, drinks and meals are best eaten with others and savored . Daily physical activity is important for overall good health . This could be strenuous exercise like running and aerobics . This could also be more leisurely activities such as walking, housework, yard-work, or taking the stairs . Moderation is the key; a balanced and  healthy diet accommodates most foods and drinks . Consider portion sizes and frequency of consumption of certain foods   Meal Ideas & Options:  . Breakfast:  o Whole wheat toast or whole wheat English muffins with peanut butter & hard boiled egg o Steel cut oats topped with apples & cinnamon and skim milk  o Fresh fruit: banana, strawberries, melon, berries, peaches  o Smoothies: strawberries, bananas, greek yogurt, peanut butter o Low fat greek yogurt with blueberries and granola  o Egg white omelet with spinach and mushrooms o Breakfast couscous: whole wheat couscous, apricots, skim milk, cranberries  . Sandwiches:  o Hummus and grilled vegetables (peppers, zucchini, squash) on whole wheat bread   o Grilled chicken on whole wheat pita with lettuce, tomatoes, cucumbers or tzatziki  o Tuna salad on whole wheat bread: tuna salad made with greek yogurt, olives, red peppers, capers, green onions o Garlic rosemary lamb pita: lamb sauted with garlic, rosemary, salt & pepper; add lettuce, cucumber, greek yogurt to pita - flavor with lemon juice and black pepper  . Seafood:  o Mediterranean grilled salmon, seasoned with garlic, basil, parsley, lemon juice and black pepper o Shrimp, lemon, and spinach whole-grain pasta salad made with low fat greek yogurt  o Seared scallops with lemon orzo  o Seared tuna steaks seasoned salt, pepper, coriander topped with tomato mixture of olives, tomatoes, olive oil, minced garlic, parsley, green onions and cappers  . Meats:  o Herbed greek chicken salad with kalamata olives, cucumber, feta  o Red bell peppers stuffed with spinach, bulgur, lean ground beef (or lentils) & topped with feta   o Kebabs: skewers of chicken, tomatoes, onions, zucchini, squash  o [place] burgers: made with red onions, mint, dill, lemon juice, feta cheese topped with roasted red peppers . Vegetarian o Cucumber salad: cucumbers, artichoke hearts, celery, red onion, feta cheese, tossed in  olive oil & lemon juice  o Hummus and whole grain pita points with a greek salad (lettuce, tomato, feta, olives, cucumbers, red onion) o Lentil soup with celery, carrots made with vegetable broth, garlic, salt and pepper  o Tabouli salad: parsley, bulgur, mint, scallions, cucumbers, tomato, radishes, lemon juice, olive oil, salt and pepper.  Reduce sugar and white starch intake. Get back on Lipitor - try taking one half tablet (20 mg) as discussed Let's plan on follow up lipid panel in about 2 months.

## 2016-02-23 ENCOUNTER — Other Ambulatory Visit: Payer: Self-pay | Admitting: Family Medicine

## 2016-02-23 NOTE — Telephone Encounter (Signed)
Not due until 02/27/16.

## 2016-02-25 ENCOUNTER — Other Ambulatory Visit: Payer: Self-pay | Admitting: Family Medicine

## 2016-02-26 NOTE — Telephone Encounter (Signed)
Refill OK with 2 additional refills. 

## 2016-03-09 ENCOUNTER — Telehealth: Payer: Self-pay | Admitting: Family Medicine

## 2016-03-09 NOTE — Telephone Encounter (Signed)
Last OV 02/17/2016 Last refill #90 02/11/2016 Please advise if can refill on 03/11/2016

## 2016-03-09 NOTE — Telephone Encounter (Signed)
Pt said she has a class on Mon 03/14/16 and is asking if she can pick up the script up on Friday 03/11/16      oxyCODONE (ROXICODONE) 15 MG immediate release tablet

## 2016-03-09 NOTE — Telephone Encounter (Signed)
yes

## 2016-03-11 MED ORDER — OXYCODONE HCL 15 MG PO TABS: 15.0000 mg | ORAL_TABLET | Freq: Three times a day (TID) | ORAL | Status: DC | PRN

## 2016-03-11 NOTE — Telephone Encounter (Signed)
Pt is aware that script is up front for pick up.  

## 2016-03-11 NOTE — Telephone Encounter (Signed)
Printed for signature

## 2016-04-05 ENCOUNTER — Telehealth: Payer: Self-pay | Admitting: Family Medicine

## 2016-04-05 NOTE — Telephone Encounter (Signed)
Due for refill 04/10/16.

## 2016-04-05 NOTE — Telephone Encounter (Signed)
Pt need new Rx for Oxycodone

## 2016-04-06 ENCOUNTER — Other Ambulatory Visit: Payer: Self-pay | Admitting: Family Medicine

## 2016-04-06 NOTE — Telephone Encounter (Signed)
yes

## 2016-04-06 NOTE — Telephone Encounter (Signed)
Last OV 02/17/2016 Last refill 03/11/2016 #90 Okay to printed with a refill date of 04/10/2016.  Please advise.

## 2016-04-06 NOTE — Telephone Encounter (Signed)
Script will be printed Friday with a DNF date of 7/2.

## 2016-04-08 ENCOUNTER — Telehealth: Payer: Self-pay | Admitting: Family Medicine

## 2016-04-08 MED ORDER — OXYCODONE HCL 15 MG PO TABS: 15.0000 mg | ORAL_TABLET | Freq: Three times a day (TID) | ORAL | Status: DC | PRN

## 2016-04-08 NOTE — Telephone Encounter (Signed)
Printed, signed, up front for pick up. Pt is aware.

## 2016-04-08 NOTE — Telephone Encounter (Signed)
Pharmacy is aware to fill Saturday.

## 2016-04-08 NOTE — Telephone Encounter (Signed)
Pharmacy would like to know if it is okay to go ahead an fill the Rx for Oxycodone on 04/09/16 for pt?  The script states 04/10/16 but the pharmacy will not be open.

## 2016-04-26 ENCOUNTER — Telehealth: Payer: Self-pay | Admitting: Family Medicine

## 2016-04-26 NOTE — Telephone Encounter (Signed)
Pt need new Rx for Xanax     Pharm:  [facility]

## 2016-04-27 MED ORDER — ALPRAZOLAM 2 MG PO TABS: ORAL_TABLET | ORAL | Status: DC

## 2016-04-27 NOTE — Telephone Encounter (Signed)
Verbally called in for patient.  

## 2016-05-09 ENCOUNTER — Telehealth: Payer: Self-pay | Admitting: Family Medicine

## 2016-05-09 MED ORDER — OXYCODONE HCL 15 MG PO TABS: 15.0000 mg | ORAL_TABLET | Freq: Three times a day (TID) | ORAL | 0 refills | Status: DC | PRN

## 2016-05-09 NOTE — Telephone Encounter (Signed)
Printed for signature

## 2016-05-09 NOTE — Telephone Encounter (Signed)
Pre visit review using our clinic review tool, if applicable. No additional management support is needed unless otherwise documented below in the visit note. 

## 2016-05-09 NOTE — Telephone Encounter (Signed)
Pt is aware that script is up front for pick up.  

## 2016-05-09 NOTE — Telephone Encounter (Signed)
Refill OK

## 2016-05-09 NOTE — Telephone Encounter (Signed)
Last OV 02-17-2016 Last refill 04/08/2016 #90 Please advise if okay to fill

## 2016-05-09 NOTE — Telephone Encounter (Signed)
Pt need new Rx for Oxycodone 05/11/16

## 2016-05-16 ENCOUNTER — Other Ambulatory Visit (INDEPENDENT_AMBULATORY_CARE_PROVIDER_SITE_OTHER): Payer: BLUE CROSS/BLUE SHIELD

## 2016-05-16 DIAGNOSIS — E785 Hyperlipidemia, unspecified: Secondary | ICD-10-CM | POA: Diagnosis not present

## 2016-05-16 DIAGNOSIS — R739 Hyperglycemia, unspecified: Secondary | ICD-10-CM | POA: Diagnosis not present

## 2016-05-16 DIAGNOSIS — R7989 Other specified abnormal findings of blood chemistry: Secondary | ICD-10-CM

## 2016-05-16 LAB — LIPID PANEL
Cholesterol: 309 mg/dL — ABNORMAL HIGH (ref 0–200)
HDL: 43.9 mg/dL (ref >39.00)
Total CHOL/HDL Ratio: 7

## 2016-05-16 LAB — HEMOGLOBIN A1C: Hgb A1c MFr Bld: 5.8 % (ref 4.6–6.5)

## 2016-05-16 LAB — LDL CHOLESTEROL, DIRECT: Direct LDL: 127 mg/dL

## 2016-05-17 ENCOUNTER — Telehealth: Payer: Self-pay | Admitting: Family Medicine

## 2016-05-17 NOTE — Telephone Encounter (Signed)
See results note. 

## 2016-05-17 NOTE — Telephone Encounter (Signed)
Autumn pt called to get lab results.

## 2016-06-07 ENCOUNTER — Telehealth: Payer: Self-pay | Admitting: Family Medicine

## 2016-06-07 NOTE — Telephone Encounter (Signed)
yes

## 2016-06-07 NOTE — Telephone Encounter (Signed)
Last OV 02/17/2016  Last refill 05/09/2016 #90 Okay to refill tomorrow 8/30 since you will not be here 8/31?

## 2016-06-07 NOTE — Telephone Encounter (Signed)
° °  Pt req refill    oxyCODONE (ROXICODONE) 15 MG immediate release tablet   Pt said [facility] will be closed on Sat and will need to pick up Friday

## 2016-06-08 MED ORDER — OXYCODONE HCL 15 MG PO TABS: 15.0000 mg | ORAL_TABLET | Freq: Three times a day (TID) | ORAL | 0 refills | Status: DC | PRN

## 2016-06-08 NOTE — Addendum Note (Signed)
Addended by: Elio Forget on: 06/08/2016 04:18 PM   Modules accepted: Orders

## 2016-06-08 NOTE — Telephone Encounter (Signed)
Pt is aware that script is printed and up front for pick up.

## 2016-06-29 ENCOUNTER — Telehealth: Payer: Self-pay | Admitting: Family Medicine

## 2016-06-29 ENCOUNTER — Telehealth: Payer: Self-pay | Admitting: Emergency Medicine

## 2016-06-29 MED ORDER — ALPRAZOLAM 2 MG PO TABS: ORAL_TABLET | ORAL | 1 refills | Status: DC

## 2016-06-29 NOTE — Telephone Encounter (Signed)
Refill with one additional refill. 

## 2016-06-29 NOTE — Telephone Encounter (Signed)
Pt need refill on alprazolam 2 mg send to [facility]

## 2016-06-29 NOTE — Telephone Encounter (Signed)
Pt requesting refill Alprazolam 2 mg  Last filled 04/27/16 # 75 refill 1   Last OV 02/17/16   Okay to refill?

## 2016-06-29 NOTE — Telephone Encounter (Signed)
Verbally called in for patient.  

## 2016-06-29 NOTE — Telephone Encounter (Signed)
Medication verbally called in for patient. 

## 2016-07-06 ENCOUNTER — Telehealth: Payer: Self-pay | Admitting: Family Medicine

## 2016-07-06 MED ORDER — OXYCODONE HCL 15 MG PO TABS: 15.0000 mg | ORAL_TABLET | Freq: Three times a day (TID) | ORAL | 0 refills | Status: DC | PRN

## 2016-07-06 NOTE — Telephone Encounter (Signed)
Pt is aware that rx will be up front for pick up.

## 2016-07-06 NOTE — Telephone Encounter (Signed)
Pt request refill  °oxyCODONE (ROXICODONE) 15 MG immediate release tablet °

## 2016-07-12 ENCOUNTER — Other Ambulatory Visit: Payer: Self-pay | Admitting: Family Medicine

## 2016-07-12 ENCOUNTER — Telehealth: Payer: Self-pay | Admitting: Family Medicine

## 2016-07-12 NOTE — Telephone Encounter (Signed)
Pt needs refill on sumatriptan 50 mg send to [facility]. Pt does not know how many pills she gets

## 2016-07-12 NOTE — Telephone Encounter (Signed)
Medication sent into the pharmacy. 

## 2016-08-04 ENCOUNTER — Telehealth: Payer: Self-pay | Admitting: Family Medicine

## 2016-08-04 NOTE — Telephone Encounter (Signed)
Last OV 02-17-2016 Last Refill 07/06/2016 #90, 0rf Please advise if okay to printed Monday

## 2016-08-04 NOTE — Telephone Encounter (Signed)
Pt would like to pick, up her oxycodone rx on Monday. Pt is aware md out of office until monday

## 2016-08-07 NOTE — Telephone Encounter (Signed)
OK 

## 2016-08-08 ENCOUNTER — Ambulatory Visit (INDEPENDENT_AMBULATORY_CARE_PROVIDER_SITE_OTHER): Payer: BLUE CROSS/BLUE SHIELD | Admitting: *Deleted

## 2016-08-08 DIAGNOSIS — Z23 Encounter for immunization: Secondary | ICD-10-CM | POA: Diagnosis not present

## 2016-08-08 MED ORDER — OXYCODONE HCL 15 MG PO TABS: 15.0000 mg | ORAL_TABLET | Freq: Three times a day (TID) | ORAL | 0 refills | Status: DC | PRN

## 2016-08-08 NOTE — Telephone Encounter (Signed)
Pt calling to check status of medication.

## 2016-08-08 NOTE — Telephone Encounter (Signed)
Pt is aware that script is ready.

## 2016-08-29 ENCOUNTER — Telehealth: Payer: Self-pay | Admitting: Family Medicine

## 2016-08-29 MED ORDER — ALPRAZOLAM 2 MG PO TABS: ORAL_TABLET | ORAL | 1 refills | Status: DC

## 2016-08-29 NOTE — Telephone Encounter (Signed)
Refill with one additional refill. 

## 2016-08-29 NOTE — Telephone Encounter (Signed)
Pharmacy is aware of instructions.

## 2016-08-29 NOTE — Telephone Encounter (Signed)
Pt request refill  XANAX 2 MG tablet  [facility]

## 2016-08-29 NOTE — Telephone Encounter (Signed)
Last refill 06-29-2016 #75, 1rf Last OV 02/17/2016  Please advise

## 2016-08-29 NOTE — Telephone Encounter (Signed)
Verbally called in for patient.  

## 2016-08-29 NOTE — Telephone Encounter (Signed)
Pharmacy calling to get directions on a Rx did not give name of Rx

## 2016-09-06 ENCOUNTER — Telehealth: Payer: Self-pay | Admitting: Family Medicine

## 2016-09-06 NOTE — Telephone Encounter (Signed)
Refill OK.  Would get office visit before prescribing Chantix to discuss possible side effects first.

## 2016-09-06 NOTE — Telephone Encounter (Signed)
Pt needs new rx oxycodone. Pt also would like  rx for chantix for smoking

## 2016-09-06 NOTE — Telephone Encounter (Signed)
Last OV 02-17-2016 Last OV was 08/08/2016  Please advise on refill and chantix

## 2016-09-07 MED ORDER — OXYCODONE HCL 15 MG PO TABS: 15.0000 mg | ORAL_TABLET | Freq: Three times a day (TID) | ORAL | 0 refills | Status: DC | PRN

## 2016-09-07 NOTE — Telephone Encounter (Signed)
Pt is aware that script is up front for pick up and that she needs an appt in order to get chantix.

## 2016-10-05 ENCOUNTER — Telehealth: Payer: Self-pay | Admitting: Family Medicine

## 2016-10-05 MED ORDER — OXYCODONE HCL 15 MG PO TABS: 15.0000 mg | ORAL_TABLET | Freq: Three times a day (TID) | ORAL | 0 refills | Status: DC | PRN

## 2016-10-05 NOTE — Telephone Encounter (Signed)
Last refill 09-07-2016 #30 Last OV 02-17-2016 Please advise if can put a DNF date of 10/07/2016 due to your absence.

## 2016-10-05 NOTE — Telephone Encounter (Signed)
Refill date of 10-07-16

## 2016-10-05 NOTE — Telephone Encounter (Signed)
Pt is aware that script is up front for pick up.  

## 2016-10-05 NOTE — Telephone Encounter (Signed)
Pt request refill  °oxyCODONE (ROXICODONE) 15 MG immediate release tablet °

## 2016-10-27 ENCOUNTER — Other Ambulatory Visit: Payer: Self-pay | Admitting: Family Medicine

## 2016-11-01 ENCOUNTER — Telehealth: Payer: Self-pay | Admitting: Family Medicine

## 2016-11-01 NOTE — Telephone Encounter (Signed)
Okay to printed Friday with a DNF date of 11/05/2016?

## 2016-11-01 NOTE — Telephone Encounter (Signed)
OK.  Needs office follow up within 1-2 months.

## 2016-11-01 NOTE — Telephone Encounter (Signed)
Pt request refill   oxyCODONE (ROXICODONE) 15 MG immediate release tablet  Pt would like to fill on sat/ 12/27.  She states due to 31 days in past month.

## 2016-11-04 MED ORDER — OXYCODONE HCL 15 MG PO TABS: 15.0000 mg | ORAL_TABLET | Freq: Three times a day (TID) | ORAL | 0 refills | Status: DC | PRN

## 2016-11-04 NOTE — Telephone Encounter (Signed)
Printed for signature

## 2016-11-04 NOTE — Telephone Encounter (Signed)
Pt is aware that script is up front for pick up.  

## 2016-11-21 ENCOUNTER — Other Ambulatory Visit: Payer: Self-pay | Admitting: Family Medicine

## 2016-12-05 ENCOUNTER — Telehealth: Payer: Self-pay | Admitting: Family Medicine

## 2016-12-05 MED ORDER — OXYCODONE HCL 15 MG PO TABS: 15.0000 mg | ORAL_TABLET | Freq: Three times a day (TID) | ORAL | 0 refills | Status: DC | PRN

## 2016-12-05 NOTE — Telephone Encounter (Signed)
Pt need new Rx for Oxycodone  Pt is aware of 3 business day for refills

## 2016-12-05 NOTE — Telephone Encounter (Signed)
Pt is aware that script is up front for pick up.  

## 2016-12-22 ENCOUNTER — Telehealth: Payer: Self-pay | Admitting: Family Medicine

## 2016-12-22 NOTE — Telephone Encounter (Signed)
Last refill 11/28/16.  Last office visit 02/17/16.  Okay to fill?

## 2016-12-23 NOTE — Telephone Encounter (Signed)
Left message on machine for patient to return our call 

## 2016-12-23 NOTE — Telephone Encounter (Signed)
She needs follow up.  We will discuss tapering back then. Refill for one month and make sure she has follow up within one month

## 2016-12-27 ENCOUNTER — Telehealth: Payer: Self-pay | Admitting: Family Medicine

## 2016-12-27 NOTE — Telephone Encounter (Signed)
Left message on machine for patient.  Patient needs a follow up visit scheduled before refill can be called in.

## 2016-12-27 NOTE — Telephone Encounter (Signed)
Pt need new Rx for Xanax   Pharm:  [facility]

## 2016-12-29 NOTE — Telephone Encounter (Signed)
Pt has scheduled appointment for Monday, 3/26.   Would like to know if you can go ahead and send in the Xanax.  [address]

## 2016-12-29 NOTE — Telephone Encounter (Signed)
Duplicate message. 

## 2016-12-30 NOTE — Telephone Encounter (Signed)
Rx called in 

## 2017-01-02 ENCOUNTER — Encounter: Payer: Self-pay | Admitting: Family Medicine

## 2017-01-02 ENCOUNTER — Ambulatory Visit (INDEPENDENT_AMBULATORY_CARE_PROVIDER_SITE_OTHER): Payer: BLUE CROSS/BLUE SHIELD | Admitting: Family Medicine

## 2017-01-02 ENCOUNTER — Telehealth: Payer: Self-pay | Admitting: Family Medicine

## 2017-01-02 VITALS — BP 120/80 | HR 83 | Temp 97.8°F | Wt 149.6 lb

## 2017-01-02 DIAGNOSIS — F410 Panic disorder [episodic paroxysmal anxiety] without agoraphobia: Secondary | ICD-10-CM | POA: Diagnosis not present

## 2017-01-02 DIAGNOSIS — M545 Low back pain: Secondary | ICD-10-CM

## 2017-01-02 DIAGNOSIS — J069 Acute upper respiratory infection, unspecified: Secondary | ICD-10-CM

## 2017-01-02 DIAGNOSIS — B9789 Other viral agents as the cause of diseases classified elsewhere: Secondary | ICD-10-CM | POA: Diagnosis not present

## 2017-01-02 DIAGNOSIS — G8929 Other chronic pain: Secondary | ICD-10-CM | POA: Diagnosis not present

## 2017-01-02 DIAGNOSIS — F419 Anxiety disorder, unspecified: Secondary | ICD-10-CM | POA: Insufficient documentation

## 2017-01-02 DIAGNOSIS — F411 Generalized anxiety disorder: Secondary | ICD-10-CM

## 2017-01-02 MED ORDER — ALPRAZOLAM 1 MG PO TABS: 1.0000 mg | ORAL_TABLET | Freq: Three times a day (TID) | ORAL | 0 refills | Status: DC

## 2017-01-02 MED ORDER — OXYCODONE HCL 15 MG PO TABS: 15.0000 mg | ORAL_TABLET | Freq: Three times a day (TID) | ORAL | 0 refills | Status: DC | PRN

## 2017-01-02 MED ORDER — BENZONATATE 200 MG PO CAPS: 200.0000 mg | ORAL_CAPSULE | Freq: Three times a day (TID) | ORAL | 0 refills | Status: DC | PRN

## 2017-01-02 NOTE — Progress Notes (Signed)
Subjective:     Patient ID: [name], female   DOB: 1961/08/19, 56 y.o.   MRN: [id number]  HPI Patient is here for following issues  Acute issue of couple day history of nasal congestion and cough. She has long-term history of smoking. No fevers or chills. She's had mild throat irritation. Cough mostly nonproductive. No dyspnea. No obvious wheezing. Denies any nausea or vomiting.  Patient has long-standing history of chronic back pain. She takes Oxicodone has been on this for many years and came to [place] on this medication. Even with oxycodone she has poor pain control at times. We had offered referral to pain management previously which she declined. She continues to have low back pain which limits her activities at times. Denies any focal weakness.  Denies any constipation or other side effects  She has long-standing history of anxiety and was placed by another physician years ago on alprazolam and currently takes 1 mg morning and 2 mg at lunch and 2 mg at bedtime. She is here today to discuss possible tapering. We expressed our concerns in the past regarding combination with chronic opioid and benzodiazepine and also that she does not have any clear indication for ongoing use of this medication.  She has tried SSRIs in the past such as Zoloft but states she did not "like the way they made her feel ".  Her husband is currently admitted with brain abscess and this has caused her great stress  Past Medical History:  Diagnosis Date  . ASTHMA UNSPECIFIED WITH EXACERBATION 07/21/2010  . CARPAL TUNNEL SYNDROME, BILATERAL 01/16/2008  . COPD 05/08/2008  . GERD 04/18/2007  . HELICOBACTER PYLORI GASTRITIS, HX OF 04/18/2007  . PANIC DISORDER 04/18/2007  . TOBACCO ABUSE 07/17/2009   Past Surgical History:  Procedure Laterality Date  . APPENDECTOMY    . OOPHORECTOMY      reports that she has been smoking Cigarettes.  She has a 50.00 pack-year smoking history. She has never used smokeless tobacco. She  reports that she does not drink alcohol or use drugs. family history includes Breast cancer in her paternal aunt; Cancer in her father; Clotting disorder in her brother; Diabetes in her mother; Heart disease in her father; Stomach cancer in her paternal uncle. Allergies  Allergen Reactions  . Aspirin     REACTION: had h. pylori due to too many goody powders. told not to take any more asa     Past Medical History:  Diagnosis Date  . ASTHMA UNSPECIFIED WITH EXACERBATION 07/21/2010  . CARPAL TUNNEL SYNDROME, BILATERAL 01/16/2008  . COPD 05/08/2008  . GERD 04/18/2007  . HELICOBACTER PYLORI GASTRITIS, HX OF 04/18/2007  . PANIC DISORDER 04/18/2007  . TOBACCO ABUSE 07/17/2009   Past Surgical History:  Procedure Laterality Date  . APPENDECTOMY    . OOPHORECTOMY      reports that she has been smoking Cigarettes.  She has a 50.00 pack-year smoking history. She has never used smokeless tobacco. She reports that she does not drink alcohol or use drugs. family history includes Breast cancer in her paternal aunt; Cancer in her father; Clotting disorder in her brother; Diabetes in her mother; Heart disease in her father; Stomach cancer in her paternal uncle. Allergies  Allergen Reactions  . Aspirin     REACTION: had h. pylori due to too many goody powders. told not to take any more asa     Review of Systems  Constitutional: Negative for chills and fever.  HENT: Positive for congestion and  sinus pressure.   Respiratory: Positive for cough. Negative for shortness of breath.   Endocrine: Negative for polydipsia and polyuria.  Genitourinary: Negative for dysuria.  Musculoskeletal: Positive for back pain.  Neurological: Negative for dizziness.  Psychiatric/Behavioral: The patient is nervous/anxious.        Objective:   Physical Exam  Constitutional: She appears well-developed and well-nourished.  HENT:  Right Ear: External ear normal.  Left Ear: External ear normal.  Mouth/Throat: Oropharynx is  clear and moist.  Eyes: Pupils are equal, round, and reactive to light.  Neck: Neck supple. No JVD present. No thyromegaly present.  Cardiovascular: Normal rate and regular rhythm.  Exam reveals no gallop.   Pulmonary/Chest: Effort normal. No respiratory distress. She has no wheezes. She has no rales.  Slightly diminished breath sounds throughout but no rales or wheezes.  Musculoskeletal: She exhibits no edema.  Neurological: She is alert.  Psychiatric: She has a normal mood and affect. Her behavior is normal.       Assessment:     #1 probable viral URI with cough  #2 chronic low back pain  # 3 history of chronic anxiety    Plan:     -We discussed at length our concerns regarding combination with benzodiazepine and opioid and strongly advocate that she taper off benzos as she has no chronic clear indications to be on this. She is currently been on 2 mg and we wrote for 1 mg and she will take 1 mg 3 times a day for 2 weeks and then start slow tapering off with regimen given to patient. -Reassess in 2 months. -We discussed nonpharmacologic ways of managing anxiety and offered behavioral health intervention which she is not interested in at this time. -We'll discuss at follow-up possible slowly tapering back oxicodone as well -We discussed our concerns that she may be experiencing some hyperalgesia from chronic opioid -Tessalon Perles 200 mg every 8 hours as needed for cough and follow up promptly for any fever or increasing shortness of breath  Eulas Post MD [facility] at [facility]

## 2017-01-02 NOTE — Telephone Encounter (Signed)
Go ahead and start Doxycycline 100 mg po bid for 10 days.

## 2017-01-02 NOTE — Patient Instructions (Addendum)
Reduce the Xanax to one three times daily for 2 weeks and then reduce the Xanax by one half tablet every week until off.

## 2017-01-02 NOTE — Telephone Encounter (Signed)
Pt stated that she is feeling worse from the time in which she was in the office and she stated to Dr. Elease Hashimoto that she is feel she has a sinus infection and would call back if she is not feeling any better.Marland Kitchen   Pharm:  [facility].

## 2017-01-02 NOTE — Progress Notes (Signed)
Pre visit review using our clinic review tool, if applicable. No additional management support is needed unless otherwise documented below in the visit note. 

## 2017-01-03 MED ORDER — DOXYCYCLINE HYCLATE 100 MG PO TABS: 100.0000 mg | ORAL_TABLET | Freq: Two times a day (BID) | ORAL | 0 refills | Status: DC

## 2017-01-03 NOTE — Telephone Encounter (Signed)
Rx sent and patient is aware. 

## 2017-01-30 ENCOUNTER — Telehealth: Payer: Self-pay | Admitting: Family Medicine

## 2017-01-30 MED ORDER — OXYCODONE HCL 15 MG PO TABS: 15.0000 mg | ORAL_TABLET | Freq: Three times a day (TID) | ORAL | 0 refills | Status: DC | PRN

## 2017-01-30 NOTE — Telephone Encounter (Signed)
Refill OK

## 2017-01-30 NOTE — Telephone Encounter (Signed)
oxyCODONE (ROXICODONE) 15 MG immediate release tablet last refill and office visit 01/02/17.  We discussed at length our concerns regarding combination with benzodiazepine and opioid and strongly advocate that she taper off benzos as she has no chronic clear indications to be on this. She is currently been on 2 mg and we wrote for 1 mg and she will take 1 mg 3 times a day for 2 weeks and then start slow tapering off with regimen given to patient.  Okay to refill?

## 2017-01-30 NOTE — Telephone Encounter (Signed)
Pt request refill  °oxyCODONE (ROXICODONE) 15 MG immediate release tablet °

## 2017-01-30 NOTE — Telephone Encounter (Signed)
Left message on machine for patient Rx ready for pick up 

## 2017-02-10 ENCOUNTER — Encounter: Payer: Self-pay | Admitting: Family Medicine

## 2017-02-10 ENCOUNTER — Ambulatory Visit (INDEPENDENT_AMBULATORY_CARE_PROVIDER_SITE_OTHER): Payer: BLUE CROSS/BLUE SHIELD | Admitting: Family Medicine

## 2017-02-10 VITALS — BP 102/80 | HR 89 | Temp 97.7°F | Ht 67.0 in | Wt 146.6 lb

## 2017-02-10 DIAGNOSIS — J441 Chronic obstructive pulmonary disease with (acute) exacerbation: Secondary | ICD-10-CM | POA: Diagnosis not present

## 2017-02-10 DIAGNOSIS — F17200 Nicotine dependence, unspecified, uncomplicated: Secondary | ICD-10-CM

## 2017-02-10 MED ORDER — METHYLPREDNISOLONE ACETATE 80 MG/ML IJ SUSP
80.0000 mg | Freq: Once | INTRAMUSCULAR | Status: AC
  Administered 2017-02-10: 80 mg via INTRAMUSCULAR

## 2017-02-10 MED ORDER — ALBUTEROL SULFATE HFA 108 (90 BASE) MCG/ACT IN AERS: 2.0000 | INHALATION_SPRAY | RESPIRATORY_TRACT | 1 refills | Status: DC | PRN

## 2017-02-10 NOTE — Progress Notes (Signed)
Subjective:     Patient ID: [name], female   DOB: [birth year], 56 y.o.   MRN: [id number]  HPI Patient is here with cough and increased wheezing over the past few days. She has COPD and did some mowing last Saturday and symptoms started soon afterwards. She has cough which is mostly nonproductive. Increased wheezing. She had rescue inhaler which ran out couple days ago. She is using Qvar. No fever. No respiratory distress. She has not tolerated oral prednisone in the past very well secondary to anxiousness. Requesting intramuscular steroids. No history of diabetes.  Wheezing is moderate.  Dyspnea worse with activity.  No chest pains.  Past Medical History:  Diagnosis Date  . ASTHMA UNSPECIFIED WITH EXACERBATION 07/21/2010  . CARPAL TUNNEL SYNDROME, BILATERAL 01/16/2008  . COPD 05/08/2008  . GERD 04/18/2007  . HELICOBACTER PYLORI GASTRITIS, HX OF 04/18/2007  . PANIC DISORDER 04/18/2007  . TOBACCO ABUSE 07/17/2009   Past Surgical History:  Procedure Laterality Date  . APPENDECTOMY    . OOPHORECTOMY      reports that she has been smoking Cigarettes.  She has a 50.00 pack-year smoking history. She has never used smokeless tobacco. She reports that she does not drink alcohol or use drugs. family history includes Breast cancer in her paternal aunt; Cancer in her father; Clotting disorder in her brother; Diabetes in her mother; Heart disease in her father; Stomach cancer in her paternal uncle. Allergies  Allergen Reactions  . Aspirin     REACTION: had h. pylori due to too many goody powders. told not to take any more asa     Review of Systems  Constitutional: Negative for chills and fever.  HENT: Positive for congestion.   Respiratory: Positive for cough, shortness of breath and wheezing.   Cardiovascular: Negative for chest pain and leg swelling.       Objective:   Physical Exam  Constitutional: She appears well-developed and well-nourished.  HENT:  Right Ear: External ear normal.   Left Ear: External ear normal.  Mouth/Throat: Oropharynx is clear and moist.  Cardiovascular: Normal rate and regular rhythm.   Pulmonary/Chest: Effort normal. No respiratory distress. She has wheezes. She has no rales.  Musculoskeletal: She exhibits no edema.       Assessment:     COPD with acute exacerbation probably related to pollen allergy/inhalation    Plan:     -Depo-Medrol 80 mg IM given -Stay out of pollen is much as possible until things settle down -Refill albuterol to use as needed for cough and wheeze -Continue with Qvar twice daily -Follow-up promptly for any fever or increasing shortness of breath  Eulas Post MD [facility] at [facility]

## 2017-02-10 NOTE — Progress Notes (Signed)
Pre visit review using our clinic review tool, if applicable. No additional management support is needed unless otherwise documented below in the visit note. 

## 2017-02-10 NOTE — Patient Instructions (Signed)
Follow up for any fever or increasing shortness of breath. 

## 2017-02-13 ENCOUNTER — Telehealth: Payer: Self-pay | Admitting: Family Medicine

## 2017-02-13 NOTE — Telephone Encounter (Signed)
Spoke with patient and re-educated her about use of inhaler.

## 2017-02-13 NOTE — Telephone Encounter (Signed)
Proair and Ventolin are both Albuterol.  Should not see much difference between the two.

## 2017-02-13 NOTE — Telephone Encounter (Signed)
Pt would like to have ventolin HFA and the proair is not working for her breathing.  Pharm:[facility]  Pt would like to have a call.

## 2017-02-14 ENCOUNTER — Telehealth: Payer: Self-pay | Admitting: Family Medicine

## 2017-02-14 MED ORDER — CEFUROXIME AXETIL 250 MG PO TABS: 250.0000 mg | ORAL_TABLET | Freq: Two times a day (BID) | ORAL | 0 refills | Status: DC

## 2017-02-14 NOTE — Telephone Encounter (Signed)
° ° ° °  Pt call to say she think she has bronchitis and is asking if a antibiotic can be called in for her. She said she was  [name] in  here on Friday 02/10/17  [phone]     [place]

## 2017-02-14 NOTE — Telephone Encounter (Signed)
Rx sent and Left message on machine for patient. 

## 2017-02-14 NOTE — Telephone Encounter (Signed)
ceftin 250 mg po bid for 7 days.

## 2017-02-27 ENCOUNTER — Telehealth: Payer: Self-pay | Admitting: Family Medicine

## 2017-02-27 NOTE — Telephone Encounter (Signed)
OK to refill by then.

## 2017-02-27 NOTE — Telephone Encounter (Signed)
Last refill 01/30/17 and last office visit 02/10/17.  Okay to fill?

## 2017-02-27 NOTE — Telephone Encounter (Signed)
Pt needs new rx oxycodone by this wednesday

## 2017-03-01 MED ORDER — OXYCODONE HCL 15 MG PO TABS: 15.0000 mg | ORAL_TABLET | Freq: Three times a day (TID) | ORAL | 0 refills | Status: DC | PRN

## 2017-03-01 NOTE — Telephone Encounter (Signed)
Left message on machine for patient Rx ready for pick up 

## 2017-03-02 ENCOUNTER — Telehealth: Payer: Self-pay | Admitting: Family Medicine

## 2017-03-02 MED ORDER — ALPRAZOLAM 1 MG PO TABS
1.0000 mg | ORAL_TABLET | Freq: Three times a day (TID) | ORAL | 1 refills | Status: DC
Start: 2017-03-02 — End: 2017-04-25

## 2017-03-02 NOTE — Telephone Encounter (Signed)
Rx done. 

## 2017-03-02 NOTE — Telephone Encounter (Signed)
lasr rx given on 3/26 for #90

## 2017-03-02 NOTE — Telephone Encounter (Signed)
Pt has new pharm [facility]. Pt needs alprazolam 1 mg #90 w/refill

## 2017-03-02 NOTE — Telephone Encounter (Signed)
Refill OK with one additional refill. 

## 2017-03-21 ENCOUNTER — Ambulatory Visit (INDEPENDENT_AMBULATORY_CARE_PROVIDER_SITE_OTHER): Payer: BLUE CROSS/BLUE SHIELD | Admitting: Adult Health

## 2017-03-21 ENCOUNTER — Encounter: Payer: Self-pay | Admitting: Adult Health

## 2017-03-21 VITALS — BP 140/80 | Temp 98.1°F | Ht 67.0 in | Wt 142.9 lb

## 2017-03-21 DIAGNOSIS — J441 Chronic obstructive pulmonary disease with (acute) exacerbation: Secondary | ICD-10-CM | POA: Diagnosis not present

## 2017-03-21 MED ORDER — BENZONATATE 200 MG PO CAPS: 200.0000 mg | ORAL_CAPSULE | Freq: Three times a day (TID) | ORAL | 1 refills | Status: DC | PRN

## 2017-03-21 MED ORDER — PREDNISONE 10 MG PO TABS: ORAL_TABLET | ORAL | 0 refills | Status: DC

## 2017-03-21 MED ORDER — AZITHROMYCIN 250 MG PO TABS: ORAL_TABLET | ORAL | 0 refills | Status: DC

## 2017-03-21 NOTE — Progress Notes (Signed)
Subjective:    Patient ID: [name], female    DOB: 1961-01-11, 56 y.o.   MRN: [id number]  HPI  56 year old female who  has a past medical history of ASTHMA UNSPECIFIED WITH EXACERBATION (07/21/2010); CARPAL TUNNEL SYNDROME, BILATERAL (01/16/2008); COPD (05/08/2008); GERD (04/18/2007); HELICOBACTER PYLORI GASTRITIS, HX OF (04/18/2007); PANIC DISORDER (04/18/2007); and TOBACCO ABUSE (07/17/2009).  She is a patient of Dr. Elease Hashimoto, who I am seeing today for the first time for COPD exacerbation. She last saw Dr. Elease Hashimoto on 02/10/2017 and was given an IM injection of prednisone. She reports that this helped but " I haven't got rid of it yet."   She continues to feel SOB and have a productive cough with green sputum.   She continues to smoke cigarettes and has a 50 pack year smoking history  She denies any fevers. She is using her inhalers as directed.   Review of Systems See HPI   Past Medical History:  Diagnosis Date  . ASTHMA UNSPECIFIED WITH EXACERBATION 07/21/2010  . CARPAL TUNNEL SYNDROME, BILATERAL 01/16/2008  . COPD 05/08/2008  . GERD 04/18/2007  . HELICOBACTER PYLORI GASTRITIS, HX OF 04/18/2007  . PANIC DISORDER 04/18/2007  . TOBACCO ABUSE 07/17/2009    Social History   Social History  . Marital status: Divorced    Spouse name: N/A  . Number of children: 1  . Years of education: N/A   Occupational History  . CNA Self-Employed   Social History Main Topics  . Smoking status: Current Every Day Smoker    Packs/day: 1.00    Years: 50.00    Types: Cigarettes  . Smokeless tobacco: Never Used     Comment: Trying nicotine patches, made her nauseous at first, going to cut patches in half.  . Alcohol use No  . Drug use: No  . Sexual activity: Not on file   Other Topics Concern  . Not on file   Social History Narrative   lives with bf    cna cares of elderly person   Self employed    Long term smoker    Past Surgical History:  Procedure Laterality Date  . APPENDECTOMY      . OOPHORECTOMY      Family History  Problem Relation Age of Onset  . Diabetes Mother   . Cancer Father        lung smoke   . Heart disease Father   . Clotting disorder Brother   . Breast cancer Paternal Aunt   . Stomach cancer Paternal Uncle     Allergies  Allergen Reactions  . Aspirin     REACTION: had h. pylori due to too many goody powders. told not to take any more asa    Current Outpatient Prescriptions on File Prior to Visit  Medication Sig Dispense Refill  . albuterol (PROVENTIL HFA;VENTOLIN HFA) 108 (90 Base) MCG/ACT inhaler Inhale 2 puffs into the lungs every 4 (four) hours as needed for wheezing or shortness of breath. 1 Inhaler 1  . Albuterol Sulfate (PROAIR RESPICLICK) 818 (90 Base) MCG/ACT AEPB Inhale 2 puffs into the lungs every 4 (four) hours as needed. 1 each 1  . ALPRAZolam (XANAX) 1 MG tablet Take 1 tablet (1 mg total) by mouth 3 (three) times daily. 90 tablet 1  . beclomethasone (QVAR) 40 MCG/ACT inhaler Inhale 2 puffs into the lungs 2 (two) times daily. 1 Inhaler 11  . omeprazole (PRILOSEC) 20 MG capsule TAKE ONE CAPSULE BY MOUTH EVERY DAY 90 capsule  1  . oxyCODONE (ROXICODONE) 15 MG immediate release tablet Take 1 tablet (15 mg total) by mouth every 8 (eight) hours as needed. 90 tablet 0  . SUMAtriptan (IMITREX) 50 MG tablet TAKE 1 TABLET EVERY 2 HOURS AS NEEDED FOR MIGRAINE MAY REPEAT IN 2 HRS IF HEADACHE PERSISTS 10 tablet 2   No current facility-administered medications on file prior to visit.     BP 140/80 (BP Location: Left Arm, Patient Position: Sitting, Cuff Size: Small)   Temp 98.1 F (36.7 C) (Oral)   Ht 5\' 7"  (1.702 m)   Wt 142 lb 14.4 oz (64.8 kg)   BMI 22.38 kg/m       Objective:   Physical Exam  Constitutional: She is oriented to person, place, and time. She appears well-developed and well-nourished. No distress.  Cardiovascular: Normal rate, regular rhythm, normal heart sounds and intact distal pulses.  Exam reveals no gallop and no  friction rub.   No murmur heard. Pulmonary/Chest: No respiratory distress. She has wheezes (bilateral bases ). She has no rales. She exhibits no tenderness.  Neurological: She is alert and oriented to person, place, and time.  Skin: Skin is warm and dry. No rash noted. She is not diaphoretic. No erythema. No pallor.  Psychiatric: She has a normal mood and affect. Her behavior is normal. Judgment and thought content normal.  Vitals reviewed.     Assessment & Plan:  1. COPD exacerbation ([place]) - DG Chest 2 View; Future - benzonatate (TESSALON) 200 MG capsule; Take 1 capsule (200 mg total) by mouth 3 (three) times daily as needed for cough.  Dispense: 20 capsule; Refill: 1 - predniSONE (DELTASONE) 10 MG tablet; 40 mg x 3 days, 20 mg x 3 days, 10 mg x 3 days  Dispense: 21 tablet; Refill: 0 - azithromycin (ZITHROMAX Z-PAK) 250 MG tablet; Take 2 tablets on Day 1.  Then take 1 tablet daily.  Dispense: 6 tablet; Refill: 0 - Follow up if no improvement   Dorothyann Peng, NP

## 2017-03-21 NOTE — Addendum Note (Signed)
Addended by: Sandria Bales B on: 03/21/2017 04:58 PM   Modules accepted: Orders

## 2017-03-22 ENCOUNTER — Ambulatory Visit (INDEPENDENT_AMBULATORY_CARE_PROVIDER_SITE_OTHER)
Admission: RE | Admit: 2017-03-22 | Discharge: 2017-03-22 | Disposition: A | Discharge status: Home / Self Care | Payer: BLUE CROSS/BLUE SHIELD | Source: Ambulatory Visit | Attending: Adult Health | Admitting: Adult Health

## 2017-03-22 ENCOUNTER — Ambulatory Visit: Payer: BLUE CROSS/BLUE SHIELD | Admitting: Family Medicine

## 2017-03-22 DIAGNOSIS — J441 Chronic obstructive pulmonary disease with (acute) exacerbation: Secondary | ICD-10-CM

## 2017-03-22 DIAGNOSIS — R05 Cough: Secondary | ICD-10-CM | POA: Diagnosis not present

## 2017-03-22 DIAGNOSIS — R0602 Shortness of breath: Secondary | ICD-10-CM | POA: Diagnosis not present

## 2017-03-28 ENCOUNTER — Telehealth: Payer: Self-pay | Admitting: Family Medicine

## 2017-03-28 ENCOUNTER — Other Ambulatory Visit: Payer: Self-pay | Admitting: Family Medicine

## 2017-03-28 NOTE — Telephone Encounter (Signed)
Last refill 03/01/17.  Last office visit 02/10/17.  At office visit 01/02/17 - We'll discuss at follow-up possible slowly tapering back oxicodone as well  patient has an appointment scheduled for 04/05/17.  Okay to fill?

## 2017-03-28 NOTE — Telephone Encounter (Signed)
Pt would like new rx oxycodone and would like to pick up this thursday

## 2017-03-28 NOTE — Telephone Encounter (Signed)
Refill OK by Thursday.  Her husband just died so will not try to taper quite yet.

## 2017-03-31 MED ORDER — OXYCODONE HCL 15 MG PO TABS: 15.0000 mg | ORAL_TABLET | Freq: Three times a day (TID) | ORAL | 0 refills | Status: DC | PRN

## 2017-03-31 NOTE — Telephone Encounter (Signed)
Rx ready for pick up and patient is aware 

## 2017-03-31 NOTE — Telephone Encounter (Signed)
Pt is leaving for beach today and would like to pick up this morning - please call pt when ready. Thanks!

## 2017-04-05 ENCOUNTER — Ambulatory Visit: Payer: BLUE CROSS/BLUE SHIELD | Admitting: Family Medicine

## 2017-04-07 ENCOUNTER — Ambulatory Visit (INDEPENDENT_AMBULATORY_CARE_PROVIDER_SITE_OTHER): Payer: BLUE CROSS/BLUE SHIELD | Admitting: Family Medicine

## 2017-04-07 ENCOUNTER — Encounter: Payer: Self-pay | Admitting: Family Medicine

## 2017-04-07 VITALS — BP 120/80 | HR 87 | Temp 98.1°F | Wt 140.6 lb

## 2017-04-07 DIAGNOSIS — F4320 Adjustment disorder, unspecified: Secondary | ICD-10-CM

## 2017-04-07 DIAGNOSIS — R634 Abnormal weight loss: Secondary | ICD-10-CM | POA: Diagnosis not present

## 2017-04-07 DIAGNOSIS — R739 Hyperglycemia, unspecified: Secondary | ICD-10-CM

## 2017-04-07 DIAGNOSIS — F4321 Adjustment disorder with depressed mood: Secondary | ICD-10-CM

## 2017-04-07 DIAGNOSIS — E785 Hyperlipidemia, unspecified: Secondary | ICD-10-CM

## 2017-04-07 LAB — LDL CHOLESTEROL, DIRECT: Direct LDL: 135 mg/dL

## 2017-04-07 LAB — HEMOGLOBIN A1C: HEMOGLOBIN A1C: 6 % (ref 4.6–6.5)

## 2017-04-07 LAB — HEPATIC FUNCTION PANEL
ALBUMIN: 3.9 g/dL (ref 3.5–5.2)
ALT: 21 U/L (ref 0–35)
AST: 14 U/L (ref 0–37)
Alkaline Phosphatase: 78 U/L (ref 39–117)
Bilirubin, Direct: 0.1 mg/dL (ref 0.0–0.3)
TOTAL PROTEIN: 7.2 g/dL (ref 6.0–8.3)
Total Bilirubin: 0.4 mg/dL (ref 0.2–1.2)

## 2017-04-07 LAB — BASIC METABOLIC PANEL
BUN: 13 mg/dL (ref 6–23)
CALCIUM: 9.5 mg/dL (ref 8.4–10.5)
CO2: 32 mEq/L (ref 19–32)
Chloride: 101 mEq/L (ref 96–112)
Creatinine, Ser: 0.73 mg/dL (ref 0.40–1.20)
GFR: 87.09 mL/min (ref >60.00)
GLUCOSE: 100 mg/dL — AB (ref 70–99)
Potassium: 4.4 mEq/L (ref 3.5–5.1)
SODIUM: 138 meq/L (ref 135–145)

## 2017-04-07 LAB — CBC WITH DIFFERENTIAL/PLATELET
BASOS PCT: 0.9 % (ref 0.0–3.0)
Basophils Absolute: 0.1 10*3/uL (ref 0.0–0.1)
EOS PCT: 1.7 % (ref 0.0–5.0)
Eosinophils Absolute: 0.1 10*3/uL (ref 0.0–0.7)
HCT: 39.7 % (ref 36.0–46.0)
HEMOGLOBIN: 13.4 g/dL (ref 12.0–15.0)
Lymphocytes Relative: 25.5 % (ref 12.0–46.0)
Lymphs Abs: 2 10*3/uL (ref 0.7–4.0)
MCHC: 33.7 g/dL (ref 30.0–36.0)
MCV: 95.9 fl (ref 78.0–100.0)
MONO ABS: 0.4 10*3/uL (ref 0.1–1.0)
Monocytes Relative: 5.3 % (ref 3.0–12.0)
Neutro Abs: 5.3 10*3/uL (ref 1.4–7.7)
Neutrophils Relative %: 66.6 % (ref 43.0–77.0)
Platelets: 262 10*3/uL (ref 150.0–400.0)
RBC: 4.14 Mil/uL (ref 3.87–5.11)
RDW: 14.3 % (ref 11.5–15.5)
WBC: 8 10*3/uL (ref 4.0–10.5)

## 2017-04-07 LAB — LIPID PANEL
Cholesterol: 276 mg/dL — ABNORMAL HIGH (ref 0–200)
HDL: 58.7 mg/dL (ref >39.00)
NonHDL: 217.2
Total CHOL/HDL Ratio: 5
Triglycerides: 307 mg/dL — ABNORMAL HIGH (ref 0.0–149.0)
VLDL: 61.4 mg/dL — ABNORMAL HIGH (ref 0.0–40.0)

## 2017-04-07 LAB — TSH: TSH: 1.93 u[IU]/mL (ref 0.35–4.50)

## 2017-04-07 NOTE — Progress Notes (Signed)
Subjective:     Patient ID: [name], female   DOB: [birth year], 56 y.o.   MRN: [id number]  HPI Patient here to discuss grief reaction. Her boyfriend of 5 years died of complications of fungal infection of the brain. He had history of alcohol abuse and alcoholic cirrhosis. He died 22-Mar-2023. Patient is considering hospice counseling. Overall, she feels she is coping fairly well. She is not interested in more medication at this time. She has had chronic anxiety and already takes alprazolam and is trying to taper back.  Smokes one half pack cigarettes per day and try to taper off. She has resumed working.  Other medical problems include history of COPD. Recent exacerbation which is finally improved some. She had chest x-ray which showed no acute findings. She has history of severe hyperlipidemia. She is on atorvastatin for that. No history of CAD or peripheral vascular disease. She has had some weight loss which she attributes to stress of his illness.  Past Medical History:  Diagnosis Date  . ASTHMA UNSPECIFIED WITH EXACERBATION 07/21/2010  . CARPAL TUNNEL SYNDROME, BILATERAL 01/16/2008  . COPD 05/08/2008  . GERD 04/18/2007  . HELICOBACTER PYLORI GASTRITIS, HX OF 04/18/2007  . PANIC DISORDER 04/18/2007  . TOBACCO ABUSE 07/17/2009   Past Surgical History:  Procedure Laterality Date  . APPENDECTOMY    . OOPHORECTOMY      reports that she has been smoking Cigarettes.  She has a 50.00 pack-year smoking history. She has never used smokeless tobacco. She reports that she does not drink alcohol or use drugs. family history includes Breast cancer in her paternal aunt; Cancer in her father; Clotting disorder in her brother; Diabetes in her mother; Heart disease in her father; Stomach cancer in her paternal uncle. Allergies  Allergen Reactions  . Aspirin     REACTION: had h. pylori due to too many goody powders. told not to take any more asa     Review of Systems  Constitutional: Positive for  appetite change and unexpected weight change. Negative for chills and fever.  HENT: Negative for trouble swallowing.   Respiratory: Negative for cough.   Cardiovascular: Negative for chest pain, palpitations and leg swelling.  Gastrointestinal: Negative for abdominal pain, diarrhea, nausea and vomiting.  Genitourinary: Negative for dysuria.       Objective:   Physical Exam  Constitutional: She appears well-developed and well-nourished.  HENT:  Mouth/Throat: Oropharynx is clear and moist.  Neck: Neck supple.  Cardiovascular: Normal rate and regular rhythm.   Pulmonary/Chest: Effort normal and breath sounds normal. No respiratory distress. She has no wheezes. She has no rales.  Musculoskeletal: She exhibits no edema.  Lymphadenopathy:    She has no cervical adenopathy.       Assessment:     #1 grief reaction. Patient given pamplet for hospice counseling. She was also given pamphlet for our behavioral health division and she is encouraged to set up counseling. Would not recommend medication this time  #2 history of severe dyslipidemia  #3 ongoing nicotine use  #4 weight loss probably secondary to #1  #4 history of mild hyperglycemia    Plan:     -Obtain labs with CBC, basic metabolic panel, hepatic panel, lipid panel, TSH, A1c -Information given on [place] quit line for smoking cessation -Set up hospice counseling -Routine follow-up in 3 months and sooner as needed  Eulas Post MD [facility] at [facility]

## 2017-04-07 NOTE — Patient Instructions (Signed)
Steps to Quit Smoking Smoking tobacco can be harmful to your health and can affect almost every organ in your body. Smoking puts you, and those around you, at risk for developing many serious chronic diseases. Quitting smoking is difficult, but it is one of the best things that you can do for your health. It is never too late to quit. What are the benefits of quitting smoking? When you quit smoking, you lower your risk of developing serious diseases and conditions, such as:  Lung cancer or lung disease, such as COPD.  Heart disease.  Stroke.  Heart attack.  Infertility.  Osteoporosis and bone fractures.  Additionally, symptoms such as coughing, wheezing, and shortness of breath may get better when you quit. You may also find that you get sick less often because your body is stronger at fighting off colds and infections. If you are pregnant, quitting smoking can help to reduce your chances of having a baby of low birth weight. How do I get ready to quit? When you decide to quit smoking, create a plan to make sure that you are successful. Before you quit:  Pick a date to quit. Set a date within the next two weeks to give you time to prepare.  Write down the reasons why you are quitting. Keep this list in places where you will see it often, such as on your bathroom mirror or in your car or wallet.  Identify the people, places, things, and activities that make you want to smoke (triggers) and avoid them. Make sure to take these actions: ? Throw away all cigarettes at home, at work, and in your car. ? Throw away smoking accessories, such as ashtrays and lighters. ? Clean your car and make sure to empty the ashtray. ? Clean your home, including curtains and carpets.  Tell your family, friends, and coworkers that you are quitting. Support from your loved ones can make quitting easier.  Talk with your health care provider about your options for quitting smoking.  Find out what treatment  options are covered by your health insurance.  What strategies can I use to quit smoking? Talk with your healthcare provider about different strategies to quit smoking. Some strategies include:  Quitting smoking altogether instead of gradually lessening how much you smoke over a period of time. Research shows that quitting "cold turkey" is more successful than gradually quitting.  Attending in-person counseling to help you build problem-solving skills. You are more likely to have success in quitting if you attend several counseling sessions. Even short sessions of 10 minutes can be effective.  Finding resources and support systems that can help you to quit smoking and remain smoke-free after you quit. These resources are most helpful when you use them often. They can include: ? Online chats with a counselor. ? Telephone quitlines. ? Printed self-help materials. ? Support groups or group counseling. ? Text messaging programs. ? Mobile phone applications.  Taking medicines to help you quit smoking. (If you are pregnant or breastfeeding, talk with your health care provider first.) Some medicines contain nicotine and some do not. Both types of medicines help with cravings, but the medicines that include nicotine help to relieve withdrawal symptoms. Your health care provider may recommend: ? Nicotine patches, gum, or lozenges. ? Nicotine inhalers or sprays. ? Non-nicotine medicine that is taken by mouth.  Talk with your health care provider about combining strategies, such as taking medicines while you are also receiving in-person counseling. Using these two strategies together   makes you more likely to succeed in quitting than if you used either strategy on its own. If you are pregnant or breastfeeding, talk with your health care provider about finding counseling or other support strategies to quit smoking. Do not take medicine to help you quit smoking unless told to do so by your health care  provider. What things can I do to make it easier to quit? Quitting smoking might feel overwhelming at first, but there is a lot that you can do to make it easier. Take these important actions:  Reach out to your family and friends and ask that they support and encourage you during this time. Call telephone quitlines, reach out to support groups, or work with a counselor for support.  Ask people who smoke to avoid smoking around you.  Avoid places that trigger you to smoke, such as bars, parties, or smoke-break areas at work.  Spend time around people who do not smoke.  Lessen stress in your life, because stress can be a smoking trigger for some people. To lessen stress, try: ? Exercising regularly. ? Deep-breathing exercises. ? Yoga. ? Meditating. ? Performing a body scan. This involves closing your eyes, scanning your body from head to toe, and noticing which parts of your body are particularly tense. Purposefully relax the muscles in those areas.  Download or purchase mobile phone or tablet apps (applications) that can help you stick to your quit plan by providing reminders, tips, and encouragement. There are many free apps, such as QuitGuide from the CDC (Centers for Disease Control and Prevention). You can find other support for quitting smoking (smoking cessation) through smokefree.gov and other websites.  How will I feel when I quit smoking? Within the first 24 hours of quitting smoking, you may start to feel some withdrawal symptoms. These symptoms are usually most noticeable 2-3 days after quitting, but they usually do not last beyond 2-3 weeks. Changes or symptoms that you might experience include:  Mood swings.  Restlessness, anxiety, or irritation.  Difficulty concentrating.  Dizziness.  Strong cravings for sugary foods in addition to nicotine.  Mild weight gain.  Constipation.  Nausea.  Coughing or a sore throat.  Changes in how your medicines work in your  body.  A depressed mood.  Difficulty sleeping (insomnia).  After the first 2-3 weeks of quitting, you may start to notice more positive results, such as:  Improved sense of smell and taste.  Decreased coughing and sore throat.  Slower heart rate.  Lower blood pressure.  Clearer skin.  The ability to breathe more easily.  Fewer sick days.  Quitting smoking is very challenging for most people. Do not get discouraged if you are not successful the first time. Some people need to make many attempts to quit before they achieve long-term success. Do your best to stick to your quit plan, and talk with your health care provider if you have any questions or concerns. This information is not intended to replace advice given to you by your health care provider. Make sure you discuss any questions you have with your health care provider. Document Released: 09/20/2001 Document Revised: 05/24/2016 Document Reviewed: 02/10/2015 Elsevier Interactive Patient Education  2017 Elsevier Inc.  

## 2017-04-10 ENCOUNTER — Other Ambulatory Visit: Payer: Self-pay | Admitting: *Deleted

## 2017-04-10 MED ORDER — ATORVASTATIN CALCIUM 40 MG PO TABS: 40.0000 mg | ORAL_TABLET | Freq: Every day | ORAL | 1 refills | Status: DC

## 2017-04-19 ENCOUNTER — Other Ambulatory Visit: Payer: Self-pay | Admitting: Family Medicine

## 2017-04-25 ENCOUNTER — Other Ambulatory Visit: Payer: Self-pay | Admitting: Family Medicine

## 2017-04-26 ENCOUNTER — Telehealth: Payer: Self-pay | Admitting: Family Medicine

## 2017-04-26 NOTE — Telephone Encounter (Signed)
Spoke to patient and scheduled follow up appointment for 05/23/17 with PCP; [facility]

## 2017-04-26 NOTE — Telephone Encounter (Signed)
Patient called wanting to know if she could pick up her  oxyCODONE (ROXICODONE) 15 MG immediate release tablet on Friday. She also wanted to know if she could "cut" her cholesterol medicatiojn in half because it's making her "Legs hurt". Please advise.

## 2017-04-26 NOTE — Telephone Encounter (Signed)
Last anxiety OV : 01/02/17 Last refilled : 03/02/17 #90 with 1 Refill.  Ok to refill? Please advise.

## 2017-04-26 NOTE — Telephone Encounter (Signed)
We discussed tapering on previous visit in March.  However, her boyfriend just died recently so probably not a great time to taper.  Refill for one month and make sure one month follow up.  Will discuss plan for further taper then.

## 2017-04-27 NOTE — Telephone Encounter (Signed)
Ok to refill by Friday.  OK to reduce Lipitor to half dose.

## 2017-04-27 NOTE — Telephone Encounter (Signed)
Apolonio Schneiders, can you take care of this please?

## 2017-04-27 NOTE — Telephone Encounter (Signed)
Pt calling to check the status of Rx

## 2017-04-28 MED ORDER — OXYCODONE HCL 15 MG PO TABS: 15.0000 mg | ORAL_TABLET | Freq: Three times a day (TID) | ORAL | 0 refills | Status: DC | PRN

## 2017-04-28 NOTE — Telephone Encounter (Signed)
Rx ready for pick up.  Patient is aware.  Med list updated

## 2017-05-23 ENCOUNTER — Ambulatory Visit (INDEPENDENT_AMBULATORY_CARE_PROVIDER_SITE_OTHER): Payer: BLUE CROSS/BLUE SHIELD | Admitting: Family Medicine

## 2017-05-23 ENCOUNTER — Encounter: Payer: Self-pay | Admitting: Family Medicine

## 2017-05-23 VITALS — BP 120/80 | HR 84 | Temp 98.1°F | Wt 139.2 lb

## 2017-05-23 DIAGNOSIS — F411 Generalized anxiety disorder: Secondary | ICD-10-CM | POA: Diagnosis not present

## 2017-05-23 NOTE — Progress Notes (Signed)
Subjective:     Patient ID: [name], female   DOB: 1961/05/13, 56 y.o.   MRN: [id number]  HPI Patient was apparently told to follow-up regarding her anxiety issues-though we just saw her recently.  She's not sure who told her to follow up.  She's been on Xanax for many years. Recent loss of husband due to Complications of cirrhosis. She is coping fairly well. She has some supportive friends and family. She had some initial counseling through hospice. He had couple episodes recently of what she describes possible panic attack. These lasted several minutes and then subsided. She apparently has tried serotonin medication such as Zoloft and Prozac in the past but had side effects. She feels her anxiety overall stable this time.  Past Medical History:  Diagnosis Date  . ASTHMA UNSPECIFIED WITH EXACERBATION 07/21/2010  . CARPAL TUNNEL SYNDROME, BILATERAL 01/16/2008  . COPD 05/08/2008  . GERD 04/18/2007  . HELICOBACTER PYLORI GASTRITIS, HX OF 04/18/2007  . PANIC DISORDER 04/18/2007  . TOBACCO ABUSE 07/17/2009   Past Surgical History:  Procedure Laterality Date  . APPENDECTOMY    . OOPHORECTOMY      reports that she has been smoking Cigarettes.  She has a 50.00 pack-year smoking history. She has never used smokeless tobacco. She reports that she does not drink alcohol or use drugs. family history includes Breast cancer in her paternal aunt; Cancer in her father; Clotting disorder in her brother; Diabetes in her mother; Heart disease in her father; Stomach cancer in her paternal uncle. Allergies  Allergen Reactions  . Aspirin     REACTION: had h. pylori due to too many goody powders. told not to take any more asa     Review of Systems  Constitutional: Negative for appetite change and unexpected weight change.  Respiratory: Negative for shortness of breath.   Cardiovascular: Negative for chest pain.  Psychiatric/Behavioral: Negative for suicidal ideas. The patient is nervous/anxious.         Objective:   Physical Exam  Constitutional: She appears well-developed and well-nourished.  Cardiovascular: Normal rate and regular rhythm.   Pulmonary/Chest: Effort normal and breath sounds normal. No respiratory distress. She has no wheezes. She has no rales.  Psychiatric: She has a normal mood and affect. Her behavior is normal. Judgment and thought content normal.       Assessment:     Chronic anxiety    Plan:     -We discussed nonpharmacologic ways of helping manage anxiety -We discussed tapering back of her Xanax and hopefully eventually off but feel this would not be a good time with recent loss of her husband few weeks ago -She is reluctant to consider SSRI medications again  Eulas Post MD [facility] at [facility]

## 2017-05-24 ENCOUNTER — Encounter: Payer: Self-pay | Admitting: *Deleted

## 2017-05-24 ENCOUNTER — Other Ambulatory Visit: Payer: Self-pay | Admitting: Family Medicine

## 2017-05-24 ENCOUNTER — Telehealth: Payer: Self-pay | Admitting: Family Medicine

## 2017-05-24 NOTE — Telephone Encounter (Signed)
We plan to taper further - but have decided against at this time secondary to passing away of her boyfriend recently. Refill by 05-26-17 with one additional refill.

## 2017-05-24 NOTE — Telephone Encounter (Signed)
OK by Friday

## 2017-05-24 NOTE — Telephone Encounter (Signed)
Last refill 04/26/17.  Spoke with pharmacist and no refills available. Okay to refill?

## 2017-05-24 NOTE — Telephone Encounter (Signed)
Pt needs new rx oxycodone and would like to pick up this friday

## 2017-05-24 NOTE — Telephone Encounter (Signed)
Last refill 04/28/17. Okay to refill?

## 2017-05-26 MED ORDER — OXYCODONE HCL 15 MG PO TABS: 15.0000 mg | ORAL_TABLET | Freq: Three times a day (TID) | ORAL | 0 refills | Status: DC | PRN

## 2017-05-26 NOTE — Telephone Encounter (Signed)
Rx ready for pick up and patient is aware 

## 2017-06-26 ENCOUNTER — Telehealth: Payer: Self-pay | Admitting: Family Medicine

## 2017-06-26 MED ORDER — OXYCODONE HCL 15 MG PO TABS: 15.0000 mg | ORAL_TABLET | Freq: Three times a day (TID) | ORAL | 0 refills | Status: DC | PRN

## 2017-06-26 NOTE — Telephone Encounter (Signed)
Pt need new Rx for Oxycodone   Pt is aware of 3 business days for refills and someone will call when ready for pick up. °

## 2017-06-26 NOTE — Telephone Encounter (Signed)
Refill OK

## 2017-06-26 NOTE — Telephone Encounter (Signed)
Last refill 05/26/16 and last office visit 05/23/16.  Okay to fill?

## 2017-06-26 NOTE — Telephone Encounter (Signed)
Rx ready for pick up and patient is aware 

## 2017-06-29 ENCOUNTER — Encounter: Payer: Self-pay | Admitting: Family Medicine

## 2017-07-18 ENCOUNTER — Ambulatory Visit (INDEPENDENT_AMBULATORY_CARE_PROVIDER_SITE_OTHER): Payer: BLUE CROSS/BLUE SHIELD | Admitting: Family Medicine

## 2017-07-18 ENCOUNTER — Encounter: Payer: Self-pay | Admitting: Family Medicine

## 2017-07-18 VITALS — BP 106/66 | HR 100 | Temp 98.3°F | Resp 16 | Ht 66.0 in | Wt 138.3 lb

## 2017-07-18 DIAGNOSIS — J441 Chronic obstructive pulmonary disease with (acute) exacerbation: Secondary | ICD-10-CM

## 2017-07-18 MED ORDER — DOXYCYCLINE HYCLATE 100 MG PO CAPS: 100.0000 mg | ORAL_CAPSULE | Freq: Two times a day (BID) | ORAL | 0 refills | Status: DC

## 2017-07-18 MED ORDER — PREDNISONE 20 MG PO TABS: ORAL_TABLET | ORAL | 0 refills | Status: DC

## 2017-07-18 NOTE — Patient Instructions (Signed)
Chronic Obstructive Pulmonary Disease Exacerbation Chronic obstructive pulmonary disease (COPD) is a common lung condition in which airflow from the lungs is limited. COPD is a general term that can be used to describe many different lung problems that limit airflow, including chronic bronchitis and emphysema. COPD exacerbations are episodes when breathing symptoms become much worse and require extra treatment. Without treatment, COPD exacerbations can be life threatening, and frequent COPD exacerbations can cause further damage to your lungs. What are the causes?  Respiratory infections.  Exposure to smoke.  Exposure to air pollution, chemical fumes, or dust. Sometimes there is no apparent cause or trigger. What increases the risk?  Smoking cigarettes.  Older age.  Frequent prior COPD exacerbations. What are the signs or symptoms?  Increased coughing.  Increased thick spit (sputum) production.  Increased wheezing.  Increased shortness of breath.  Rapid breathing.  Chest tightness. How is this diagnosed? Your medical history, a physical exam, and tests will help your health care provider make a diagnosis. Tests may include:  A chest X-ray.  Basic lab tests.  Sputum testing.  An arterial blood gas test.  How is this treated? Depending on the severity of your COPD exacerbation, you may need to be admitted to a hospital for treatment. Some of the treatments commonly used to treat COPD exacerbations are:  Antibiotic medicines.  Bronchodilators. These are drugs that expand the air passages. They may be given with an inhaler or nebulizer. Spacer devices may be needed to help improve drug delivery.  Corticosteroid medicines.  Supplemental oxygen therapy.  Airway clearing techniques, such as noninvasive ventilation (NIV) and positive expiratory pressure (PEP). These provide respiratory support through a mask or other noninvasive device.  Follow these instructions at  home:  Do not smoke. Quitting smoking is very important to prevent COPD from getting worse and exacerbations from happening as often.  Avoid exposure to all substances that irritate the airway, especially to tobacco smoke.  If you were prescribed an antibiotic medicine, finish it all even if you start to feel better.  Take all medicines as directed by your health care provider.It is important to use correct technique with inhaled medicines.  Drink enough fluids to keep your urine clear or pale yellow (unless you have a medical condition that requires fluid restriction).  Use a cool mist vaporizer. This makes it easier to clear your chest when you cough.  If you have a home nebulizer and oxygen, continue to use them as directed.  Maintain all necessary vaccinations to prevent infections.  Exercise regularly.  Eat a healthy diet.  Keep all follow-up appointments as directed by your health care provider. Get help right away if:  You have worsening shortness of breath.  You have trouble talking.  You have severe chest pain.  You have blood in your sputum.  You have a fever.  You have weakness, vomit repeatedly, or faint.  You feel confused.  You continue to get worse. This information is not intended to replace advice given to you by your health care provider. Make sure you discuss any questions you have with your health care provider. Document Released: 07/24/2007 Document Revised: 03/03/2016 Document Reviewed: 05/31/2013 Elsevier Interactive Patient Education  2017 Elsevier Inc.  

## 2017-07-18 NOTE — Progress Notes (Signed)
Subjective:     Patient ID: [name], female   DOB: 1961-09-24, 56 y.o.   MRN: [id number]  HPI Patient seen with onset of acute illness last weekend. She sates she has a granddaughter had upper rest for symptoms. She has had some nasal congestion and productive cough and some increased wheezing. Ongoing cigarette use and history of COPD. She is using albuterol inhaler some mild relief of wheezing. Cough is worse at night. Possible low-grade fever 100 yesterday. She's not taken temperature today. No hemoptysis. No dyspnea at rest. Does have significant wheezing at rest  Past Medical History:  Diagnosis Date  . ASTHMA UNSPECIFIED WITH EXACERBATION 07/21/2010  . CARPAL TUNNEL SYNDROME, BILATERAL 01/16/2008  . COPD 05/08/2008  . GERD 04/18/2007  . HELICOBACTER PYLORI GASTRITIS, HX OF 04/18/2007  . PANIC DISORDER 04/18/2007  . TOBACCO ABUSE 07/17/2009   Past Surgical History:  Procedure Laterality Date  . APPENDECTOMY    . OOPHORECTOMY      reports that she has been smoking Cigarettes.  She has a 50.00 pack-year smoking history. She has never used smokeless tobacco. She reports that she does not drink alcohol or use drugs. family history includes Breast cancer in her paternal aunt; Cancer in her father; Clotting disorder in her brother; Diabetes in her mother; Heart disease in her father; Stomach cancer in her paternal uncle. Allergies  Allergen Reactions  . Aspirin     REACTION: had h. pylori due to too many goody powders. told not to take any more asa     Review of Systems  Constitutional: Positive for fever.  HENT: Positive for congestion and rhinorrhea. Negative for sinus pain, sinus pressure and sore throat.   Respiratory: Positive for cough and wheezing. Negative for shortness of breath.   Cardiovascular: Negative for chest pain.       Objective:   Physical Exam  Constitutional: She appears well-developed and well-nourished.  HENT:  Right Ear: External ear normal.  Left Ear:  External ear normal.  Mouth/Throat: Oropharynx is clear and moist.  Neck: Neck supple.  Cardiovascular: Normal rate and regular rhythm.   Pulmonary/Chest:  Patient has some diffuse expiratory wheezes. Pulse oximetry 96%. Respiratory rate is normal. No retractions. No rales.  Lymphadenopathy:    She has no cervical adenopathy.       Assessment:     Acute exacerbation of COPD.  Patient is in no respiratory distress    Plan:     -Patient still needs flu vaccine but will defer until she is over acute illness -Start doxycycline 100 mg twice daily for 10 days -Prednisone 20 mg 2 tablets once daily for 5 days -Continue albuterol inhaler as needed every 4 hours -Follow-up immediately for any increasing fever or increasing shortness of breath or other concerns  Eulas Post MD [facility] at [facility]

## 2017-07-22 ENCOUNTER — Other Ambulatory Visit: Payer: Self-pay | Admitting: Family Medicine

## 2017-07-24 NOTE — Telephone Encounter (Signed)
Pt is call to check the status of Rx Xanax.  Pt need new Rx for Oxycodone   Pt is aware of 3 business days for refills and someone will call when ready for pick up.

## 2017-07-24 NOTE — Telephone Encounter (Signed)
Refill one month only.  Set up for chronic pain management visit in one month.

## 2017-07-26 MED ORDER — OXYCODONE HCL 15 MG PO TABS: 15.0000 mg | ORAL_TABLET | Freq: Three times a day (TID) | ORAL | 0 refills | Status: DC | PRN

## 2017-08-23 ENCOUNTER — Other Ambulatory Visit: Payer: Self-pay | Admitting: Family Medicine

## 2017-08-23 ENCOUNTER — Ambulatory Visit: Payer: BLUE CROSS/BLUE SHIELD | Admitting: Family Medicine

## 2017-08-23 ENCOUNTER — Encounter: Payer: Self-pay | Admitting: Family Medicine

## 2017-08-23 VITALS — BP 140/80 | HR 96 | Temp 98.3°F | Wt 139.9 lb

## 2017-08-23 DIAGNOSIS — Z79899 Other long term (current) drug therapy: Secondary | ICD-10-CM

## 2017-08-23 DIAGNOSIS — F411 Generalized anxiety disorder: Secondary | ICD-10-CM

## 2017-08-23 DIAGNOSIS — G8929 Other chronic pain: Secondary | ICD-10-CM | POA: Diagnosis not present

## 2017-08-23 DIAGNOSIS — Z23 Encounter for immunization: Secondary | ICD-10-CM | POA: Diagnosis not present

## 2017-08-23 MED ORDER — OXYCODONE HCL 15 MG PO TABS: 15.0000 mg | ORAL_TABLET | Freq: Three times a day (TID) | ORAL | 0 refills | Status: DC | PRN

## 2017-08-23 NOTE — Patient Instructions (Signed)

## 2017-08-23 NOTE — Telephone Encounter (Signed)
Last refill 07/26/17 and last office visit 08/23/17.  Okay to fill?

## 2017-08-23 NOTE — Progress Notes (Signed)
Subjective:     Patient ID: [name], female   DOB: [birth year], 56 y.o.   MRN: [id number]  HPI Patient here to discuss chronic pain management. She has history of chronic low back pain and has been on oxycodone for many years and was on this when she came to me several years ago. She is also on high-dose alprazolam. We have managed to taper this back some. She was previously taking Xanax 2 mg 3 times a day we gotten this down to 1 mg 3 times a day. She had been on oxycodone 15 mg 4 times a day and is now on 3 times a day.   She states her pain is fairly well controlled on medication but without it she has 7 out of 10 pain. Denies any side effects.  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 She realizes this is not a good job for her long-term because of her chronic back pain.  Indication for chronic opioid: Chronic low back pain Medication and dose: Oxycodone 15 mg 3 times a day  # pills per month: 90 Last UDS date: today Pain contract signed (Y/N): 08/23/17 Date narcotic database last reviewed (include red flags): 08/23/17-no red flags.  Denies any constipation or other side effects.   She previously tried Tylenol, NSAIDS, Tramadol without relief  Past Medical History:  Diagnosis Date  . ASTHMA UNSPECIFIED WITH EXACERBATION 07/21/2010  . CARPAL TUNNEL SYNDROME, BILATERAL 01/16/2008  . COPD 05/08/2008  . GERD 04/18/2007  . HELICOBACTER PYLORI GASTRITIS, HX OF 04/18/2007  . PANIC DISORDER 04/18/2007  . TOBACCO ABUSE 07/17/2009   Past Surgical History:  Procedure Laterality Date  . APPENDECTOMY    . OOPHORECTOMY      reports that she has been smoking cigarettes.  She has a 50.00 pack-year smoking history. she has never used smokeless tobacco. She reports that she does not drink alcohol or use drugs. family history includes Breast cancer in her paternal aunt; Cancer in her father; Clotting disorder in her brother; Diabetes in her mother; Heart disease in  her father; Stomach cancer in her paternal uncle. Allergies  Allergen Reactions  . Aspirin     REACTION: had h. pylori due to too many goody powders. told not to take any more asa    Review of Systems  Constitutional: Negative for chills and fever.  Respiratory: Negative for shortness of breath.   Cardiovascular: Negative for chest pain.  Gastrointestinal: Negative for abdominal pain and constipation.  Musculoskeletal: Positive for back pain.       Objective:   Physical Exam  Constitutional: She is oriented to person, place, and time. She appears well-developed and well-nourished.  Cardiovascular: Normal rate and regular rhythm.  Pulmonary/Chest: Effort normal and breath sounds normal. No respiratory distress. She has no wheezes. She has no rales.  Musculoskeletal: She exhibits no edema.  Neurological: She is alert and oriented to person, place, and time.  No focal strength deficits.       Assessment:     #1 Chronic low back pain-on chronic opioids  #2 chronic anxiety    Plan:     -Drug contract signed and reviewed with patient. She has no questions.  This will be reviewed yearly. -Flu vaccine given -Obtain urine drug screen -Refill oxycodone for 3 months with 3 month follow-up -We will continue to explore with patient trying to taper back -we would like to further taper her Xanax.  Her husband recently passed away, so  we feel this would not be great timing at this time.  Eulas Post MD [facility] at [facility]

## 2017-08-24 NOTE — Telephone Encounter (Signed)
Refill by 08-25-17 with 2 additional refills.

## 2017-08-25 ENCOUNTER — Ambulatory Visit: Payer: BLUE CROSS/BLUE SHIELD | Admitting: Family Medicine

## 2017-08-25 NOTE — Telephone Encounter (Signed)
Copied from [place] #[id number]. Topic: Quick Communication - See Telephone Encounter >> Aug 25, 2017  8:23 AM Ether Griffins B wrote: CRM for notification. See Telephone encounter for:  Pt stating office faxed rx for xanax Wednesday and pharmacy hasnt received the script.   08/25/17.

## 2017-08-25 NOTE — Telephone Encounter (Signed)
Rx called in 

## 2017-08-30 LAB — PAIN MGMT, PROFILE 8 W/CONF, U
6 ACETYLMORPHINE: NEGATIVE ng/mL (ref <10)
AMINOCLONAZEPAM: NEGATIVE ng/mL (ref <25)
Alcohol Metabolites: POSITIVE ng/mL — AB (ref <500)
Alphahydroxyalprazolam: 404 ng/mL — ABNORMAL HIGH (ref <25)
Alphahydroxymidazolam: NEGATIVE ng/mL (ref <50)
Alphahydroxytriazolam: NEGATIVE ng/mL (ref <50)
Amphetamines: NEGATIVE ng/mL (ref <500)
Benzodiazepines: POSITIVE ng/mL — AB (ref <100)
Buprenorphine, Urine: NEGATIVE ng/mL (ref <5)
COCAINE METABOLITE: NEGATIVE ng/mL (ref <150)
CREATININE: 65 mg/dL
Codeine: NEGATIVE ng/mL (ref <50)
ETHYL GLUCURONIDE (ETG): 4048 ng/mL — AB (ref <500)
ETHYL SULFATE (ETS): 1114 ng/mL — AB (ref <100)
HYDROCODONE: NEGATIVE ng/mL (ref <50)
HYDROXYETHYLFLURAZEPAM: NEGATIVE ng/mL (ref <50)
Hydromorphone: NEGATIVE ng/mL (ref <50)
LORAZEPAM: NEGATIVE ng/mL (ref <50)
MDMA: NEGATIVE ng/mL (ref <500)
MORPHINE: NEGATIVE ng/mL (ref <50)
Marijuana Metabolite: NEGATIVE ng/mL (ref <20)
NORDIAZEPAM: NEGATIVE ng/mL (ref <50)
Norhydrocodone: NEGATIVE ng/mL (ref <50)
Noroxycodone: 3045 ng/mL — ABNORMAL HIGH (ref <50)
OXYCODONE: 4013 ng/mL — AB (ref <50)
OXYMORPHONE: 3034 ng/mL — AB (ref <50)
Opiates: NEGATIVE ng/mL (ref <100)
Oxazepam: NEGATIVE ng/mL (ref <50)
Oxidant: NEGATIVE ug/mL (ref <200)
Oxycodone: POSITIVE ng/mL — AB (ref <100)
PH: 5.31 (ref 4.5–9.0)
TEMAZEPAM: NEGATIVE ng/mL (ref <50)

## 2017-09-20 ENCOUNTER — Other Ambulatory Visit: Payer: Self-pay | Admitting: Family Medicine

## 2017-11-01 ENCOUNTER — Ambulatory Visit: Payer: BLUE CROSS/BLUE SHIELD | Admitting: Family Medicine

## 2017-11-01 ENCOUNTER — Encounter: Payer: Self-pay | Admitting: Family Medicine

## 2017-11-01 VITALS — BP 120/78 | HR 75 | Temp 97.7°F | Ht 66.0 in | Wt 137.6 lb

## 2017-11-01 DIAGNOSIS — F322 Major depressive disorder, single episode, severe without psychotic features: Secondary | ICD-10-CM | POA: Diagnosis not present

## 2017-11-01 DIAGNOSIS — R5383 Other fatigue: Secondary | ICD-10-CM

## 2017-11-01 MED ORDER — BUPROPION HCL ER (XL) 150 MG PO TB24: 150.0000 mg | ORAL_TABLET | Freq: Every day | ORAL | 1 refills | Status: DC

## 2017-11-01 NOTE — Progress Notes (Signed)
Subjective:     Patient ID: [name], female   DOB: August 21, 1961, 57 y.o.   MRN: [id number]  HPI Patient is here to discuss fatigue and depression symptoms. Progressive depression symptoms over several months. Her husband passed away last 03/14/2023 due to complications of cirrhosis. She's felt overstressed since then. 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 Decreased appetite. 2 pound weight loss since last visit here November. She had normal TSH last June. Denies recent chest pain. No cough. No fevers or chills.  She is having increased depression symptoms. No suicidal ideation. Several years ago tried on SSRI but did  not see much benefit. She has anhedonia and decreased motivation.  Past Medical History:  Diagnosis Date  . ASTHMA UNSPECIFIED WITH EXACERBATION 07/21/2010  . CARPAL TUNNEL SYNDROME, BILATERAL 01/16/2008  . COPD 05/08/2008  . GERD 04/18/2007  . HELICOBACTER PYLORI GASTRITIS, HX OF 04/18/2007  . PANIC DISORDER 04/18/2007  . TOBACCO ABUSE 07/17/2009   Past Surgical History:  Procedure Laterality Date  . APPENDECTOMY    . OOPHORECTOMY      reports that she has been smoking cigarettes.  She has a 50.00 pack-year smoking history. she has never used smokeless tobacco. She reports that she does not drink alcohol or use drugs. family history includes Breast cancer in her paternal aunt; Cancer in her father; Clotting disorder in her brother; Diabetes in her mother; Heart disease in her father; Stomach cancer in her paternal uncle. Allergies  Allergen Reactions  . Aspirin     REACTION: had h. pylori due to too many goody powders. told not to take any more asa     Review of Systems  Constitutional: Positive for appetite change. Negative for chills and fever.  Respiratory: Negative for shortness of breath.   Cardiovascular: Negative for chest pain.  Gastrointestinal: Negative for abdominal pain, nausea and vomiting.  Genitourinary:  Negative for dysuria.  Neurological: Negative for headaches.  Hematological: Negative for adenopathy. Does not bruise/bleed easily.  Psychiatric/Behavioral: Positive for dysphoric mood. Negative for agitation, confusion and suicidal ideas.       Objective:   Physical Exam  Constitutional: She is oriented to person, place, and time. She appears well-developed and well-nourished.  Neck: Neck supple. No thyromegaly present.  Cardiovascular: Normal rate and regular rhythm.  Pulmonary/Chest: Effort normal and breath sounds normal. No respiratory distress. She has no wheezes. She has no rales.  Neurological: She is alert and oriented to person, place, and time. No cranial nerve deficit.  Psychiatric:  PH Q-9 score of 21       Assessment:     #1 major depressive disorder severe episode. No suicidal ideation  #2 fatigue probably secondary to #1    Plan:     -We discussed counseling and she will consider getting this through hospice vs information regarding our behavioral health division -Start Wellbutrin XL 150 mg once daily and reassess in 3 weeks. May need to titrate further then  Eulas Post MD [facility]

## 2017-11-15 ENCOUNTER — Ambulatory Visit: Payer: BLUE CROSS/BLUE SHIELD | Admitting: Family Medicine

## 2017-11-15 ENCOUNTER — Encounter: Payer: Self-pay | Admitting: Family Medicine

## 2017-11-15 VITALS — BP 110/78 | HR 86 | Temp 98.0°F | Wt 136.6 lb

## 2017-11-15 DIAGNOSIS — G8929 Other chronic pain: Secondary | ICD-10-CM | POA: Diagnosis not present

## 2017-11-15 DIAGNOSIS — F322 Major depressive disorder, single episode, severe without psychotic features: Secondary | ICD-10-CM | POA: Diagnosis not present

## 2017-11-15 DIAGNOSIS — M545 Low back pain, unspecified: Secondary | ICD-10-CM

## 2017-11-15 DIAGNOSIS — R0789 Other chest pain: Secondary | ICD-10-CM

## 2017-11-15 MED ORDER — OXYCODONE HCL 15 MG PO TABS: 15.0000 mg | ORAL_TABLET | ORAL | 0 refills | Status: DC | PRN

## 2017-11-15 MED ORDER — OXYCODONE HCL 15 MG PO TABS: 15.0000 mg | ORAL_TABLET | Freq: Three times a day (TID) | ORAL | 0 refills | Status: DC | PRN

## 2017-11-15 NOTE — Progress Notes (Signed)
Subjective:     Patient ID: [name], female   DOB: [birth year], 57 y.o.   MRN: [id number]  HPI Patient seen for the following issues  She was scheduled initially for pain med check. She's been on oxycodone for several years. No recent side effects. Still has breakthrough pain at times. We offered referral to pain management in the past and she declines.  Recent initiation of Wellbutrin 150 mg. She's had some "bad dreams "and she thinks this may be related medication. Otherwise no side effects. She does feel her depression symptoms are somewhat improved. She was actually requesting whether we can lower her dose and we explained this is the lowest dose and the starting dose.  Patient has new issue of some vague intermittent epigastric discomfort. She had remote history of ulcer but states this is different. She describes this as "feeling empty ". No relation to eating. No nausea or vomiting. 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 No left arm or left neck symptoms.  Patient had nuclear stress test 2015 with no ischemia  Past Medical History:  Diagnosis Date  . ASTHMA UNSPECIFIED WITH EXACERBATION 07/21/2010  . CARPAL TUNNEL SYNDROME, BILATERAL 01/16/2008  . COPD 05/08/2008  . GERD 04/18/2007  . HELICOBACTER PYLORI GASTRITIS, HX OF 04/18/2007  . PANIC DISORDER 04/18/2007  . TOBACCO ABUSE 07/17/2009   Past Surgical History:  Procedure Laterality Date  . APPENDECTOMY    . OOPHORECTOMY      reports that she has been smoking cigarettes.  She has a 50.00 pack-year smoking history. she has never used smokeless tobacco. She reports that she does not drink alcohol or use drugs. family history includes Breast cancer in her paternal aunt; Cancer in her father; Clotting disorder in her brother; Diabetes in her mother; Heart disease in her father; Stomach cancer in her paternal uncle. Allergies  Allergen Reactions  . Aspirin     REACTION:  had h. pylori due to too many goody powders. told not to take any more asa     Review of Systems  Constitutional: Negative for fatigue.  Eyes: Negative for visual disturbance.  Respiratory: Negative for cough, chest tightness, shortness of breath and wheezing.   Cardiovascular: Positive for chest pain. Negative for palpitations and leg swelling.  Gastrointestinal: Positive for abdominal pain. Negative for nausea and vomiting.  Neurological: Negative for dizziness, seizures, syncope, weakness, light-headedness and headaches.       Objective:   Physical Exam  Constitutional: She appears well-developed and well-nourished.  Eyes: Pupils are equal, round, and reactive to light.  Neck: Neck supple. No JVD present. No thyromegaly present.  Cardiovascular: Normal rate and regular rhythm. Exam reveals no gallop.  Pulmonary/Chest: Effort normal and breath sounds normal. No respiratory distress. She has no wheezes. She has no rales.  Abdominal: Soft. Bowel sounds are normal. She exhibits no distension and no mass. There is no rebound and no guarding.  Minimally tender epigastric region. No guarding or rebound.  Musculoskeletal: She exhibits no edema.  Neurological: She is alert.       Assessment:     #1 chronic back pain-stable  #2 adjustment disorder with depressed mood with recent initiation of low-dose Wellbutrin and improved.  #3 atypical chest pain    Plan:     -Check EKG-NSR with non-specific T wave changes unchanged from 2015. -Cross controlled substance registry reviewed with no red flags -refilled her Oxycodone for 3  months.  Eulas Post MD [facility] at [facility]

## 2017-11-20 ENCOUNTER — Other Ambulatory Visit: Payer: Self-pay | Admitting: Family Medicine

## 2017-11-20 NOTE — Telephone Encounter (Signed)
Refill for 3 months. 

## 2017-12-08 ENCOUNTER — Telehealth: Payer: Self-pay | Admitting: Family Medicine

## 2017-12-08 NOTE — Telephone Encounter (Signed)
Spoke with patient and pharmacy.

## 2017-12-08 NOTE — Telephone Encounter (Signed)
Copied from [place] [id number]. Topic: Inquiry >> Dec 08, 2017  9:50 AM Pricilla Handler wrote: Reason for CRM: Patient called requesting to speak with Dr. Erick Blinks nurse Apolonio Schneiders. Patient states that she wants to ask her a question about taking her Oxycodone every four hours or every eight hours. Triage Nurse Jenny Reichmann advised that this would need to be answered by the office. Please call patient this morning at  [phone].       Thank You!!!

## 2017-12-15 ENCOUNTER — Other Ambulatory Visit: Payer: Self-pay | Admitting: Family Medicine

## 2018-02-02 ENCOUNTER — Encounter: Payer: Self-pay | Admitting: Family Medicine

## 2018-02-02 ENCOUNTER — Ambulatory Visit: Payer: BLUE CROSS/BLUE SHIELD | Admitting: Family Medicine

## 2018-02-02 VITALS — BP 120/80 | HR 60 | Temp 97.9°F | Wt 142.1 lb

## 2018-02-02 DIAGNOSIS — R1013 Epigastric pain: Secondary | ICD-10-CM | POA: Diagnosis not present

## 2018-02-02 DIAGNOSIS — M545 Low back pain: Secondary | ICD-10-CM

## 2018-02-02 DIAGNOSIS — K219 Gastro-esophageal reflux disease without esophagitis: Secondary | ICD-10-CM | POA: Diagnosis not present

## 2018-02-02 LAB — POCT URINALYSIS DIPSTICK
Bilirubin, UA: NEGATIVE
Blood, UA: NEGATIVE
Glucose, UA: NEGATIVE
Ketones, UA: NEGATIVE
Leukocytes, UA: NEGATIVE
Nitrite, UA: NEGATIVE
Protein, UA: NEGATIVE
Spec Grav, UA: 1.015
Urobilinogen, UA: 0.2 U/dL
pH, UA: 6

## 2018-02-02 NOTE — Patient Instructions (Signed)
Food Choices for Gastroesophageal Reflux Disease, Adult When you have gastroesophageal reflux disease (GERD), the foods you eat and your eating habits are very important. Choosing the right foods can help ease the discomfort of GERD. Consider working with a diet and nutrition specialist (dietitian) to help you make healthy food choices. What general guidelines should I follow? Eating plan  Choose healthy foods low in fat, such as fruits, vegetables, whole grains, low-fat dairy products, and lean meat, fish, and poultry.  Eat frequent, small meals instead of three large meals each day. Eat your meals slowly, in a relaxed setting. Avoid bending over or lying down until 2-3 hours after eating.  Limit high-fat foods such as fatty meats or fried foods.  Limit your intake of oils, butter, and shortening to less than 8 teaspoons each day.  Avoid the following: ? Foods that cause symptoms. These may be different for different people. Keep a food diary to keep track of foods that cause symptoms. ? Alcohol. ? Drinking large amounts of liquid with meals. ? Eating meals during the 2-3 hours before bed.  Cook foods using methods other than frying. This may include baking, grilling, or broiling. Lifestyle   Maintain a healthy weight. Ask your health care provider what weight is healthy for you. If you need to lose weight, work with your health care provider to do so safely.  Exercise for at least 30 minutes on 5 or more days each week, or as told by your health care provider.  Avoid wearing clothes that fit tightly around your waist and chest.  Do not use any products that contain nicotine or tobacco, such as cigarettes and e-cigarettes. If you need help quitting, ask your health care provider.  Sleep with the head of your bed raised. Use a wedge under the mattress or blocks under the bed frame to raise the head of the bed. What foods are not recommended? The items listed may not be a complete  list. Talk with your dietitian about what dietary choices are best for you. Grains Pastries or quick breads with added fat. French toast. Vegetables Deep fried vegetables. French fries. Any vegetables prepared with added fat. Any vegetables that cause symptoms. For some people this may include tomatoes and tomato products, chili peppers, onions and garlic, and horseradish. Fruits Any fruits prepared with added fat. Any fruits that cause symptoms. For some people this may include citrus fruits, such as oranges, grapefruit, pineapple, and lemons. Meats and other protein foods High-fat meats, such as fatty beef or pork, hot dogs, ribs, ham, sausage, salami and bacon. Fried meat or protein, including fried fish and fried chicken. Nuts and nut butters. Dairy Whole milk and chocolate milk. Sour cream. Cream. Ice cream. Cream cheese. Milk shakes. Beverages Coffee and tea, with or without caffeine. Carbonated beverages. Sodas. Energy drinks. Fruit juice made with acidic fruits (such as orange or grapefruit). Tomato juice. Alcoholic drinks. Fats and oils Butter. Margarine. Shortening. Ghee. Sweets and desserts Chocolate and cocoa. Donuts. Seasoning and other foods Pepper. Peppermint and spearmint. Any condiments, herbs, or seasonings that cause symptoms. For some people, this may include curry, hot sauce, or vinegar-based salad dressings. Summary  When you have gastroesophageal reflux disease (GERD), food and lifestyle choices are very important to help ease the discomfort of GERD.  Eat frequent, small meals instead of three large meals each day. Eat your meals slowly, in a relaxed setting. Avoid bending over or lying down until 2-3 hours after eating.  Limit high-fat   foods such as fatty meat or fried foods. This information is not intended to replace advice given to you by your health care provider. Make sure you discuss any questions you have with your health care provider. Document Released:  09/26/2005 Document Revised: 09/27/2016 Document Reviewed: 09/27/2016 Elsevier Interactive Patient Education  2018 [place].  Increase Prilosec 20 mg to TWO once daily May supplement with once daily Zantac 150 mg Gradually reduce coffee/caffeine.

## 2018-02-02 NOTE — Progress Notes (Signed)
  Subjective:     Patient ID: [name], female   DOB: 1961-03-11, 57 y.o.   MRN: [id number]  HPI Patient seen with some epigastric burning and atypical chest pains. She states she's had over a month of some epigastric burning. She takes Prilosec 20 mg daily. No dysphagia. No melena. No hematemesis. She tried over-the-counter Zantac yesterday 75 mg without much improvement. Drinks she states about 5 cups of coffee each day. That may be exacerbating.  Very stressful year with loss of husband in the past year. She still smokes. Denies any appetite or weight changes. Pain is epigastric and frequently burning sensation that radiates somewhat toward the chest and occasionally she has some radiation toward the back region.  Also complains of some intermittent right flank pain. No burning with urination. No fevers or chills  Past Medical History:  Diagnosis Date  . ASTHMA UNSPECIFIED WITH EXACERBATION 07/21/2010  . CARPAL TUNNEL SYNDROME, BILATERAL 01/16/2008  . COPD 05/08/2008  . GERD 04/18/2007  . HELICOBACTER PYLORI GASTRITIS, HX OF 04/18/2007  . PANIC DISORDER 04/18/2007  . TOBACCO ABUSE 07/17/2009   Past Surgical History:  Procedure Laterality Date  . APPENDECTOMY    . OOPHORECTOMY      reports that she has been smoking cigarettes.  She has a 50.00 pack-year smoking history. She has never used smokeless tobacco. She reports that she does not drink alcohol or use drugs. family history includes Breast cancer in her paternal aunt; Cancer in her father; Clotting disorder in her brother; Diabetes in her mother; Heart disease in her father; Stomach cancer in her paternal uncle. Allergies  Allergen Reactions  . Aspirin     REACTION: had h. pylori due to too many goody powders. told not to take any more asa     Review of Systems  Constitutional: Negative for chills and fever.  Respiratory: Negative for shortness of breath.   Cardiovascular: Negative for chest pain.  Gastrointestinal: Positive  for abdominal pain. Negative for abdominal distention, blood in stool and diarrhea.  Genitourinary: Positive for flank pain.  Neurological: Negative for dizziness.       Objective:   Physical Exam  Constitutional: She appears well-developed and well-nourished.  HENT:  Right Ear: External ear normal.  Left Ear: External ear normal.  Mouth/Throat: Oropharynx is clear and moist.  Cardiovascular: Normal rate and regular rhythm.  Pulmonary/Chest: Effort normal and breath sounds normal. No respiratory distress. She has no wheezes. She has no rales.  Abdominal: Soft. Bowel sounds are normal. She exhibits no distension and no mass. There is no rebound and no guarding.  Minimally tender epigastric region  Musculoskeletal: She exhibits no edema.       Assessment:     Patient presents with several week history of epigastric discomfort. Suspect probably GERD related. No history of pancreatitis. Doubt cardiac She describes some ongoing epigastric discomfort in spite of daily PPI therapy.  No weight loss.    Plan:     -EKG obtained which shows sinus rhythm with no acute changes -Short-term, increase Prilosec to 40 mg daily and may supplement with Zantac 150 mg daily -Check further labs with CBC, comprehensive metabolic panel, lipase -Set up referral to GI for further evaluation -Gradually scale back caffeine use  Eulas Post MD [facility]

## 2018-02-03 LAB — HEPATIC FUNCTION PANEL
AG RATIO: 1.3 (calc) (ref 1.0–2.5)
ALKALINE PHOSPHATASE (APISO): 89 U/L (ref 33–130)
ALT: 18 U/L (ref 6–29)
AST: 17 U/L (ref 10–35)
Albumin: 4.1 g/dL (ref 3.6–5.1)
BILIRUBIN INDIRECT: 0.4 mg/dL (ref 0.2–1.2)
Bilirubin, Direct: 0.1 mg/dL (ref 0.0–0.2)
Globulin: 3.2 g/dL (calc) (ref 1.9–3.7)
TOTAL PROTEIN: 7.3 g/dL (ref 6.1–8.1)
Total Bilirubin: 0.5 mg/dL (ref 0.2–1.2)

## 2018-02-03 LAB — CBC WITH DIFFERENTIAL/PLATELET
BASOS ABS: 30 {cells}/uL (ref 0–200)
Basophils Relative: 0.4 %
EOS ABS: 148 {cells}/uL (ref 15–500)
EOS PCT: 2 %
HCT: 40 % (ref 35.0–45.0)
Hemoglobin: 13.7 g/dL (ref 11.7–15.5)
Lymphs Abs: 2427 cells/uL (ref 850–3900)
MCH: 31.1 pg (ref 27.0–33.0)
MCHC: 34.3 g/dL (ref 32.0–36.0)
MCV: 90.9 fL (ref 80.0–100.0)
MONOS PCT: 7.2 %
MPV: 10.2 fL (ref 7.5–12.5)
NEUTROS PCT: 57.6 %
Neutro Abs: 4262 cells/uL (ref 1500–7800)
Platelets: 248 10*3/uL (ref 140–400)
RBC: 4.4 10*6/uL (ref 3.80–5.10)
RDW: 13 % (ref 11.0–15.0)
TOTAL LYMPHOCYTE: 32.8 %
WBC mixed population: 533 cells/uL (ref 200–950)
WBC: 7.4 10*3/uL (ref 3.8–10.8)

## 2018-02-03 LAB — BASIC METABOLIC PANEL
BUN: 12 mg/dL (ref 7–25)
CALCIUM: 9.6 mg/dL (ref 8.6–10.4)
CHLORIDE: 103 mmol/L (ref 98–110)
CO2: 27 mmol/L (ref 20–32)
Creat: 0.73 mg/dL (ref 0.50–1.05)
Glucose, Bld: 96 mg/dL (ref 65–99)
POTASSIUM: 4.3 mmol/L (ref 3.5–5.3)
SODIUM: 138 mmol/L (ref 135–146)

## 2018-02-03 LAB — LIPASE: Lipase: 18 U/L (ref 7–60)

## 2018-02-05 ENCOUNTER — Ambulatory Visit: Payer: BLUE CROSS/BLUE SHIELD | Admitting: Family Medicine

## 2018-02-05 ENCOUNTER — Encounter: Payer: Self-pay | Admitting: Gastroenterology

## 2018-02-06 ENCOUNTER — Encounter: Payer: Self-pay | Admitting: Family Medicine

## 2018-02-06 ENCOUNTER — Ambulatory Visit: Payer: BLUE CROSS/BLUE SHIELD | Admitting: Family Medicine

## 2018-02-06 VITALS — BP 110/70 | HR 80 | Temp 98.0°F | Wt 143.2 lb

## 2018-02-06 DIAGNOSIS — Z5181 Encounter for therapeutic drug level monitoring: Secondary | ICD-10-CM

## 2018-02-06 DIAGNOSIS — G8929 Other chronic pain: Secondary | ICD-10-CM | POA: Diagnosis not present

## 2018-02-06 DIAGNOSIS — M545 Low back pain, unspecified: Secondary | ICD-10-CM

## 2018-02-06 MED ORDER — OXYCODONE HCL 15 MG PO TABS: 15.0000 mg | ORAL_TABLET | Freq: Three times a day (TID) | ORAL | 0 refills | Status: DC | PRN

## 2018-02-06 NOTE — Progress Notes (Signed)
  Subjective:     Patient ID: [name], female   DOB: 1960/11/23, 57 y.o.   MRN: [id number]  HPI Patient seen for chronic back pain management follow-up. She's been on oxycodone for many years. She states that even taking this 3 times daily still some breakthrough pain. We have offered many times in the past referral to pain management but she declines. When we first took over her care she was actually on higher dose and also taking high-dose Xanax 2 mg 3 times a day. We have managed to taper this back to one 3 times a day and have recommended further tapering.  Indication for chronic opioid: Chronic back pain Medication and dose: Oxycodone 15 mg 3 times a day  # pills per month: 90 Last UDS date: 11/18 Opioid Treatment Agreement signed (Y/N): yes Opioid Treatment Agreement last reviewed with patient:  02-06-18 [place] reviewed this encounter (include red flags):  reviewed 02-06-18 no red flags.  Past Medical History:  Diagnosis Date  . ASTHMA UNSPECIFIED WITH EXACERBATION 07/21/2010  . CARPAL TUNNEL SYNDROME, BILATERAL 01/16/2008  . COPD 05/08/2008  . GERD 04/18/2007  . HELICOBACTER PYLORI GASTRITIS, HX OF 04/18/2007  . PANIC DISORDER 04/18/2007  . TOBACCO ABUSE 07/17/2009   Past Surgical History:  Procedure Laterality Date  . APPENDECTOMY    . OOPHORECTOMY      reports that she has been smoking cigarettes.  She has a 50.00 pack-year smoking history. She has never used smokeless tobacco. She reports that she does not drink alcohol or use drugs. family history includes Breast cancer in her paternal aunt; Cancer in her father; Clotting disorder in her brother; Diabetes in her mother; Heart disease in her father; Stomach cancer in her paternal uncle. Allergies  Allergen Reactions  . Aspirin     REACTION: had h. pylori due to too many goody powders. told not to take any more asa      Review of Systems  Constitutional: Negative for chills and fever.  Cardiovascular: Negative for chest  pain.  Musculoskeletal: Positive for arthralgias and back pain.  Skin: Negative for rash.  Neurological: Negative for weakness and numbness.       Objective:   Physical Exam  Constitutional: She appears well-developed and well-nourished.  Cardiovascular: Normal rate and regular rhythm.  Pulmonary/Chest: Effort normal and breath sounds normal. She has no wheezes. She has no rales.  Musculoskeletal: She exhibits no edema.  Neurological:  No focal strength deficits       Assessment:     Chronic low back pain    Plan:     -Pain medication refill for 3 months -New controlled medicine contract was signed today and reviewed. Patient had no concerns. -Controlled substance registry is reviewed and no concerns -Follow-up in 3 months consider as needed  Eulas Post MD [facility] at [facility]

## 2018-02-14 ENCOUNTER — Other Ambulatory Visit: Payer: Self-pay | Admitting: Family Medicine

## 2018-02-15 NOTE — Telephone Encounter (Signed)
Last refill given on 11/21/2017-#90 with 2 refills

## 2018-02-15 NOTE — Telephone Encounter (Signed)
Rx done. 

## 2018-02-15 NOTE — Telephone Encounter (Signed)
Refill for 3 months. 

## 2018-02-27 ENCOUNTER — Telehealth: Payer: Self-pay | Admitting: Family Medicine

## 2018-02-27 NOTE — Telephone Encounter (Signed)
Copied from [place] [id number]. Topic: Inquiry >> Feb 27, 2018  1:38 PM Margot Ables wrote: Reason for CRM: pt is going out of town and does not want to run out of her oxycodone. She is going to the beach 02/24/18-03/16/18. Her pharmacy states that RX cannot be filled until 03/06/18 unless doctor gives approval for early refill. She is wanting to fill on Friday 03/02/18. Please call to advise. Pt states she has the paper RX.  [id number] - [place], [place] - [address] [phone] (Phone) [phone] (Fax)

## 2018-02-27 NOTE — Telephone Encounter (Signed)
OK to refill early?

## 2018-02-28 NOTE — Telephone Encounter (Signed)
FYI

## 2018-02-28 NOTE — Telephone Encounter (Signed)
Pt states that she no longer needs the early refill, she is not going to the beach now.

## 2018-03-05 ENCOUNTER — Other Ambulatory Visit: Payer: Self-pay | Admitting: Family Medicine

## 2018-03-22 ENCOUNTER — Ambulatory Visit: Payer: BLUE CROSS/BLUE SHIELD | Admitting: Gastroenterology

## 2018-04-02 ENCOUNTER — Encounter: Payer: Self-pay | Admitting: Family Medicine

## 2018-04-02 ENCOUNTER — Ambulatory Visit: Payer: BLUE CROSS/BLUE SHIELD | Admitting: Family Medicine

## 2018-04-02 VITALS — BP 110/70 | HR 90 | Temp 97.8°F | Wt 135.9 lb

## 2018-04-02 DIAGNOSIS — R059 Cough, unspecified: Secondary | ICD-10-CM

## 2018-04-02 DIAGNOSIS — J441 Chronic obstructive pulmonary disease with (acute) exacerbation: Secondary | ICD-10-CM

## 2018-04-02 DIAGNOSIS — R05 Cough: Secondary | ICD-10-CM | POA: Diagnosis not present

## 2018-04-02 MED ORDER — BENZONATATE 100 MG PO CAPS: 100.0000 mg | ORAL_CAPSULE | Freq: Three times a day (TID) | ORAL | 1 refills | Status: DC | PRN

## 2018-04-02 MED ORDER — DOXYCYCLINE HYCLATE 100 MG PO CAPS
100.0000 mg | ORAL_CAPSULE | Freq: Two times a day (BID) | ORAL | 0 refills | Status: DC
Start: 2018-04-02 — End: 2018-05-02

## 2018-04-02 MED ORDER — METHYLPREDNISOLONE ACETATE 80 MG/ML IJ SUSP
80.0000 mg | Freq: Once | INTRAMUSCULAR | Status: AC
Start: 2018-04-02 — End: 2018-04-02
  Administered 2018-04-02: 80 mg via INTRAMUSCULAR

## 2018-04-02 NOTE — Patient Instructions (Signed)
Chronic Obstructive Pulmonary Disease Exacerbation Chronic obstructive pulmonary disease (COPD) is a long-term (chronic) condition that affects the lungs. COPD is a general term that can be used to describe many different lung problems that cause lung swelling (inflammation) and limit airflow, including chronic bronchitis and emphysema. COPD exacerbations are episodes when breathing symptoms become much worse and require extra treatment. COPD exacerbations are usually caused by infections. Without treatment, COPD exacerbations can be severe and even life threatening. Frequent COPD exacerbations can cause further damage to the lungs. What are the causes? This condition may be caused by:  Respiratory infections, including viral and bacterial infections.  Exposure to smoke.  Exposure to air pollution, chemical fumes, or dust.  Things that give you an allergic reaction (allergens).  Not taking your usual COPD medicines as directed.  Underlying medical problems, such as congestive heart failure or infections not involving the lungs.  In many cases, the cause (trigger) of this condition is not known. What increases the risk? The following factors may make you more likely to develop this condition:  Smoking cigarettes.  Old age.  Frequent prior COPD exacerbations.  What are the signs or symptoms? Symptoms of this condition include:  Increased coughing.  Increased production of mucus from your lungs (sputum).  Increased wheezing.  Increased shortness of breath.  Rapid or labored breathing.  Chest tightness.  Less energy than usual.  Sleep disruption from symptoms.  Confusion or increased sleepiness.  Often these symptoms happen or get worse even with the use of medicines. How is this diagnosed? This condition is diagnosed based on:  Your medical history.  A physical exam.  You may also have tests, including:  A chest X-ray.  Blood tests.  Lung (pulmonary)  function tests.  How is this treated? Treatment for this condition depends on the severity and cause of the symptoms. You may need to be admitted to a hospital for treatment. Some of the treatments commonly used to treat COPD exacerbations are:  Antibiotic medicines. These may be used for severe exacerbations caused by a lung infection, such as pneumonia.  Bronchodilators. These are inhaled medicines that expand the air passages and allow increased airflow.  Steroid medicines. These act to reduce inflammation in the airways. They may be given with an inhaler, taken by mouth, or given through an IV tube inserted into one of your veins.  Supplemental oxygen therapy.  Airway clearing techniques, such as noninvasive ventilation (NIV) and positive expiratory pressure (PEP). These provide respiratory support through a mask or other noninvasive device. An example of this would be using a continuous positive airway pressure (CPAP) machine to improve delivery of oxygen into your lungs.  Follow these instructions at home: Medicines  Take over-the-counter and prescription medicines only as told by your health care provider. It is important to use correct technique with inhaled medicines.  If you were prescribed an antibiotic medicine or oral steroid, take it as told by your health care provider. Do not stop taking the medicine even if you start to feel better. Lifestyle  Eat a healthy diet.  Exercise regularly.  Get plenty of sleep.  Avoid exposure to all substances that irritate the airway, especially to tobacco smoke.  Wash your hands often with soap and water to reduce the risk of infection. If soap and water are not available, use hand sanitizer.  During flu season, avoid enclosed spaces that are crowded with people. General instructions  Drink enough fluid to keep your urine clear or pale yellow (  unless you have a medical condition that requires fluid restriction).  Use a cool mist  vaporizer. This humidifies the air and makes it easier for you to clear your chest when you cough.  If you have a home nebulizer and oxygen, continue to use them as told by your health care provider.  Keep all follow-up visits as told by your health care provider. This is important. How is this prevented?  Stay up-to-date on pneumococcal and influenza (flu) vaccines. A flu shot is recommended every year to help prevent exacerbations.  Do not use any products that contain nicotine or tobacco, such as cigarettes and e-cigarettes. Quitting smoking is very important in preventing COPD from getting worse and in preventing exacerbations from happening as often. If you need help quitting, ask your health care provider.  Follow all instructions for pulmonary rehabilitation after a recent exacerbation. This can help prevent future exacerbations.  Work with your health care provider to develop and follow an action plan. This tells you what steps to take when you experience certain symptoms. Contact a health care provider if:  You have a worsening of your regular COPD symptoms. Get help right away if:  You have worsening shortness of breath, even when resting.  You have trouble talking.  You have severe chest pain.  You cough up blood.  You have a fever.  You have weakness, vomit repeatedly, or faint.  You feel confused.  You are not able to sleep because of your symptoms.  You have trouble doing daily activities. Summary  COPD exacerbations are episodes when breathing symptoms become much worse and require extra treatment above your normal treatment.  Exacerbations can be severe and even life threatening. Frequent COPD exacerbations can cause further damage to your lungs.  COPD exacerbations are usually triggered by infections such as the flu, colds, and even pneumonia.  Treatment for this condition depends on the severity and cause of the symptoms. You may need to be admitted to a  hospital for treatment.  Quitting smoking is very important to prevent COPD from getting worse and to prevent exacerbations from happening as often. This information is not intended to replace advice given to you by your health care provider. Make sure you discuss any questions you have with your health care provider. Document Released: 07/24/2007 Document Revised: 10/31/2016 Document Reviewed: 10/31/2016 Elsevier Interactive Patient Education  2018 [employer]  

## 2018-04-02 NOTE — Progress Notes (Signed)
  Subjective:     Patient ID: [name], female   DOB: 12-26-1960, 57 y.o.   MRN: [id number]  HPI Patient seen with cough and wheezing past couple weeks. Cough sometimes dry and sometimes productive. No fever. She is aware of some intermittent wheezing. She has inhaler for that. She has known COPD. No fever.  Past Medical History:  Diagnosis Date  . ASTHMA UNSPECIFIED WITH EXACERBATION 07/21/2010  . CARPAL TUNNEL SYNDROME, BILATERAL 01/16/2008  . COPD 05/08/2008  . GERD 04/18/2007  . HELICOBACTER PYLORI GASTRITIS, HX OF 04/18/2007  . PANIC DISORDER 04/18/2007  . TOBACCO ABUSE 07/17/2009   Past Surgical History:  Procedure Laterality Date  . APPENDECTOMY    . OOPHORECTOMY      reports that she has been smoking cigarettes.  She has a 50.00 pack-year smoking history. She has never used smokeless tobacco. She reports that she does not drink alcohol or use drugs. family history includes Breast cancer in her paternal aunt; Cancer in her father; Clotting disorder in her brother; Diabetes in her mother; Heart disease in her father; Stomach cancer in her paternal uncle. Allergies  Allergen Reactions  . Aspirin     REACTION: had h. pylori due to too many goody powders. told not to take any more asa     Review of Systems  Constitutional: Negative for chills and fever.  Respiratory: Positive for cough and wheezing.   Cardiovascular: Negative for chest pain.       Objective:   Physical Exam  Constitutional: She appears well-developed and well-nourished.  Cardiovascular: Normal rate and regular rhythm.  Pulmonary/Chest:  She has some expiratory wheezes. No retractions. Normal respiratory rate. No respiratory distress. Pulse oximetry 95%       Assessment:     Acute exacerbation of COPD    Plan:     -Depo-Medrol 80 mg IM given -Doxycycline 100 mm grams twice daily-for 10 days -Tessalon Perles 100 mg every 8 hours as needed -If cough not improving in one week consider CXR  Eulas Post MD [facility]

## 2018-04-06 ENCOUNTER — Other Ambulatory Visit: Payer: Self-pay | Admitting: Family Medicine

## 2018-04-23 ENCOUNTER — Telehealth: Payer: Self-pay | Admitting: Family Medicine

## 2018-04-23 DIAGNOSIS — R059 Cough, unspecified: Secondary | ICD-10-CM

## 2018-04-23 DIAGNOSIS — R05 Cough: Secondary | ICD-10-CM

## 2018-04-23 DIAGNOSIS — F17200 Nicotine dependence, unspecified, uncomplicated: Secondary | ICD-10-CM

## 2018-04-23 NOTE — Telephone Encounter (Signed)
She is a candidate based on age, current smoking status, and pack use history.  However, other criteria for low dose CT lung screening is asymptomatic patient.  If her cough is persisting she is really not a candidate for screening.  We would then have to look at whether to get diagnostic CT of lung.  Offer follow up to discuss.

## 2018-04-23 NOTE — Telephone Encounter (Signed)
Copied from [place] [id number]. Topic: Inquiry >> Apr 23, 2018 11:10 AM Margot Ables wrote: Reason for CRM: pt states that she has discussed low dose cancer screening with Dr. Elease Hashimoto due to persistant cough and that she is a smoker. Pt smokes 1 pack/day. Please call to advise if ordered.

## 2018-04-23 NOTE — Telephone Encounter (Signed)
Okay to order lung cancer screening?

## 2018-04-24 NOTE — Telephone Encounter (Signed)
Patient does have a cough and would like to proceed with a CT of the lungs.  Okay to order?

## 2018-04-24 NOTE — Telephone Encounter (Signed)
Before proceeding with CT, I would have her come back here for CXR first.

## 2018-04-25 NOTE — Telephone Encounter (Signed)
Patient is aware and chest xray order placed

## 2018-04-27 ENCOUNTER — Ambulatory Visit (INDEPENDENT_AMBULATORY_CARE_PROVIDER_SITE_OTHER): Payer: BLUE CROSS/BLUE SHIELD

## 2018-04-27 DIAGNOSIS — R05 Cough: Secondary | ICD-10-CM | POA: Diagnosis not present

## 2018-04-27 DIAGNOSIS — F17200 Nicotine dependence, unspecified, uncomplicated: Secondary | ICD-10-CM | POA: Diagnosis not present

## 2018-04-27 DIAGNOSIS — R059 Cough, unspecified: Secondary | ICD-10-CM

## 2018-05-02 ENCOUNTER — Ambulatory Visit: Payer: BLUE CROSS/BLUE SHIELD | Admitting: Family Medicine

## 2018-05-02 ENCOUNTER — Encounter: Payer: Self-pay | Admitting: Family Medicine

## 2018-05-02 VITALS — BP 110/78 | HR 77 | Temp 97.8°F | Wt 140.3 lb

## 2018-05-02 DIAGNOSIS — G8929 Other chronic pain: Secondary | ICD-10-CM

## 2018-05-02 DIAGNOSIS — R05 Cough: Secondary | ICD-10-CM | POA: Diagnosis not present

## 2018-05-02 DIAGNOSIS — F17200 Nicotine dependence, unspecified, uncomplicated: Secondary | ICD-10-CM

## 2018-05-02 DIAGNOSIS — M545 Low back pain: Secondary | ICD-10-CM | POA: Diagnosis not present

## 2018-05-02 DIAGNOSIS — R053 Chronic cough: Secondary | ICD-10-CM

## 2018-05-02 MED ORDER — OXYCODONE HCL 15 MG PO TABS: 15.0000 mg | ORAL_TABLET | Freq: Three times a day (TID) | ORAL | 0 refills | Status: DC | PRN

## 2018-05-02 NOTE — Progress Notes (Signed)
  Subjective:     Patient ID: [name], female   DOB: 04-27-1961, 57 y.o.   MRN: [id number]  HPI Here to discuss the following  Cough she estimates for the past 3 months. Long history of smoking. We obtain chest x-ray a few days ago that showed no acute changes. Well over 30 pack year history. She's had suspected COPD in the past but no history of formal lung functions recently.  Cough is dry. No hemoptysis. No weight loss. She does have history of GERD and currently on Omeprazole 20 mg daily. She notices her cough is worse when lying down. Frequently eats right before bedtime. Occasional postnasal drip symptoms. Takes about 4-5 cups of coffee per day.  Chronic back pain. She is on oxycodone 15 mg 3 times daily and has been on this for many years. No recent constipation issues. Still has occasional breakthrough pain. She has declined pain management in the past.  Past Medical History:  Diagnosis Date  . ASTHMA UNSPECIFIED WITH EXACERBATION 07/21/2010  . CARPAL TUNNEL SYNDROME, BILATERAL 01/16/2008  . COPD 05/08/2008  . GERD 04/18/2007  . HELICOBACTER PYLORI GASTRITIS, HX OF 04/18/2007  . PANIC DISORDER 04/18/2007  . TOBACCO ABUSE 07/17/2009   Past Surgical History:  Procedure Laterality Date  . APPENDECTOMY    . OOPHORECTOMY      reports that she has been smoking cigarettes.  She has a 50.00 pack-year smoking history. She has never used smokeless tobacco. She reports that she does not drink alcohol or use drugs. family history includes Breast cancer in her paternal aunt; Cancer in her father; Clotting disorder in her brother; Diabetes in her mother; Heart disease in her father; Stomach cancer in her paternal uncle. Allergies  Allergen Reactions  . Aspirin     REACTION: had h. pylori due to too many goody powders. told not to take any more asa     Review of Systems  Constitutional: Negative for appetite change, chills, fever and unexpected weight change.  Respiratory: Positive for  cough. Negative for chest tightness.   Cardiovascular: Negative for chest pain, palpitations and leg swelling.  Gastrointestinal: Negative for abdominal pain and constipation.  Musculoskeletal: Positive for back pain and neck pain.       Objective:   Physical Exam  Constitutional: She is oriented to person, place, and time. She appears well-developed and well-nourished.  HENT:  Mouth/Throat: Oropharynx is clear and moist.  Cardiovascular: Normal rate and regular rhythm.  Pulmonary/Chest:  Slightly diminished breath sounds throughout but clear  Musculoskeletal: She exhibits no edema.  Neurological: She is alert and oriented to person, place, and time.       Assessment:     #1 chronic cough. Suspect COPD. She's not had any red flags such as hemoptysis or weight loss. Recent chest x-ray no acute findings.    #2 ongoing nicotine use  #3 chronic back pain-stable    Plan:     -Increase omeprazole to 40 mg daily -Start over-the-counter Flonase 2 sprays per nostril once daily -Increase head of bed at least 4-6 inches and avoid eating within 2-3 hours of bedtime -Gradually reduce caffeine intake -Set up spirometry for further evaluation and hopefully to help guide medication selection for her COPD. She currently only takes as needed albuterol -Refill oxycodone for 3 months and will plan three-month follow-up  Eulas Post MD [facility]

## 2018-05-02 NOTE — Patient Instructions (Signed)
Increase the Omeprazole to 40 mg daily  Gradually decrease caffeine  Elevate head of bed 4-6 inches  We are going to set up lung function tests.    Avoid eating within 2-3 hours of bedtime.

## 2018-05-15 ENCOUNTER — Telehealth: Payer: Self-pay | Admitting: Family Medicine

## 2018-05-15 NOTE — Telephone Encounter (Signed)
Last seen 05/02/2018 Medication last refilled on 02/15/2018 with 90 tablets with 2 refills. No to exceed 3 additional refills before 05/20/2018.    Dr. Elease Hashimoto, please advise if ok to refill the alprazolam.  Thank you.

## 2018-05-15 NOTE — Telephone Encounter (Signed)
Refill with 2 additional refills. 

## 2018-05-16 NOTE — Telephone Encounter (Signed)
Called the pharmacy and left message on the machine for the refill of the medication requested.  Ok per Dr. Elease Hashimoto.

## 2018-05-17 ENCOUNTER — Telehealth: Payer: Self-pay | Admitting: Family Medicine

## 2018-05-17 NOTE — Telephone Encounter (Signed)
Pt states that the [facility] needs [facility] to [facility] Dr. Elease Hashimoto is aware the pt is on opioids and [facility]anax per [facility] rules.

## 2018-05-17 NOTE — Telephone Encounter (Signed)
Copied from [place] [id number]. Topic: Inquiry >> May 17, 2018 12:07 PM [name] wrote: Reason for CRM: pt called to get an update on the "lung test" her pcp is suppose to perform; contact pt to advise

## 2018-05-17 NOTE — Telephone Encounter (Signed)
Pharmacy waiting for medication    Copied from [place] [id number]. Topic: Quick Communication - Rx Refill/Question >> May 17, 2018 12:05 PM [name] wrote: Medication: ALPRAZolam Duanne Moron) 1 MG tablet [[id number]]   Has the patient contacted their pharmacy? Yes.   (Agent: If no, request that the patient contact the pharmacy for the refill.) (Agent: If yes, when and what did the pharmacy advise?)  [facility]  Agent: Please be advised that RX refills may take up to 3 business days. We ask that you follow-up with your pharmacy.

## 2018-05-17 NOTE — Telephone Encounter (Signed)
Needs a call back from [place] to authorize Xanax with Oxycodone 15mg .

## 2018-05-17 NOTE — Telephone Encounter (Signed)
I spoke with pharmacy, they will get script ready and I spoke with patient.

## 2018-05-18 ENCOUNTER — Other Ambulatory Visit: Payer: BLUE CROSS/BLUE SHIELD

## 2018-05-18 ENCOUNTER — Encounter: Payer: Self-pay | Admitting: Gastroenterology

## 2018-05-18 ENCOUNTER — Ambulatory Visit: Payer: BLUE CROSS/BLUE SHIELD | Admitting: Gastroenterology

## 2018-05-18 VITALS — BP 126/76 | HR 82 | Ht 67.0 in | Wt 140.5 lb

## 2018-05-18 DIAGNOSIS — G8929 Other chronic pain: Secondary | ICD-10-CM | POA: Diagnosis not present

## 2018-05-18 DIAGNOSIS — R05 Cough: Secondary | ICD-10-CM

## 2018-05-18 DIAGNOSIS — R197 Diarrhea, unspecified: Secondary | ICD-10-CM | POA: Diagnosis not present

## 2018-05-18 DIAGNOSIS — R1013 Epigastric pain: Secondary | ICD-10-CM | POA: Diagnosis not present

## 2018-05-18 DIAGNOSIS — R053 Chronic cough: Secondary | ICD-10-CM

## 2018-05-18 NOTE — Telephone Encounter (Signed)
Pt called and stated that Pharmacy Is needing to confirm with dr that the pt is taking Xanex and Pain med together.  Pharmacy told her it was a new process that the pharmacy was going to have to call and get approval every time   [facility] [place]

## 2018-05-18 NOTE — Telephone Encounter (Signed)
She has been on both of these for several years.  We are trying to taper down her Xanax  She can take together.

## 2018-05-18 NOTE — Patient Instructions (Signed)
If you are age 59 or older, your body mass index should be between 23-30. Your Body mass index is 22.01 kg/m. If this is out of the aforementioned range listed, please consider follow up with your Primary Care Provider.  If you are age 47 or younger, your body mass index should be between 19-25. Your Body mass index is 22.01 kg/m. If this is out of the aformentioned range listed, please consider follow up with your Primary Care Provider.   You have been scheduled for an endoscopy. Please follow written instructions given to you at your visit today. If you use inhalers (even only as needed), please bring them with you on the day of your procedure. Your physician has requested that you go to www.startemmi.com and enter the access code given to you at your visit today. This web site gives a general overview about your procedure. However, you should still follow specific instructions given to you by our office regarding your preparation for the procedure.  Your provider has requested that you go to the basement level for lab work before leaving today. Press "B" on the elevator. The lab is located at the first door on the left as you exit the elevator.   It was a pleasure to see you today!  Dr. Loletha Carrow

## 2018-05-18 NOTE — Progress Notes (Signed)
[facility] Consult Note:  History: [name] 05/18/2018  Referring physician: Eulas Post, MD  Reason for consult/chief complaint: Cough (constant. would like to discuss egd); Abdominal Pain (epigastric); and Diarrhea (loose. Worse in the morning.)   Subjective  HPI:  This is a pleasant 57 year old woman referred by primary care and accompanied by her daughter for multiple GI symptoms and chronic cough.  She has COPD with continued tobacco use (but is trying to cut down), and over the last several months has had a severe chronic cough that seems much worse first thing in the morning.  She apparently coughed so much she feels lightheaded and it hurts her upper abdomen.  She has had "GERD" for many years by which she means of burning epigastric discomfort for which she has been on various PPIs.  At recent primary care visit there was concern that perhaps GERD was the cause of her cough, so she was referred to [place] for that.  She seems to recall previous H. pylori treatment, but no details are available about that.  She previously used [employer], but no longer takes aspirin or NSAIDs.  Also over the last month or so she has had loose stool in the morning with some urgency.  There is no rectal bleeding, her appetite has reportedly been good and weight stable.  She denies hemoptysis, but her voice is hoarse.  [name] underwent screening colonoscopy with Dr. Deatra Ina in August 2015, this was a normal study except for sigmoid diverticulosis.  She also reports significantly increased stress over the last year, and says that stress always aggravates her upper abdominal pain. ROS:  Review of Systems  Constitutional: Positive for fatigue. Negative for appetite change and unexpected weight change.       Night sweats  HENT: Negative for mouth sores and voice change.   Eyes: Negative for pain and redness.  Respiratory: Positive for cough and shortness of breath.     Cardiovascular: Negative for chest pain and palpitations.  Genitourinary: Negative for dysuria and hematuria.  Musculoskeletal: Positive for back pain. Negative for arthralgias and myalgias.  Skin: Negative for pallor and rash.  Neurological: Negative for weakness and headaches.  Hematological: Negative for adenopathy.     Past Medical History: Past Medical History:  Diagnosis Date  . ASTHMA UNSPECIFIED WITH EXACERBATION 07/21/2010  . CARPAL TUNNEL SYNDROME, BILATERAL 01/16/2008  . COPD 05/08/2008  . GERD 04/18/2007  . HELICOBACTER PYLORI GASTRITIS, HX OF 04/18/2007  . PANIC DISORDER 04/18/2007  . TOBACCO ABUSE 07/17/2009     Past Surgical History: Past Surgical History:  Procedure Laterality Date  . APPENDECTOMY    . OOPHORECTOMY       Family History: Family History  Problem Relation Age of Onset  . Diabetes Mother   . Cancer Father        lung smoke   . Heart disease Father   . Clotting disorder Brother   . Breast cancer Paternal Aunt   . Stomach cancer Paternal Uncle     Social History: Social History   Socioeconomic History  . Marital status: Divorced    Spouse name: Not on file  . Number of children: 1  . Years of education: Not on file  . Highest education level: Not on file  Occupational History  . Occupation: Forensic psychologist: SELF-EMPLOYED  Social Needs  . Financial resource strain: Not on file  . Food insecurity:    Worry: Not on file  Inability: Not on file  . Transportation needs:    Medical: Not on file    Non-medical: Not on file  Tobacco Use  . Smoking status: Current Every Day Smoker    Packs/day: 1.00    Years: 50.00    Pack years: 50.00    Types: Cigarettes  . Smokeless tobacco: Never Used  . Tobacco comment: Trying nicotine patches, made her nauseous at first, going to cut patches in half.  Substance and Sexual Activity  . Alcohol use: No  . Drug use: No  . Sexual activity: Not on file  Lifestyle  . Physical activity:    Days per  week: Not on file    Minutes per session: Not on file  . Stress: Not on file  Relationships  . Social connections:    Talks on phone: Not on file    Gets together: Not on file    Attends religious service: Not on file    Active member of club or organization: Not on file    Attends meetings of clubs or organizations: Not on file    Relationship status: Not on file  Other Topics Concern  . Not on file  Social History Narrative   lives with bf    cna cares of elderly person   Self employed    Long term smoker    Allergies: Allergies  Allergen Reactions  . Aspirin     REACTION: had h. pylori due to too many goody powders. told not to take any more asa    Outpatient Meds: Current Outpatient Medications  Medication Sig Dispense Refill  . Albuterol Sulfate (PROAIR RESPICLICK) 329 (90 Base) MCG/ACT AEPB Inhale 2 puffs into the lungs every 4 (four) hours as needed. 1 each 1  . ALPRAZolam (XANAX) 1 MG tablet TAKE 1 TABLET BY MOUTH 3 TIMES A DAY AS NEEDED 90 tablet 2  . atorvastatin (LIPITOR) 40 MG tablet Take 1 tablet (40 mg total) by mouth daily. 90 tablet 1  . benzonatate (TESSALON) 100 MG capsule Take 1 capsule (100 mg total) by mouth 3 (three) times daily as needed for cough. 30 capsule 1  . omeprazole (PRILOSEC) 20 MG capsule TAKE ONE CAPSULE BY MOUTH EVERY DAY (Patient taking differently: Take 20 mg by mouth 2 (two) times daily. ) 90 capsule 1  . oxyCODONE (ROXICODONE) 15 MG immediate release tablet Take 1 tablet (15 mg total) by mouth every 8 (eight) hours as needed for pain. 90 tablet 0  . SUMAtriptan (IMITREX) 50 MG tablet TAKE 1 TABLET AS NEEDED FOR MIGRAINE MAY REPEAT IN 2 HRS IF HEADACHE PERSISTS 10 tablet 0  . VENTOLIN HFA 108 (90 Base) MCG/ACT inhaler INHALE 2 PUFFS INTO LUNGS EVERY 4 HOURS AS NEEDED FOR WHEEZING OR SHORTNESS OF BREATH 18 Inhaler 2   No current facility-administered medications for this visit.        ___________________________________________________________________ Objective   Exam:  BP 126/76   Pulse 82   Ht 5\' 7"  (1.702 m)   Wt 140 lb 8 oz (63.7 kg)   BMI 22.01 kg/m    General: this is a(n) woman in no acute distress, breathing comfortably, gravelly vocal quality  Eyes: sclera anicteric, no redness  ENT: oral mucosa moist without lesions, no cervical or supraclavicular lymphadenopathy, good dentition.  She sounds congested and is sniffling during the exam.  CV: RRR without murmur, S1/S2, no JVD, no peripheral edema  Resp: clear to auscultation bilaterally, normal RR and effort noted  GI: soft,  mild epigastric tenderness, with active bowel sounds. No guarding or palpable organomegaly noted.  Skin; warm and dry, no rash or jaundice noted  Neuro: awake, alert and oriented x 3. Normal gross motor function and fluent speech  Labs:  CBC Latest Ref Rng & Units 02/02/2018 04/07/2017 02/10/2014  WBC 3.8 - 10.8 Thousand/uL 7.4 8.0 10.6(H)  Hemoglobin 11.7 - 15.5 g/dL 13.7 13.4 15.1(H)  Hematocrit 35.0 - 45.0 % 40.0 39.7 43.6  Platelets 140 - 400 Thousand/uL 248 262.0 250.0   CMP Latest Ref Rng & Units 02/02/2018 04/07/2017 03/02/2015  Glucose 65 - 99 mg/dL 96 100(H) 97  BUN 7 - 25 mg/dL 12 13 15   Creatinine 0.50 - 1.05 mg/dL 0.73 0.73 0.74  Sodium 135 - 146 mmol/L 138 138 133(L)  Potassium 3.5 - 5.3 mmol/L 4.3 4.4 3.9  Chloride 98 - 110 mmol/L 103 101 97  CO2 20 - 32 mmol/L 27 32 31  Calcium 8.6 - 10.4 mg/dL 9.6 9.5 9.6  Total Protein 6.1 - 8.1 g/dL 7.3 7.2 7.8  Total Bilirubin 0.2 - 1.2 mg/dL 0.5 0.4 0.4  Alkaline Phos 39 - 117 U/L - 78 85  AST 10 - 35 U/L 17 14 21   ALT 6 - 29 U/L 18 21 19      Radiologic Studies:  Recent CXR with coarse interstitial markings, no infliltrate  Assessment: Encounter Diagnoses  Name Primary?  . Abdominal pain, chronic, epigastric Yes  . Chronic cough   . Diarrhea of presumed infectious origin     Recent onset diarrhea  of unclear cause.  She received doxycycline in June for chronic cough.  She has epigastric pain that sounds as if it is been present for years with some regurgitation symptoms as well.  That said, I do not think GERD is the most likely explanation for her cough.  Plan:  C. difficile PCR Upper endoscopy for epigastric pain and history of H. pylori.  She is agreeable after discussion of procedure and risks. Patient at increased risk for cardiopulmonary complications of procedure due to medical comorbidities, especially COPD.  I have asked her to be in contact with primary care because pulmonary and ENT consult should be considered in a long-standing smoker with recently worsening chronic cough.   Thank you for the courtesy of this consult.  Please call me with any questions or concerns.  Nelida Meuse III  CC: Eulas Post, MD

## 2018-05-18 NOTE — Telephone Encounter (Signed)
[facility] and informed [name] of the message below.  [name] stated he will call the pt to inform her of this as well.

## 2018-05-18 NOTE — Telephone Encounter (Signed)
Pt called [facility] and is "very upset" that no one has contacted her pharmacy to authorize her Xanax refill. They are needing confirmation that pt is ok to be on both Oxy and Xanax.   Dr. Elease Hashimoto - Please advise ASAP. Thank you!

## 2018-05-18 NOTE — Telephone Encounter (Signed)
This has been addressed in separate note and sent to my nurse.

## 2018-05-21 NOTE — Telephone Encounter (Signed)
Patient was seen on 7/15 and there was a spirometry ordered for the pt.  Dr. Elease Hashimoto, would you like the patient to be set up for a  PFT instead of the spirometry?  Please advise. Thanks

## 2018-05-21 NOTE — Telephone Encounter (Signed)
Spirometry .  thanks

## 2018-05-21 NOTE — Telephone Encounter (Signed)
Spoke with pharmacy and prescription for Xanax was picked up 05/19/18.  Left message on machine for patient confirming.

## 2018-05-22 ENCOUNTER — Other Ambulatory Visit: Payer: BLUE CROSS/BLUE SHIELD

## 2018-05-22 DIAGNOSIS — R197 Diarrhea, unspecified: Secondary | ICD-10-CM

## 2018-05-22 DIAGNOSIS — R05 Cough: Secondary | ICD-10-CM

## 2018-05-22 DIAGNOSIS — R1013 Epigastric pain: Principal | ICD-10-CM

## 2018-05-22 DIAGNOSIS — G8929 Other chronic pain: Secondary | ICD-10-CM | POA: Diagnosis not present

## 2018-05-22 DIAGNOSIS — R053 Chronic cough: Secondary | ICD-10-CM

## 2018-05-23 ENCOUNTER — Telehealth: Payer: Self-pay | Admitting: Gastroenterology

## 2018-05-23 ENCOUNTER — Encounter: Payer: Self-pay | Admitting: Gastroenterology

## 2018-05-23 ENCOUNTER — Ambulatory Visit (AMBULATORY_SURGERY_CENTER): Payer: BLUE CROSS/BLUE SHIELD | Admitting: Gastroenterology

## 2018-05-23 VITALS — BP 152/76 | HR 70 | Temp 96.4°F | Resp 21 | Ht 67.0 in | Wt 140.0 lb

## 2018-05-23 DIAGNOSIS — K3189 Other diseases of stomach and duodenum: Secondary | ICD-10-CM

## 2018-05-23 DIAGNOSIS — K229 Disease of esophagus, unspecified: Secondary | ICD-10-CM | POA: Diagnosis not present

## 2018-05-23 DIAGNOSIS — R1013 Epigastric pain: Secondary | ICD-10-CM | POA: Diagnosis not present

## 2018-05-23 DIAGNOSIS — K228 Other specified diseases of esophagus: Secondary | ICD-10-CM | POA: Diagnosis not present

## 2018-05-23 LAB — CLOSTRIDIUM DIFFICILE TOXIN B, QUALITATIVE, REAL-TIME PCR: Toxigenic C. Difficile by PCR: NOT DETECTED

## 2018-05-23 MED ORDER — SODIUM CHLORIDE 0.9 % IV SOLN: 500.0000 mL | Freq: Once | INTRAVENOUS | Status: DC

## 2018-05-23 NOTE — Op Note (Signed)
[place] Patient Name: [name] Procedure Date: 05/23/2018 10:26 AM MRN: [id number] Endoscopist: Mallie Mussel L. Loletha Carrow , MD Age: 57 Referring MD:  Date of Birth: 08-Apr-1961 Gender: Female Account #: [id number] Procedure:                Upper GI endoscopy Indications:              Epigastric abdominal pain, Previously treated for                            Helicobacter pylori, Chronic cough in a smoker -                            worsening for several months Medicines:                Monitored Anesthesia Care Procedure:                Pre-Anesthesia Assessment:                           - Prior to the procedure, a History and Physical                            was performed, and patient medications and                            allergies were reviewed. The patient's tolerance of                            previous anesthesia was also reviewed. The risks                            and benefits of the procedure and the sedation                            options and risks were discussed with the patient.                            All questions were answered, and informed consent                            was obtained. Prior Anticoagulants: The patient has                            taken no previous anticoagulant or antiplatelet                            agents. ASA Grade Assessment: II - A patient with                            mild systemic disease. After reviewing the risks                            and benefits, the patient was deemed in  satisfactory condition to undergo the procedure.                           After obtaining informed consent, the endoscope was                            passed under direct vision. Throughout the                            procedure, the patient's blood pressure, pulse, and                            oxygen saturations were monitored continuously. The                            Model GIF-HQ190  [id number]) scope was introduced                            through the mouth, and advanced to the second part                            of duodenum. The upper GI endoscopy was                            accomplished without difficulty. The patient                            tolerated the procedure well. Scope In: Scope Out: Findings:                 An area of slightly raised erythema was found on                            the left vocal cord.                           A few 4 to 6 mm slightly raised, benign-appearing                            mucosal nodules were found in the middle third of                            the esophagus. A single cold biopsy was taken with                            a cold forceps for histology.                           The exam of the esophagus was otherwise normal.                           The entire examined stomach was normal. Biopsies  were taken with a cold forceps for histology to                            rule out persistant H. pylori infection. ([facility]                            protocol).                           The cardia and gastric fundus were normal on                            retroflexion.                           The examined duodenum was normal. Complications:            No immediate complications. Estimated Blood Loss:     Estimated blood loss was minimal. Impression:               - Erythema was visualized on the left vocal cord.                            Unclear clinical significance and relation to                            chronic cough.                           - Mucosal nodule found in the esophagus. Biopsied.                           - Normal stomach. Biopsied.                           - Normal examined duodenum.                           See recent office note for details - chronic cough                            unlikely to be reflux-related. Recommendation:           - Patient has a  contact number available for                            emergencies. The signs and symptoms of potential                            delayed complications were discussed with the                            patient. Return to normal activities tomorrow.                            Written discharge instructions were provided to the  patient.                           - Resume previous diet.                           - Continue present medications.                           - Await pathology results.                           - Refer to an ENT specialist at appointment to be                            scheduled for fiberoptic exam of vocal cord finding                            and evaluation of chronic cough. Ashtin Rosner L. Loletha Carrow, MD 05/23/2018 10:52:30 AM This report has been signed electronically.

## 2018-05-23 NOTE — Progress Notes (Signed)
A/ox3 pleased with MAC, report to RN 

## 2018-05-23 NOTE — Telephone Encounter (Signed)
[name],  Please send referral to [facility] (any MD) for vocal cord abnormality on EGD, smoker, chronic cough.  Please send my recent office note and EGD report with referral.  Patient has a color copy of report to bring to the appointment as well.  Dr. Elease Hashimoto,   copied to you [place]

## 2018-05-23 NOTE — Progress Notes (Signed)
Called to room to assist during endoscopic procedure.  Patient ID and intended procedure confirmed with present staff. Received instructions for my participation in the procedure from the performing physician.  

## 2018-05-23 NOTE — Telephone Encounter (Signed)
Records have been faxed to [facility]. Referral also placed in proficient. Will await appointment information.

## 2018-05-23 NOTE — Telephone Encounter (Signed)
Spoke with Patrice at the Pulmonary office and she has contacted the patient and scheduled her spirometry for 8.21 at 4pm.  notihing further is needed.

## 2018-05-23 NOTE — Patient Instructions (Signed)
YOU HAD AN ENDOSCOPIC PROCEDURE TODAY AT [facility]:   Refer to the procedure report that was given to you for any specific questions about what was found during the examination.  If the procedure report does not answer your questions, please call your gastroenterologist to clarify.  If you requested that your care partner not be given the details of your procedure findings, then the procedure report has been included in a sealed envelope for you to review at your convenience later.  YOU SHOULD EXPECT: Some feelings of bloating in the abdomen. Passage of more gas than usual.  Walking can help get rid of the air that was put into your GI tract during the procedure and reduce the bloating. If you had a lower endoscopy (such as a colonoscopy or flexible sigmoidoscopy) you may notice spotting of blood in your stool or on the toilet paper. If you underwent a bowel prep for your procedure, you may not have a normal bowel movement for a few days.  Please Note:  You might notice some irritation and congestion in your nose or some drainage.  This is from the oxygen used during your procedure.  There is no need for concern and it should clear up in a day or so.  SYMPTOMS TO REPORT IMMEDIATELY:    Following upper endoscopy (EGD)  Vomiting of blood or coffee ground material  New chest pain or pain under the shoulder blades  Painful or persistently difficult swallowing  New shortness of breath  Fever of 100F or higher  Black, tarry-looking stools  For urgent or emergent issues, a gastroenterologist can be reached at any hour by calling [phone].   DIET:  We do recommend a small meal at first, but then you may proceed to your regular diet.  Drink plenty of fluids but you should avoid alcoholic beverages for 24 hours.  ACTIVITY:  You should plan to take it easy for the rest of today and you should NOT DRIVE or use heavy machinery until tomorrow (because of the sedation medicines used  during the test).    FOLLOW UP: Our staff will call the number listed on your records the next business day following your procedure to check on you and address any questions or concerns that you may have regarding the information given to you following your procedure. If we do not reach you, we will leave a message.  However, if you are feeling well and you are not experiencing any problems, there is no need to return our call.  We will assume that you have returned to your regular daily activities without incident.  If any biopsies were taken you will be contacted by phone or by letter within the next 1-3 weeks.  Please call us at [phone] if you have not heard about the biopsies in 3 weeks.    SIGNATURES/CONFIDENTIALITY: You and/or your care partner have signed paperwork which will be entered into your electronic medical record.  These signatures attest to the fact that that the information above on your After Visit Summary has been reviewed and is understood.  Full responsibility of the confidentiality of this discharge information lies with you and/or your care-partner.  To be referred to ENT , Dr. Loletha Carrow office will contact you.

## 2018-05-24 ENCOUNTER — Telehealth: Payer: Self-pay

## 2018-05-24 ENCOUNTER — Telehealth: Payer: Self-pay | Admitting: Family Medicine

## 2018-05-24 NOTE — Telephone Encounter (Signed)
Copied from [place] [id number]. Topic: General - Other >> May 24, 2018  1:42 PM Cecelia Byars, NT wrote: Reason for CRM: Patient called and said her pain meds  are not do to be filled until the 05/30/18 she wants to get them on the 17th instead f she is going out of  town and the pharmacy said they need a call from the practice to fill early ,ok this  please call her if this will be done [phone]

## 2018-05-24 NOTE — Telephone Encounter (Signed)
  Follow up Call-  Call back number 05/23/2018  Post procedure Call Back phone  # [phone]  Permission to leave phone message Yes  Some recent data might be hidden     Patient questions:  Do you have a fever, pain , or abdominal swelling? No. Pain Score  0 *  Have you tolerated food without any problems? Yes.    Have you been able to return to your normal activities? Yes.    Do you have any questions about your discharge instructions: Diet   No. Medications  No. Follow up visit  No.  Do you have questions or concerns about your Care? No.  Actions: * If pain score is 4 or above: No action needed, pain <4.

## 2018-05-25 ENCOUNTER — Telehealth: Payer: Self-pay | Admitting: Gastroenterology

## 2018-05-25 ENCOUNTER — Other Ambulatory Visit: Payer: Self-pay

## 2018-05-25 MED ORDER — DICYCLOMINE HCL 10 MG PO CAPS: 10.0000 mg | ORAL_CAPSULE | Freq: Two times a day (BID) | ORAL | 2 refills | Status: DC | PRN

## 2018-05-25 MED ORDER — HYOSCYAMINE SULFATE 0.125 MG PO TABS: 0.1250 mg | ORAL_TABLET | Freq: Every day | ORAL | 2 refills | Status: DC

## 2018-05-25 NOTE — Telephone Encounter (Signed)
OK 

## 2018-05-25 NOTE — Telephone Encounter (Signed)
Done. Pt has been notified and aware.

## 2018-05-25 NOTE — Telephone Encounter (Signed)
Please advise 

## 2018-05-25 NOTE — Telephone Encounter (Signed)
Pt just called to inform that levsin is not covered by her insurance and wants to know if she can have something different.

## 2018-05-25 NOTE — Telephone Encounter (Signed)
Spoke with pharmacist and early refill was approved.

## 2018-05-25 NOTE — Telephone Encounter (Signed)
Dr. Elease Hashimoto please advise if ok to refill patients pain meds on the 17 th (early) instead of the 21 st when they are due.  Pt stated that she is going out of town.  Thanks

## 2018-05-25 NOTE — Telephone Encounter (Signed)
Dicyclomine 10 mg, one capsule twice daily as needed.  Disp #60, RF 2

## 2018-05-29 ENCOUNTER — Encounter: Payer: Self-pay | Admitting: Gastroenterology

## 2018-05-29 ENCOUNTER — Encounter: Payer: BLUE CROSS/BLUE SHIELD | Admitting: Student

## 2018-06-06 ENCOUNTER — Other Ambulatory Visit: Payer: Self-pay | Admitting: Family Medicine

## 2018-06-06 DIAGNOSIS — R0600 Dyspnea, unspecified: Secondary | ICD-10-CM

## 2018-06-06 NOTE — Telephone Encounter (Signed)
Per [facility]. Message was left on 05-29-2018 to call and schedule. I reached out to the patient. The number to the office was given and she will set up a visit.

## 2018-06-07 ENCOUNTER — Ambulatory Visit (INDEPENDENT_AMBULATORY_CARE_PROVIDER_SITE_OTHER): Payer: BLUE CROSS/BLUE SHIELD | Admitting: Internal Medicine

## 2018-06-07 DIAGNOSIS — R0600 Dyspnea, unspecified: Secondary | ICD-10-CM

## 2018-06-07 LAB — PULMONARY FUNCTION TEST
FEF 25-75 PRE: 0.61 L/s
FEF2575-%PRED-PRE: 25 %
FEV1-%Pred-Pre: 47 %
FEV1-PRE: 1.28 L
FEV1FVC-%Pred-Pre: 73 %
FEV6-%Pred-Pre: 65 %
FEV6-Pre: 2.17 L
FEV6FVC-%Pred-Pre: 100 %
FVC-%PRED-PRE: 64 %
FVC-Pre: 2.25 L
PRE FEV1/FVC RATIO: 57 %
Pre FEV6/FVC Ratio: 97 %

## 2018-06-07 NOTE — Progress Notes (Signed)
Spirometry done today. 

## 2018-06-13 DIAGNOSIS — K219 Gastro-esophageal reflux disease without esophagitis: Secondary | ICD-10-CM | POA: Insufficient documentation

## 2018-06-13 DIAGNOSIS — J449 Chronic obstructive pulmonary disease, unspecified: Secondary | ICD-10-CM | POA: Diagnosis not present

## 2018-06-13 DIAGNOSIS — J383 Other diseases of vocal cords: Secondary | ICD-10-CM | POA: Diagnosis not present

## 2018-06-13 DIAGNOSIS — F17200 Nicotine dependence, unspecified, uncomplicated: Secondary | ICD-10-CM | POA: Diagnosis not present

## 2018-06-14 NOTE — Telephone Encounter (Signed)
Pt saw Dr. Janace Hoard on 06-13-2018.

## 2018-06-16 ENCOUNTER — Other Ambulatory Visit: Payer: Self-pay | Admitting: Gastroenterology

## 2018-06-20 ENCOUNTER — Ambulatory Visit: Payer: BLUE CROSS/BLUE SHIELD | Admitting: Family Medicine

## 2018-06-20 ENCOUNTER — Encounter: Payer: Self-pay | Admitting: Family Medicine

## 2018-06-20 VITALS — BP 120/84 | HR 83 | Temp 98.0°F

## 2018-06-20 DIAGNOSIS — F17200 Nicotine dependence, unspecified, uncomplicated: Secondary | ICD-10-CM | POA: Diagnosis not present

## 2018-06-20 DIAGNOSIS — Z23 Encounter for immunization: Secondary | ICD-10-CM

## 2018-06-20 DIAGNOSIS — J383 Other diseases of vocal cords: Secondary | ICD-10-CM | POA: Diagnosis not present

## 2018-06-20 DIAGNOSIS — J449 Chronic obstructive pulmonary disease, unspecified: Secondary | ICD-10-CM | POA: Diagnosis not present

## 2018-06-20 MED ORDER — BUDESONIDE-FORMOTEROL FUMARATE 80-4.5 MCG/ACT IN AERO: 2.0000 | INHALATION_SPRAY | Freq: Two times a day (BID) | RESPIRATORY_TRACT | 5 refills | Status: DC

## 2018-06-20 NOTE — Progress Notes (Signed)
  Subjective:     Patient ID: [name], female   DOB: Dec 21, 1960, 57 y.o.   MRN: [id number]  HPI Patient seen to review recent pulmonary function test. She has about a 50 pack year history smoking and still smokes about pack of cigarettes per day. She has frequent cough and productive cough especially early morning. She has generally had at least a few exacerbations per year of cough and wheezing but no hospitalizations.  She's had some recent progressive hoarseness and had nasolaryngoscopy per ENT with apparently some leukoplakia. She is scheduled for surgery for further biopsy and evaluation 06/29/18.  Patient states she was tried briefly previously on Spiriva but she's not sure if it made much difference. She currently has rescue inhaler only. She had recent spirometry with FEV1/FVC ratio 57. She has gotten benefit with bronchodilator therapy in the past. She does not recall any long-acting bronchodilator medications. She has noted benefit of taking steroids in the past for acute exacerbations.  She needs flu vaccine today. She's had previous Pneumovax couple years ago  Past Medical History:  Diagnosis Date  . ASTHMA UNSPECIFIED WITH EXACERBATION 07/21/2010  . CARPAL TUNNEL SYNDROME, BILATERAL 01/16/2008  . COPD 05/08/2008  . GERD 04/18/2007  . HELICOBACTER PYLORI GASTRITIS, HX OF 04/18/2007  . PANIC DISORDER 04/18/2007  . TOBACCO ABUSE 07/17/2009   Past Surgical History:  Procedure Laterality Date  . APPENDECTOMY    . OOPHORECTOMY      reports that she has been smoking cigarettes. She has a 50.00 pack-year smoking history. She has never used smokeless tobacco. She reports that she does not drink alcohol or use drugs. family history includes Breast cancer in her paternal aunt; Cancer in her father; Clotting disorder in her brother; Diabetes in her mother; Heart disease in her father; Stomach cancer in her paternal uncle. Allergies  Allergen Reactions  . Aspirin     REACTION: had h.  pylori due to too many goody powders. told not to take any more asa     Review of Systems  Constitutional: Negative for appetite change, chills, fever and unexpected weight change.  HENT: Positive for voice change. Negative for sore throat and trouble swallowing.   Respiratory: Positive for cough and shortness of breath. Negative for wheezing.   Cardiovascular: Negative for chest pain, palpitations and leg swelling.       Objective:   Physical Exam  Constitutional: She appears well-developed and well-nourished.  Cardiovascular: Normal rate and regular rhythm.  Pulmonary/Chest:  Slightly diminished breath sounds throughout. No wheezes. No rales. No respiratory distress. Pulse oximetry 97%  Musculoskeletal: She exhibits no edema.       Assessment:     #1 COPD. Recent FEV1/FVC ratio 57%. Ongoing nicotine use.  GOLD 2, group B.    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 We gave her # for 1- 800 quit line. -flu vaccine given -She's had prior Pneumovax -We recommend starting Symbicort 80 2 puffs twice daily and rinse mouth after use.  She has had fairly frequent exacerbations in past - but not requiring hospitalization.  She has responded well to steroids frequently in past. -continue rescue inhaler as needed  Eulas Post MD [facility] at [facility]

## 2018-06-20 NOTE — Patient Instructions (Signed)
Steps to Quit Smoking Smoking tobacco can be harmful to your health and can affect almost every organ in your body. Smoking puts you, and those around you, at risk for developing many serious chronic diseases. Quitting smoking is difficult, but it is one of the best things that you can do for your health. It is never too late to quit. What are the benefits of quitting smoking? When you quit smoking, you lower your risk of developing serious diseases and conditions, such as:  Lung cancer or lung disease, such as COPD.  Heart disease.  Stroke.  Heart attack.  Infertility.  Osteoporosis and bone fractures.  Additionally, symptoms such as coughing, wheezing, and shortness of breath may get better when you quit. You may also find that you get sick less often because your body is stronger at fighting off colds and infections. If you are pregnant, quitting smoking can help to reduce your chances of having a baby of low birth weight. How do I get ready to quit? When you decide to quit smoking, create a plan to make sure that you are successful. Before you quit:  Pick a date to quit. Set a date within the next two weeks to give you time to prepare.  Write down the reasons why you are quitting. Keep this list in places where you will see it often, such as on your bathroom mirror or in your car or wallet.  Identify the people, places, things, and activities that make you want to smoke (triggers) and avoid them. Make sure to take these actions: ? Throw away all cigarettes at home, at work, and in your car. ? Throw away smoking accessories, such as ashtrays and lighters. ? Clean your car and make sure to empty the ashtray. ? Clean your home, including curtains and carpets.  Tell your family, friends, and coworkers that you are quitting. Support from your loved ones can make quitting easier.  Talk with your health care provider about your options for quitting smoking.  Find out what treatment  options are covered by your health insurance.  What strategies can I use to quit smoking? Talk with your healthcare provider about different strategies to quit smoking. Some strategies include:  Quitting smoking altogether instead of gradually lessening how much you smoke over a period of time. Research shows that quitting "cold turkey" is more successful than gradually quitting.  Attending in-person counseling to help you build problem-solving skills. You are more likely to have success in quitting if you attend several counseling sessions. Even short sessions of 10 minutes can be effective.  Finding resources and support systems that can help you to quit smoking and remain smoke-free after you quit. These resources are most helpful when you use them often. They can include: ? Online chats with a counselor. ? Telephone quitlines. ? Printed self-help materials. ? Support groups or group counseling. ? Text messaging programs. ? Mobile phone applications.  Taking medicines to help you quit smoking. (If you are pregnant or breastfeeding, talk with your health care provider first.) Some medicines contain nicotine and some do not. Both types of medicines help with cravings, but the medicines that include nicotine help to relieve withdrawal symptoms. Your health care provider may recommend: ? Nicotine patches, gum, or lozenges. ? Nicotine inhalers or sprays. ? Non-nicotine medicine that is taken by mouth.  Talk with your health care provider about combining strategies, such as taking medicines while you are also receiving in-person counseling. Using these two strategies together   makes you more likely to succeed in quitting than if you used either strategy on its own. If you are pregnant or breastfeeding, talk with your health care provider about finding counseling or other support strategies to quit smoking. Do not take medicine to help you quit smoking unless told to do so by your health care  provider. What things can I do to make it easier to quit? Quitting smoking might feel overwhelming at first, but there is a lot that you can do to make it easier. Take these important actions:  Reach out to your family and friends and ask that they support and encourage you during this time. Call telephone quitlines, reach out to support groups, or work with a counselor for support.  Ask people who smoke to avoid smoking around you.  Avoid places that trigger you to smoke, such as bars, parties, or smoke-break areas at work.  Spend time around people who do not smoke.  Lessen stress in your life, because stress can be a smoking trigger for some people. To lessen stress, try: ? Exercising regularly. ? Deep-breathing exercises. ? Yoga. ? Meditating. ? Performing a body scan. This involves closing your eyes, scanning your body from head to toe, and noticing which parts of your body are particularly tense. Purposefully relax the muscles in those areas.  Download or purchase mobile phone or tablet apps (applications) that can help you stick to your quit plan by providing reminders, tips, and encouragement. There are many free apps, such as QuitGuide from the CDC (Centers for Disease Control and Prevention). You can find other support for quitting smoking (smoking cessation) through smokefree.gov and other websites.  How will I feel when I quit smoking? Within the first 24 hours of quitting smoking, you may start to feel some withdrawal symptoms. These symptoms are usually most noticeable 2-3 days after quitting, but they usually do not last beyond 2-3 weeks. Changes or symptoms that you might experience include:  Mood swings.  Restlessness, anxiety, or irritation.  Difficulty concentrating.  Dizziness.  Strong cravings for sugary foods in addition to nicotine.  Mild weight gain.  Constipation.  Nausea.  Coughing or a sore throat.  Changes in how your medicines work in your  body.  A depressed mood.  Difficulty sleeping (insomnia).  After the first 2-3 weeks of quitting, you may start to notice more positive results, such as:  Improved sense of smell and taste.  Decreased coughing and sore throat.  Slower heart rate.  Lower blood pressure.  Clearer skin.  The ability to breathe more easily.  Fewer sick days.  Quitting smoking is very challenging for most people. Do not get discouraged if you are not successful the first time. Some people need to make many attempts to quit before they achieve long-term success. Do your best to stick to your quit plan, and talk with your health care provider if you have any questions or concerns. This information is not intended to replace advice given to you by your health care provider. Make sure you discuss any questions you have with your health care provider. Document Released: 09/20/2001 Document Revised: 05/24/2016 Document Reviewed: 02/10/2015 Elsevier Interactive Patient Education  2018 Elsevier Inc.  

## 2018-06-29 ENCOUNTER — Other Ambulatory Visit: Payer: Self-pay | Admitting: Otolaryngology

## 2018-06-29 DIAGNOSIS — J387 Other diseases of larynx: Secondary | ICD-10-CM | POA: Diagnosis not present

## 2018-06-29 DIAGNOSIS — K219 Gastro-esophageal reflux disease without esophagitis: Secondary | ICD-10-CM | POA: Diagnosis not present

## 2018-06-29 DIAGNOSIS — J383 Other diseases of vocal cords: Secondary | ICD-10-CM | POA: Diagnosis not present

## 2018-07-03 ENCOUNTER — Ambulatory Visit: Payer: BLUE CROSS/BLUE SHIELD | Admitting: Gastroenterology

## 2018-07-12 DIAGNOSIS — J383 Other diseases of vocal cords: Secondary | ICD-10-CM | POA: Diagnosis not present

## 2018-07-18 ENCOUNTER — Other Ambulatory Visit: Payer: Self-pay

## 2018-07-18 ENCOUNTER — Ambulatory Visit: Payer: BLUE CROSS/BLUE SHIELD | Admitting: Family Medicine

## 2018-07-18 ENCOUNTER — Encounter: Payer: Self-pay | Admitting: Family Medicine

## 2018-07-18 VITALS — BP 114/74 | HR 84 | Temp 97.8°F | Ht 67.0 in | Wt 142.1 lb

## 2018-07-18 DIAGNOSIS — J441 Chronic obstructive pulmonary disease with (acute) exacerbation: Secondary | ICD-10-CM

## 2018-07-18 DIAGNOSIS — R053 Chronic cough: Secondary | ICD-10-CM

## 2018-07-18 DIAGNOSIS — R05 Cough: Secondary | ICD-10-CM | POA: Diagnosis not present

## 2018-07-18 MED ORDER — DOXYCYCLINE HYCLATE 100 MG PO CAPS: 100.0000 mg | ORAL_CAPSULE | Freq: Two times a day (BID) | ORAL | 0 refills | Status: DC

## 2018-07-18 MED ORDER — METHYLPREDNISOLONE ACETATE 80 MG/ML IJ SUSP
80.0000 mg | Freq: Once | INTRAMUSCULAR | Status: AC
  Administered 2018-07-18: 80 mg via INTRAMUSCULAR

## 2018-07-18 NOTE — Patient Instructions (Signed)
Follow up immediately for any fever or increased shortness of breath.    Call by Friday if still wheezing.

## 2018-07-18 NOTE — Progress Notes (Signed)
  Subjective:     Patient ID: [name], female   DOB: 1961/08/17, 57 y.o.   MRN: [id number]  HPI COPD and recently started on Symbicort.  She has had several weeks of coughing.  She has had some chronic hoarseness and had recent vocal cord biopsy couple weeks ago.  Her cough has been mostly productive of clear sputum.  No hemoptysis.  No fevers or chills.  She has used her rescue inhaler with temporary improvement.  Past Medical History:  Diagnosis Date  . ASTHMA UNSPECIFIED WITH EXACERBATION 07/21/2010  . CARPAL TUNNEL SYNDROME, BILATERAL 01/16/2008  . COPD 05/08/2008  . GERD 04/18/2007  . HELICOBACTER PYLORI GASTRITIS, HX OF 04/18/2007  . PANIC DISORDER 04/18/2007  . TOBACCO ABUSE 07/17/2009   Past Surgical History:  Procedure Laterality Date  . APPENDECTOMY    . OOPHORECTOMY      reports that she has been smoking cigarettes. She has a 50.00 pack-year smoking history. She has never used smokeless tobacco. She reports that she does not drink alcohol or use drugs. family history includes Breast cancer in her paternal aunt; Cancer in her father; Clotting disorder in her brother; Diabetes in her mother; Heart disease in her father; Stomach cancer in her paternal uncle. Allergies  Allergen Reactions  . Aspirin     REACTION: had h. pylori due to too many goody powders. told not to take any more asa     Review of Systems  Constitutional: Negative for chills and fever.  HENT: Negative for sinus pressure and sinus pain.   Respiratory: Positive for cough, shortness of breath and wheezing.   Cardiovascular: Negative for chest pain, palpitations and leg swelling.       Objective:   Physical Exam  Constitutional: She appears well-developed and well-nourished.  Neck: Neck supple.  Cardiovascular: Normal rate and regular rhythm.  Pulmonary/Chest:  Patient has diffuse expiratory wheezes.  No retractions.  Pulse oximetry 92%  Musculoskeletal: She exhibits no edema.       Assessment:      Acute exacerbation of COPD    Plan:     -Depo-Medrol 80 mg IM given -Doxycycline 100 mg twice daily for 10 days -Follow-up immediately for any fever or increased shortness of breath -Continue rescue inhaler as needed -Continue Symbicort 2 puffs twice daily  Eulas Post MD [facility] at [facility]

## 2018-07-23 ENCOUNTER — Telehealth: Payer: Self-pay | Admitting: Family Medicine

## 2018-07-23 NOTE — Telephone Encounter (Signed)
Copied from [place] [id number]. Topic: Quick Communication - Rx Refill/Question >> Jul 23, 2018  6:43 PM Blase Mess A wrote: Medication: oxyCODONE (ROXICODONE) 15 MG immediate release tablet [[id number]] Patient has appt on 07/31/18, Patient will run out on 07/28/18 Has the patient contacted their pharmacy? Yes  (Agent: If no, request that the patient contact the pharmacy for the refill.) (Agent: If yes, when and what did the pharmacy advise?)  Preferred Pharmacy (with phone number or street name): [id number] - [place], [place] - [address] [place] [address] Phone: [phone] Fax: [phone]    Agent: Please be advised that RX refills may take up to 3 business days. We ask that you follow-up with your pharmacy.

## 2018-07-24 MED ORDER — OXYCODONE HCL 15 MG PO TABS: 15.0000 mg | ORAL_TABLET | Freq: Four times a day (QID) | ORAL | 0 refills | Status: DC | PRN

## 2018-07-24 NOTE — Telephone Encounter (Signed)
Called patient and she stated that she only needs enough Oxycodone to get her from October 19th to October 22nd when she has her appointment with Dr. Elease Hashimoto.  Last OV 07/18/18, Next OV 07/31/18  Last filled 05/02/18, # 90 with 0 refills, early fill  Please see message.

## 2018-07-24 NOTE — Telephone Encounter (Signed)
Called patient and let her know that her prescription has been filled and to make sure she contacts the doctor who did her recent procedure for her trachea and let them know that she still does not have her voice back and she is worried that she is straining to talk. Patient stated that she is still on the antibiotic from Dr. Elease Hashimoto and she is feeling a little better.

## 2018-07-24 NOTE — Telephone Encounter (Signed)
Done

## 2018-07-31 ENCOUNTER — Ambulatory Visit: Payer: BLUE CROSS/BLUE SHIELD | Admitting: Family Medicine

## 2018-07-31 ENCOUNTER — Encounter: Payer: Self-pay | Admitting: Family Medicine

## 2018-07-31 ENCOUNTER — Other Ambulatory Visit: Payer: Self-pay | Admitting: Family Medicine

## 2018-07-31 ENCOUNTER — Other Ambulatory Visit: Payer: Self-pay

## 2018-07-31 ENCOUNTER — Encounter

## 2018-07-31 VITALS — BP 120/70 | HR 90 | Temp 97.9°F | Ht 67.0 in | Wt 142.8 lb

## 2018-07-31 DIAGNOSIS — M545 Low back pain, unspecified: Secondary | ICD-10-CM

## 2018-07-31 DIAGNOSIS — E785 Hyperlipidemia, unspecified: Secondary | ICD-10-CM | POA: Diagnosis not present

## 2018-07-31 DIAGNOSIS — R5383 Other fatigue: Secondary | ICD-10-CM | POA: Diagnosis not present

## 2018-07-31 DIAGNOSIS — G8929 Other chronic pain: Secondary | ICD-10-CM | POA: Diagnosis not present

## 2018-07-31 DIAGNOSIS — J449 Chronic obstructive pulmonary disease, unspecified: Secondary | ICD-10-CM | POA: Diagnosis not present

## 2018-07-31 LAB — CBC WITH DIFFERENTIAL/PLATELET
BASOS PCT: 0.5 % (ref 0.0–3.0)
Basophils Absolute: 0 10*3/uL (ref 0.0–0.1)
EOS ABS: 0.1 10*3/uL (ref 0.0–0.7)
EOS PCT: 1 % (ref 0.0–5.0)
HCT: 41.4 % (ref 36.0–46.0)
Hemoglobin: 14.1 g/dL (ref 12.0–15.0)
LYMPHS ABS: 2 10*3/uL (ref 0.7–4.0)
Lymphocytes Relative: 28.8 % (ref 12.0–46.0)
MCHC: 34 g/dL (ref 30.0–36.0)
MCV: 94.7 fl (ref 78.0–100.0)
MONO ABS: 0.5 10*3/uL (ref 0.1–1.0)
Monocytes Relative: 7.6 % (ref 3.0–12.0)
NEUTROS PCT: 62.1 % (ref 43.0–77.0)
Neutro Abs: 4.4 10*3/uL (ref 1.4–7.7)
PLATELETS: 289 10*3/uL (ref 150.0–400.0)
RBC: 4.37 Mil/uL (ref 3.87–5.11)
RDW: 13.8 % (ref 11.5–15.5)
WBC: 7.1 10*3/uL (ref 4.0–10.5)

## 2018-07-31 LAB — BASIC METABOLIC PANEL
BUN: 16 mg/dL (ref 6–23)
CALCIUM: 9.5 mg/dL (ref 8.4–10.5)
CO2: 31 mEq/L (ref 19–32)
Chloride: 99 mEq/L (ref 96–112)
Creatinine, Ser: 0.75 mg/dL (ref 0.40–1.20)
GFR: 84.03 mL/min (ref >60.00)
GLUCOSE: 95 mg/dL (ref 70–99)
POTASSIUM: 4.1 meq/L (ref 3.5–5.1)
SODIUM: 136 meq/L (ref 135–145)

## 2018-07-31 LAB — LIPID PANEL
CHOLESTEROL: 256 mg/dL — AB (ref 0–200)
HDL: 64.1 mg/dL (ref >39.00)
NonHDL: 191.76
Total CHOL/HDL Ratio: 4
Triglycerides: 219 mg/dL — ABNORMAL HIGH (ref 0.0–149.0)
VLDL: 43.8 mg/dL — AB (ref 0.0–40.0)

## 2018-07-31 LAB — HEPATIC FUNCTION PANEL
ALK PHOS: 82 U/L (ref 39–117)
ALT: 17 U/L (ref 0–35)
AST: 10 U/L (ref 0–37)
Albumin: 4.4 g/dL (ref 3.5–5.2)
BILIRUBIN TOTAL: 0.4 mg/dL (ref 0.2–1.2)
Bilirubin, Direct: 0.1 mg/dL (ref 0.0–0.3)
Total Protein: 7.7 g/dL (ref 6.0–8.3)

## 2018-07-31 LAB — TSH: TSH: 1.07 u[IU]/mL (ref 0.35–4.50)

## 2018-07-31 LAB — LDL CHOLESTEROL, DIRECT: LDL DIRECT: 161 mg/dL

## 2018-07-31 MED ORDER — OXYCODONE HCL 15 MG PO TABS: ORAL_TABLET | ORAL | 0 refills | Status: DC

## 2018-07-31 MED ORDER — OXYCODONE HCL 15 MG PO TABS: 15.0000 mg | ORAL_TABLET | Freq: Four times a day (QID) | ORAL | 0 refills | Status: DC | PRN

## 2018-07-31 MED ORDER — TIOTROPIUM BROMIDE MONOHYDRATE 18 MCG IN CAPS: 18.0000 ug | ORAL_CAPSULE | Freq: Every day | RESPIRATORY_TRACT | 12 refills | Status: DC

## 2018-07-31 NOTE — Patient Instructions (Signed)
Start the Spiriva one inhalation once daily  Continue with the Symbicort.

## 2018-07-31 NOTE — Progress Notes (Signed)
Subjective:     Patient ID: [name], female   DOB: 1961/05/28, 57 y.o.   MRN: [id number]  HPI Patient here for the following issues  Chronic back pain.  She has been on oxycodone for several years.  Currently takes 50 mg every 6 hours.  She had 1 week refill last week in my absence.  She had urine drug screen about a year ago.  Drug contract signed in April.  Her back pain is been relatively stable.  She states that she still has poor control at times even with oxycodone.  She has declined pain management referral in the past.  COPD.  She had pulmonary functions few months ago.  We started Symbicort.  She is seen only minimal improvement thus far.  Still takes albuterol as needed.  Recent COPD exacerbation improved after antibiotics and steroids.  She complains of some general fatigue.  She thinks some of this may be stress related.  She has a daughter with complicated MRSA infection of the upper extremity is currently hospitalized.  Patient is working full-time.  Hyperlipidemia treated with Lipitor.  She is due for follow-up lipids.  Still smokes about 1/2 pack cigarettes per day and is trying to scale back  Indication for chronic opioid: chronic back pain. Medication and dose: oxycodone 15 mg po q6 hours # pills per month: 120 Last UDS date: today Opioid Treatment Agreement signed (Y/N): y Opioid Treatment Agreement last reviewed with patient:  4/19 [place] reviewed this encounter (include red flags):  07/31/18 no red flags.   Past Medical History:  Diagnosis Date  . ASTHMA UNSPECIFIED WITH EXACERBATION 07/21/2010  . CARPAL TUNNEL SYNDROME, BILATERAL 01/16/2008  . COPD 05/08/2008  . GERD 04/18/2007  . HELICOBACTER PYLORI GASTRITIS, HX OF 04/18/2007  . PANIC DISORDER 04/18/2007  . TOBACCO ABUSE 07/17/2009   Past Surgical History:  Procedure Laterality Date  . APPENDECTOMY    . OOPHORECTOMY      reports that she has been smoking cigarettes. She has a 50.00 pack-year smoking  history. She has never used smokeless tobacco. She reports that she does not drink alcohol or use drugs. family history includes Breast cancer in her paternal aunt; Cancer in her father; Clotting disorder in her brother; Diabetes in her mother; Heart disease in her father; Stomach cancer in her paternal uncle. Allergies  Allergen Reactions  . Aspirin     REACTION: had h. pylori due to too many goody powders. told not to take any more asa     Review of Systems  Constitutional: Positive for fatigue.  HENT: Positive for voice change.   Eyes: Negative for visual disturbance.  Respiratory: Negative for cough, chest tightness and wheezing.   Cardiovascular: Negative for chest pain, palpitations and leg swelling.  Genitourinary: Negative for dysuria.  Musculoskeletal: Positive for back pain.  Neurological: Negative for dizziness, seizures, syncope, weakness, light-headedness and headaches.       Objective:   Physical Exam  Constitutional: She appears well-developed and well-nourished.  Neck: Neck supple. No thyromegaly present.  Cardiovascular: Normal rate.  Pulmonary/Chest:  Somewhat diminished breath sounds throughout but no active wheezing  Musculoskeletal: She exhibits no edema.  Lymphadenopathy:    She has no cervical adenopathy.  Psychiatric:  PHQ-9 score of 8       Assessment:     #1 chronic back pain stable on chronic oxycodone therapy  #2 COPD.  Ongoing nicotine use  #3 dyslipidemia  #4 fatigue    Plan:     -  Refilled her oxycodone for 3 months -Check labs with lipid panel, hepatic panel, CBC, TSH, basic metabolic panel -Advised total cessation from smoking.  She has tried Wellbutrin in the past without success.  We did briefly discuss Chantix -We will plan routine follow-up in 3 months and sooner as needed -add Spiriva one inhalation once daily.  Continue with Symbicort bid  Eulas Post MD [facility] at [facility]

## 2018-08-04 LAB — PAIN MGMT, PROFILE 8 W/CONF, U
6 Acetylmorphine: NEGATIVE ng/mL (ref <10)
ALCOHOL METABOLITES: NEGATIVE ng/mL (ref <500)
Amphetamines: NEGATIVE ng/mL (ref <500)
BENZODIAZEPINES: NEGATIVE ng/mL (ref <100)
BUPRENORPHINE, URINE: NEGATIVE ng/mL (ref <5)
Cocaine Metabolite: NEGATIVE ng/mL (ref <150)
Creatinine: 159.1 mg/dL
MDMA: NEGATIVE ng/mL (ref <500)
Marijuana Metabolite: NEGATIVE ng/mL (ref <20)
NOROXYCODONE: 318 ng/mL — AB (ref <50)
OPIATES: NEGATIVE ng/mL (ref <100)
OXIDANT: NEGATIVE ug/mL (ref <200)
OXYMORPHONE: 246 ng/mL — AB (ref <50)
Oxycodone: 503 ng/mL — ABNORMAL HIGH (ref <50)
Oxycodone: POSITIVE ng/mL — AB (ref <100)
pH: 6.02 (ref 4.5–9.0)

## 2018-08-06 ENCOUNTER — Other Ambulatory Visit: Payer: Self-pay

## 2018-08-06 MED ORDER — ATORVASTATIN CALCIUM 40 MG PO TABS: 40.0000 mg | ORAL_TABLET | Freq: Every day | ORAL | 1 refills | Status: DC

## 2018-08-08 ENCOUNTER — Other Ambulatory Visit: Payer: Self-pay

## 2018-08-08 ENCOUNTER — Ambulatory Visit: Payer: BLUE CROSS/BLUE SHIELD | Admitting: Family Medicine

## 2018-08-08 ENCOUNTER — Encounter: Payer: Self-pay | Admitting: Family Medicine

## 2018-08-08 VITALS — BP 102/68 | HR 82 | Temp 98.0°F | Ht 67.0 in | Wt 146.6 lb

## 2018-08-08 DIAGNOSIS — F411 Generalized anxiety disorder: Secondary | ICD-10-CM | POA: Diagnosis not present

## 2018-08-08 NOTE — Progress Notes (Signed)
  Subjective:     Patient ID: [name], female   DOB: March 28, 1961, 57 y.o.   MRN: [id number]  HPI Patient is seen for follow-up regarding inconsistent lab results.  She has been on Xanax and oxycodone for several years.  We had managed to taper her Xanax back but she was still taking 1 mg 3 times daily.  She had drug screen which was positive for oxycodone but negative for alprazolam.  She states that she had one day last week where she had some respiratory symptoms and did not take her alprazolam but otherwise states she has not missed any other doses.  She also claims that when she submitted urine sample she may have had insufficient volume.  We explained that the lab has criteria for what is a sufficient specimen and this should have been kicked out of this was insufficient quantity.  Past Medical History:  Diagnosis Date  . ASTHMA UNSPECIFIED WITH EXACERBATION 07/21/2010  . CARPAL TUNNEL SYNDROME, BILATERAL 01/16/2008  . COPD 05/08/2008  . GERD 04/18/2007  . HELICOBACTER PYLORI GASTRITIS, HX OF 04/18/2007  . PANIC DISORDER 04/18/2007  . TOBACCO ABUSE 07/17/2009   Past Surgical History:  Procedure Laterality Date  . APPENDECTOMY    . OOPHORECTOMY      reports that she has been smoking cigarettes. She has a 50.00 pack-year smoking history. She has never used smokeless tobacco. She reports that she does not drink alcohol or use drugs. family history includes Breast cancer in her paternal aunt; Cancer in her father; Clotting disorder in her brother; Diabetes in her mother; Heart disease in her father; Stomach cancer in her paternal uncle. Allergies  Allergen Reactions  . Aspirin     REACTION: had h. pylori due to too many goody powders. told not to take any more asa     Review of Systems  Psychiatric/Behavioral: The patient is nervous/anxious.        Objective:   Physical Exam  Constitutional:  Patient is alert and anxious in appearance.       Assessment:     History of chronic  anxiety on chronic benzodiazepines.  Recent drug screen negative for benzodiazepines.  No valid explanation since these should have been positive on drug screen for several days- especially given her dose of 1 mg 3 times daily    Plan:     -We will check with lab to verify that this was sufficient quantity. -We would not be refilling her alprazolam further if there is no valid explanation for negative result  Eulas Post MD [facility] at [facility]

## 2018-08-10 ENCOUNTER — Telehealth: Payer: Self-pay | Admitting: Family Medicine

## 2018-08-10 NOTE — Telephone Encounter (Signed)
Copied from [place] [id number]. Topic: Quick Communication - See Telephone Encounter >> Aug 10, 2018 12:28 PM Bea Graff, NT wrote: CRM for notification. See Telephone encounter for: 08/10/18. Pt would like a call to discuss the xanax. She states she is unsure why the xanax did not show up in her system and she states she has been taking them. She would like to see what the plan for her will be. Please advise.

## 2018-08-13 ENCOUNTER — Other Ambulatory Visit: Payer: Self-pay

## 2018-08-13 DIAGNOSIS — F411 Generalized anxiety disorder: Secondary | ICD-10-CM

## 2018-08-13 NOTE — Telephone Encounter (Signed)
Yes.  We can put in referral to psychiatry- though it might take a while.   I really think it would be in her best long term interest to get off the Xanax for reasons we have discussed previously.

## 2018-08-13 NOTE — Telephone Encounter (Signed)
Please see message.  Please advise. 

## 2018-08-13 NOTE — Telephone Encounter (Signed)
Called patient and gave her message from Dr. Elease Hashimoto.  Patient verbalized an understanding.  Referral has been placed.

## 2018-08-13 NOTE — Telephone Encounter (Signed)
I did discuss her situation with the reference lab.  She (patient) disputed whether there was adequate quantity of urine.  The lab though explained that they only need about 7 ml of urine and if quantity had not been sufficient they would have rejected it.  We will not be able to prescribe the Xanax any further.  With taking Xanax 1 mg tid, the Xanax should have showed up in her urine for several days.

## 2018-08-13 NOTE — Telephone Encounter (Signed)
Called patient and patient is asking if she can have a referral to a psychiatrist or to someone who can help?  Please advise.

## 2018-08-24 ENCOUNTER — Other Ambulatory Visit: Payer: Self-pay | Admitting: Family Medicine

## 2018-09-12 DIAGNOSIS — J383 Other diseases of vocal cords: Secondary | ICD-10-CM | POA: Diagnosis not present

## 2018-09-12 DIAGNOSIS — K219 Gastro-esophageal reflux disease without esophagitis: Secondary | ICD-10-CM | POA: Diagnosis not present

## 2018-09-14 IMAGING — DX DG CHEST 2V
2 series · 2 of 2 positions shown · non-contrast
Comparison: 03/18/2015

CLINICAL DATA: Cough and shortness of breath for several months

EXAM:
CHEST  2 VIEW

[chest pa]
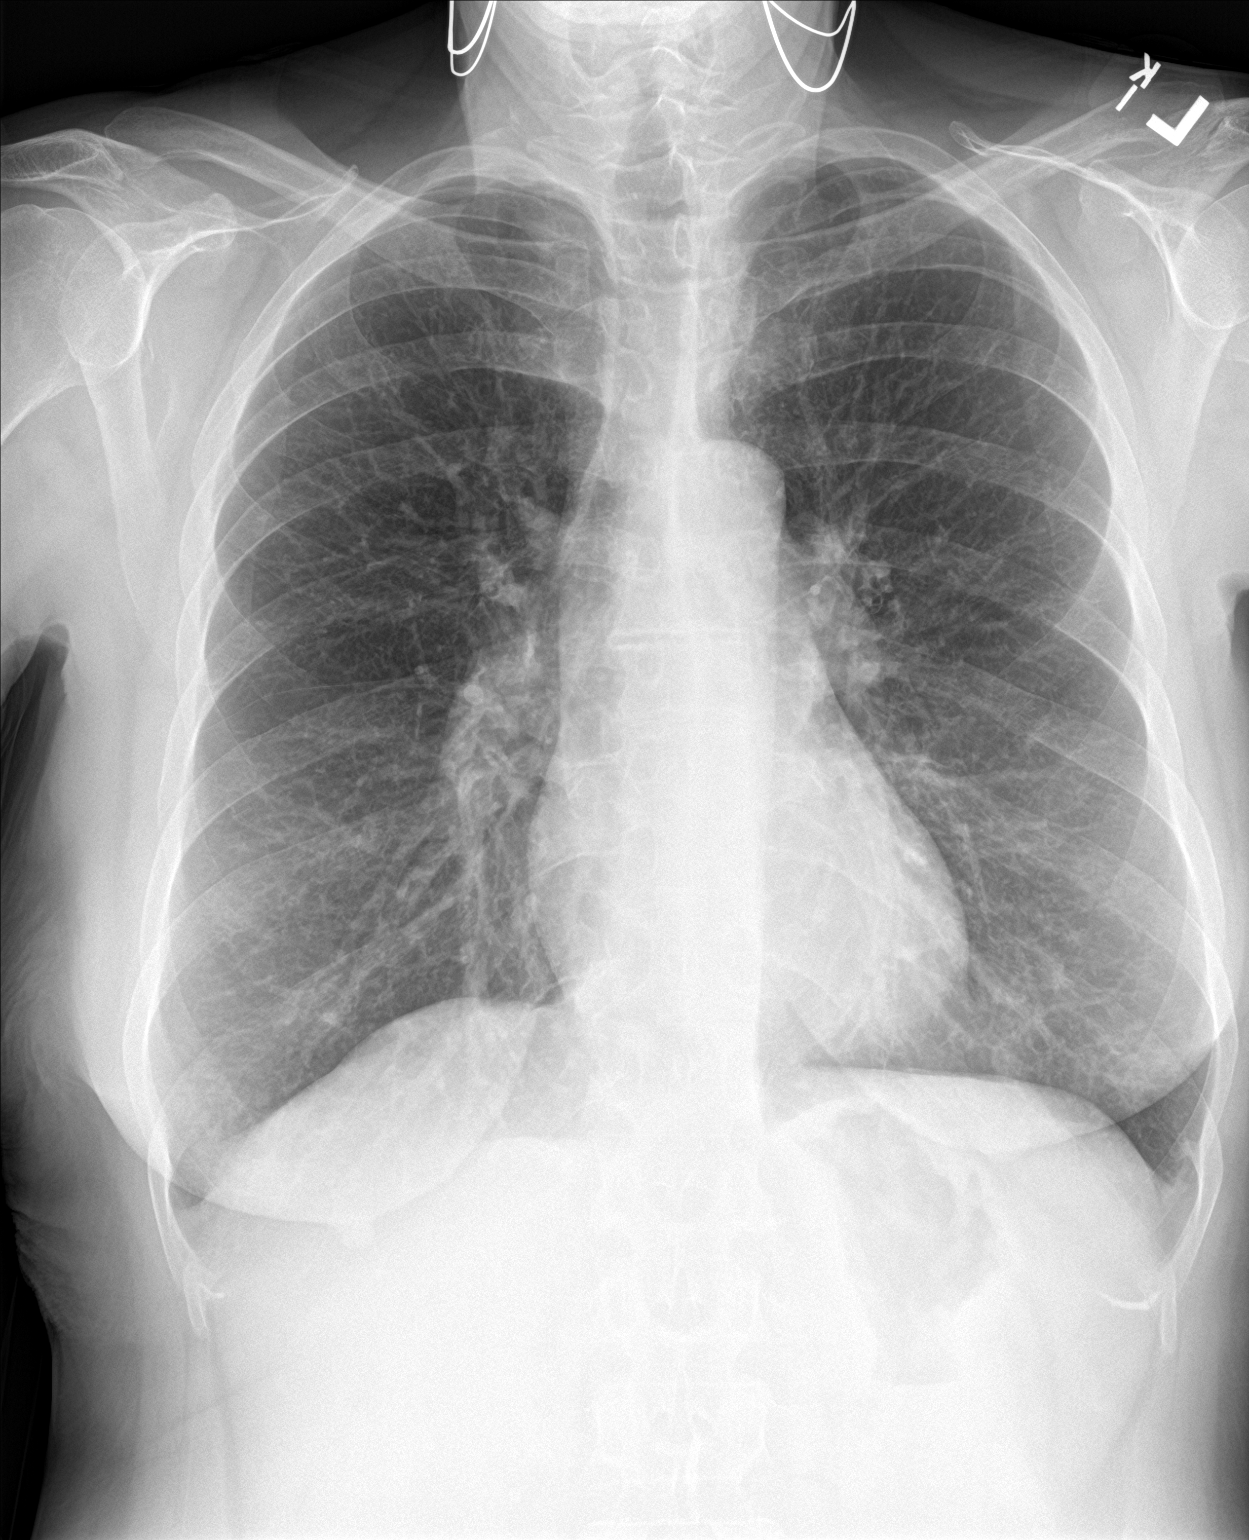

[chest lat]
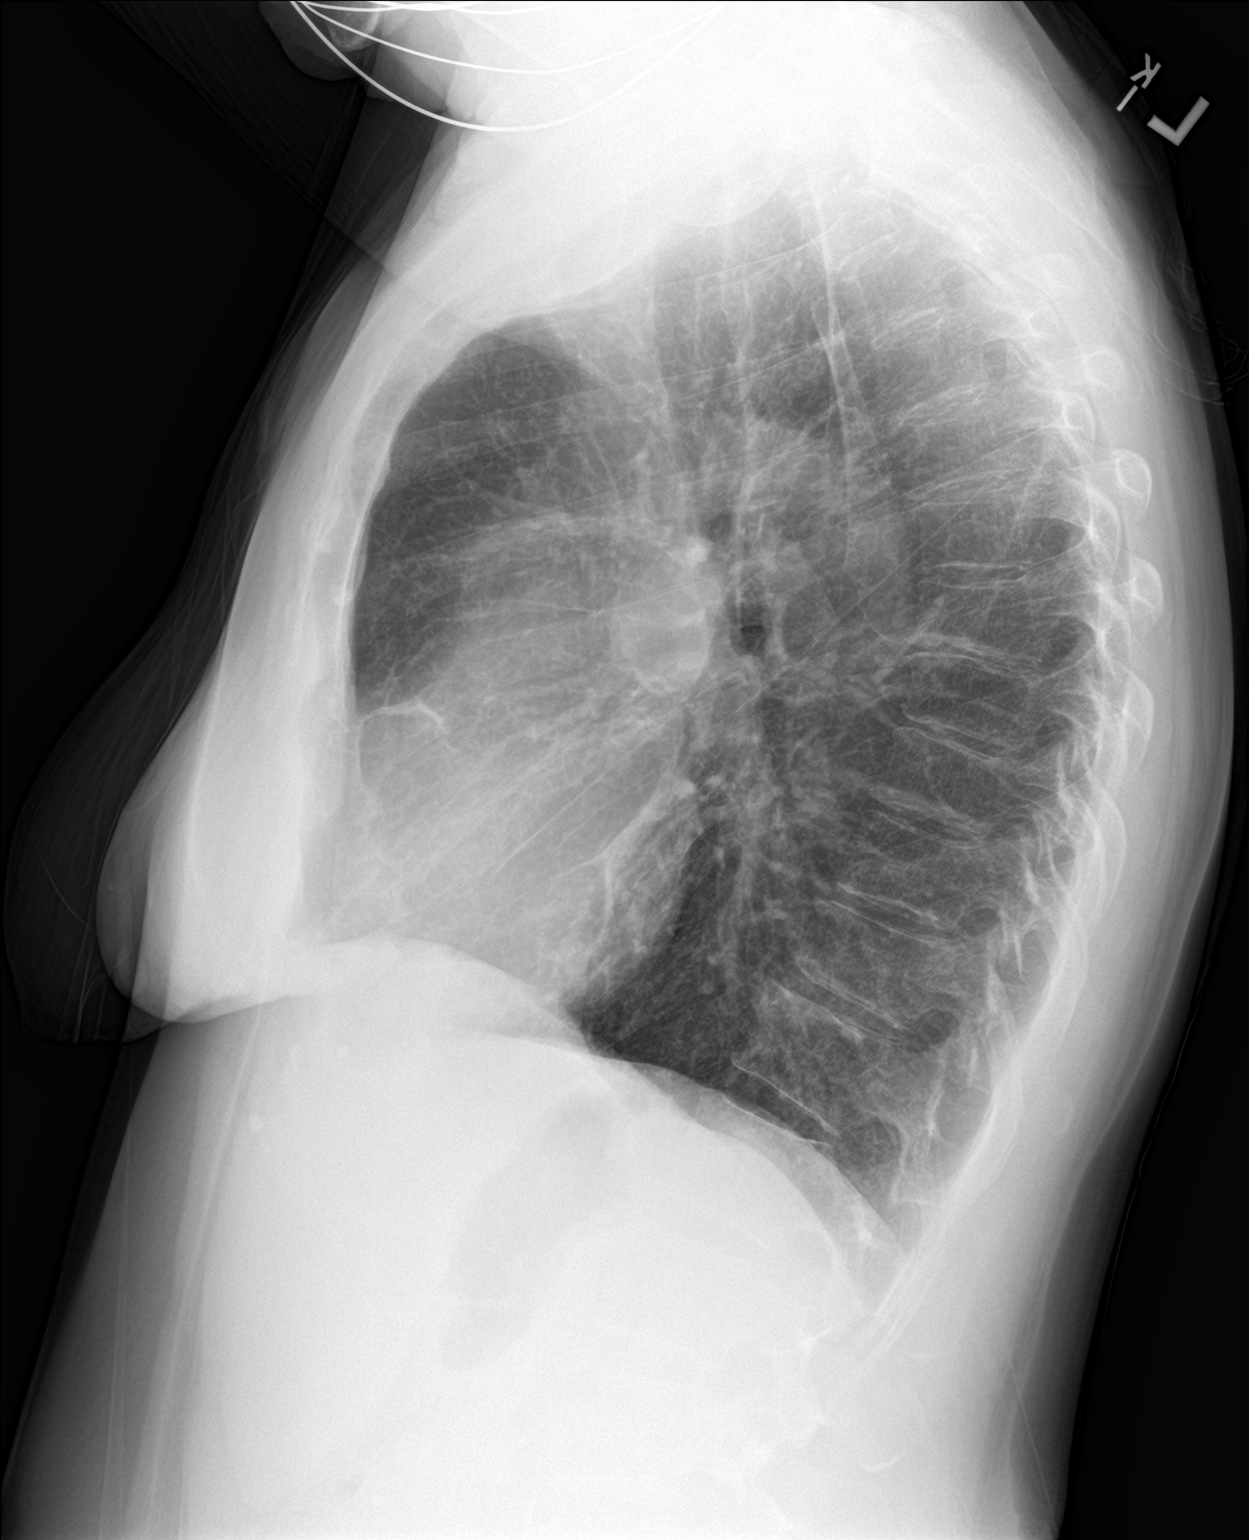

[2 of 2 positions shown; findings below may reference images not displayed]

FINDINGS: The heart size and mediastinal contours are within normal limits.
Both lungs are hyper expanded. The visualized skeletal structures
are unremarkable.
IMPRESSION: No active cardiopulmonary disease.

## 2018-10-24 ENCOUNTER — Other Ambulatory Visit: Payer: Self-pay | Admitting: Family Medicine

## 2018-10-26 ENCOUNTER — Ambulatory Visit (INDEPENDENT_AMBULATORY_CARE_PROVIDER_SITE_OTHER): Payer: Self-pay | Admitting: Family Medicine

## 2018-10-26 ENCOUNTER — Encounter: Payer: Self-pay | Admitting: Family Medicine

## 2018-10-26 ENCOUNTER — Other Ambulatory Visit: Payer: Self-pay

## 2018-10-26 VITALS — BP 124/78 | HR 87 | Temp 97.6°F | Ht 67.0 in | Wt 138.5 lb

## 2018-10-26 DIAGNOSIS — M545 Low back pain, unspecified: Secondary | ICD-10-CM

## 2018-10-26 DIAGNOSIS — F419 Anxiety disorder, unspecified: Secondary | ICD-10-CM

## 2018-10-26 DIAGNOSIS — G8929 Other chronic pain: Secondary | ICD-10-CM

## 2018-10-26 MED ORDER — OXYCODONE HCL 15 MG PO TABS: ORAL_TABLET | ORAL | 0 refills | Status: DC

## 2018-10-26 MED ORDER — OXYCODONE HCL 15 MG PO TABS: 15.0000 mg | ORAL_TABLET | Freq: Four times a day (QID) | ORAL | 0 refills | Status: DC | PRN

## 2018-10-26 MED ORDER — FLUOXETINE HCL 20 MG PO TABS: 20.0000 mg | ORAL_TABLET | Freq: Every day | ORAL | 3 refills | Status: DC

## 2018-10-26 NOTE — Progress Notes (Signed)
Subjective:     Patient ID: [name], female   DOB: December 07, 1960, 58 y.o.   MRN: [id number]  HPI Patient is seen for follow-up regarding her chronic back pain management and to discuss anxiety.  She had for years taken fairly high-dose alprazolam but we had discrepancy with drug screening in that she had negative alprazolam on her most recent drug screen.  There was no valid explanation.  We discontinued therefore her alprazolam back in the fall.  She states since that time she has had increased anxiety symptoms.  She has lots of stress.  Works about 60 hours/week and sometimes more.  She has had some family stressors.  She states some days anxiety seems worse than others.  She does drink some caffeine in the morning but is trying to scale back overall.  No alcohol use.  She states she took citalopram once in the past and is not sure but does not think that helped her anxiety.  Has chronic back pain.  She has not gotten relief with conservative measures.  She actually was on 4 times daily oxycodone when I inherited her pain control.  She is currently taking oxycodone 15 mg 3 times daily.  Indication for chronic opioid: chronic back pain Medication and dose: Oxycodone 15 mg po tid # pills per month: 90 Last UDS date: 10/19 Opioid Treatment Agreement signed (Y/N): yes Opioid Treatment Agreement last reviewed with patient:10/19   [facility] reviewed this encounter (include red flags)reviewed today with no concerns.:     Past Medical History:  Diagnosis Date  . ASTHMA UNSPECIFIED WITH EXACERBATION 07/21/2010  . CARPAL TUNNEL SYNDROME, BILATERAL 01/16/2008  . COPD 05/08/2008  . GERD 04/18/2007  . HELICOBACTER PYLORI GASTRITIS, HX OF 04/18/2007  . PANIC DISORDER 04/18/2007  . TOBACCO ABUSE 07/17/2009   Past Surgical History:  Procedure Laterality Date  . APPENDECTOMY    . OOPHORECTOMY      reports that she has been smoking cigarettes. She has a 50.00 pack-year smoking history. She has never used  smokeless tobacco. She reports that she does not drink alcohol or use drugs. family history includes Breast cancer in her paternal aunt; Cancer in her father; Clotting disorder in her brother; Diabetes in her mother; Heart disease in her father; Stomach cancer in her paternal uncle. Allergies  Allergen Reactions  . Aspirin     REACTION: had h. pylori due to too many goody powders. told not to take any more asa       Review of Systems  Musculoskeletal: Positive for back pain.  Psychiatric/Behavioral: Negative for agitation. The patient is nervous/anxious.        Objective:   Physical Exam Constitutional:      Appearance: Normal appearance.  Cardiovascular:     Rate and Rhythm: Normal rate and regular rhythm.  Pulmonary:     Effort: Pulmonary effort is normal.     Breath sounds: Normal breath sounds.  Neurological:     Mental Status: She is alert.        Assessment:     #1 chronic low back pain overall stable  #2 chronic anxiety symptoms.    Plan:     -Discussed nonpharmacologic management of anxiety with reduction in caffeine, regular exercise, etc. -We recommend consider trial of Prozac 20 mg once daily. -Refilled her oxycodone for 3 months and 24-month follow-up - she has set up follow up with psychiatry for February.  She is undecided if she will keep that.  Eulas Post  MD [facility] at [facility]

## 2018-11-06 ENCOUNTER — Other Ambulatory Visit: Payer: Self-pay

## 2018-11-06 ENCOUNTER — Encounter: Payer: Self-pay | Admitting: Family Medicine

## 2018-11-06 ENCOUNTER — Telehealth: Payer: Self-pay | Admitting: Family Medicine

## 2018-11-06 MED ORDER — CITALOPRAM HYDROBROMIDE 20 MG PO TABS: 20.0000 mg | ORAL_TABLET | Freq: Every day | ORAL | 3 refills | Status: DC

## 2018-11-06 NOTE — Telephone Encounter (Signed)
Message from Dr. Elease Hashimoto read to patient; verbalizes understanding.

## 2018-11-06 NOTE — Telephone Encounter (Signed)
Please advise 

## 2018-11-06 NOTE — Telephone Encounter (Signed)
Discontinue Prozac and start Celexa 20 mg po qd.  Would start with one half tab daily for 7 days and then increase to one full tablet.  #30 with 3 refills.

## 2018-11-06 NOTE — Telephone Encounter (Signed)
Called patient and LMOVM to return call  [name] for [facility] to Discuss results / PCP / recommendations / Schedule patient  Per Dr. Elease Hashimoto: Discontinue Prozac and start Celexa 20 mg po qd.  Would start with one half tab daily for 7 days and then increase to one full tablet.  #30 with 3 refills. Medication has been sent to the pharmacy.  CRM Created.

## 2018-11-06 NOTE — Telephone Encounter (Signed)
Copied from [place] [id number]. Topic: Quick Communication - Rx Refill/Question >> Nov 06, 2018  9:25 AM [name] wrote: Medication: Kaitlyn (PROZAC) 20 MG tablet - Pt states that this medication is making her stomach hurt and she has no appetite at all on this medication. Pt is wondering if a low Dose of [name] can be tried. Pt heard this was a good medication / please advise

## 2018-11-06 NOTE — Telephone Encounter (Signed)
This encounter was created in error - please disregard.

## 2018-11-19 ENCOUNTER — Ambulatory Visit (HOSPITAL_COMMUNITY): Payer: Self-pay | Admitting: Psychiatry

## 2018-11-28 ENCOUNTER — Other Ambulatory Visit: Payer: Self-pay | Admitting: Family Medicine

## 2018-12-17 ENCOUNTER — Telehealth: Payer: Self-pay | Admitting: Family Medicine

## 2018-12-17 NOTE — Telephone Encounter (Unsigned)
Copied from [place] [id number]. Topic: Quick Communication - Rx Refill/Question >> Dec 17, 2018  2:34 PM Percell Belt A wrote: Medication: oxyCODONE (ROXICODONE) 15 MG immediate release tablet [[id number]] -  pt is leaving for the beach on the 11[place] to go to the beach with sister.  She would like to know if this can be filled 2 days early so she can go out of town on the 11th?    Has the patient contacted their pharmacy? No   (Agent: If no, request that the patient contact the pharmacy for the refill.) (Agent: If yes, when and what did the pharmacy advise?)  Preferred Pharmacy (with phone number or street name): [id number] [place], [place] - [address] [phone] (Phone) [phone] (Fax)    Agent: Please be advised that RX refills may take up to 3 business days. We ask that you follow-up with your pharmacy.

## 2018-12-17 NOTE — Telephone Encounter (Signed)
OK.  I guess we will have to notify pharmacy.

## 2018-12-17 NOTE — Telephone Encounter (Signed)
Called pharmacy and spoke to [place] and she is going to refill on the 10th for the patient.

## 2018-12-17 NOTE — Telephone Encounter (Signed)
Please see message. °

## 2018-12-18 ENCOUNTER — Telehealth: Payer: Self-pay

## 2018-12-18 NOTE — Telephone Encounter (Signed)
[facility] and spoke [facility]n Pulse and she stated that the prescription is ready for pick up and she will hold it for patient as she stated that she is good to come when refill is due.

## 2018-12-18 NOTE — Telephone Encounter (Signed)
Copied from [place] [id number]. Topic: Quick Communication - Rx Refill/Question >> Dec 17, 2018  2:34 PM Percell Belt A wrote: Medication: oxyCODONE (ROXICODONE) 15 MG immediate release tablet [[id number]] -  pt is leaving for the beach on the 11th to go to the beach with sister.  She would like to know if this can be filled 2 days early so she can go out of town on the 11th?    Has the patient contacted their pharmacy? No   (Agent: If no, request that the patient contact the pharmacy for the refill.) (Agent: If yes, when and what did the pharmacy advise?)  Preferred Pharmacy (with phone number or street name): [id number] [place], [place] - [address] [phone] (Phone) [phone] (Fax)    Agent: Please be advised that RX refills may take up to 3 business days. We ask that you follow-up with your pharmacy. >> Dec 18, 2018  8:41 AM Carolyn Stare wrote:   Pt call to say she is not going to the beach so she does not need the med refilled early

## 2019-01-02 ENCOUNTER — Ambulatory Visit (HOSPITAL_COMMUNITY): Payer: Self-pay | Admitting: Psychiatry

## 2019-01-21 ENCOUNTER — Other Ambulatory Visit: Payer: Self-pay

## 2019-01-21 ENCOUNTER — Ambulatory Visit (INDEPENDENT_AMBULATORY_CARE_PROVIDER_SITE_OTHER): Payer: Self-pay | Admitting: Family Medicine

## 2019-01-21 ENCOUNTER — Encounter: Payer: Self-pay | Admitting: Family Medicine

## 2019-01-21 DIAGNOSIS — M545 Low back pain, unspecified: Secondary | ICD-10-CM

## 2019-01-21 DIAGNOSIS — G8929 Other chronic pain: Secondary | ICD-10-CM

## 2019-01-21 MED ORDER — OXYCODONE HCL 15 MG PO TABS: ORAL_TABLET | ORAL | 0 refills | Status: DC

## 2019-01-21 MED ORDER — OXYCODONE HCL 15 MG PO TABS: 15.0000 mg | ORAL_TABLET | Freq: Four times a day (QID) | ORAL | 0 refills | Status: DC | PRN

## 2019-01-21 NOTE — Progress Notes (Signed)
Patient ID: [name], female   DOB: 07/18/1961, 58 y.o.   MRN: [id number]  Virtual Visit via Video Note  I connected with [name] on 01/21/19 at  9:30 AM EDT by a video enabled telemedicine application and verified that I am speaking with the correct person using two identifiers.  Location patient: home Location provider:work or home office Persons participating in the virtual visit: patient, provider  I discussed the limitations of evaluation and management by telemedicine and the availability of in person appointments. The patient expressed understanding and agreed to proceed.   HPI: Patient has chronic back pain and has been on oxycodone for several years.  She called today for refills.  This was her 42-month pain management follow-up.  She states that her back pain has been slightly worse which she attributes to stress.  She states that every time she has had increased stress her pain has been ramped up.  She takes oxycodone 15 mg 3 times daily.  No constipation or other side effects.  Have been taking Celexa for depression but she feels this was not helping and she felt of anything this made her more fatigued.  She tapered herself off.  She states she has been smoking more than usual recently because of the stress.  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  At baseline though she is fairly limited with things like yard work because of her chronic back pain.  Denies any new symptoms such as radiculitis symptoms, lower extremity weakness, fever, chills, appetite, or weight changes  [place] controlled substance registry is assessed with no concerns.  She had pain management profile with labs back in October   ROS: See pertinent positives and negatives per HPI.  Past Medical History:  Diagnosis Date  . ASTHMA UNSPECIFIED WITH EXACERBATION 07/21/2010  . CARPAL TUNNEL SYNDROME, BILATERAL 01/16/2008  . COPD 05/08/2008  . GERD 04/18/2007   . HELICOBACTER PYLORI GASTRITIS, HX OF 04/18/2007  . PANIC DISORDER 04/18/2007  . TOBACCO ABUSE 07/17/2009    Past Surgical History:  Procedure Laterality Date  . APPENDECTOMY    . OOPHORECTOMY      Family History  Problem Relation Age of Onset  . Diabetes Mother   . Cancer Father        lung smoke   . Heart disease Father   . Clotting disorder Brother   . Breast cancer Paternal Aunt   . Stomach cancer Paternal Uncle   . Esophageal cancer Neg Hx   . Colon cancer Neg Hx     SOCIAL HX: still smoking   Current Outpatient Medications:  .  atorvastatin (LIPITOR) 40 MG tablet, Take 1 tablet (40 mg total) by mouth daily., Disp: 90 tablet, Rfl: 1 .  dicyclomine (BENTYL) 10 MG capsule, TAKE 1 CAPSULE (10 MG TOTAL) BY MOUTH 2 (TWO) TIMES DAILY AS NEEDED FOR SPASMS., Disp: 60 capsule, Rfl: 2 .  hyoscyamine (LEVSIN) 0.125 MG tablet, Take 1 tablet (0.125 mg total) by mouth daily., Disp: 30 tablet, Rfl: 2 .  omeprazole (PRILOSEC) 20 MG capsule, TAKE ONE CAPSULE BY MOUTH EVERY DAY, Disp: 90 capsule, Rfl: 1 .  oxyCODONE (ROXICODONE) 15 MG immediate release tablet, Take 1 tablet (15 mg total) by mouth every 6 (six) hours as needed for pain., Disp: 120 tablet, Rfl: 0 .  oxyCODONE (ROXICODONE) 15 MG immediate release tablet, Take one tablet every 6 hours as needed for pain.  May refill in two months,  Disp: 120 tablet, Rfl: 0 .  oxyCODONE (ROXICODONE) 15 MG immediate release tablet, Take 1 tablet (15 mg total) by mouth every 6 (six) hours as needed for pain., Disp: 120 tablet, Rfl: 0 .  SUMAtriptan (IMITREX) 50 MG tablet, TAKE 1 TABLET AS NEEDED FOR MIGRAINE MAY REPEAT IN 2 HRS IF HEADACHE PERSISTS, Disp: 10 tablet, Rfl: 0 .  SYMBICORT 80-4.5 MCG/ACT inhaler, TAKE 2 PUFFS BY MOUTH TWICE A DAY, Disp: 30.6 Inhaler, Rfl: 1 .  tiotropium (SPIRIVA HANDIHALER) 18 MCG inhalation capsule, Place 1 capsule (18 mcg total) into inhaler and inhale daily., Disp: 30 capsule, Rfl: 12 .  VENTOLIN HFA 108 (90 Base)  MCG/ACT inhaler, INHALE 2 PUFFS INTO LUNGS EVERY 4 HOURS AS NEEDED FOR WHEEZING OR SHORTNESS OF BREATH, Disp: 18 Inhaler, Rfl: 2  EXAM:  VITALS per patient if applicable:  GENERAL: alert, oriented, appears well and in no acute distress  HEENT: atraumatic, conjunttiva clear, no obvious abnormalities on inspection of external nose and ears  NECK: normal movements of the head and neck  LUNGS: on inspection no signs of respiratory distress, breathing rate appears normal, no obvious gross SOB, gasping or wheezing  CV: no obvious cyanosis  MS: moves all visible extremities without noticeable abnormality  PSYCH/NEURO: pleasant and cooperative, no obvious depression or anxiety, speech and thought processing grossly intact  ASSESSMENT AND PLAN:  Discussed the following assessment and plan:  Chronic back pain stable -Refill oxycodone for 3 months -Plan 45-month follow-up.     I discussed the assessment and treatment plan with the patient. The patient was provided an opportunity to ask questions and all were answered. The patient agreed with the plan and demonstrated an understanding of the instructions.   The patient was advised to call back or seek an in-person evaluation if the symptoms worsen or if the condition fails to improve as anticipated.  Carolann Littler, MD

## 2019-01-28 ENCOUNTER — Ambulatory Visit (INDEPENDENT_AMBULATORY_CARE_PROVIDER_SITE_OTHER): Payer: Self-pay | Admitting: Family Medicine

## 2019-01-28 ENCOUNTER — Telehealth: Payer: Self-pay | Admitting: Family Medicine

## 2019-01-28 ENCOUNTER — Other Ambulatory Visit: Payer: Self-pay

## 2019-01-28 DIAGNOSIS — F419 Anxiety disorder, unspecified: Secondary | ICD-10-CM

## 2019-01-28 NOTE — Telephone Encounter (Signed)
Copied from [place] [id number]. Topic: Quick Communication - See Telephone Encounter >> Jan 28, 2019 10:34 AM Blase Mess A wrote: CRM for notification. See Telephone encounter for: 01/28/19.  Patient is calling because she knows that Dr. Elease Hashimoto does not like her to take ALPRAZolam Duanne Moron) 1 MG tablet [[id number]]  DISCONTINUED . However her anxiety is through the roof. The patient is interested in valium. She has been on Valium before but years ago. Prescribed by a different doctor. Patient is having difficulty sleeping and eating.  And if he does not want to do valium what else can he prescribe.    Please advise. [phone] (M) [id number] [place], [place] - [address] [phone] (Phone) [phone] (Fax)

## 2019-01-28 NOTE — Telephone Encounter (Signed)
Patient has a Doxy appointment today at 3:45apm.

## 2019-01-28 NOTE — Progress Notes (Signed)
Patient ID: [name], female   DOB: [birth year], 58 y.o.   MRN: [id number]  Virtual Visit via Video Note  I connected with [name] on 01/28/19 at  3:45 PM EDT by a video enabled telemedicine application and verified that I am speaking with the correct person using two identifiers.  Location patient: home Location provider:work or home office Persons participating in the virtual visit: patient, provider  I discussed the limitations of evaluation and management by telemedicine and the availability of in person appointments. The patient expressed understanding and agreed to proceed.   HPI: Patient called to discuss anxiety symptoms.  She has longstanding history of anxiety going back many years.  She had been on alprazolam and was on very high dose of 2 mg 3 times daily when I first saw her several years ago.  We managed to help taper her down.  Our goal is to try to taper eventually off.  Last October she had drug screen because of her chronic opioid use and this came back negative for benzodiazepines in spite of her being on alprazolam 1 mg 3 times daily.  There was no valid explanation for the negative screen.  We discontinued benzodiazepines at that point.  She calls today stating she has had increased stressors.  She has a 73 year old daughter who is been at home because of the current pandemic.  She has another daughter who had been in drug rehab and recently relapsed and is now back in jail.  She has had what she describes as a couple of "panic attacks ".  She has had couple episodes where she had some dyspnea and palpitations.  She is tried multiple SSRIs in the past and had either fatigue or stomach upset or they apparently did not work.  She recalls trying sertraline, Prozac, Lexapro, and Celexa.  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  Past Medical History:  Diagnosis  Date  . ASTHMA UNSPECIFIED WITH EXACERBATION 07/21/2010  . CARPAL TUNNEL SYNDROME, BILATERAL 01/16/2008  . COPD 05/08/2008  . GERD 04/18/2007  . HELICOBACTER PYLORI GASTRITIS, HX OF 04/18/2007  . PANIC DISORDER 04/18/2007  . TOBACCO ABUSE 07/17/2009    Past Surgical History:  Procedure Laterality Date  . APPENDECTOMY    . OOPHORECTOMY      Family History  Problem Relation Age of Onset  . Diabetes Mother   . Cancer Father        lung smoke   . Heart disease Father   . Clotting disorder Brother   . Breast cancer Paternal Aunt   . Stomach cancer Paternal Uncle   . Esophageal cancer Neg Hx   . Colon cancer Neg Hx     SOCIAL HX: Smokes.  Denies regular alcohol use.   Current Outpatient Medications:  .  atorvastatin (LIPITOR) 40 MG tablet, Take 1 tablet (40 mg total) by mouth daily., Disp: 90 tablet, Rfl: 1 .  dicyclomine (BENTYL) 10 MG capsule, TAKE 1 CAPSULE (10 MG TOTAL) BY MOUTH 2 (TWO) TIMES DAILY AS NEEDED FOR SPASMS., Disp: 60 capsule, Rfl: 2 .  hyoscyamine (LEVSIN) 0.125 MG tablet, Take 1 tablet (0.125 mg total) by mouth daily., Disp: 30 tablet, Rfl: 2 .  omeprazole (PRILOSEC) 20 MG capsule, TAKE ONE CAPSULE BY MOUTH EVERY DAY, Disp: 90 capsule, Rfl: 1 .  oxyCODONE (ROXICODONE) 15 MG immediate release tablet, Take one tablet every 6 hours as  needed for pain.  May refill in two months, Disp: 120 tablet, Rfl: 0 .  oxyCODONE (ROXICODONE) 15 MG immediate release tablet, Take 1 tablet (15 mg total) by mouth every 6 (six) hours as needed for pain., Disp: 120 tablet, Rfl: 0 .  oxyCODONE (ROXICODONE) 15 MG immediate release tablet, Take 1 tablet (15 mg total) by mouth every 6 (six) hours as needed for pain., Disp: 120 tablet, Rfl: 0 .  SUMAtriptan (IMITREX) 50 MG tablet, TAKE 1 TABLET AS NEEDED FOR MIGRAINE MAY REPEAT IN 2 HRS IF HEADACHE PERSISTS, Disp: 10 tablet, Rfl: 0 .  SYMBICORT 80-4.5 MCG/ACT inhaler, TAKE 2 PUFFS BY MOUTH TWICE A DAY, Disp: 30.6 Inhaler, Rfl: 1 .  tiotropium  (SPIRIVA HANDIHALER) 18 MCG inhalation capsule, Place 1 capsule (18 mcg total) into inhaler and inhale daily., Disp: 30 capsule, Rfl: 12 .  VENTOLIN HFA 108 (90 Base) MCG/ACT inhaler, INHALE 2 PUFFS INTO LUNGS EVERY 4 HOURS AS NEEDED FOR WHEEZING OR SHORTNESS OF BREATH, Disp: 18 Inhaler, Rfl: 2  EXAM:  VITALS per patient if applicable:  GENERAL: alert, oriented, appears well and in no acute distress  HEENT: atraumatic, conjunttiva clear, no obvious abnormalities on inspection of external nose and ears  NECK: normal movements of the head and neck  LUNGS: on inspection no signs of respiratory distress, breathing rate appears normal, no obvious gross SOB, gasping or wheezing  CV: no obvious cyanosis  MS: moves all visible extremities without noticeable abnormality  PSYCH/NEURO: pleasant and cooperative, no obvious depression or anxiety, speech and thought processing grossly intact  ASSESSMENT AND PLAN:  Discussed the following assessment and plan:  Anxiety.  This sounds to be largely situational.  She is describing what may be occasional panic attacks.  -We have declined starting back benzodiazepines which she specifically is asking for today.  We have concerns because of safety especially with her chronic opioids and chronic lung issues -We discussed other options and she is reluctant to consider another trial of SSRI -We have offered counseling services with our behavioral health division and she is undecided at this time.     I discussed the assessment and treatment plan with the patient. The patient was provided an opportunity to ask questions and all were answered. The patient agreed with the plan and demonstrated an understanding of the instructions.   The patient was advised to call back or seek an in-person evaluation if the symptoms worsen or if the condition fails to improve as anticipated.   Carolann Littler, MD

## 2019-03-07 ENCOUNTER — Other Ambulatory Visit: Payer: Self-pay | Admitting: Family Medicine

## 2019-04-15 ENCOUNTER — Ambulatory Visit (INDEPENDENT_AMBULATORY_CARE_PROVIDER_SITE_OTHER): Payer: Self-pay | Admitting: Family Medicine

## 2019-04-15 ENCOUNTER — Other Ambulatory Visit: Payer: Self-pay

## 2019-04-15 DIAGNOSIS — J4489 Other specified chronic obstructive pulmonary disease: Secondary | ICD-10-CM

## 2019-04-15 DIAGNOSIS — J449 Chronic obstructive pulmonary disease, unspecified: Secondary | ICD-10-CM

## 2019-04-15 DIAGNOSIS — M79605 Pain in left leg: Secondary | ICD-10-CM

## 2019-04-15 DIAGNOSIS — E785 Hyperlipidemia, unspecified: Secondary | ICD-10-CM

## 2019-04-15 DIAGNOSIS — M545 Low back pain, unspecified: Secondary | ICD-10-CM

## 2019-04-15 DIAGNOSIS — G8929 Other chronic pain: Secondary | ICD-10-CM

## 2019-04-15 NOTE — Progress Notes (Signed)
Patient ID: [name], female   DOB: Jul 24, 1961, 58 y.o.   MRN: [id number]  This visit type was conducted due to national recommendations for restrictions regarding the COVID-19 pandemic in an effort to limit this patient's exposure and mitigate transmission in our community.   Virtual Visit via Video Note  I connected with [name] on 04/15/19 at 11:00 AM EDT by a video enabled telemedicine application and verified that I am speaking with the correct person using two identifiers.  Location patient: home Location provider:work or home office Persons participating in the virtual visit: patient, provider  I discussed the limitations of evaluation and management by telemedicine and the availability of in person appointments. The patient expressed understanding and agreed to proceed.   HPI: Patient has chronic nonsurgical back pain.  She has been on oxycodone for several years.  She still has chronic back pain but overall coping fairly well with that.  She does state that she fell over drop cord May 16.  No head injury.  No loss of consciousness.  She did not have any immediate leg pain but about 3 weeks ago developed some pain from the left knee down toward the ankle.  This again did not occur immediately after the fall.  She has achy to sharp pain with some associated numbness.  No weakness.  No urine or stool incontinence.  No edema.  Remains on oxycodone 15 mg 4 times daily.  Poor control of leg pain with this.  Hyperlipidemia treated with Lipitor.  She had labs done last October.  No myalgias.  Next pain management drug screen due by October  Past Medical History:  Diagnosis Date  . ASTHMA UNSPECIFIED WITH EXACERBATION 07/21/2010  . CARPAL TUNNEL SYNDROME, BILATERAL 01/16/2008  . COPD 05/08/2008  . GERD 04/18/2007  . HELICOBACTER PYLORI GASTRITIS, HX OF 04/18/2007  . PANIC DISORDER 04/18/2007  . TOBACCO ABUSE 07/17/2009   Past Surgical History:  Procedure Laterality Date  .  APPENDECTOMY    . OOPHORECTOMY      reports that she has been smoking cigarettes. She has a 50.00 pack-year smoking history. She has never used smokeless tobacco. She reports that she does not drink alcohol or use drugs. family history includes Breast cancer in her paternal aunt; Cancer in her father; Clotting disorder in her brother; Diabetes in her mother; Heart disease in her father; Stomach cancer in her paternal uncle. Allergies  Allergen Reactions  . Aspirin     REACTION: had h. pylori due to too many goody powders. told not to take any more asa      ROS: See pertinent positives and negatives per HPI.  Past Medical History:  Diagnosis Date  . ASTHMA UNSPECIFIED WITH EXACERBATION 07/21/2010  . CARPAL TUNNEL SYNDROME, BILATERAL 01/16/2008  . COPD 05/08/2008  . GERD 04/18/2007  . HELICOBACTER PYLORI GASTRITIS, HX OF 04/18/2007  . PANIC DISORDER 04/18/2007  . TOBACCO ABUSE 07/17/2009    Past Surgical History:  Procedure Laterality Date  . APPENDECTOMY    . OOPHORECTOMY      Family History  Problem Relation Age of Onset  . Diabetes Mother   . Cancer Father        lung smoke   . Heart disease Father   . Clotting disorder Brother   . Breast cancer Paternal Aunt   . Stomach cancer Paternal Uncle   . Esophageal cancer Neg Hx   . Colon cancer Neg Hx     SOCIAL HX: Ongoing nicotine use.  She is widowed.  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  atorvastatin (LIPITOR) 40 MG tablet, Take 1 tablet (40 mg total) by mouth daily., Disp: 90 tablet, Rfl: 1 .  dicyclomine (BENTYL) 10 MG capsule, TAKE 1 CAPSULE (10 MG TOTAL) BY MOUTH 2 (TWO) TIMES DAILY AS NEEDED FOR SPASMS., Disp: 60 capsule, Rfl: 2 .  hyoscyamine (LEVSIN) 0.125 MG tablet, Take 1 tablet (0.125 mg total) by mouth daily., Disp: 30 tablet, Rfl: 2 .  omeprazole (PRILOSEC) 20 MG capsule, TAKE ONE CAPSULE BY MOUTH EVERY DAY, Disp: 90 capsule, Rfl: 1 .  oxyCODONE (ROXICODONE) 15 MG  immediate release tablet, Take one tablet every 6 hours as needed for pain.  May refill in two months, Disp: 120 tablet, Rfl: 0 .  oxyCODONE (ROXICODONE) 15 MG immediate release tablet, Take 1 tablet (15 mg total) by mouth every 6 (six) hours as needed for pain., Disp: 120 tablet, Rfl: 0 .  oxyCODONE (ROXICODONE) 15 MG immediate release tablet, Take 1 tablet (15 mg total) by mouth every 6 (six) hours as needed for pain., Disp: 120 tablet, Rfl: 0 .  SUMAtriptan (IMITREX) 50 MG tablet, TAKE 1 TABLET AS NEEDED FOR MIGRAINE MAY REPEAT IN 2 HRS IF HEADACHE PERSISTS, Disp: 10 tablet, Rfl: 0 .  SYMBICORT 80-4.5 MCG/ACT inhaler, TAKE 2 PUFFS BY MOUTH TWICE A DAY, Disp: 30.6 Inhaler, Rfl: 1 .  tiotropium (SPIRIVA HANDIHALER) 18 MCG inhalation capsule, Place 1 capsule (18 mcg total) into inhaler and inhale daily., Disp: 30 capsule, Rfl: 12 .  VENTOLIN HFA 108 (90 Base) MCG/ACT inhaler, INHALE 2 PUFFS INTO LUNGS EVERY 4 HOURS AS NEEDED FOR WHEEZING OR SHORTNESS OF BREATH, Disp: 18 Inhaler, Rfl: 2  EXAM:  VITALS per patient if applicable:  GENERAL: alert, oriented, appears well and in no acute distress  HEENT: atraumatic, conjunttiva clear, no obvious abnormalities on inspection of external nose and ears  NECK: normal movements of the head and neck  LUNGS: on inspection no signs of respiratory distress, breathing rate appears normal, no obvious gross SOB, gasping or wheezing  CV: no obvious cyanosis  MS: moves all visible extremities without noticeable abnormality  PSYCH/NEURO: pleasant and cooperative, no obvious depression or anxiety, speech and thought processing grossly intact  ASSESSMENT AND PLAN:  Discussed the following assessment and plan:  #1 chronic low back pain-overall stable and unchanged.  Patient on chronic opioid therapy -She is due for opioid refill July 13  #2 left leg pain.  She had recent fall but this pain did not start until few weeks ago. -We will bring back to examine in  office and consider whether x-rays indicated -If this is sounding more neuropathic consider trial of gabapentin  #3 COPD -Continue her usual inhalers  #4 hyperlipidemia -Repeat lipids in about 3 months   I discussed the assessment and treatment plan with the patient. The patient was provided an opportunity to ask questions and all were answered. The patient agreed with the plan and demonstrated an understanding of the instructions.   The patient was advised to call back or seek an in-person evaluation if the symptoms worsen or if the condition fails to improve as anticipated.   Carolann Littler, MD

## 2019-04-16 ENCOUNTER — Encounter: Payer: Self-pay | Admitting: Family Medicine

## 2019-04-16 ENCOUNTER — Ambulatory Visit (INDEPENDENT_AMBULATORY_CARE_PROVIDER_SITE_OTHER): Payer: Self-pay | Admitting: Family Medicine

## 2019-04-16 ENCOUNTER — Telehealth: Payer: Self-pay | Admitting: Family Medicine

## 2019-04-16 ENCOUNTER — Other Ambulatory Visit: Payer: Self-pay

## 2019-04-16 VITALS — BP 122/76 | HR 108 | Temp 98.2°F | Ht 67.0 in | Wt 139.1 lb

## 2019-04-16 DIAGNOSIS — M545 Low back pain, unspecified: Secondary | ICD-10-CM

## 2019-04-16 DIAGNOSIS — M5416 Radiculopathy, lumbar region: Secondary | ICD-10-CM

## 2019-04-16 DIAGNOSIS — G8929 Other chronic pain: Secondary | ICD-10-CM

## 2019-04-16 MED ORDER — PREDNISONE 10 MG PO TABS: ORAL_TABLET | ORAL | 0 refills | Status: DC

## 2019-04-16 MED ORDER — OXYCODONE HCL 15 MG PO TABS: ORAL_TABLET | ORAL | 0 refills | Status: DC

## 2019-04-16 MED ORDER — OXYCODONE HCL 15 MG PO TABS: 15.0000 mg | ORAL_TABLET | Freq: Four times a day (QID) | ORAL | 0 refills | Status: DC | PRN

## 2019-04-16 NOTE — Telephone Encounter (Signed)
Patient says Dr. Elease Hashimoto forgot to send oxyCODONE (ROXICODONE) 15 MG immediate release tablet 3 months supply after her appointment yesterday. Would like this sent to  [id number] - [place], [place] - [address]  [address] [place] [address]  Phone: [phone] Fax: [phone]  Not a 24 hour pharmacy; exact hours not known

## 2019-04-16 NOTE — Patient Instructions (Signed)
Lumbosacral Radiculopathy Lumbosacral radiculopathy is a condition that involves the spinal nerves and nerve roots in the low back and bottom of the spine. The condition develops when these nerves and nerve roots move out of place or become inflamed and cause symptoms. What are the causes? This condition may be caused by:  Pressure from a disk that bulges out of place (herniated disk). A disk is a plate of soft cartilage that separates bones in the spine.  Disk changes that occur with age (disk degeneration).  A narrowing of the bones of the lower back (spinal stenosis).  A tumor.  An infection.  An injury that places sudden pressure on the disks that cushion the bones of your lower spine. What increases the risk? You are more likely to develop this condition if:  You are a female who is 30-50 years old.  You are a female who is 50-60 years old.  You use improper technique when lifting things.  You are overweight or live a sedentary lifestyle.  Your work requires frequent lifting.  You smoke.  You do repetitive activities that strain the spine. What are the signs or symptoms? Symptoms of this condition include:  Pain that goes down from your back into your legs (sciatica), usually on one side of the body. This is the most common symptom. The pain may be worse with sitting, coughing, or sneezing.  Pain and numbness in your legs.  Muscle weakness.  Tingling.  Loss of bladder control or bowel control. How is this diagnosed? This condition may be diagnosed based on:  Your symptoms and medical history.  A physical exam. If the pain is lasting, you may have tests, such as:  MRI scan.  X-ray.  CT scan.  A type of X-ray used to examine the spinal canal after injecting a dye into your spine (myelogram).  A test to measure how electrical impulses move through a nerve (nerve conduction study). How is this treated? Treatment may depend on the cause of the condition and  may include:  Working with a physical therapist.  Taking pain medicine.  Applying heat and ice to affected areas.  Doing stretches to improve flexibility.  Doing exercises to strengthen back muscles.  Having chiropractic spinal manipulation.  Using transcutaneous electrical nerve stimulation (TENS) therapy.  Getting a steroid injection in the spine. In some cases, no treatment is needed. If the condition is long-lasting (chronic), or if symptoms are severe, treatment may involve surgery or lifestyle changes, such as following a weight-loss plan. Follow these instructions at home: Activity  Avoid bending and other activities that make the problem worse.  Maintain a proper position when standing or sitting: ? When standing, keep your upper back and neck straight, with your shoulders pulled back. Avoid slouching. ? When sitting, keep your back straight and relax your shoulders. Do not round your shoulders or pull them backward.  Do not sit or stand in one place for long periods of time.  Take brief periods of rest throughout the day. This will reduce your pain. It is usually better to rest by lying down or standing, not sitting.  When you are resting for longer periods, mix in some mild activity or stretching between periods of rest. This will help to prevent stiffness and pain.  Get regular exercise. Ask your health care provider what activities are safe for you. If you were shown how to do any exercises or stretches, do them as directed by your health care provider.  Do   not lift anything that is heavier than 10 lb (4.5 kg) or the limit that you are told by your health care provider. Always use proper lifting technique, which includes: ? Bending your knees. ? Keeping the load close to your body. ? Avoiding twisting. Managing pain  If directed, put ice on the affected area: ? Put ice in a plastic bag. ? Place a towel between your skin and the bag. ? Leave the ice on for 20  minutes, 2-3 times a day.  If directed, apply heat to the affected area as often as told by your health care provider. Use the heat source that your health care provider recommends, such as a moist heat pack or a heating pad. ? Place a towel between your skin and the heat source. ? Leave the heat on for 20-30 minutes. ? Remove the heat if your skin turns bright red. This is especially important if you are unable to feel pain, heat, or cold. You may have a greater risk of getting burned.  Take over-the-counter and prescription medicines only as told by your health care provider. General instructions  Sleep on a firm mattress in a comfortable position. Try lying on your side with your knees slightly bent. If you lie on your back, put a pillow under your knees.  Do not drive or use heavy machinery while taking prescription pain medicine.  If your health care provider prescribed a diet or exercise program, follow it as directed.  Keep all follow-up visits as told by your health care provider. This is important. Contact a health care provider if:  Your pain does not improve over time, even when taking pain medicines. Get help right away if:  You develop severe pain.  Your pain suddenly gets worse.  You develop increasing weakness in your legs.  You lose the ability to control your bladder or bowel.  You have difficulty walking or balancing.  You have a fever. Summary  Lumbosacral radiculopathy is a condition that occurs when the spinal nerves and nerve roots in the lower part of the spine move out of place or become inflamed and cause symptoms.  Symptoms include pain, numbness, and tingling that go down from your back into your legs (sciatica), muscle weakness, and loss of bladder control or bowel control.  If directed, apply ice or heat to the affected area as told by your health care provider.  Follow instructions about activity, rest, and proper lifting technique. This  information is not intended to replace advice given to you by your health care provider. Make sure you discuss any questions you have with your health care provider. Document Released: 09/26/2005 Document Revised: 09/14/2017 Document Reviewed: 09/14/2017 Elsevier Patient Education  [place].

## 2019-04-16 NOTE — Progress Notes (Signed)
Subjective:     Patient ID: [name], female   DOB: [birth year], 58 y.o.   MRN: [id number]  HPI Patient has chronic back pain.  She has been on chronic oxycodone for several years.  She relates May 16 she was at the beach and tripped over drop cord and fell.  She thinks she landed on her left side.  There is no direct trauma to her back as far she knows that time.  She did not have any immediate back pain but about 3 weeks ago developed some pain with left lower lumbar radiating into the buttock and down occasionally as far as the ankle.  She has sensation of "pins-and-needles "left lower extremity intermittently.  No urine or stool incontinence.  Pain is moderate to severe at times.  Pain is worse with changing positions.  She thinks she may have had some weakness intermittently but ambulating without difficulty.  Past Medical History:  Diagnosis Date  . ASTHMA UNSPECIFIED WITH EXACERBATION 07/21/2010  . CARPAL TUNNEL SYNDROME, BILATERAL 01/16/2008  . COPD 05/08/2008  . GERD 04/18/2007  . HELICOBACTER PYLORI GASTRITIS, HX OF 04/18/2007  . PANIC DISORDER 04/18/2007  . TOBACCO ABUSE 07/17/2009   Past Surgical History:  Procedure Laterality Date  . APPENDECTOMY    . OOPHORECTOMY      reports that she has been smoking cigarettes. She has a 50.00 pack-year smoking history. She has never used smokeless tobacco. She reports that she does not drink alcohol or use drugs. family history includes Breast cancer in her paternal aunt; Cancer in her father; Clotting disorder in her brother; Diabetes in her mother; Heart disease in her father; Stomach cancer in her paternal uncle. Allergies  Allergen Reactions  . Aspirin     REACTION: had h. pylori due to too many goody powders. told not to take any more asa     Review of Systems  Constitutional: Negative for chills and fever.  Cardiovascular: Negative for chest pain.  Musculoskeletal: Positive for back pain.  Neurological: Positive for weakness and  numbness. Negative for dizziness.       Objective:   Physical Exam Constitutional:      Appearance: Normal appearance.  Cardiovascular:     Rate and Rhythm: Normal rate and regular rhythm.  Pulmonary:     Effort: Pulmonary effort is normal.     Breath sounds: Normal breath sounds.  Musculoskeletal:     Right lower leg: No edema.     Left lower leg: No edema.     Comments: No spinal tenderness.  Straight leg raise are negative bilaterally.  Neurological:     Mental Status: She is alert.     Comments: She does have diminished left knee reflex compared to the right.  Right is 2+ and left is trace.  She has 1+ ankle reflexes bilaterally.  She has question of some weakness with knee extension on the left compared to the right and also mild weakness with left dorsiflexion and plantarflexion compared to the right        Assessment:     Patient presents with acute on chronic back pain.  Her acute pain is associated with some radiculitis type symptoms on the left.  She does have objective finding of decreased left knee reflex    Plan:     -We recommended trial of prednisone taper and reviewed potential side effects -Continue her usual oxycodone which is 15 mg 4 times daily.  She was given 31-month refills. -Continue walking as  tolerated -Touch base if symptoms not improving over the next week.  May need MRI to further clarify if pain not improving  Eulas Post MD [facility]

## 2019-04-26 ENCOUNTER — Telehealth: Payer: Self-pay

## 2019-04-26 NOTE — Telephone Encounter (Signed)
Copied from [place] [id number]. Topic: General - Other >> Apr 25, 2019  3:04 PM Leward Quan A wrote: Reason for CRM: Patient called to inform Dr Elease Hashimoto that she had to stop taking the Prednisone because it made her so very nervous she was not able to sleep. Patient states that her left leg was still hurting say the pain is in her back & hip and down but say that from her knee down is numb. Patient is asking for a call back today please Ph# [phone]

## 2019-04-26 NOTE — Telephone Encounter (Signed)
Please see message. °

## 2019-04-27 NOTE — Telephone Encounter (Signed)
Set up Doxy follow up   We might need to get follow up MRI to further clarify.

## 2019-04-29 NOTE — Telephone Encounter (Signed)
Called patient and she has a Doxy set up on Tuesday, 04/30/19

## 2019-04-30 ENCOUNTER — Ambulatory Visit (INDEPENDENT_AMBULATORY_CARE_PROVIDER_SITE_OTHER): Payer: Self-pay | Admitting: Family Medicine

## 2019-04-30 ENCOUNTER — Other Ambulatory Visit: Payer: Self-pay

## 2019-04-30 DIAGNOSIS — G8929 Other chronic pain: Secondary | ICD-10-CM

## 2019-04-30 DIAGNOSIS — M5416 Radiculopathy, lumbar region: Secondary | ICD-10-CM

## 2019-04-30 DIAGNOSIS — M545 Low back pain: Secondary | ICD-10-CM

## 2019-04-30 NOTE — Progress Notes (Signed)
Patient ID: [name], female   DOB: 10/04/1961, 58 y.o.   MRN: [id number]   This visit type was conducted due to national recommendations for restrictions regarding the COVID-19 pandemic in an effort to limit this patient's exposure and mitigate transmission in our community.   Virtual Visit via Video Note  I connected with [name] on 04/30/19 at 11:30 AM EDT by a video enabled telemedicine application and verified that I am speaking with the correct person using two identifiers.  Location patient: home Location provider:work or home office Persons participating in the virtual visit: patient, provider  I discussed the limitations of evaluation and management by telemedicine and the availability of in person appointments. The patient expressed understanding and agreed to proceed.   HPI: Patient was seen recently for back pain with radiation down left lower extremity.  Refer to previous note.  She had fallen at the beach May 16 after tripping over a cord and initially stated that was about 3 weeks later when she noticed some pain and "pins-and-needles" sensation knee to ankle.  However, she now thinks this was only a few days before she had onset of symptoms above.  She has pain which she states is very severe- 9-10 out of 10 intensity that radiates from her left lower lumbar area into the buttock and all the way to the foot.  She still has some numbness knee to ankle.  She is complaining of some weakness.  She did have decreased knee reflex when seen here in the office on 7/7.  We had prescribed oral prednisone but she had insomnia and anxiousness and she never completed that course.  She does have background of chronic back pain for years and has been on chronic oxycodone for many years.  She has tried conservative things with heat, ice, topical sports creams, and Advil without relief.  No urine or stool incontinence She states she is having progressive difficulties ambulating because  of the pain and weakness   ROS: See pertinent positives and negatives per HPI.  Past Medical History:  Diagnosis Date  . ASTHMA UNSPECIFIED WITH EXACERBATION 07/21/2010  . CARPAL TUNNEL SYNDROME, BILATERAL 01/16/2008  . COPD 05/08/2008  . GERD 04/18/2007  . HELICOBACTER PYLORI GASTRITIS, HX OF 04/18/2007  . PANIC DISORDER 04/18/2007  . TOBACCO ABUSE 07/17/2009    Past Surgical History:  Procedure Laterality Date  . APPENDECTOMY    . OOPHORECTOMY      Family History  Problem Relation Age of Onset  . Diabetes Mother   . Cancer Father        lung smoke   . Heart disease Father   . Clotting disorder Brother   . Breast cancer Paternal Aunt   . Stomach cancer Paternal Uncle   . Esophageal cancer Neg Hx   . Colon cancer Neg Hx     SOCIAL HX: Long history of ongoing nicotine use.  She is widowed.   Current Outpatient Medications:  .  atorvastatin (LIPITOR) 40 MG tablet, Take 1 tablet (40 mg total) by mouth daily., Disp: 90 tablet, Rfl: 1 .  dicyclomine (BENTYL) 10 MG capsule, TAKE 1 CAPSULE (10 MG TOTAL) BY MOUTH 2 (TWO) TIMES DAILY AS NEEDED FOR SPASMS., Disp: 60 capsule, Rfl: 2 .  hyoscyamine (LEVSIN) 0.125 MG tablet, Take 1 tablet (0.125 mg total) by mouth daily., Disp: 30 tablet, Rfl: 2 .  omeprazole (PRILOSEC) 20 MG capsule, TAKE ONE CAPSULE BY MOUTH EVERY DAY, Disp: 90 capsule, Rfl: 1 .  oxyCODONE (  ROXICODONE) 15 MG immediate release tablet, Take one tablet every 6 hours as needed for pain.  May refill in two months, Disp: 120 tablet, Rfl: 0 .  oxyCODONE (ROXICODONE) 15 MG immediate release tablet, Take 1 tablet (15 mg total) by mouth every 6 (six) hours as needed for pain., Disp: 120 tablet, Rfl: 0 .  oxyCODONE (ROXICODONE) 15 MG immediate release tablet, Take 1 tablet (15 mg total) by mouth every 6 (six) hours as needed for pain., Disp: 120 tablet, Rfl: 0 .  predniSONE (DELTASONE) 10 MG tablet, Taper as follows: 4-4-4-3-3-2-2-1-1, Disp: 24 tablet, Rfl: 0 .  SUMAtriptan (IMITREX)  50 MG tablet, TAKE 1 TABLET AS NEEDED FOR MIGRAINE MAY REPEAT IN 2 HRS IF HEADACHE PERSISTS, Disp: 10 tablet, Rfl: 0 .  SYMBICORT 80-4.5 MCG/ACT inhaler, TAKE 2 PUFFS BY MOUTH TWICE A DAY, Disp: 30.6 Inhaler, Rfl: 1 .  tiotropium (SPIRIVA HANDIHALER) 18 MCG inhalation capsule, Place 1 capsule (18 mcg total) into inhaler and inhale daily., Disp: 30 capsule, Rfl: 12 .  VENTOLIN HFA 108 (90 Base) MCG/ACT inhaler, INHALE 2 PUFFS INTO LUNGS EVERY 4 HOURS AS NEEDED FOR WHEEZING OR SHORTNESS OF BREATH, Disp: 18 Inhaler, Rfl: 2  EXAM:  VITALS per patient if applicable:  GENERAL: alert, oriented, appears well and in no acute distress  HEENT: atraumatic, conjunttiva clear, no obvious abnormalities on inspection of external nose and ears  NECK: normal movements of the head and neck  LUNGS: on inspection no signs of respiratory distress, breathing rate appears normal, no obvious gross SOB, gasping or wheezing  CV: no obvious cyanosis  MS: moves all visible extremities without noticeable abnormality  PSYCH/NEURO: pleasant and cooperative, no obvious depression or anxiety, speech and thought processing grossly intact  ASSESSMENT AND PLAN:  Discussed the following assessment and plan:  Left lumbar radiculitis-worsening of symptoms over the past several weeks according to patient with complaints of numbness and progressive weakness- and progressive pain.  -Patient is specifically requesting Depo-Medrol which she has tolerated better than oral prednisone.  We will offer Depo-Medrol 80 mg IM -Set up MRI to further assess given decreased knee reflexes, complaints of progressive weakness, numbness, and progressive pain not improved with conservative therapies   I discussed the assessment and treatment plan with the patient. The patient was provided an opportunity to ask questions and all were answered. The patient agreed with the plan and demonstrated an understanding of the instructions.   The patient  was advised to call back or seek an in-person evaluation if the symptoms worsen or if the condition fails to improve as anticipated.   Carolann Littler, MD

## 2019-05-01 ENCOUNTER — Ambulatory Visit (INDEPENDENT_AMBULATORY_CARE_PROVIDER_SITE_OTHER): Payer: Self-pay | Admitting: Family Medicine

## 2019-05-01 ENCOUNTER — Other Ambulatory Visit: Payer: Self-pay

## 2019-05-01 DIAGNOSIS — M5416 Radiculopathy, lumbar region: Secondary | ICD-10-CM

## 2019-05-01 MED ORDER — METHYLPREDNISOLONE ACETATE 80 MG/ML IJ SUSP
80.0000 mg | Freq: Once | INTRAMUSCULAR | Status: AC
  Administered 2019-05-01: 80 mg via INTRAMUSCULAR

## 2019-05-01 NOTE — Progress Notes (Signed)
Agree with injection of depo- medrol 80 mg as given.  We had recommended this from office visit yesterday.  Eulas Post MD [facility]

## 2019-05-14 ENCOUNTER — Other Ambulatory Visit: Payer: Self-pay

## 2019-05-14 ENCOUNTER — Telehealth: Payer: Self-pay | Admitting: Family Medicine

## 2019-05-14 MED ORDER — BUSPIRONE HCL 7.5 MG PO TABS: 7.5000 mg | ORAL_TABLET | Freq: Two times a day (BID) | ORAL | 0 refills | Status: DC

## 2019-05-14 NOTE — Telephone Encounter (Signed)
Pt stated she need something for her nerves. Please call pt to advise

## 2019-05-14 NOTE — Telephone Encounter (Signed)
Called patient and let her know that I have already sent in this medication and made her a Doxy follow up on 06/14/19 for this medication. Patient verbalized an understanding.

## 2019-05-14 NOTE — Telephone Encounter (Signed)
Pt was seen 01/28/19 for anxiety.

## 2019-05-14 NOTE — Telephone Encounter (Signed)
Please see message. °

## 2019-05-14 NOTE — Telephone Encounter (Signed)
May send in Buspar 7.5 mg po bid .   We may have to increase the dose but would start there.  Send in one month supply and make sure one month follow up.

## 2019-05-14 NOTE — Telephone Encounter (Signed)
Refer to note from 4-20;   We cannot use benzos.   She refused SSRIs.  We suggested counseling and she said she would consider.   Other thought is to see if she has tried Buspar in past.

## 2019-05-14 NOTE — Telephone Encounter (Signed)
Called patient and gave her message. Patient verified that she would like to try the Buspar and she thinks she tried in the past and it helped and she is not sure why she did not keep taking. Patient used [facility] in [place].

## 2019-05-15 ENCOUNTER — Other Ambulatory Visit: Payer: Self-pay | Admitting: Family Medicine

## 2019-05-17 ENCOUNTER — Other Ambulatory Visit: Payer: Self-pay | Admitting: Family Medicine

## 2019-05-17 ENCOUNTER — Ambulatory Visit
Admission: RE | Admit: 2019-05-17 | Discharge: 2019-05-17 | Disposition: A | Discharge status: Home / Self Care | Payer: BC Managed Care – PPO | Source: Ambulatory Visit | Attending: Family Medicine | Admitting: Family Medicine

## 2019-05-17 ENCOUNTER — Other Ambulatory Visit: Payer: Self-pay

## 2019-05-17 DIAGNOSIS — M48061 Spinal stenosis, lumbar region without neurogenic claudication: Secondary | ICD-10-CM | POA: Diagnosis not present

## 2019-05-17 DIAGNOSIS — M5416 Radiculopathy, lumbar region: Secondary | ICD-10-CM

## 2019-05-22 ENCOUNTER — Other Ambulatory Visit: Payer: Self-pay

## 2019-05-22 DIAGNOSIS — G8929 Other chronic pain: Secondary | ICD-10-CM

## 2019-05-22 DIAGNOSIS — M5416 Radiculopathy, lumbar region: Secondary | ICD-10-CM

## 2019-05-29 ENCOUNTER — Other Ambulatory Visit: Payer: Self-pay | Admitting: Family Medicine

## 2019-06-05 ENCOUNTER — Other Ambulatory Visit: Payer: Self-pay | Admitting: Family Medicine

## 2019-06-09 ENCOUNTER — Observation Stay (HOSPITAL_COMMUNITY)
Admission: EM | Admit: 2019-06-09 | Discharge: 2019-06-10 | Disposition: A | Discharge status: Home / Self Care | Payer: BC Managed Care – PPO | Attending: Emergency Medicine | Admitting: Emergency Medicine

## 2019-06-09 ENCOUNTER — Other Ambulatory Visit: Payer: Self-pay

## 2019-06-09 ENCOUNTER — Encounter (HOSPITAL_COMMUNITY): Payer: Self-pay | Admitting: Emergency Medicine

## 2019-06-09 DIAGNOSIS — R0689 Other abnormalities of breathing: Secondary | ICD-10-CM | POA: Diagnosis not present

## 2019-06-09 DIAGNOSIS — J45909 Unspecified asthma, uncomplicated: Secondary | ICD-10-CM | POA: Insufficient documentation

## 2019-06-09 DIAGNOSIS — Z20828 Contact with and (suspected) exposure to other viral communicable diseases: Secondary | ICD-10-CM | POA: Insufficient documentation

## 2019-06-09 DIAGNOSIS — R55 Syncope and collapse: Principal | ICD-10-CM | POA: Diagnosis present

## 2019-06-09 DIAGNOSIS — F17210 Nicotine dependence, cigarettes, uncomplicated: Secondary | ICD-10-CM | POA: Insufficient documentation

## 2019-06-09 DIAGNOSIS — J4489 Other specified chronic obstructive pulmonary disease: Secondary | ICD-10-CM | POA: Diagnosis present

## 2019-06-09 DIAGNOSIS — K219 Gastro-esophageal reflux disease without esophagitis: Secondary | ICD-10-CM | POA: Diagnosis present

## 2019-06-09 DIAGNOSIS — R0789 Other chest pain: Secondary | ICD-10-CM | POA: Diagnosis not present

## 2019-06-09 DIAGNOSIS — E876 Hypokalemia: Secondary | ICD-10-CM | POA: Diagnosis present

## 2019-06-09 DIAGNOSIS — J449 Chronic obstructive pulmonary disease, unspecified: Secondary | ICD-10-CM | POA: Diagnosis not present

## 2019-06-09 DIAGNOSIS — R4020 Unspecified coma: Secondary | ICD-10-CM | POA: Diagnosis not present

## 2019-06-09 DIAGNOSIS — R531 Weakness: Secondary | ICD-10-CM | POA: Diagnosis not present

## 2019-06-09 DIAGNOSIS — G8929 Other chronic pain: Secondary | ICD-10-CM

## 2019-06-09 DIAGNOSIS — T50904A Poisoning by unspecified drugs, medicaments and biological substances, undetermined, initial encounter: Secondary | ICD-10-CM | POA: Diagnosis not present

## 2019-06-09 DIAGNOSIS — F17200 Nicotine dependence, unspecified, uncomplicated: Secondary | ICD-10-CM | POA: Diagnosis present

## 2019-06-09 DIAGNOSIS — R945 Abnormal results of liver function studies: Secondary | ICD-10-CM

## 2019-06-09 DIAGNOSIS — M545 Low back pain, unspecified: Secondary | ICD-10-CM

## 2019-06-09 DIAGNOSIS — R404 Transient alteration of awareness: Secondary | ICD-10-CM | POA: Diagnosis not present

## 2019-06-09 LAB — CBC WITH DIFFERENTIAL/PLATELET
Abs Immature Granulocytes: 0.1 10*3/uL — ABNORMAL HIGH (ref 0.00–0.07)
Basophils Absolute: 0 10*3/uL (ref 0.0–0.1)
Basophils Relative: 0 %
Eosinophils Absolute: 0.1 10*3/uL (ref 0.0–0.5)
Eosinophils Relative: 1 %
HCT: 42 % (ref 36.0–46.0)
Hemoglobin: 14.2 g/dL (ref 12.0–15.0)
Immature Granulocytes: 1 %
Lymphocytes Relative: 18 %
Lymphs Abs: 1.5 10*3/uL (ref 0.7–4.0)
MCH: 32.7 pg (ref 26.0–34.0)
MCHC: 33.8 g/dL (ref 30.0–36.0)
MCV: 96.8 fL (ref 80.0–100.0)
Monocytes Absolute: 0.5 10*3/uL (ref 0.1–1.0)
Monocytes Relative: 6 %
Neutro Abs: 6.1 10*3/uL (ref 1.7–7.7)
Neutrophils Relative %: 74 %
Platelets: 202 10*3/uL (ref 150–400)
RBC: 4.34 MIL/uL (ref 3.87–5.11)
RDW: 13.2 % (ref 11.5–15.5)
WBC: 8.2 10*3/uL (ref 4.0–10.5)
nRBC: 0 % (ref 0.0–0.2)

## 2019-06-09 LAB — COMPREHENSIVE METABOLIC PANEL
ALT: 61 U/L — ABNORMAL HIGH (ref 0–44)
AST: 86 U/L — ABNORMAL HIGH (ref 15–41)
Albumin: 3.5 g/dL (ref 3.5–5.0)
Alkaline Phosphatase: 68 U/L (ref 38–126)
Anion gap: 15 (ref 5–15)
BUN: 9 mg/dL (ref 6–20)
CO2: 24 mmol/L (ref 22–32)
Calcium: 8.8 mg/dL — ABNORMAL LOW (ref 8.9–10.3)
Chloride: 94 mmol/L — ABNORMAL LOW (ref 98–111)
Creatinine, Ser: 0.75 mg/dL (ref 0.44–1.00)
GFR calc Af Amer: >60 mL/min (ref >60)
GFR calc non Af Amer: >60 mL/min (ref >60)
Glucose, Bld: 107 mg/dL — ABNORMAL HIGH (ref 70–99)
Potassium: 3.2 mmol/L — ABNORMAL LOW (ref 3.5–5.1)
Sodium: 133 mmol/L — ABNORMAL LOW (ref 135–145)
Total Bilirubin: 0.5 mg/dL (ref 0.3–1.2)
Total Protein: 6.9 g/dL (ref 6.5–8.1)

## 2019-06-09 LAB — ETHANOL: Alcohol, Ethyl (B): 87 mg/dL — ABNORMAL HIGH (ref <10)

## 2019-06-09 NOTE — ED Triage Notes (Signed)
Pt BIB EMS with suspected OD. Per family pt was found unresponsive with a pulse. Family began CPR. Upon arrival, EMS states pt was alert and oriented. Pt reports she was outside doing yard work and hadn't eaten all day or yesterday and not had much to drink today. Pt endorses taking her normal opioids as prescribed. Pt VS WNL.

## 2019-06-09 NOTE — ED Provider Notes (Signed)
[facility]PART[facility] Provider Note   CSN: [id number] Arrival date & time: 06/09/19  2215     History   Chief Complaint Chief Complaint  Patient presents with  . Drug Overdose    HPI [name] is a 58 y.o. female.     Patient to ED after reportedly passing out while talking to her daughter, who states the patient lost pulses and became cyanotic. She initiated CPR and called EMS. Patient was reportedly still unconscious on their arrival and CPR was continued. The patient states she has not been eating or drinking lately, had a "couple of beers" today and was trying to get the lawn mowed this evening and "just couldn't do it", referencing being generally weak and tired. She went into the house and remembers telling her daughter she didn't feel well, "and that's all I remember". No current nausea, no vomiting, no SOB. Her chest hurts from attempted CPR but she denies having chest pain prior to syncope. She is on chronic 15 mg oxycodone but denies overdose, intention of self harm, SI/HI.   The history is provided by the patient. No language interpreter was used.  Drug Overdose Associated symptoms include chest pain (See HPI.). Pertinent negatives include no abdominal pain and no shortness of breath.    Past Medical History:  Diagnosis Date  . ASTHMA UNSPECIFIED WITH EXACERBATION 07/21/2010  . CARPAL TUNNEL SYNDROME, BILATERAL 01/16/2008  . COPD 05/08/2008  . GERD 04/18/2007  . HELICOBACTER PYLORI GASTRITIS, HX OF 04/18/2007  . PANIC DISORDER 04/18/2007  . TOBACCO ABUSE 07/17/2009    Patient Active Problem List   Diagnosis Date Noted  . 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  . Depression, major, single episode, severe ([place]) 11/15/2017  . Anxiety state 01/02/2017  . Maxillary sinusitis, acute 12/30/2015  . Migraine 12/30/2015  . Special screening for malignant neoplasms, colon 04/14/2014  . Chronic low back pain 05/23/2013  . Dyslipidemia 11/14/2011  .  Hyperglycemia 11/14/2011  . COPD with acute exacerbation ([place]) 08/26/2011  . Low back pain 12/22/2010  . ASTHMA UNSPECIFIED WITH EXACERBATION 07/21/2010  . TOBACCO ABUSE 07/17/2009  . CERVICALGIA 02/20/2009  . COPD type B ([place]) 05/08/2008  . CARPAL TUNNEL SYNDROME, BILATERAL 01/16/2008  . ABDOMIAL BRUIT 04/19/2007  . PANIC DISORDER 04/18/2007  . GERD 04/18/2007  . DEGENERATIVE JOINT DISEASE, GENERALIZED 04/18/2007  . HELICOBACTER PYLORI GASTRITIS, HX OF 04/18/2007    Past Surgical History:  Procedure Laterality Date  . APPENDECTOMY    . OOPHORECTOMY       OB History    Gravida  1   Para  1   Term  1   Preterm      AB      Living  1     SAB      TAB      Ectopic      Multiple      Live Births               Home Medications    Prior to Admission medications   Medication Sig Start Date End Date Taking? Authorizing Provider  atorvastatin (LIPITOR) 40 MG tablet Take 1 tablet (40 mg total) by mouth daily. 08/06/18   Burchette, Alinda Sierras, MD  busPIRone (BUSPAR) 7.5 MG tablet Take 1 tablet (7.5 mg total) by mouth 2 (two) times daily. 05/14/19   Burchette, Alinda Sierras, MD  dicyclomine (BENTYL) 10 MG capsule TAKE 1 CAPSULE (10 MG TOTAL) BY MOUTH 2 (TWO) TIMES DAILY AS  NEEDED FOR SPASMS. 06/17/18   Doran Stabler, MD  hyoscyamine (LEVSIN) 0.125 MG tablet Take 1 tablet (0.125 mg total) by mouth daily. 05/25/18   Doran Stabler, MD  omeprazole (PRILOSEC) 20 MG capsule TAKE ONE CAPSULE BY MOUTH EVERY DAY 08/27/18   Burchette, Alinda Sierras, MD  oxyCODONE (ROXICODONE) 15 MG immediate release tablet Take one tablet every 6 hours as needed for pain.  May refill in two months 04/16/19   Burchette, Alinda Sierras, MD  oxyCODONE (ROXICODONE) 15 MG immediate release tablet Take 1 tablet (15 mg total) by mouth every 6 (six) hours as needed for pain. 04/16/19   Burchette, Alinda Sierras, MD  oxyCODONE (ROXICODONE) 15 MG immediate release tablet Take 1 tablet (15 mg total) by mouth every 6 (six) hours as  needed for pain. 04/16/19   Burchette, Alinda Sierras, MD  predniSONE (DELTASONE) 10 MG tablet Taper as follows: 4-4-4-3-3-2-2-1-1 04/16/19   Burchette, Alinda Sierras, MD  SUMAtriptan (IMITREX) 50 MG tablet TAKE 1 TABLET AS NEEDED FOR MIGRAINE MAY REPEAT IN 2 HRS IF HEADACHE PERSISTS 07/26/17   Burchette, Alinda Sierras, MD  SYMBICORT 80-4.5 MCG/ACT inhaler TAKE 2 PUFFS BY MOUTH TWICE A DAY 06/05/19   Burchette, Alinda Sierras, MD  tiotropium (SPIRIVA HANDIHALER) 18 MCG inhalation capsule Place 1 capsule (18 mcg total) into inhaler and inhale daily. 07/31/18   Burchette, Alinda Sierras, MD  VENTOLIN HFA 108 (90 Base) MCG/ACT inhaler INHALE 2 PUFFS INTO LUNGS EVERY 4 HOURS AS NEEDED FOR WHEEZING OR SHORTNESS OF BREATH 03/08/19   Burchette, Alinda Sierras, MD    Family History Family History  Problem Relation Age of Onset  . Diabetes Mother   . Cancer Father        lung smoke   . Heart disease Father   . Clotting disorder Brother   . Breast cancer Paternal Aunt   . Stomach cancer Paternal Uncle   . Esophageal cancer Neg Hx   . Colon cancer Neg Hx     Social History Social History   Tobacco Use  . Smoking status: Current Every Day Smoker    Packs/day: 1.00    Years: 50.00    Pack years: 50.00    Types: Cigarettes  . Smokeless tobacco: Never Used  . Tobacco comment: Trying nicotine patches, made her nauseous at first, going to cut patches in half.  Substance Use Topics  . Alcohol use: No  . Drug use: No     Allergies   Aspirin   Review of Systems Review of Systems  Constitutional: Positive for appetite change. Negative for chills and fever.  HENT: Negative.  Negative for congestion.   Respiratory: Negative.  Negative for cough and shortness of breath.   Cardiovascular: Positive for chest pain (See HPI.).  Gastrointestinal: Negative.  Negative for abdominal pain, nausea and vomiting.  Genitourinary: Negative for dysuria.  Musculoskeletal: Negative.  Negative for myalgias.  Skin: Positive for color change.   Neurological: Positive for syncope and weakness.     Physical Exam Updated Vital Signs BP 130/76   Pulse 85   Temp 98 F (36.7 C) (Oral)   Resp 14   Ht 5\' 7"  (1.702 m)   Wt 62.6 kg   SpO2 99%   BMI 21.61 kg/m   Physical Exam Vitals signs and nursing note reviewed.  Constitutional:      Appearance: She is well-developed.  HENT:     Head: Normocephalic.  Neck:     Musculoskeletal: Normal range of motion  and neck supple.  Cardiovascular:     Rate and Rhythm: Normal rate and regular rhythm.     Heart sounds: No murmur.  Pulmonary:     Effort: Pulmonary effort is normal.     Breath sounds: No wheezing, rhonchi or rales.  Chest:     Chest wall: Tenderness present.  Abdominal:     General: Bowel sounds are normal.     Palpations: Abdomen is soft.     Tenderness: There is no abdominal tenderness. There is no guarding or rebound.  Musculoskeletal: Normal range of motion.  Skin:    General: Skin is warm and dry.     Findings: No rash.  Neurological:     Mental Status: She is alert and oriented to person, place, and time.      ED Treatments / Results  Labs (all labs ordered are listed, but only abnormal results are displayed) Labs Reviewed  CBC WITH DIFFERENTIAL/PLATELET  COMPREHENSIVE METABOLIC PANEL  RAPID URINE DRUG SCREEN, HOSP PERFORMED  ETHANOL    EKG None  Radiology No results found.  Procedures Procedures (including critical care time)  Medications Ordered in ED Medications - No data to display   Initial Impression / Assessment and Plan / ED Course  I have reviewed the triage vital signs and the nursing notes.  Pertinent labs & imaging results that were available during my care of the patient were reviewed by me and considered in my medical decision making (see chart for details).        Patient to ED after syncopal episode, CPR, back to baseline on arrival to ED. No chest pain, SOB at any time. Patient reports "not feeling well" prior to  event, which was witnessed by daughter who initiated CPR. She admits to having been drinking alcohol today, denies taking any additional doses of her 15 mg oxycodone that what is prescribed. No cardiac history.   The patient is comfortable and well appearing in ED. Labs are reassuring. EKG shows a NSR, CXR clear. She is seen and evaluated by Dr. Christy Gentles who feels she will require admission for further management/evaluation of mixed picture narcotic and alcohol use vs cardiogenic syncope.   Discussed with dR. Maudie Mercury [facility], who accepts the patient for admission.   Final Clinical Impressions(s) / ED Diagnoses   Final diagnoses:  None   1. Syncope  ED Discharge Orders    None       Charlann Lange, Hershal Coria 06/10/19 [id number]    Ripley Fraise, MD 06/10/19 [phone]

## 2019-06-09 NOTE — ED Provider Notes (Signed)
Patient seen/examined in the Emergency Department in conjunction with Advanced Practice Provider [facility] Patient presents after episode of unresponsiveness, daughter reports pt was apneic/cyanotic and required CPR She is now back to baseline Exam : awake/alert, no acute distress.  No heart murmurs noted Plan: will need admission for monitoring/evaluation     Ripley Fraise, MD 06/09/19 2350

## 2019-06-10 ENCOUNTER — Emergency Department (HOSPITAL_COMMUNITY): Payer: BC Managed Care – PPO

## 2019-06-10 ENCOUNTER — Observation Stay (HOSPITAL_COMMUNITY): Payer: BC Managed Care – PPO

## 2019-06-10 ENCOUNTER — Observation Stay (HOSPITAL_BASED_OUTPATIENT_CLINIC_OR_DEPARTMENT_OTHER): Payer: BC Managed Care – PPO

## 2019-06-10 DIAGNOSIS — E876 Hypokalemia: Secondary | ICD-10-CM | POA: Diagnosis present

## 2019-06-10 DIAGNOSIS — R55 Syncope and collapse: Secondary | ICD-10-CM | POA: Diagnosis present

## 2019-06-10 DIAGNOSIS — J449 Chronic obstructive pulmonary disease, unspecified: Secondary | ICD-10-CM

## 2019-06-10 DIAGNOSIS — R4020 Unspecified coma: Secondary | ICD-10-CM | POA: Diagnosis not present

## 2019-06-10 DIAGNOSIS — K8020 Calculus of gallbladder without cholecystitis without obstruction: Secondary | ICD-10-CM | POA: Diagnosis not present

## 2019-06-10 DIAGNOSIS — K828 Other specified diseases of gallbladder: Secondary | ICD-10-CM | POA: Diagnosis not present

## 2019-06-10 LAB — CBC
HCT: 42.1 % (ref 36.0–46.0)
Hemoglobin: 13.9 g/dL (ref 12.0–15.0)
MCH: 32.4 pg (ref 26.0–34.0)
MCHC: 33 g/dL (ref 30.0–36.0)
MCV: 98.1 fL (ref 80.0–100.0)
Platelets: 195 10*3/uL (ref 150–400)
RBC: 4.29 MIL/uL (ref 3.87–5.11)
RDW: 13.6 % (ref 11.5–15.5)
WBC: 12 10*3/uL — ABNORMAL HIGH (ref 4.0–10.5)
nRBC: 0 % (ref 0.0–0.2)

## 2019-06-10 LAB — HIV ANTIBODY (ROUTINE TESTING W REFLEX): HIV Screen 4th Generation wRfx: NONREACTIVE

## 2019-06-10 LAB — URINALYSIS, ROUTINE W REFLEX MICROSCOPIC
Bilirubin Urine: NEGATIVE
Glucose, UA: NEGATIVE mg/dL
Hgb urine dipstick: NEGATIVE
Ketones, ur: NEGATIVE mg/dL
Leukocytes,Ua: NEGATIVE
Nitrite: NEGATIVE
Protein, ur: NEGATIVE mg/dL
Specific Gravity, Urine: 1.004 — ABNORMAL LOW (ref 1.005–1.030)
pH: 6 (ref 5.0–8.0)

## 2019-06-10 LAB — RAPID URINE DRUG SCREEN, HOSP PERFORMED
Amphetamines: NOT DETECTED
Barbiturates: NOT DETECTED
Benzodiazepines: POSITIVE — AB
Cocaine: POSITIVE — AB
Opiates: NOT DETECTED
Tetrahydrocannabinol: NOT DETECTED

## 2019-06-10 LAB — COMPREHENSIVE METABOLIC PANEL
ALT: 53 U/L — ABNORMAL HIGH (ref 0–44)
AST: 62 U/L — ABNORMAL HIGH (ref 15–41)
Albumin: 3.4 g/dL — ABNORMAL LOW (ref 3.5–5.0)
Alkaline Phosphatase: 77 U/L (ref 38–126)
Anion gap: 12 (ref 5–15)
BUN: 8 mg/dL (ref 6–20)
CO2: 24 mmol/L (ref 22–32)
Calcium: 8.5 mg/dL — ABNORMAL LOW (ref 8.9–10.3)
Chloride: 101 mmol/L (ref 98–111)
Creatinine, Ser: 0.67 mg/dL (ref 0.44–1.00)
GFR calc Af Amer: >60 mL/min (ref >60)
GFR calc non Af Amer: >60 mL/min (ref >60)
Glucose, Bld: 107 mg/dL — ABNORMAL HIGH (ref 70–99)
Potassium: 3.7 mmol/L (ref 3.5–5.1)
Sodium: 137 mmol/L (ref 135–145)
Total Bilirubin: 0.4 mg/dL (ref 0.3–1.2)
Total Protein: 6.5 g/dL (ref 6.5–8.1)

## 2019-06-10 LAB — ECHOCARDIOGRAM COMPLETE
Height: 66 in
Weight: 2232 oz

## 2019-06-10 LAB — TROPONIN I (HIGH SENSITIVITY)
Troponin I (High Sensitivity): 6 ng/L (ref <18)
Troponin I (High Sensitivity): 6 ng/L (ref <18)
Troponin I (High Sensitivity): 7 ng/L (ref <18)

## 2019-06-10 LAB — CK TOTAL AND CKMB (NOT AT ARMC)
CK, MB: 3.3 ng/mL (ref 0.5–5.0)
Relative Index: INVALID (ref 0.0–2.5)
Total CK: 82 U/L (ref 38–234)

## 2019-06-10 LAB — D-DIMER, QUANTITATIVE: D-Dimer, Quant: 2.5 ug/mL-FEU — ABNORMAL HIGH (ref 0.00–0.50)

## 2019-06-10 LAB — SARS CORONAVIRUS 2 (TAT 6-24 HRS): SARS Coronavirus 2: NEGATIVE

## 2019-06-10 MED ORDER — PANTOPRAZOLE SODIUM 40 MG PO TBEC
40.0000 mg | DELAYED_RELEASE_TABLET | Freq: Every day | ORAL | Status: DC
  Administered 2019-06-10: 40 mg via ORAL
  Filled 2019-06-10: qty 1

## 2019-06-10 MED ORDER — OXYCODONE HCL 15 MG PO TABS: 7.5000 mg | ORAL_TABLET | Freq: Four times a day (QID) | ORAL | 0 refills | Status: DC | PRN

## 2019-06-10 MED ORDER — POTASSIUM CHLORIDE IN NACL 20-0.9 MEQ/L-% IV SOLN
INTRAVENOUS | Status: AC
  Administered 2019-06-10: 03:00:00 via INTRAVENOUS
  Filled 2019-06-10: qty 1000

## 2019-06-10 MED ORDER — OXYCODONE HCL 5 MG PO TABS
5.0000 mg | ORAL_TABLET | Freq: Four times a day (QID) | ORAL | Status: DC | PRN
  Administered 2019-06-10: 5 mg via ORAL
  Filled 2019-06-10: qty 1

## 2019-06-10 MED ORDER — ACETAMINOPHEN 325 MG PO TABS
650.0000 mg | ORAL_TABLET | Freq: Four times a day (QID) | ORAL | Status: DC | PRN
  Administered 2019-06-10 (×2): 650 mg via ORAL
  Filled 2019-06-10 (×2): qty 2

## 2019-06-10 MED ORDER — ACETAMINOPHEN 650 MG RE SUPP: 650.0000 mg | Freq: Four times a day (QID) | RECTAL | Status: DC | PRN

## 2019-06-10 MED ORDER — BUSPIRONE HCL 5 MG PO TABS
7.5000 mg | ORAL_TABLET | Freq: Two times a day (BID) | ORAL | Status: DC
  Filled 2019-06-10: qty 2

## 2019-06-10 MED ORDER — IOHEXOL 350 MG/ML SOLN
100.0000 mL | Freq: Once | INTRAVENOUS | Status: AC | PRN
  Administered 2019-06-10: 100 mL via INTRAVENOUS

## 2019-06-10 MED ORDER — ALBUTEROL SULFATE (2.5 MG/3ML) 0.083% IN NEBU: 2.5000 mg | INHALATION_SOLUTION | RESPIRATORY_TRACT | Status: DC | PRN

## 2019-06-10 NOTE — Progress Notes (Signed)
Carotid duplex has been completed.   Preliminary results in CV Proc.   Abram Sander 06/10/2019 10:07 AM

## 2019-06-10 NOTE — ED Notes (Signed)
Delay in lab draw,  Pt currently in CT 

## 2019-06-10 NOTE — H&P (Signed)
[facility] H&P    Patient Demographics:    [name], is a 58 y.o. female  MRN: [id number]  DOB - 1961/03/08  Admit Date - 06/09/2019  Referring MD/NP/PA:  Charlann Lange  Outpatient Primary MD for the patient is Eulas Post, MD  Patient coming from:  home  Chief complaint- syncope   HPI:    [name]  is a 58 y.o. female, w h/o gerd, tobacco use, Asthma/ Copd, apparently was outside working for a while and had not eaten much or drank much all day and then came into the house and was on the sofa and her family found her unresponsive. Pt is on opioids. Family called EMS and initiated CPR  In ED,  T 98 P 85  R 14, Bp 130/76  Pox 99% on [place] Wt 62.6 kg  CXR IMPRESSION: Mild chronic bronchial thickening/interstitial coarsening. No acute findings.  Wbc 8.2, Hgb 14.2, Plt 202 Na 133, K 3.2,  Bun 9, Creatinine 0.75 Ast 86, Alt 61, Alk phos 68, T. Bili 0.5  ETOH 87  UDS pending Trop pending Urinalysis pending Covid -19 pending  Pt will be admitted for syncope and ? Cardiac arrest ?    Review of systems:    In addition to the HPI above, unable to obtain directly from patient No Fever-chills, No Headache, No changes with Vision or hearing, No problems swallowing food or Liquids, No Chest pain, Cough or Shortness of Breath, No Abdominal pain, No Nausea or Vomiting, bowel movements are regular, No Blood in stool or Urine, No dysuria, No new skin rashes or bruises, No new joints pains-aches,  No new weakness, tingling, numbness in any extremity, No recent weight gain or loss, No polyuria, polydypsia or polyphagia, No significant Mental Stressors.  All other systems reviewed and are negative.    Past History of the following :    Past Medical History:  Diagnosis Date  . ASTHMA UNSPECIFIED WITH EXACERBATION 07/21/2010  . CARPAL TUNNEL SYNDROME, BILATERAL 01/16/2008  . COPD  05/08/2008  . GERD 04/18/2007  . HELICOBACTER PYLORI GASTRITIS, HX OF 04/18/2007  . PANIC DISORDER 04/18/2007  . TOBACCO ABUSE 07/17/2009      Past Surgical History:  Procedure Laterality Date  . APPENDECTOMY    . OOPHORECTOMY        Social History:      Social History   Tobacco Use  . Smoking status: Current Every Day Smoker    Packs/day: 1.00    Years: 50.00    Pack years: 50.00    Types: Cigarettes  . Smokeless tobacco: Never Used  . Tobacco comment: Trying nicotine patches, made her nauseous at first, going to cut patches in half.  Substance Use Topics  . Alcohol use: No       Family History :     Family History  Problem Relation Age of Onset  . Diabetes Mother   . Cancer Father        lung smoke   . Heart disease Father   . Clotting disorder Brother   .  Breast cancer Paternal Aunt   . Stomach cancer Paternal Uncle   . Esophageal cancer Neg Hx   . Colon cancer Neg Hx        Home Medications:   Prior to Admission medications   Medication Sig Start Date End Date Taking? Authorizing Provider  atorvastatin (LIPITOR) 40 MG tablet Take 1 tablet (40 mg total) by mouth daily. 08/06/18   Burchette, Alinda Sierras, MD  busPIRone (BUSPAR) 7.5 MG tablet Take 1 tablet (7.5 mg total) by mouth 2 (two) times daily. 05/14/19   Burchette, Alinda Sierras, MD  dicyclomine (BENTYL) 10 MG capsule TAKE 1 CAPSULE (10 MG TOTAL) BY MOUTH 2 (TWO) TIMES DAILY AS NEEDED FOR SPASMS. 06/17/18   Doran Stabler, MD  hyoscyamine (LEVSIN) 0.125 MG tablet Take 1 tablet (0.125 mg total) by mouth daily. 05/25/18   Doran Stabler, MD  omeprazole (PRILOSEC) 20 MG capsule TAKE ONE CAPSULE BY MOUTH EVERY DAY 08/27/18   Burchette, Alinda Sierras, MD  oxyCODONE (ROXICODONE) 15 MG immediate release tablet Take one tablet every 6 hours as needed for pain.  May refill in two months 04/16/19   Burchette, Alinda Sierras, MD  oxyCODONE (ROXICODONE) 15 MG immediate release tablet Take 1 tablet (15 mg total) by mouth every 6 (six) hours  as needed for pain. 04/16/19   Burchette, Alinda Sierras, MD  oxyCODONE (ROXICODONE) 15 MG immediate release tablet Take 1 tablet (15 mg total) by mouth every 6 (six) hours as needed for pain. 04/16/19   Burchette, Alinda Sierras, MD  predniSONE (DELTASONE) 10 MG tablet Taper as follows: 4-4-4-3-3-2-2-1-1 04/16/19   Burchette, Alinda Sierras, MD  SUMAtriptan (IMITREX) 50 MG tablet TAKE 1 TABLET AS NEEDED FOR MIGRAINE MAY REPEAT IN 2 HRS IF HEADACHE PERSISTS 07/26/17   Burchette, Alinda Sierras, MD  SYMBICORT 80-4.5 MCG/ACT inhaler TAKE 2 PUFFS BY MOUTH TWICE A DAY 06/05/19   Burchette, Alinda Sierras, MD  tiotropium (SPIRIVA HANDIHALER) 18 MCG inhalation capsule Place 1 capsule (18 mcg total) into inhaler and inhale daily. 07/31/18   Burchette, Alinda Sierras, MD  VENTOLIN HFA 108 (90 Base) MCG/ACT inhaler INHALE 2 PUFFS INTO LUNGS EVERY 4 HOURS AS NEEDED FOR WHEEZING OR SHORTNESS OF BREATH 03/08/19   Burchette, Alinda Sierras, MD     Allergies:     Allergies  Allergen Reactions  . Aspirin     REACTION: had h. pylori due to too many goody powders. told not to take any more asa     Physical Exam:   Vitals  Blood pressure 130/76, pulse 85, temperature 98 F (36.7 C), temperature source Oral, resp. rate 14, height 5' 7"  (1.702 m), weight 62.6 kg, SpO2 99 %.  1.  General: axox3  2. Psychiatric: euthymic  3. Neurologic: cn2-12 intact, reflexes 2+ symmetric, diffuse with no clonus, motor 5/5 in all 4 ext  4. HEENMT:  Anicteric, pupils 1.89m symmetric, direct consensual, near intact  5. Respiratory : CTAB  6. Cardiovascular : rrr s1, s2,   7. Gastrointestinal:  Abd: soft, nt, nd, +bs  8. Skin:  Ext: no c/c/e,  No rash  9.Musculoskeletal:  Good ROM,  No adenoapthy    Data Review:    CBC Recent Labs  Lab 06/09/19 2227  WBC 8.2  HGB 14.2  HCT 42.0  PLT 202  MCV 96.8  MCH 32.7  MCHC 33.8  RDW 13.2  LYMPHSABS 1.5  MONOABS 0.5  EOSABS 0.1  BASOSABS 0.0    ------------------------------------------------------------------------------------------------------------------  Results for orders placed  or performed during the hospital encounter of 06/09/19 (from the past 48 hour(s))  CBC with Differential     Status: Abnormal   Collection Time: 06/09/19 10:27 PM  Result Value Ref Range   WBC 8.2 4.0 - 10.5 K/uL   RBC 4.34 3.87 - 5.11 MIL/uL   Hemoglobin 14.2 12.0 - 15.0 g/dL   HCT 42.0 36.0 - 46.0 %   MCV 96.8 80.0 - 100.0 fL   MCH 32.7 26.0 - 34.0 pg   MCHC 33.8 30.0 - 36.0 g/dL   RDW 13.2 11.5 - 15.5 %   Platelets 202 150 - 400 K/uL   nRBC 0.0 0.0 - 0.2 %   Neutrophils Relative % 74 %   Neutro Abs 6.1 1.7 - 7.7 K/uL   Lymphocytes Relative 18 %   Lymphs Abs 1.5 0.7 - 4.0 K/uL   Monocytes Relative 6 %   Monocytes Absolute 0.5 0.1 - 1.0 K/uL   Eosinophils Relative 1 %   Eosinophils Absolute 0.1 0.0 - 0.5 K/uL   Basophils Relative 0 %   Basophils Absolute 0.0 0.0 - 0.1 K/uL   Immature Granulocytes 1 %   Abs Immature Granulocytes 0.10 (H) 0.00 - 0.07 K/uL    Comment: Performed at [facility], [address] [place], [place] [address]  Comprehensive metabolic panel     Status: Abnormal   Collection Time: 06/09/19 10:27 PM  Result Value Ref Range   Sodium 133 (L) 135 - 145 mmol/L   Potassium 3.2 (L) 3.5 - 5.1 mmol/L   Chloride 94 (L) 98 - 111 mmol/L   CO2 24 22 - 32 mmol/L   Glucose, Bld 107 (H) 70 - 99 mg/dL   BUN 9 6 - 20 mg/dL   Creatinine, Ser 0.75 0.44 - 1.00 mg/dL   Calcium 8.8 (L) 8.9 - 10.3 mg/dL   Total Protein 6.9 6.5 - 8.1 g/dL   Albumin 3.5 3.5 - 5.0 g/dL   AST 86 (H) 15 - 41 U/L   ALT 61 (H) 0 - 44 U/L   Alkaline Phosphatase 68 38 - 126 U/L   Total Bilirubin 0.5 0.3 - 1.2 mg/dL   GFR calc non Af Amer >60 >60 mL/min   GFR calc Af Amer >60 >60 mL/min   Anion gap 15 5 - 15    Comment: Performed at [facility], [place] [address], [place], [place] [address]  Ethanol     Status: Abnormal   Collection Time:  06/09/19 10:27 PM  Result Value Ref Range   Alcohol, Ethyl (B) 87 (H) <10 mg/dL    Comment: (NOTE) Lowest detectable limit for serum alcohol is 10 mg/dL. For medical purposes only. Performed at [facility], [place] [address], [place],  [address]     Chemistries  Recent Labs  Lab 06/09/19 2227  NA 133*  K 3.2*  CL 94*  CO2 24  GLUCOSE 107*  BUN 9  CREATININE 0.75  CALCIUM 8.8*  AST 86*  ALT 61*  ALKPHOS 68  BILITOT 0.5   ------------------------------------------------------------------------------------------------------------------  ------------------------------------------------------------------------------------------------------------------ GFR: Estimated Creatinine Clearance: 73.6 mL/min (by C-G formula based on SCr of 0.75 mg/dL). Liver Function Tests: Recent Labs  Lab 06/09/19 2227  AST 86*  ALT 61*  ALKPHOS 68  BILITOT 0.5  PROT 6.9  ALBUMIN 3.5   No results for input(s): LIPASE, AMYLASE in the last 168 hours. No results for input(s): AMMONIA in the last 168 hours. Coagulation Profile: No results for input(s):  INR, PROTIME in the last 168 hours. Cardiac Enzymes: No results for input(s): CKTOTAL, CKMB, CKMBINDEX, TROPONINI in the last 168 hours. BNP (last 3 results) No results for input(s): PROBNP in the last 8760 hours. HbA1C: No results for input(s): HGBA1C in the last 72 hours. CBG: No results for input(s): GLUCAP in the last 168 hours. Lipid Profile: No results for input(s): CHOL, HDL, LDLCALC, TRIG, CHOLHDL, LDLDIRECT in the last 72 hours. Thyroid Function Tests: No results for input(s): TSH, T4TOTAL, FREET4, T3FREE, THYROIDAB in the last 72 hours. Anemia Panel: No results for input(s): VITAMINB12, FOLATE, FERRITIN, TIBC, IRON, RETICCTPCT in the last 72 hours.  --------------------------------------------------------------------------------------------------------------- Urine analysis:    Component Value Date/Time    COLORURINE YELLOW 07/23/2010 1450   APPEARANCEUR CLEAR 07/23/2010 1450   LABSPEC <1.005 (L) 07/23/2010 1450   PHURINE 6.0 07/23/2010 1450   GLUCOSEU NEGATIVE 07/23/2010 1450   HGBUR NEGATIVE 07/23/2010 1450   HGBUR negative 09/20/2007 0927   BILIRUBINUR neg 02/02/2018 1619   KETONESUR NEGATIVE 07/23/2010 1450   PROTEINUR neg 02/02/2018 1619   PROTEINUR NEGATIVE 07/23/2010 1450   UROBILINOGEN 0.2 02/02/2018 1619   UROBILINOGEN 0.2 07/23/2010 1450   NITRITE neg 02/02/2018 1619   NITRITE NEGATIVE 07/23/2010 1450   LEUKOCYTESUR Negative 02/02/2018 1619      Imaging Results:    Dg Chest Portable 1 View  Result Date: 06/10/2019 CLINICAL DATA:  Syncope. EXAM: PORTABLE CHEST 1 VIEW COMPARISON:  04/27/2018 FINDINGS: The cardiomediastinal contours are normal. Mild chronic bronchial thickening/interstitial coarsening. Pulmonary vasculature is normal. No consolidation, pleural effusion, or pneumothorax. No acute osseous abnormalities are seen. IMPRESSION: Mild chronic bronchial thickening/interstitial coarsening. No acute findings. Electronically Signed   By: Keith Rake M.D.   On: 06/10/2019 00:20    ekg nsr at 51, nl axis, nl int, no st-t changes c/w ischemia   Assessment & Plan:    Principal Problem:   Syncope Active Problems:   TOBACCO ABUSE   COPD type B (HCC)   GERD   Hypokalemia  Syncope   Tele Trop I q3h x2 D dimer, if positive CTA chest Check CT brain Check carotid ultrasound Check cardiac echo  Hypokalemia Replete Check cmp in am  Abnormal liver function Check acute hepatitis panel Check CPK Check RUQ ultrasound STOP Lipitor  Hyperlipidemia STOP Lipitor as above  Gerd Cont PPI  Copd Cont Symbicort 2puff bid prn Cont Ventolin HFA 2puff q6h prn   Chronic pain Hold pain medication for now Please resume in am  IBS Hold Bentyl Hold Levsin  DVT Prophylaxis-   Lovenox - SCDs   AM Labs Ordered, also please review Full Orders  Family  Communication: Admission, patients condition and plan of care including tests being ordered have been discussed with the patient  who indicate understanding and agree with the plan and Code Status.  Code Status:  FULL CODE, spoke with daughter Duwaine Maxin that patient will be admitted to [facility]  Admission status: Observation: Based on patients clinical presentation and evaluation of above clinical data, I have made determination that patient meets observation criteria at this time.    Time spent in minutes : 70    Jani Gravel M.D on 06/10/2019 at 1:05 AM

## 2019-06-10 NOTE — ED Notes (Signed)
Nurse collecting labs. 
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
## 2019-06-10 NOTE — ED Notes (Signed)
ED TO INPATIENT HANDOFF REPORT  ED Nurse Name and Phone #: [phone]  S Name/Age/Gender [name] 58 y.o. female Room/Bed: 024C/024C  Code Status   Code Status: Full Code  Home/SNF/Other Home Patient oriented to: self, place, time and situation Is this baseline? Yes   Triage Complete: Triage complete  Chief Complaint OD, family did CPR  Triage Note Pt BIB EMS with suspected OD. Per family pt was found unresponsive with a pulse. Family began CPR. Upon arrival, EMS states pt was alert and oriented. Pt reports she was outside doing yard work and hadn't eaten all day or yesterday and not had much to drink today. Pt endorses taking her normal opioids as prescribed. Pt VS WNL.   Allergies Allergies  Allergen Reactions  . Aspirin     REACTION: had h. pylori due to too many goody powders. told not to take any more asa    Level of Care/Admitting Diagnosis ED Disposition    ED Disposition Condition [place]: [place] [[id number]]  Level of Care: Progressive [102]  I expect the patient will be discharged within 24 hours: No (not a candidate for 5C-Observation unit)  Covid Evaluation: Asymptomatic Screening Protocol (No Symptoms)  Diagnosis: Syncope [206001]  Admitting Physician: Jani Gravel [[id number]]  Attending Physician: Jani Gravel [[id number]]  PT Class (Do Not Modify): Observation [104]  PT Acc Code (Do Not Modify): Observation [10022]       B Medical/Surgery History Past Medical History:  Diagnosis Date  . ASTHMA UNSPECIFIED WITH EXACERBATION 07/21/2010  . CARPAL TUNNEL SYNDROME, BILATERAL 01/16/2008  . COPD 05/08/2008  . GERD 04/18/2007  . HELICOBACTER PYLORI GASTRITIS, HX OF 04/18/2007  . PANIC DISORDER 04/18/2007  . TOBACCO ABUSE 07/17/2009   Past Surgical History:  Procedure Laterality Date  . APPENDECTOMY    . OOPHORECTOMY       A IV Location/Drains/Wounds Patient Lines/Drains/Airways Status   Active Line/Drains/Airways    Name:   Placement date:   Placement time:   Site:   Days:   Peripheral IV 06/09/19 Right Antecubital   06/09/19    2232    Antecubital   1          Intake/Output Last 24 hours No intake or output data in the 24 hours ending 06/10/19 0411  Labs/Imaging Results for orders placed or performed during the hospital encounter of 06/09/19 (from the past 48 hour(s))  CBC with Differential     Status: Abnormal   Collection Time: 06/09/19 10:27 PM  Result Value Ref Range   WBC 8.2 4.0 - 10.5 K/uL   RBC 4.34 3.87 - 5.11 MIL/uL   Hemoglobin 14.2 12.0 - 15.0 g/dL   HCT 42.0 36.0 - 46.0 %   MCV 96.8 80.0 - 100.0 fL   MCH 32.7 26.0 - 34.0 pg   MCHC 33.8 30.0 - 36.0 g/dL   RDW 13.2 11.5 - 15.5 %   Platelets 202 150 - 400 K/uL   nRBC 0.0 0.0 - 0.2 %   Neutrophils Relative % 74 %   Neutro Abs 6.1 1.7 - 7.7 K/uL   Lymphocytes Relative 18 %   Lymphs Abs 1.5 0.7 - 4.0 K/uL   Monocytes Relative 6 %   Monocytes Absolute 0.5 0.1 - 1.0 K/uL   Eosinophils Relative 1 %   Eosinophils Absolute 0.1 0.0 - 0.5 K/uL   Basophils Relative 0 %   Basophils Absolute 0.0 0.0 - 0.1 K/uL   Immature Granulocytes  1 %   Abs Immature Granulocytes 0.10 (H) 0.00 - 0.07 K/uL    Comment: Performed at [facility], [place] [address], [place], [place] [address]  Comprehensive metabolic panel     Status: Abnormal   Collection Time: 06/09/19 10:27 PM  Result Value Ref Range   Sodium 133 (L) 135 - 145 mmol/L   Potassium 3.2 (L) 3.5 - 5.1 mmol/L   Chloride 94 (L) 98 - 111 mmol/L   CO2 24 22 - 32 mmol/L   Glucose, Bld 107 (H) 70 - 99 mg/dL   BUN 9 6 - 20 mg/dL   Creatinine, Ser 0.75 0.44 - 1.00 mg/dL   Calcium 8.8 (L) 8.9 - 10.3 mg/dL   Total Protein 6.9 6.5 - 8.1 g/dL   Albumin 3.5 3.5 - 5.0 g/dL   AST 86 (H) 15 - 41 U/L   ALT 61 (H) 0 - 44 U/L   Alkaline Phosphatase 68 38 - 126 U/L   Total Bilirubin 0.5 0.3 - 1.2 mg/dL   GFR calc non Af Amer >60 >60 mL/min   GFR calc Af Amer >60 >60 mL/min   Anion gap 15 5 - 15     Comment: Performed at [facility], [place] [address], [place], [place] [address]  Ethanol     Status: Abnormal   Collection Time: 06/09/19 10:27 PM  Result Value Ref Range   Alcohol, Ethyl (B) 87 (H) <10 mg/dL    Comment: (NOTE) Lowest detectable limit for serum alcohol is 10 mg/dL. For medical purposes only. Performed at [facility], [place] [address] [place], [place] [address]   Comprehensive metabolic panel     Status: Abnormal   Collection Time: 06/10/19  2:00 AM  Result Value Ref Range   Sodium 137 135 - 145 mmol/L   Potassium 3.7 3.5 - 5.1 mmol/L   Chloride 101 98 - 111 mmol/L   CO2 24 22 - 32 mmol/L   Glucose, Bld 107 (H) 70 - 99 mg/dL   BUN 8 6 - 20 mg/dL   Creatinine, Ser 0.67 0.44 - 1.00 mg/dL   Calcium 8.5 (L) 8.9 - 10.3 mg/dL   Total Protein 6.5 6.5 - 8.1 g/dL   Albumin 3.4 (L) 3.5 - 5.0 g/dL   AST 62 (H) 15 - 41 U/L   ALT 53 (H) 0 - 44 U/L   Alkaline Phosphatase 77 38 - 126 U/L   Total Bilirubin 0.4 0.3 - 1.2 mg/dL   GFR calc non Af Amer >60 >60 mL/min   GFR calc Af Amer >60 >60 mL/min   Anion gap 12 5 - 15    Comment: Performed at [facility] [address] [place], [place] [address]  CBC     Status: Abnormal   Collection Time: 06/10/19  2:00 AM  Result Value Ref Range   WBC 12.0 (H) 4.0 - 10.5 K/uL   RBC 4.29 3.87 - 5.11 MIL/uL   Hemoglobin 13.9 12.0 - 15.0 g/dL   HCT 42.1 36.0 - 46.0 %   MCV 98.1 80.0 - 100.0 fL   MCH 32.4 26.0 - 34.0 pg   MCHC 33.0 30.0 - 36.0 g/dL   RDW 13.6 11.5 - 15.5 %   Platelets 195 150 - 400 K/uL   nRBC 0.0 0.0 - 0.2 %    Comment: Performed at [facility], [place] [address], [place], [place] [address]  Troponin I (High Sensitivity)     Status:  None   Collection Time: 06/10/19  2:00 AM  Result Value Ref Range   Troponin I (High Sensitivity) 6 <18 ng/L    Comment: (NOTE) Elevated high sensitivity troponin I (hsTnI) values and significant  changes across serial measurements may suggest ACS but many  other  chronic and acute conditions are known to elevate hsTnI results.  Refer to the "Links" section for chest pain algorithms and additional  guidance. Performed at [facility], [place] [address], [place], [place] [address]   D-dimer, quantitative (not at [facility])     Status: Abnormal   Collection Time: 06/10/19  2:20 AM  Result Value Ref Range   D-Dimer, Quant 2.50 (H) 0.00 - 0.50 ug/mL-FEU    Comment: (NOTE) At the manufacturer cut-off of 0.50 ug/mL FEU, this assay has been documented to exclude PE with a sensitivity and negative predictive value of 97 to 99%.  At this time, this assay has not been approved by the FDA to exclude DVT/VTE. Results should be correlated with clinical presentation. Performed at [facility], [place] [address], [place], [place] [address]   CK total and CKMB (cardiac)not at [facility]     Status: None   Collection Time: 06/10/19  2:20 AM  Result Value Ref Range   Total CK 82 38 - 234 U/L   CK, MB 3.3 0.5 - 5.0 ng/mL   Relative Index RELATIVE INDEX IS INVALID 0.0 - 2.5    Comment: WHEN CK < 100 U/L        Performed at [place] [address], [place], [place] [address]   Urine rapid drug screen (hosp performed)     Status: Abnormal   Collection Time: 06/10/19  3:20 AM  Result Value Ref Range   Opiates NONE DETECTED NONE DETECTED   Cocaine POSITIVE (A) NONE DETECTED   Benzodiazepines POSITIVE (A) NONE DETECTED   Amphetamines NONE DETECTED NONE DETECTED   Tetrahydrocannabinol NONE DETECTED NONE DETECTED   Barbiturates NONE DETECTED NONE DETECTED    Comment: (NOTE) DRUG SCREEN FOR MEDICAL PURPOSES ONLY.  IF CONFIRMATION IS NEEDED FOR ANY PURPOSE, NOTIFY LAB WITHIN 5 DAYS. LOWEST DETECTABLE LIMITS FOR URINE DRUG SCREEN Drug Class                     Cutoff (ng/mL) Amphetamine and metabolites    1000 Barbiturate and metabolites    200 Benzodiazepine                 [id number] Tricyclics and metabolites     300 Opiates and metabolites         300 Cocaine and metabolites        300 THC                            50 Performed at [facility], [place] [address], [place], [place] [address]   Urinalysis, Routine w reflex microscopic     Status: Abnormal   Collection Time: 06/10/19  3:20 AM  Result Value Ref Range   Color, Urine STRAW (A) YELLOW   APPearance CLEAR CLEAR   Specific Gravity, Urine 1.004 (L) 1.005 - 1.030   pH 6.0 5.0 - 8.0   Glucose, UA NEGATIVE NEGATIVE mg/dL   Hgb urine dipstick NEGATIVE NEGATIVE   Bilirubin Urine NEGATIVE NEGATIVE   Ketones, ur NEGATIVE NEGATIVE mg/dL   Protein, ur NEGATIVE NEGATIVE mg/dL   Nitrite NEGATIVE NEGATIVE  Leukocytes,Ua NEGATIVE NEGATIVE    Comment: Performed at [facility], [place] [address], [place], [place] [address]   Ct Head Wo Contrast  Result Date: 06/10/2019 CLINICAL DATA:  Initial evaluation for acute syncope. EXAM: CT HEAD WITHOUT CONTRAST TECHNIQUE: Contiguous axial images were obtained from the base of the skull through the vertex without intravenous contrast. COMPARISON:  None. FINDINGS: Brain: Cerebral volume within normal limits for patient age. No evidence for acute intracranial hemorrhage. No findings to suggest acute large vessel territory infarct. No mass lesion, midline shift, or mass effect. Ventricles are normal in size without evidence for hydrocephalus. No extra-axial fluid collection identified. Vascular: No hyperdense vessel identified. Skull: Scalp soft tissues demonstrate no acute abnormality. Calvarium intact. Sinuses/Orbits: Globes and orbital soft tissues within normal limits. Visualized paranasal sinuses are clear. No mastoid effusion. IMPRESSION: Negative head CT.  No acute intracranial abnormality identified. Electronically Signed   By: Jeannine Boga M.D.   On: 06/10/2019 02:05   Dg Chest Portable 1 View  Result Date: 06/10/2019 CLINICAL DATA:  Syncope. EXAM: PORTABLE CHEST 1 VIEW COMPARISON:  04/27/2018 FINDINGS: The cardiomediastinal  contours are normal. Mild chronic bronchial thickening/interstitial coarsening. Pulmonary vasculature is normal. No consolidation, pleural effusion, or pneumothorax. No acute osseous abnormalities are seen. IMPRESSION: Mild chronic bronchial thickening/interstitial coarsening. No acute findings. Electronically Signed   By: Keith Rake M.D.   On: 06/10/2019 00:20   US Abdomen Limited Ruq  Result Date: 06/10/2019 CLINICAL DATA:  Initial evaluation for elevated LFTs. EXAM: ULTRASOUND ABDOMEN LIMITED RIGHT UPPER QUADRANT COMPARISON:  None available. FINDINGS: Gallbladder: Stones and sludge present within the gallbladder lumen, with the largest stone measuring up to 9 mm. Gallbladder wall measure within normal limits of 1.3 mm. No free pericholecystic fluid. No sonographic Murphy sign elicited on exam. Common bile duct: Diameter: 4.2 mm Liver: No focal lesion identified. Liver demonstrates a coarse heterogeneous echotexture with slight nodularity of the hepatic contour. Portal vein is patent on color Doppler imaging with normal direction of blood flow towards the liver. Other: None. IMPRESSION: 1. Stones and sludge within the gallbladder lumen, with no sonographic features for acute cholecystitis. 2. No biliary dilatation. 3. Coarsened echotexture of the liver with mild nodularity of the hepatic contour, which can be seen in the setting of cirrhosis. Electronically Signed   By: Jeannine Boga M.D.   On: 06/10/2019 01:37    Pending Labs Unresulted Labs (From admission, onward)    Start     Ordered   06/10/19 0500  Hepatitis panel, acute  Tomorrow morning,   R     06/10/19 0104   06/10/19 0058  HIV antibody (Routine Testing)  Once,   STAT     06/10/19 0104   06/09/19 2341  SARS CORONAVIRUS 2 (TAT 6-12 HRS) Nasal Swab Aptima Multi Swab  (Asymptomatic/Tier 2 Patients Labs)  Once,   STAT    Question Answer Comment  Is this test for diagnosis or screening Screening   Symptomatic for COVID-19 as  defined by CDC No   Hospitalized for COVID-19 No   Admitted to ICU for COVID-19 No   Previously tested for COVID-19 No   Resident in a congregate (group) care setting No   Employed in healthcare setting Yes   Pregnant No      06/09/19 2341          Vitals/Pain Today's Vitals   06/09/19 2225 06/09/19 2233 06/10/19 0100 06/10/19 0303  BP: 130/76  118/79 138/70  Pulse: 85  83 92  Resp: 14  14 20   Temp: 98 F (36.7 C)     TempSrc: Oral     SpO2: 99%  90% 95%  Weight:  62.6 kg    Height:  5\' 7"  (1.702 m)    PainSc:  0-No pain      Isolation Precautions No active isolations  Medications Medications  acetaminophen (TYLENOL) tablet 650 mg (650 mg Oral Given 06/10/19 0208)    Or  acetaminophen (TYLENOL) suppository 650 mg ( Rectal See Alternative 06/10/19 0208)  0.9 % NaCl with KCl 20 mEq/ L  infusion ( Intravenous New Bag/Given 06/10/19 0309)    Mobility walks Low fall risk   Focused Assessments    R Recommendations: See Admitting Provider Note  Report given to:   Additional Notes:

## 2019-06-10 NOTE — Discharge Instructions (Signed)
Syncope °Syncope is when you pass out (faint) for a short time. It is caused by a sudden decrease in blood flow to the brain. Signs that you may be about to pass out include: °· Feeling dizzy or light-headed. °· Feeling sick to your stomach (nauseous). °· Seeing all white or all black. °· Having cold, clammy skin. °If you pass out, get help right away. Call your local emergency services (911 in the [place] Do not drive yourself to the hospital. °Follow these instructions at home: °Watch for any changes in your symptoms. Take these actions to stay safe and help with your symptoms: °Lifestyle °· Do not drive, use machinery, or play sports until your doctor says it is okay. °· Do not drink alcohol. °· Do not use any products that contain nicotine or tobacco, such as cigarettes and e-cigarettes. If you need help quitting, ask your doctor. °· Drink enough fluid to keep your pee (urine) pale yellow. °General instructions °· Take over-the-counter and prescription medicines only as told by your doctor. °· If you are taking blood pressure or heart medicine, sit up and stand up slowly. Spend a few minutes getting ready to sit and then stand. This can help you feel less dizzy. °· Have someone stay with you until you feel stable. °· If you start to feel like you might pass out, lie down right away and raise (elevate) your feet above the level of your heart. Breathe deeply and steadily. Wait until all of the symptoms are gone. °· Keep all follow-up visits as told by your doctor. This is important. °Get help right away if: °· You have a very bad headache. °· You pass out once or more than once. °· You have pain in your chest, belly, or back. °· You have a very fast or uneven heartbeat (palpitations). °· It hurts to breathe. °· You are bleeding from your mouth or your bottom (rectum). °· You have black or tarry poop (stool). °· You have jerky movements that you cannot control (seizure). °· You are confused. °· You have trouble  walking. °· You are very weak. °· You have vision problems. °These symptoms may be an emergency. Do not wait to see if the symptoms will go away. Get medical help right away. Call your local emergency services (911 in the [place] Do not drive yourself to the hospital. °Summary °· Syncope is when you pass out (faint) for a short time. It is caused by a sudden decrease in blood flow to the brain. °· Signs that you may be about to faint include feeling dizzy, light-headed, or sick to your stomach, seeing all white or all black, or having cold, clammy skin. °· If you start to feel like you might pass out, lie down right away and raise (elevate) your feet above the level of your heart. Breathe deeply and steadily. Wait until all of the symptoms are gone. °This information is not intended to replace advice given to you by your health care provider. Make sure you discuss any questions you have with your health care provider. °Document Released: 03/14/2008 Document Revised: 11/08/2017 Document Reviewed: 11/08/2017 °Elsevier Patient Education © 2020 [employer] Inc[employer] ° °

## 2019-06-10 NOTE — Progress Notes (Signed)
Discharge order written - instructions reviewed and given to patient. All questions answered. Medications from pharmacy returned to patient. Patient discharged via wheelchair in stable condition in the care of her daughter.

## 2019-06-10 NOTE — Progress Notes (Signed)
  Echocardiogram 2D Echocardiogram has been performed.  [name] 06/10/2019, 9:49 AM

## 2019-06-13 LAB — HEPATITIS PANEL, ACUTE
HCV Ab: <0.1 s/co ratio (ref 0.0–0.9)
Hep A IgM: NEGATIVE
Hep B C IgM: NEGATIVE
Hepatitis B Surface Ag: NEGATIVE

## 2019-06-14 ENCOUNTER — Other Ambulatory Visit: Payer: Self-pay

## 2019-06-14 ENCOUNTER — Telehealth (INDEPENDENT_AMBULATORY_CARE_PROVIDER_SITE_OTHER): Payer: BC Managed Care – PPO | Admitting: Family Medicine

## 2019-06-14 DIAGNOSIS — R7401 Elevation of levels of liver transaminase levels: Secondary | ICD-10-CM

## 2019-06-14 DIAGNOSIS — R740 Nonspecific elevation of levels of transaminase and lactic acid dehydrogenase [LDH]: Secondary | ICD-10-CM

## 2019-06-14 DIAGNOSIS — R55 Syncope and collapse: Secondary | ICD-10-CM

## 2019-06-14 DIAGNOSIS — E876 Hypokalemia: Secondary | ICD-10-CM
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
## 2019-06-19 ENCOUNTER — Encounter: Payer: Self-pay | Admitting: Cardiology

## 2019-06-19 ENCOUNTER — Other Ambulatory Visit: Payer: Self-pay

## 2019-06-19 ENCOUNTER — Ambulatory Visit (INDEPENDENT_AMBULATORY_CARE_PROVIDER_SITE_OTHER): Payer: BC Managed Care – PPO | Admitting: Cardiology

## 2019-06-19 VITALS — BP 140/79 | HR 95 | Ht 67.0 in | Wt 140.0 lb

## 2019-06-19 DIAGNOSIS — R55 Syncope and collapse: Secondary | ICD-10-CM | POA: Diagnosis not present

## 2019-06-19 DIAGNOSIS — Z716 Tobacco abuse counseling: Secondary | ICD-10-CM

## 2019-06-19 NOTE — Patient Instructions (Signed)
Medication Instructions:  Your Physician recommend you continue on your current medication as directed.    If you need a refill on your cardiac medications before your next appointment, please call your pharmacy.   Lab work: None  Testing/Procedures: None  Follow-Up: At [facility], you and your health needs are our priority.  As part of our continuing mission to provide you with exceptional heart care, we have created designated Provider Care Teams.  These Care Teams include your primary Cardiologist (physician) and Advanced Practice Providers (APPs -  Physician Assistants and Nurse Practitioners) who all work together to provide you with the care you need, when you need it. You will need a follow up appointment as needed.  Please call our office 2 months in advance to schedule this appointment.  You may see Dr. Harrell Gave or one of the following Advanced Practice Providers on your designated Care Team:   Rosaria Ferries, PA-C . Jory Sims, DNP, ANP

## 2019-06-19 NOTE — Progress Notes (Signed)
Cardiology Office Note:    Date:  06/19/2019   ID:  [name], DOB June 09, 1961, MRN [id number]  PCP:  Eulas Post, MD  Cardiologist:  Buford Dresser, MD PhD  Referring MD: Eulas Post, MD   CC: post hospitalization follow up after syncope  History of Present Illness:    [name] is a 58 y.o. female with a hx of tobacco use, COPD, chronic pain, anxiety who is seen as a new consult at the request of Burchette, Alinda Sierras, MD for the evaluation and management of syncope. She was seen in the ER and admitted for observation for this on 06/10/19.  Patient states that she had a valium in her system because her sister gave it to her the night before her ER visit; states it was because sister thought she was having a panic attack. She is not sure how the cocaine got in her system, denies using. Did have someone make her a drink at a social event, thinks it might have possibly been spiked/drugged.  The day of her event, she had been outside all day in the heat, not much to eat or drink. Was trying to get lawnmower to work. After that she went to a benefit, had half a glass of wine, felt strange. Came home, felt warm, daughter went to get a washcloth and the next thing she knew EMS was there.  No chest pain, shortness of breath before/during/after event. Heart rate might have been fast but she isn't sure. Never had anything like this before. Since that time, her ribs are very sore.   From ER/Obs admission: Stable vitals on arrival ECG without acute changes, NSR HsTn negative CBC unremarkable Mild hyponatremia, Na 133; hypokalemia 3.2 AST 86, ALT 61 (prior normal); normal alk phos and bili Ethanol 87 mg/dL Positive for cocaine and benzodiazepenes Negative for opiates Covid negative HIV negative D-dimer elevated at 2.5 Echo unremarkable Carotid dopplers without significant stenosis  Past Medical History:  Diagnosis Date  . ASTHMA UNSPECIFIED WITH EXACERBATION  07/21/2010  . CARPAL TUNNEL SYNDROME, BILATERAL 01/16/2008  . COPD 05/08/2008  . GERD 04/18/2007  . HELICOBACTER PYLORI GASTRITIS, HX OF 04/18/2007  . PANIC DISORDER 04/18/2007  . TOBACCO ABUSE 07/17/2009    Past Surgical History:  Procedure Laterality Date  . APPENDECTOMY    . OOPHORECTOMY      Current Medications: Current Outpatient Medications on File Prior to Visit  Medication Sig  . atorvastatin (LIPITOR) 40 MG tablet Take 1 tablet (40 mg total) by mouth daily.  . busPIRone (BUSPAR) 7.5 MG tablet Take 1 tablet (7.5 mg total) by mouth 2 (two) times daily.  Marland Kitchen dicyclomine (BENTYL) 10 MG capsule TAKE 1 CAPSULE (10 MG TOTAL) BY MOUTH 2 (TWO) TIMES DAILY AS NEEDED FOR SPASMS.  . hyoscyamine (LEVSIN) 0.125 MG tablet Take 1 tablet (0.125 mg total) by mouth daily. (Patient taking differently: Take 0.125 mg by mouth every 6 (six) hours as needed for cramping. )  . omeprazole (PRILOSEC) 20 MG capsule TAKE ONE CAPSULE BY MOUTH EVERY DAY (Patient taking differently: Take 20 mg by mouth daily. )  . oxyCODONE (ROXICODONE) 15 MG immediate release tablet Take 0.5 tablets (7.5 mg total) by mouth every 6 (six) hours as needed for pain.  . SUMAtriptan (IMITREX) 50 MG tablet TAKE 1 TABLET AS NEEDED FOR MIGRAINE MAY REPEAT IN 2 HRS IF HEADACHE PERSISTS (Patient taking differently: Take 50 mg by mouth every 2 (two) hours as needed for migraine. )  .  SYMBICORT 80-4.5 MCG/ACT inhaler TAKE 2 PUFFS BY MOUTH TWICE A DAY (Patient taking differently: Inhale 2 puffs into the lungs 2 (two) times daily as needed (SOB). )  . tiotropium (SPIRIVA HANDIHALER) 18 MCG inhalation capsule Place 1 capsule (18 mcg total) into inhaler and inhale daily.  . VENTOLIN HFA 108 (90 Base) MCG/ACT inhaler INHALE 2 PUFFS INTO LUNGS EVERY 4 HOURS AS NEEDED FOR WHEEZING OR SHORTNESS OF BREATH (Patient taking differently: Inhale 2 puffs into the lungs every 4 (four) hours as needed for wheezing or shortness of breath. )   No current  facility-administered medications on file prior to visit.      Allergies:   Aspirin   Social History   Socioeconomic History  . Marital status: Divorced    Spouse name: Not on file  . Number of children: 1  . Years of education: Not on file  . Highest education level: Not on file  Occupational History  . Occupation: Forensic psychologist: SELF-EMPLOYED  Social Needs  . Financial resource strain: Not on file  . Food insecurity    Worry: Not on file    Inability: Not on file  . Transportation needs    Medical: Not on file    Non-medical: Not on file  Tobacco Use  . Smoking status: Current Every Day Smoker    Packs/day: 1.00    Years: 50.00    Pack years: 50.00    Types: Cigarettes  . Smokeless tobacco: Never Used  . Tobacco comment: Trying nicotine patches, made her nauseous at first, going to cut patches in half.  Substance and Sexual Activity  . Alcohol use: No  . Drug use: No  . Sexual activity: Not on file  Lifestyle  . Physical activity    Days per week: Not on file    Minutes per session: Not on file  . Stress: Not on file  Relationships  . Social Herbalist on phone: Not on file    Gets together: Not on file    Attends religious service: Not on file    Active member of club or organization: Not on file    Attends meetings of clubs or organizations: Not on file    Relationship status: Not on file  Other Topics Concern  . Not on file  Social History Narrative   lives with bf    cna cares of elderly person   Self employed    Long term smoker     Family History: The patient's family history includes Breast cancer in her paternal aunt; Cancer in her father; Clotting disorder in her brother; Diabetes in her mother; Heart disease in her father; Stomach cancer in her paternal uncle. There is no history of Esophageal cancer or Colon cancer.  ROS:   Please see the history of present illness.  Additional pertinent ROS: Constitutional: Negative for chills,  fever, night sweats, unintentional weight loss  HENT: Negative for ear pain and hearing loss.   Eyes: Negative for loss of vision and eye pain.  Respiratory: Positive for cough, sputum, wheezing.   Cardiovascular: See HPI. Gastrointestinal: Negative for abdominal pain, melena, and hematochezia.  Genitourinary: Negative for dysuria and hematuria.  Musculoskeletal: Negative for falls and myalgias.  Skin: Negative for itching and rash.  Neurological: Negative for focal weakness, focal sensory changes.  Endo/Heme/Allergies: Does not bruise/bleed easily.     EKGs/Labs/Other Studies Reviewed:    The following studies were reviewed today: Carotid dopplers without significant  stenosis 06/10/19 Echo with EF 55-60%, grade 1 DD, no structural abnormalities  EKG:  EKG is personally reviewed.  The ekg 06/09/19 demonstrates NSR  Recent Labs: 07/31/2018: TSH 1.07 06/10/2019: ALT 53; BUN 8; Creatinine, Ser 0.67; Hemoglobin 13.9; Platelets 195; Potassium 3.7; Sodium 137  Recent Lipid Panel    Component Value Date/Time   CHOL 256 (H) 07/31/2018 1345   TRIG 219.0 (H) 07/31/2018 1345   HDL 64.10 07/31/2018 1345   CHOLHDL 4 07/31/2018 1345   VLDL 43.8 (H) 07/31/2018 1345   LDLCALC 7 02/10/2014 1150   LDLDIRECT 161.0 07/31/2018 1345    Physical Exam:    VS:  BP 140/79   Pulse 95   Ht 5' 7"  (1.702 m)   Wt 140 lb (63.5 kg)   SpO2 93%   BMI 21.93 kg/m     Wt Readings from Last 3 Encounters:  06/19/19 140 lb (63.5 kg)  06/10/19 139 lb 8 oz (63.3 kg)  04/16/19 139 lb 1.6 oz (63.1 kg)    GEN: Well nourished, well developed in no acute distress HEENT: Normal, moist mucous membranes NECK: No JVD CARDIAC: regular rhythm, normal S1 and S2, no murmurs, rubs, gallops.  VASCULAR: Radial and DP pulses 2+ bilaterally. No carotid bruits RESPIRATORY:  Clear to auscultation but expiratory wheeze noted bilaterally with mild rhonchi in mid right lobe ABDOMEN: Soft, non-tender, non-distended  MUSCULOSKELETAL:  Ambulates independently SKIN: Warm and dry, no edema NEUROLOGIC:  Alert and oriented x 3. No focal neuro deficits noted. PSYCHIATRIC:  Normal affect    ASSESSMENT:    1. Syncope and collapse   2. Tobacco abuse counseling    PLAN:    Syncope: only prodrome of warmth. Normal ECG, no elevation in hsTn, normal echo. In the setting of poor PO intake, heat exposure, alcohol use. Admits to her sister giving her a valium (discovered after it was already given to her), denies cocaine use but did accept drinks from a stranger at an event and thinks it might have been spiked -no high risk cardiac features, no indication for further evaluation at this time -counseled on safety, avoidance of substance -has already talked with Dr. Elease Hashimoto about this, planned weaning of opiates.  Tobacco abuse counseling: The patient was counseled on tobacco cessation today for 5 minutes.  Counseling included reviewing the risks of smoking tobacco products, how it impacts the patient's current medical diagnoses and different strategies for quitting.  Pharmacotherapy to aid in tobacco cessation was not prescribed today. She plans to call 1-800-Quit-now for support.  Plan for follow up: as needed  Medication Adjustments/Labs and Tests Ordered: Current medicines are reviewed at length with the patient today.  Concerns regarding medicines are outlined above.  No orders of the defined types were placed in this encounter.  No orders of the defined types were placed in this encounter.   Patient Instructions  Medication Instructions:  Your Physician recommend you continue on your current medication as directed.    If you need a refill on your cardiac medications before your next appointment, please call your pharmacy.   Lab work: None  Testing/Procedures: None  Follow-Up: At [facility], you and your health needs are our priority.  As part of our continuing mission to provide you with  exceptional heart care, we have created designated Provider Care Teams.  These Care Teams include your primary Cardiologist (physician) and Advanced Practice Providers (APPs -  Physician Assistants and Nurse Practitioners) who all work together to provide you with the  care you need, when you need it. You will need a follow up appointment as needed.  Please call our office 2 months in advance to schedule this appointment.  You may see Dr. Harrell Gave or one of the following Advanced Practice Providers on your designated Care Team:   Rosaria Ferries, PA-C . Jory Sims, DNP, ANP     Signed, Buford Dresser, MD PhD 06/19/2019 4:35 PM    [place]

## 2019-06-21 ENCOUNTER — Other Ambulatory Visit: Payer: Self-pay

## 2019-06-21 ENCOUNTER — Other Ambulatory Visit: Payer: BC Managed Care – PPO

## 2019-06-21 DIAGNOSIS — R7401 Elevation of levels of liver transaminase levels: Secondary | ICD-10-CM

## 2019-06-21 DIAGNOSIS — E876 Hypokalemia: Secondary | ICD-10-CM | POA: Diagnosis not present

## 2019-06-21 DIAGNOSIS — R740 Nonspecific elevation of levels of transaminase and lactic acid dehydrogenase [LDH]: Secondary | ICD-10-CM | POA: Diagnosis not present

## 2019-06-21 NOTE — Addendum Note (Signed)
Addended by: Elmer Picker on: 06/21/2019 04:18 PM   Modules accepted: Orders

## 2019-06-22 LAB — COMPREHENSIVE METABOLIC PANEL
AG Ratio: 1.3 (calc) (ref 1.0–2.5)
ALT: 20 U/L (ref 6–29)
AST: 16 U/L (ref 10–35)
Albumin: 3.8 g/dL (ref 3.6–5.1)
Alkaline phosphatase (APISO): 104 U/L (ref 37–153)
BUN: 14 mg/dL (ref 7–25)
CO2: 28 mmol/L (ref 20–32)
Calcium: 9.2 mg/dL (ref 8.6–10.4)
Chloride: 101 mmol/L (ref 98–110)
Creat: 0.85 mg/dL (ref 0.50–0.99)
Globulin: 3 g/dL (calc) (ref 1.9–3.7)
Glucose, Bld: 103 mg/dL — ABNORMAL HIGH (ref 65–99)
Potassium: 4 mmol/L (ref 3.5–5.3)
Sodium: 136 mmol/L (ref 135–146)
Total Bilirubin: 0.3 mg/dL (ref 0.2–1.2)
Total Protein: 6.8 g/dL (ref 6.1–8.1)

## 2019-06-25 ENCOUNTER — Telehealth: Payer: Self-pay

## 2019-06-25 NOTE — Telephone Encounter (Signed)
Patient has been called and discussed with patient.  Copied from [place] [id number]. Topic: Quick Communication - Lab Results (Clinic Use ONLY) >> Jun 25, 2019  9:16 AM Lennox Solders wrote: Pt would like Bjorn Hallas to call her and go over her lab results. The results was release to mychart. Pt has questions

## 2019-06-25 NOTE — Telephone Encounter (Signed)
Do you want me to advise to use Tylenol and pain cream and see if she will accept referral to pain management?

## 2019-06-25 NOTE — Telephone Encounter (Signed)
OK to refer.

## 2019-06-25 NOTE — Telephone Encounter (Signed)
Did not see any comment regarding broken ribs on recent x-ray of chest or CT of chest.  She should be tapered off oxycodone at this time.  She can continue with regular Tylenol.  She could try over-the-counter lidocaine patch.  We can offer referral to pain management if she is interested

## 2019-06-25 NOTE — Telephone Encounter (Signed)
Patient called asking for a refill on her pain medicaitons. She stated she has broken ribs.  Per OV notes from 06/14/2019,  "-We have explained that we do not feel comfortable with ongoing opioid use at this time.  We have agreed to tapering regimen of 1/2 tablet 3 times daily for 1 week then go to 1/2 tablet twice daily for 1 week then 1/2 tablet daily and then discontinue."

## 2019-06-25 NOTE — Telephone Encounter (Signed)
Called patient and gave her message. Patient has requested a referral to Dr. Tera Partridge at [facility] for pain management.   Patient also stated that she started drinking a new tea that can show up positive cocaine in a urine sample. She wanted to let you know. She also took a valium from her sister. And she stated that she also was drinking Nyquil.   OK to send referral?

## 2019-06-26 ENCOUNTER — Other Ambulatory Visit: Payer: Self-pay

## 2019-06-26 DIAGNOSIS — G8929 Other chronic pain: Secondary | ICD-10-CM

## 2019-06-26 NOTE — Telephone Encounter (Signed)
Referral has been sent for patient

## 2019-07-01 DIAGNOSIS — M545 Low back pain: Secondary | ICD-10-CM | POA: Diagnosis not present

## 2019-07-01 DIAGNOSIS — Z79899 Other long term (current) drug therapy: Secondary | ICD-10-CM | POA: Diagnosis not present

## 2019-07-01 DIAGNOSIS — F17200 Nicotine dependence, unspecified, uncomplicated: Secondary | ICD-10-CM | POA: Diagnosis not present

## 2019-07-05 ENCOUNTER — Other Ambulatory Visit: Payer: Self-pay | Admitting: Family Medicine

## 2019-07-08 ENCOUNTER — Other Ambulatory Visit: Payer: Self-pay | Admitting: Family Medicine

## 2019-07-08 DIAGNOSIS — Z79899 Other long term (current) drug therapy: Secondary | ICD-10-CM | POA: Diagnosis not present

## 2019-07-08 DIAGNOSIS — F17200 Nicotine dependence, unspecified, uncomplicated: Secondary | ICD-10-CM | POA: Diagnosis not present

## 2019-07-08 DIAGNOSIS — M545 Low back pain: Secondary | ICD-10-CM | POA: Diagnosis not present

## 2019-07-31 ENCOUNTER — Other Ambulatory Visit: Payer: Self-pay | Admitting: Family Medicine

## 2019-07-31 ENCOUNTER — Telehealth: Payer: Self-pay

## 2019-07-31 NOTE — Telephone Encounter (Signed)
Patient has been scheduled. Nothing further needed.  

## 2019-07-31 NOTE — Telephone Encounter (Signed)
Copied from [place] [id number]. Topic: Appointment Scheduling - Scheduling Inquiry for Clinic >> Jul 31, 2019  4:28 PM [name] wrote: Reason for CRM:  Pt wanting to be seen tomorrow for depression.  Can't get through to office, per [facility] send CRM and someone will call pt.

## 2019-08-02 ENCOUNTER — Telehealth (INDEPENDENT_AMBULATORY_CARE_PROVIDER_SITE_OTHER): Payer: BC Managed Care – PPO | Admitting: Family Medicine

## 2019-08-02 ENCOUNTER — Other Ambulatory Visit: Payer: Self-pay

## 2019-08-02 DIAGNOSIS — F418 Other specified anxiety disorders: Secondary | ICD-10-CM | POA: Diagnosis not present

## 2019-08-02 MED ORDER — FLUOXETINE HCL 20 MG PO TABS: 20.0000 mg | ORAL_TABLET | Freq: Every day | ORAL | 5 refills | Status: DC

## 2019-08-02 NOTE — Progress Notes (Signed)
This visit type was conducted due to national recommendations for restrictions regarding the COVID-19 pandemic in an effort to limit this patient's exposure and mitigate transmission in our community.   Virtual Visit via Video Note  I connected with [name] on 08/02/19 at 10:45 AM EDT by a video enabled telemedicine application and verified that I am speaking with the correct person using two identifiers.  Location patient: home Location provider:work or home office Persons participating in the virtual visit: patient, provider  I discussed the limitations of evaluation and management by telemedicine and the availability of in person appointments. The patient expressed understanding and agreed to proceed.   HPI: [name] called with increased depression and anxiety symptoms.  She has had a long history of anxiety.  She had taken at one point Xanax about 25 years.  Has been off this for over a year now.  She had some recent difficulties with sleep.  She tried Unisom without much improvement.  She has tried melatonin in the past without relief.  She has some decreased motivation.  Some loss of interest in activities.  She states she is frequently feeling "on edge ".  No suicidal ideation.  We had recently tried BuSpar for her anxiety symptoms and she did not see any improvement at all.  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  Her current client has had recent decline in status.  She denies any other specific stressors.  She is helping care for her 60 year old granddaughter.   ROS: See pertinent positives and negatives per HPI.  Past Medical History:  Diagnosis Date  . ASTHMA UNSPECIFIED WITH EXACERBATION 07/21/2010  . CARPAL TUNNEL SYNDROME, BILATERAL 01/16/2008  . COPD 05/08/2008  . GERD 04/18/2007  . HELICOBACTER  PYLORI GASTRITIS, HX OF 04/18/2007  . PANIC DISORDER 04/18/2007  . TOBACCO ABUSE 07/17/2009    Past Surgical History:  Procedure Laterality Date  . APPENDECTOMY    . OOPHORECTOMY      Family History  Problem Relation Age of Onset  . Diabetes Mother   . Cancer Father        lung smoke   . Heart disease Father   . Clotting disorder Brother   . Breast cancer Paternal Aunt   . Stomach cancer Paternal Uncle   . Esophageal cancer Neg Hx   . Colon cancer Neg Hx     SOCIAL HX: Ongoing nicotine use   Current Outpatient Medications:  .  albuterol (VENTOLIN HFA) 108 (90 Base) MCG/ACT inhaler, INHALE 2 PUFFS INTO LUNGS EVERY 4 HOURS AS NEEDED FOR WHEEZING OR SHORTNESS OF BREATH, Disp: 8 g, Rfl: 2 .  atorvastatin (LIPITOR) 40 MG tablet, Take 1 tablet (40 mg total) by mouth daily., Disp: 90 tablet, Rfl: 1 .  dicyclomine (BENTYL) 10 MG capsule, TAKE 1 CAPSULE (10 MG TOTAL) BY MOUTH 2 (TWO) TIMES DAILY AS NEEDED FOR SPASMS., Disp: 60 capsule, Rfl: 2 .  FLUoxetine (PROZAC) 20 MG tablet, Take 1 tablet (20 mg total) by mouth daily., Disp: 30 tablet, Rfl: 5 .  hyoscyamine (LEVSIN) 0.125 MG tablet, Take 1 tablet (0.125 mg total) by mouth daily. (Patient taking differently: Take 0.125 mg by mouth every 6 (six) hours as needed for cramping. ), Disp: 30 tablet, Rfl: 2 .  omeprazole (PRILOSEC) 20 MG capsule, TAKE 1 CAPSULE BY MOUTH EVERY DAY,  Disp: 90 capsule, Rfl: 2 .  SUMAtriptan (IMITREX) 50 MG tablet, TAKE 1 TABLET AS NEEDED FOR MIGRAINE MAY REPEAT IN 2 HRS IF HEADACHE PERSISTS (Patient taking differently: Take 50 mg by mouth every 2 (two) hours as needed for migraine. ), Disp: 10 tablet, Rfl: 0 .  SYMBICORT 80-4.5 MCG/ACT inhaler, TAKE 2 PUFFS BY MOUTH TWICE A DAY (Patient taking differently: Inhale 2 puffs into the lungs 2 (two) times daily as needed (SOB). ), Disp: 30.6 Inhaler, Rfl: 1 .  tiotropium (SPIRIVA HANDIHALER) 18 MCG inhalation capsule, Place 1 capsule (18 mcg total) into inhaler and inhale  daily., Disp: 30 capsule, Rfl: 12  EXAM:  VITALS per patient if applicable:  GENERAL: alert, oriented, appears well and in no acute distress  HEENT: atraumatic, conjunttiva clear, no obvious abnormalities on inspection of external nose and ears  NECK: normal movements of the head and neck  LUNGS: on inspection no signs of respiratory distress, breathing rate appears normal, no obvious gross SOB, gasping or wheezing  CV: no obvious cyanosis  MS: moves all visible extremities without noticeable abnormality  PSYCH/NEURO: pleasant and cooperative, no obvious depression or anxiety, speech and thought processing grossly intact  ASSESSMENT AND PLAN:  Discussed the following assessment and plan:  Mixed anxiety and depression symptoms  -Recommend trial of Prozac 20 mg once daily. -Recommend follow-up in 3 to 4 weeks to reassess -Consider counseling -Follow-up immediately for any worsening depression symptoms -Avoid benzodiazepines    I discussed the assessment and treatment plan with the patient. The patient was provided an opportunity to ask questions and all were answered. The patient agreed with the plan and demonstrated an understanding of the instructions.   The patient was advised to call back or seek an in-person evaluation if the symptoms worsen or if the condition fails to improve as anticipated.    Carolann Littler, MD

## 2019-08-06 DIAGNOSIS — Z79899 Other long term (current) drug therapy: Secondary | ICD-10-CM | POA: Diagnosis not present

## 2019-08-06 DIAGNOSIS — F17200 Nicotine dependence, unspecified, uncomplicated: Secondary | ICD-10-CM | POA: Diagnosis not present

## 2019-08-06 DIAGNOSIS — M545 Low back pain: Secondary | ICD-10-CM | POA: Diagnosis not present

## 2019-08-06 NOTE — Telephone Encounter (Signed)
The original prescription was discontinued on 08/02/2019 by Eulas Post, MD. Renewing this prescription may not be appropriate.

## 2019-08-24 ENCOUNTER — Other Ambulatory Visit: Payer: Self-pay | Admitting: Family Medicine

## 2019-09-03 DIAGNOSIS — M545 Low back pain: Secondary | ICD-10-CM | POA: Diagnosis not present

## 2019-09-03 DIAGNOSIS — F17200 Nicotine dependence, unspecified, uncomplicated: Secondary | ICD-10-CM | POA: Diagnosis not present

## 2019-09-03 DIAGNOSIS — Z79899 Other long term (current) drug therapy: Secondary | ICD-10-CM | POA: Diagnosis not present

## 2019-10-01 DIAGNOSIS — M545 Low back pain: Secondary | ICD-10-CM | POA: Diagnosis not present

## 2019-10-01 DIAGNOSIS — Z79899 Other long term (current) drug therapy: Secondary | ICD-10-CM | POA: Diagnosis not present

## 2019-10-29 DIAGNOSIS — F17200 Nicotine dependence, unspecified, uncomplicated: Secondary | ICD-10-CM | POA: Diagnosis not present

## 2019-10-29 DIAGNOSIS — M545 Low back pain: Secondary | ICD-10-CM | POA: Diagnosis not present

## 2019-10-29 DIAGNOSIS — Z79899 Other long term (current) drug therapy: Secondary | ICD-10-CM | POA: Diagnosis not present

## 2019-11-26 DIAGNOSIS — M545 Low back pain: Secondary | ICD-10-CM | POA: Diagnosis not present

## 2020-02-13 ENCOUNTER — Other Ambulatory Visit: Payer: Self-pay | Admitting: Family Medicine

## 2020-02-13 DIAGNOSIS — Z1231 Encounter for screening mammogram for malignant neoplasm of breast: Secondary | ICD-10-CM

## 2020-03-23 ENCOUNTER — Other Ambulatory Visit: Payer: Self-pay | Admitting: Family Medicine

## 2020-03-23 DIAGNOSIS — Z79899 Other long term (current) drug therapy: Secondary | ICD-10-CM | POA: Diagnosis not present

## 2020-03-23 DIAGNOSIS — M545 Low back pain: Secondary | ICD-10-CM | POA: Diagnosis not present

## 2020-03-23 DIAGNOSIS — F17200 Nicotine dependence, unspecified, uncomplicated: Secondary | ICD-10-CM | POA: Diagnosis not present

## 2020-04-22 ENCOUNTER — Encounter: Payer: Self-pay | Admitting: Family Medicine

## 2020-04-27 ENCOUNTER — Encounter: Payer: Self-pay | Admitting: Family Medicine

## 2020-04-29 ENCOUNTER — Ambulatory Visit (INDEPENDENT_AMBULATORY_CARE_PROVIDER_SITE_OTHER): Payer: BC Managed Care – PPO | Admitting: Family Medicine

## 2020-04-29 ENCOUNTER — Encounter: Payer: Self-pay | Admitting: Family Medicine

## 2020-04-29 ENCOUNTER — Other Ambulatory Visit: Payer: Self-pay

## 2020-04-29 VITALS — BP 118/64 | HR 87 | Temp 98.2°F | Ht 67.0 in | Wt 116.1 lb

## 2020-04-29 DIAGNOSIS — R634 Abnormal weight loss: Secondary | ICD-10-CM | POA: Diagnosis not present

## 2020-04-29 DIAGNOSIS — Z0000 Encounter for general adult medical examination without abnormal findings: Secondary | ICD-10-CM

## 2020-04-29 NOTE — Progress Notes (Signed)
Established Patient Office Visit  Subjective:  Patient ID: [name], female    DOB: 10-11-1960  Age: 59 y.o. MRN: [id number]  CC:  Chief Complaint  Patient presents with  . Annual Exam    Pt wants to discuss weight loss    HPI [name] presents for physical exam.  She has lost a substantial amount of weight since last visit.  Her weight back in January 2020 was 138 pounds down to 116 today.  She states she has given up sweet tea and also no alcohol and she wonders if some of the weight loss may be due to reduction in calories from those.  She states her appetite is fairly good.  She had been drinking up to 7 beers/day prior to her weight loss.   She does have a longstanding history of nicotine use.  She has not had any recent cough.  No headaches, sore throat, dysphagia, abdominal pain, any change in stool habits.  She is overdue for repeat mammogram.  She not had Pap smear in a few years.  She has seen GYN in the past.  Colonoscopy 2015.  She had EGD 8/19 which was unremarkable.  Other health maintenance reviewed.  No history of shingles vaccine.  Past Medical History:  Diagnosis Date  . ASTHMA UNSPECIFIED WITH EXACERBATION 07/21/2010  . CARPAL TUNNEL SYNDROME, BILATERAL 01/16/2008  . COPD 05/08/2008  . GERD 04/18/2007  . HELICOBACTER PYLORI GASTRITIS, HX OF 04/18/2007  . PANIC DISORDER 04/18/2007  . TOBACCO ABUSE 07/17/2009    Past Surgical History:  Procedure Laterality Date  . APPENDECTOMY    . OOPHORECTOMY      Family History  Problem Relation Age of Onset  . Diabetes Mother   . Cancer Father        lung smoke   . Heart disease Father   . Clotting disorder Brother   . Breast cancer Paternal Aunt   . Stomach cancer Paternal Uncle   . Esophageal cancer Neg Hx   . Colon cancer Neg Hx     Social History   Socioeconomic History  . Marital status: Divorced    Spouse name: Not on file  . Number of children: 1  . Years of education: Not on file  . Highest  education level: Not on file  Occupational History  . Occupation: Forensic psychologist: SELF-EMPLOYED  Tobacco Use  . Smoking status: Current Every Day Smoker    Packs/day: 1.00    Years: 50.00    Pack years: 50.00    Types: Cigarettes  . Smokeless tobacco: Never Used  . Tobacco comment: Trying nicotine patches, made her nauseous at first, going to cut patches in half.  Vaping Use  . Vaping Use: Never used  Substance and Sexual Activity  . Alcohol use: No  . Drug use: No  . Sexual activity: Not on file  Other Topics Concern  . Not on file  Social History Narrative   lives with bf    [facility] cares of elderly person   Self employed    Long term smoker   Social Determinants of Radio broadcast assistant Strain:   . Difficulty of Paying Living Expenses:   Food Insecurity:   . Worried About Charity fundraiser in the Last Year:   . Arboriculturist in the Last Year:   Transportation Needs:   . Film/video editor (Medical):   Marland Kitchen Lack of Transportation (Non-Medical):  Physical Activity:   . Days of Exercise per Week:   . Minutes of Exercise per Session:   Stress:   . Feeling of Stress :   Social Connections:   . Frequency of Communication with Friends and Family:   . Frequency of Social Gatherings with Friends and Family:   . Attends Religious Services:   . Active Member of Clubs or Organizations:   . Attends Archivist Meetings:   Marland Kitchen Marital Status:   Intimate Partner Violence:   . Fear of Current or Ex-Partner:   . Emotionally Abused:   Marland Kitchen Physically Abused:   . Sexually Abused:     Outpatient Medications Prior to Visit  Medication Sig Dispense Refill  . albuterol (VENTOLIN HFA) 108 (90 Base) MCG/ACT inhaler INHALE 2 PUFFS INTO LUNGS EVERY 4 HOURS AS NEEDED FOR WHEEZING OR SHORTNESS OF BREATH 8 g 2  . atorvastatin (LIPITOR) 40 MG tablet Take 1 tablet (40 mg total) by mouth daily. 90 tablet 1  . dicyclomine (BENTYL) 10 MG capsule TAKE 1 CAPSULE (10 MG TOTAL) BY  MOUTH 2 (TWO) TIMES DAILY AS NEEDED FOR SPASMS. 60 capsule 2  . FLUoxetine (PROZAC) 20 MG tablet TAKE 1 TABLET BY MOUTH EVERY DAY 90 tablet 1  . hyoscyamine (LEVSIN) 0.125 MG tablet Take 1 tablet (0.125 mg total) by mouth daily. (Patient taking differently: Take 0.125 mg by mouth every 6 (six) hours as needed for cramping. ) 30 tablet 2  . omeprazole (PRILOSEC) 20 MG capsule TAKE 1 CAPSULE BY MOUTH EVERY DAY 90 capsule 2  . SUMAtriptan (IMITREX) 50 MG tablet TAKE 1 TABLET AS NEEDED FOR MIGRAINE MAY REPEAT IN 2 HRS IF HEADACHE PERSISTS (Patient taking differently: Take 50 mg by mouth every 2 (two) hours as needed for migraine. ) 10 tablet 0  . SYMBICORT 80-4.5 MCG/ACT inhaler TAKE 2 PUFFS BY MOUTH TWICE A DAY (Patient taking differently: Inhale 2 puffs into the lungs 2 (two) times daily as needed (SOB). ) 30.6 Inhaler 1  . tiotropium (SPIRIVA HANDIHALER) 18 MCG inhalation capsule Place 1 capsule (18 mcg total) into inhaler and inhale daily. 30 capsule 12   No facility-administered medications prior to visit.    Allergies  Allergen Reactions  . Aspirin     REACTION: had h. pylori due to too many goody powders. told not to take any more asa    ROS Review of Systems  Constitutional: Positive for unexpected weight change. Negative for appetite change, chills and fever.  Respiratory: Negative for cough and shortness of breath.   Cardiovascular: Negative for chest pain, palpitations and leg swelling.  Gastrointestinal: Negative for abdominal pain, blood in stool, diarrhea, nausea and vomiting.  Genitourinary: Negative for dysuria and vaginal bleeding.  Skin: Negative for rash.  Neurological: Negative for dizziness and headaches.  Hematological: Negative for adenopathy.      Objective:    Physical Exam Vitals reviewed.  Constitutional:      Comments: Thin 59 year old female in no distress  HENT:     Head: Normocephalic and atraumatic.     Mouth/Throat:     Pharynx: Oropharynx is clear.  No oropharyngeal exudate or posterior oropharyngeal erythema.  Cardiovascular:     Rate and Rhythm: Normal rate and regular rhythm.  Pulmonary:     Effort: Pulmonary effort is normal.     Breath sounds: Normal breath sounds.     Comments: Breasts are symmetric with no mass palpated Abdominal:     Palpations: Abdomen is soft. There is  no mass.     Tenderness: There is no abdominal tenderness.  Musculoskeletal:     Cervical back: Neck supple.     Right lower leg: No edema.     Left lower leg: No edema.  Lymphadenopathy:     Cervical: No cervical adenopathy.  Skin:    Findings: No rash.  Neurological:     General: No focal deficit present.  Psychiatric:        Mood and Affect: Mood normal.        Thought Content: Thought content normal.     BP 118/64 (BP Location: Left Arm, Patient Position: Sitting, Cuff Size: Normal)   Pulse 87   Temp 98.2 F (36.8 C) (Oral)   Ht 5\' 7"  (1.702 m)   Wt 116 lb 1.6 oz (52.7 kg)   SpO2 97%   BMI 18.18 kg/m  Wt Readings from Last 3 Encounters:  04/29/20 116 lb 1.6 oz (52.7 kg)  06/19/19 140 lb (63.5 kg)  06/10/19 139 lb 8 oz (63.3 kg)     Health Maintenance Due  Topic Date Due  . COVID-19 Vaccine (1) Never done  . TETANUS/TDAP  Never done  . PAP SMEAR-Modifier  08/24/2011  . MAMMOGRAM  10/18/2015    There are no preventive care reminders to display for this patient.  Lab Results  Component Value Date   TSH 1.07 07/31/2018   Lab Results  Component Value Date   WBC 12.0 (H) 06/10/2019   HGB 13.9 06/10/2019   HCT 42.1 06/10/2019   MCV 98.1 06/10/2019   PLT 195 06/10/2019   Lab Results  Component Value Date   NA 136 06/21/2019   K 4.0 06/21/2019   CO2 28 06/21/2019   GLUCOSE 103 (H) 06/21/2019   BUN 14 06/21/2019   CREATININE 0.85 06/21/2019   BILITOT 0.3 06/21/2019   ALKPHOS 77 06/10/2019   AST 16 06/21/2019   ALT 20 06/21/2019   PROT 6.8 06/21/2019   ALBUMIN 3.4 (L) 06/10/2019   CALCIUM 9.2 06/21/2019   ANIONGAP  12 06/10/2019   GFR 84.03 07/31/2018   Lab Results  Component Value Date   CHOL 256 (H) 07/31/2018   Lab Results  Component Value Date   HDL 64.10 07/31/2018   Lab Results  Component Value Date   LDLCALC 7 02/10/2014   Lab Results  Component Value Date   TRIG 219.0 (H) 07/31/2018   Lab Results  Component Value Date   CHOLHDL 4 07/31/2018   Lab Results  Component Value Date   HGBA1C 6.0 04/07/2017      Assessment & Plan:   Physical exam.  Patient has had fairly substantial weight loss of about 22 pounds over the past few months.  She relates cutting out alcohol and sweet tea states and that she is very "fidgety" and active and she attributes the weight loss to that.  She does not seem to have any major depression issues though she still grieves the loss of her husband from few years ago  -Set up mammogram -Check screening labs -Her colonoscopy is up-to-date -PA and lateral chest x-ray -Consider Shingrix vaccine -She will schedule follow-up with her GYN for Pap smear -discussed healthy calorie supplement options. -recommend one month follow up to re-assess weight.    No orders of the defined types were placed in this encounter.   Follow-up: No follow-ups on file.    Carolann Littler, MD

## 2020-04-29 NOTE — Patient Instructions (Signed)
Set up repeat mammogram  Consider follow-up with GYN to get follow-up Pap smear  I will put in referral to set up low-dose CT lung cancer screening  We will call with lab work  Set up 1 month follow-up to reassess weight  Consider shingles vaccine

## 2020-04-30 LAB — CBC WITH DIFFERENTIAL/PLATELET
Absolute Monocytes: 354 cells/uL (ref 200–950)
Basophils Absolute: 31 cells/uL (ref 0–200)
Basophils Relative: 0.6 %
Eosinophils Absolute: 62 cells/uL (ref 15–500)
Eosinophils Relative: 1.2 %
HCT: 39.8 % (ref 35.0–45.0)
Hemoglobin: 13.4 g/dL (ref 11.7–15.5)
Lymphs Abs: 1898 cells/uL (ref 850–3900)
MCH: 32.2 pg (ref 27.0–33.0)
MCHC: 33.7 g/dL (ref 32.0–36.0)
MCV: 95.7 fL (ref 80.0–100.0)
MPV: 9.3 fL (ref 7.5–12.5)
Monocytes Relative: 6.8 %
Neutro Abs: 2855 cells/uL (ref 1500–7800)
Neutrophils Relative %: 54.9 %
Platelets: 232 10*3/uL (ref 140–400)
RBC: 4.16 10*6/uL (ref 3.80–5.10)
RDW: 12.9 % (ref 11.0–15.0)
Total Lymphocyte: 36.5 %
WBC: 5.2 10*3/uL (ref 3.8–10.8)

## 2020-04-30 LAB — BASIC METABOLIC PANEL
BUN: 16 mg/dL (ref 7–25)
CO2: 29 mmol/L (ref 20–32)
Calcium: 9.3 mg/dL (ref 8.6–10.4)
Chloride: 103 mmol/L (ref 98–110)
Creat: 0.74 mg/dL (ref 0.50–0.99)
Glucose, Bld: 98 mg/dL (ref 65–99)
Potassium: 4.4 mmol/L (ref 3.5–5.3)
Sodium: 138 mmol/L (ref 135–146)

## 2020-04-30 LAB — LIPID PANEL
Cholesterol: 219 mg/dL — ABNORMAL HIGH (ref <200)
HDL: 77 mg/dL (ref >=50)
LDL Cholesterol (Calc): 125 mg/dL (calc) — ABNORMAL HIGH
Non-HDL Cholesterol (Calc): 142 mg/dL (calc) — ABNORMAL HIGH (ref <130)
Total CHOL/HDL Ratio: 2.8 (calc) (ref <5.0)
Triglycerides: 80 mg/dL (ref <150)

## 2020-04-30 LAB — HEPATIC FUNCTION PANEL
AG Ratio: 1.4 (calc) (ref 1.0–2.5)
ALT: 12 U/L (ref 6–29)
AST: 16 U/L (ref 10–35)
Albumin: 3.8 g/dL (ref 3.6–5.1)
Alkaline phosphatase (APISO): 76 U/L (ref 37–153)
Bilirubin, Direct: 0.1 mg/dL (ref 0.0–0.2)
Globulin: 2.8 g/dL (calc) (ref 1.9–3.7)
Indirect Bilirubin: 0.4 mg/dL (calc) (ref 0.2–1.2)
Total Bilirubin: 0.5 mg/dL (ref 0.2–1.2)
Total Protein: 6.6 g/dL (ref 6.1–8.1)

## 2020-04-30 LAB — HIV ANTIBODY (ROUTINE TESTING W REFLEX): HIV 1&2 Ab, 4th Generation: NONREACTIVE

## 2020-04-30 LAB — TSH: TSH: 1.19 mIU/L (ref 0.40–4.50)

## 2020-04-30 LAB — SEDIMENTATION RATE: Sed Rate: 11 mm/h (ref 0–30)

## 2020-05-01 ENCOUNTER — Other Ambulatory Visit: Payer: Self-pay | Admitting: Family Medicine

## 2020-07-14 ENCOUNTER — Ambulatory Visit: Payer: Self-pay | Admitting: Family Medicine

## 2020-07-15 ENCOUNTER — Ambulatory Visit (INDEPENDENT_AMBULATORY_CARE_PROVIDER_SITE_OTHER): Payer: Self-pay | Admitting: Family Medicine

## 2020-07-15 ENCOUNTER — Encounter: Payer: Self-pay | Admitting: Family Medicine

## 2020-07-15 ENCOUNTER — Other Ambulatory Visit: Payer: Self-pay

## 2020-07-15 VITALS — BP 124/76 | HR 97 | Temp 97.4°F | Resp 16 | Ht 67.0 in | Wt 111.1 lb

## 2020-07-15 DIAGNOSIS — R300 Dysuria: Secondary | ICD-10-CM

## 2020-07-15 DIAGNOSIS — M21371 Foot drop, right foot: Secondary | ICD-10-CM

## 2020-07-15 LAB — POCT URINALYSIS DIPSTICK
Bilirubin, UA: NEGATIVE
Blood, UA: NEGATIVE
Glucose, UA: NEGATIVE
Ketones, UA: NEGATIVE
Nitrite, UA: NEGATIVE
Protein, UA: POSITIVE — AB
Spec Grav, UA: >=1.03 — AB (ref 1.010–1.025)
Urobilinogen, UA: 0.2 E.U./dL
pH, UA: 5.5 (ref 5.0–8.0)

## 2020-07-15 MED ORDER — CEPHALEXIN 500 MG PO CAPS
500.0000 mg | ORAL_CAPSULE | Freq: Three times a day (TID) | ORAL | 0 refills | Status: DC
Start: 2020-07-15 — End: 2021-04-16

## 2020-07-15 NOTE — Patient Instructions (Signed)
Urinary Tract Infection, Adult  A urinary tract infection (UTI) is an infection of any part of the urinary tract. The urinary tract includes the kidneys, ureters, bladder, and urethra. These organs make, store, and get rid of urine in the body. Your health care provider may use other names to describe the infection. An upper UTI affects the ureters and kidneys (pyelonephritis). A lower UTI affects the bladder (cystitis) and urethra (urethritis). What are the causes? Most urinary tract infections are caused by bacteria in your genital area, around the entrance to your urinary tract (urethra). These bacteria grow and cause inflammation of your urinary tract. What increases the risk? You are more likely to develop this condition if:  You have a urinary catheter that stays in place (indwelling).  You are not able to control when you urinate or have a bowel movement (you have incontinence).  You are female and you: ? Use a spermicide or diaphragm for birth control. ? Have low estrogen levels. ? Are pregnant.  You have certain genes that increase your risk (genetics).  You are sexually active.  You take antibiotic medicines.  You have a condition that causes your flow of urine to slow down, such as: ? An enlarged prostate, if you are female. ? Blockage in your urethra (stricture). ? A kidney stone. ? A nerve condition that affects your bladder control (neurogenic bladder). ? Not getting enough to drink, or not urinating often.  You have certain medical conditions, such as: ? Diabetes. ? A weak disease-fighting system (immunesystem). ? Sickle cell disease. ? Gout. ? Spinal cord injury. What are the signs or symptoms? Symptoms of this condition include:  Needing to urinate right away (urgently).  Frequent urination or passing small amounts of urine frequently.  Pain or burning with urination.  Blood in the urine.  Urine that smells bad or unusual.  Trouble urinating.  Cloudy  urine.  Vaginal discharge, if you are female.  Pain in the abdomen or the lower back. You may also have:  Vomiting or a decreased appetite.  Confusion.  Irritability or tiredness.  A fever.  Diarrhea. The first symptom in older adults may be confusion. In some cases, they may not have any symptoms until the infection has worsened. How is this diagnosed? This condition is diagnosed based on your medical history and a physical exam. You may also have other tests, including:  Urine tests.  Blood tests.  Tests for sexually transmitted infections (STIs). If you have had more than one UTI, a cystoscopy or imaging studies may be done to determine the cause of the infections. How is this treated? Treatment for this condition includes:  Antibiotic medicine.  Over-the-counter medicines to treat discomfort.  Drinking enough water to stay hydrated. If you have frequent infections or have other conditions such as a kidney stone, you may need to see a health care provider who specializes in the urinary tract (urologist). In rare cases, urinary tract infections can cause sepsis. Sepsis is a life-threatening condition that occurs when the body responds to an infection. Sepsis is treated in the hospital with IV antibiotics, fluids, and other medicines. Follow these instructions at home:  Medicines  Take over-the-counter and prescription medicines only as told by your health care provider.  If you were prescribed an antibiotic medicine, take it as told by your health care provider. Do not stop using the antibiotic even if you start to feel better. General instructions  Make sure you: ? Empty your bladder often and   completely. Do not hold urine for long periods of time. ? Empty your bladder after sex. ? Wipe from front to back after a bowel movement if you are female. Use each tissue one time when you wipe.  Drink enough fluid to keep your urine pale yellow.  Keep all follow-up  visits as told by your health care provider. This is important. Contact a health care provider if:  Your symptoms do not get better after 1-2 days.  Your symptoms go away and then return. Get help right away if you have:  Severe pain in your back or your lower abdomen.  A fever.  Nausea or vomiting. Summary  A urinary tract infection (UTI) is an infection of any part of the urinary tract, which includes the kidneys, ureters, bladder, and urethra.  Most urinary tract infections are caused by bacteria in your genital area, around the entrance to your urinary tract (urethra).  Treatment for this condition often includes antibiotic medicines.  If you were prescribed an antibiotic medicine, take it as told by your health care provider. Do not stop using the antibiotic even if you start to feel better.  Keep all follow-up visits as told by your health care provider. This is important. This information is not intended to replace advice given to you by your health care provider. Make sure you discuss any questions you have with your health care provider. Document Revised: 09/13/2018 Document Reviewed: 04/05/2018 Elsevier Patient Education  [place]. Common Peroneal Nerve Entrapment  Common peroneal nerve entrapment is a condition that can make it hard to lift a foot. The condition results from pressure on a nerve in the lower leg called the common peroneal nerve. Your common peroneal nerve provides feeling to your outer lower leg and foot. It also supplies the muscles that move your foot and toes upward and outward. What are the causes? This condition may be caused by:  Sitting cross-legged, squatting, or kneeling for long periods of time.  A hard, direct hit to the side of the lower leg.  Swelling from a knee injury.  A break (fracture) in one of the lower leg bones.  Wearing a boot or cast that ends just below the knee.  A growth or cyst near the nerve. What increases  the risk? This condition is more likely to develop in people who play:  Contact sports, such as football or hockey.  Sports where you wear high and stiff boots, such as skiing. What are the signs or symptoms? Symptoms of this condition include:  Trouble lifting your foot up (foot drop).  Tripping often.  Your foot hitting the ground harder than normal as you walk.  Numbness, tingling, or pain in the outside of the knee, outside of the lower leg, and top of the foot.  Sensitivity to pressure on the front or side of the leg. How is this diagnosed? This condition may be diagnosed based on:  Your symptoms.  Your medical history.  A physical exam.  Tests, such as: ? An X-ray to check the bones of your knee and leg. ? MRI to check tendons that attach to the side of your knee. ? An ultrasound to check for a growth or cyst. ? An electromyogram (EMG) to check your nerves. During your physical exam, your health care provider will check for numbness in your leg and test the strength of your lower leg muscles. He or she may tap the side of your lower leg to see if that  causes tingling. How is this treated? Treatment for this condition may include:  Avoiding activities that make symptoms worse.  Using a brace to hold up your foot and toes.  Taking anti-inflammatory pain medicines to relieve swelling and lessen pain.  Having medicines injected into your ankle joint to lessen pain and swelling.  Doing exercises to help you regain or maintain movement (physical therapy).  Surgery to take pressure off the nerve. This may be needed if there is no improvement after 2-3 months or if there is a growth pushing on the nerve.  Returning gradually to full activity. Follow these instructions at home: If you have a brace:  Wear it as told by your health care provider. Remove it only as told by your health care provider.  Loosen the brace if your toes tingle, become numb, or turn cold and  blue.  Keep the brace clean.  If the brace is not waterproof: ? Do not let it get wet. ? Cover it with a watertight covering when you take a bath or a shower.  Ask your health care provider when it is safe to drive with a brace on your foot. Activity  Return to your normal activities as told by your health care provider. Ask your health care provider what activities are safe for you.  Do not do any activities that make pain or swelling worse.  Do exercises as told by your health care provider. General instructions  Take over-the-counter and prescription medicines only as told by your health care provider.  Do not put your full weight on your knee until your health care provider says you can. Use crutches as directed by your health care provider.  Keep all follow-up visits as told by your health care provider. This is important. How is this prevented?  Wear supportive footwear that is appropriate for your athletic activity.  Avoid athletic activities that cause ankle pain or swelling.  Wear protective padding over your lower legs when playing contact sports.  Make sure your boots do not put extra pressure on the area just below your knees.  Do not sit cross-legged for long periods of time. Contact a health care provider if:  Your symptoms do not get better in 2-3 months.  The weakness or numbness in your leg or foot gets worse. Summary  Common peroneal nerve entrapment is a condition that results from pressure on a nerve in the lower leg called the common peroneal nerve.  This condition may be caused by a hard hit, swelling, a fracture, or a cyst in the lower leg.  Treatment may include rest, a brace, medicines, and physical therapy. Sometimes surgery is needed.  Do not do any activities that make pain or swelling worse. This information is not intended to replace advice given to you by your health care provider. Make sure you discuss any questions you have with your  health care provider. Document Revised: 08/06/2018 Document Reviewed: 08/06/2018 Elsevier Patient Education  2020 [employer].

## 2020-07-15 NOTE — Addendum Note (Signed)
Addended by: Marrion Coy on: 07/15/2020 02:14 PM   Modules accepted: Orders
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
## 2020-07-16 LAB — URINE CULTURE
MICRO NUMBER:: 11039015
SPECIMEN QUALITY:: ADEQUATE

## 2020-10-27 ENCOUNTER — Other Ambulatory Visit: Payer: Self-pay | Admitting: Family Medicine

## 2020-11-02 ENCOUNTER — Telehealth: Payer: Self-pay | Admitting: Family Medicine

## 2020-11-02 ENCOUNTER — Telehealth (INDEPENDENT_AMBULATORY_CARE_PROVIDER_SITE_OTHER): Payer: Self-pay | Admitting: Family Medicine

## 2020-11-02 DIAGNOSIS — F418 Other specified anxiety disorders: Secondary | ICD-10-CM

## 2020-11-02 DIAGNOSIS — U071 COVID-19: Secondary | ICD-10-CM
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
## 2020-11-20 ENCOUNTER — Other Ambulatory Visit: Payer: Self-pay

## 2020-11-20 ENCOUNTER — Ambulatory Visit (INDEPENDENT_AMBULATORY_CARE_PROVIDER_SITE_OTHER): Payer: Self-pay | Admitting: Family Medicine

## 2020-11-20 ENCOUNTER — Encounter: Payer: Self-pay | Admitting: Family Medicine

## 2020-11-20 VITALS — BP 110/70 | HR 94 | Temp 98.2°F | Resp 16 | Ht 67.0 in | Wt 121.5 lb

## 2020-11-20 DIAGNOSIS — H60501 Unspecified acute noninfective otitis externa, right ear: Secondary | ICD-10-CM

## 2020-11-20 MED ORDER — CIPROFLOXACIN-DEXAMETHASONE 0.3-0.1 % OT SUSP: 4.0000 [drp] | Freq: Two times a day (BID) | OTIC | 0 refills | Status: AC
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
## 2020-11-20 NOTE — Patient Instructions (Signed)
A few things to remember from today's visit:  Acute otitis externa of right ear, unspecified type - Plan: ciprofloxacin-dexamethasone (CIPRODEX) OTIC suspension   Otitis Externa  Otitis externa is an infection of the outer ear canal. The outer ear canal is the area between the outside of the ear and the eardrum. Otitis externa is sometimes called swimmer's ear. What are the causes? Common causes of this condition include:  Swimming in dirty water.  Moisture in the ear.  An injury to the inside of the ear.  An object stuck in the ear.  A cut or scrape on the outside of the ear. What increases the risk? You are more likely to get this condition if you go swimming often. What are the signs or symptoms?  Itching in the ear. This is often the first symptom.  Swelling of the ear.  Redness in the ear.  Ear pain. The pain may get worse when you pull on your ear.  Pus coming from the ear. How is this treated? This condition may be treated with:  Antibiotic ear drops. These are often given for 10-14 days.  Medicines to reduce itching and swelling. Follow these instructions at home:  If you were given antibiotic ear drops, use them as told by your doctor. Do not stop using them even if your condition gets better.  Take over-the-counter and prescription medicines only as told by your doctor.  Avoid getting water in your ears as told by your doctor. You may be told to avoid swimming or water sports for a few days.  Keep all follow-up visits as told by your doctor. This is important. How is this prevented?  Keep your ears dry. Use the corner of a towel to dry your ears after you swim or bathe.  Try not to scratch or put things in your ear. Doing these things makes it easier for germs to grow in your ear.  Avoid swimming in lakes, dirty water, or pools that may not have the right amount of a chemical called chlorine. Contact a doctor if:  You have a fever.  Your ear is  still red, swollen, or painful after 3 days.  You still have pus coming from your ear after 3 days.  Your redness, swelling, or pain gets worse.  You have a really bad headache.  You have redness, swelling, pain, or tenderness behind your ear. Summary  Otitis externa is an infection of the outer ear canal.  Symptoms include pain, redness, and swelling of the ear.  If you were given antibiotic ear drops, use them as told by your doctor. Do not stop using them even if your condition gets better.  Try not to scratch or put things in your ear. This information is not intended to replace advice given to you by your health care provider. Make sure you discuss any questions you have with your health care provider. Document Revised: 03/02/2018 Document Reviewed: 03/02/2018 Elsevier Patient Education  2021 [place].  Please be sure medication list is accurate. If a new problem present, please set up appointment sooner than planned today.

## 2021-04-16 ENCOUNTER — Ambulatory Visit (INDEPENDENT_AMBULATORY_CARE_PROVIDER_SITE_OTHER): Payer: 59 | Admitting: Family Medicine

## 2021-04-16 ENCOUNTER — Other Ambulatory Visit: Payer: Self-pay

## 2021-04-16 VITALS — BP 130/70 | HR 95 | Temp 98.3°F | Wt 122.3 lb

## 2021-04-16 DIAGNOSIS — R238 Other skin changes: Secondary | ICD-10-CM

## 2021-04-16 DIAGNOSIS — K921 Melena: Secondary | ICD-10-CM

## 2021-04-16 LAB — CBC WITH DIFFERENTIAL/PLATELET
Basophils Absolute: 0 10*3/uL (ref 0.0–0.1)
Basophils Relative: 0.3 % (ref 0.0–3.0)
Eosinophils Absolute: 0.1 10*3/uL (ref 0.0–0.7)
Eosinophils Relative: 0.9 % (ref 0.0–5.0)
HCT: 37.4 % (ref 36.0–46.0)
Hemoglobin: 12.8 g/dL (ref 12.0–15.0)
Lymphocytes Relative: 22.7 % (ref 12.0–46.0)
Lymphs Abs: 1.4 10*3/uL (ref 0.7–4.0)
MCHC: 34.3 g/dL (ref 30.0–36.0)
MCV: 95.1 fl (ref 78.0–100.0)
Monocytes Absolute: 0.4 10*3/uL (ref 0.1–1.0)
Monocytes Relative: 5.8 % (ref 3.0–12.0)
Neutro Abs: 4.4 10*3/uL (ref 1.4–7.7)
Neutrophils Relative %: 70.3 % (ref 43.0–77.0)
Platelets: 280 10*3/uL (ref 150.0–400.0)
RBC: 3.93 Mil/uL (ref 3.87–5.11)
RDW: 14 % (ref 11.5–15.5)
WBC: 6.3 10*3/uL (ref 4.0–10.5)

## 2021-04-16 MED ORDER — VALACYCLOVIR HCL 1 G PO TABS: 1000.0000 mg | ORAL_TABLET | Freq: Three times a day (TID) | ORAL | 0 refills | Status: DC

## 2021-04-16 NOTE — Progress Notes (Signed)
Established Patient Office Visit  Subjective:  Patient ID: [name], female    DOB: Oct 02, 1961  Age: 60 y.o. MRN: [id number]  CC:  Chief Complaint  Patient presents with   lood in stool    X 3 weeks, notice some loose stools and blood, issue resolved itself, started again Saturday, blood is bright red. Pt states she does a lot of heavy lifting at work and has a history of stomach ulcers    HPI [name] presents for bright red blood per rectum with wiping 2 times over the past few weeks.  Most recently was last Sunday.  Denies any significant straining.  Has had a couple loose stools but not consistently.  No perianal pain.  She has been taking some aspirin recently.  Occasional fleeting lower abdominal pain but not consistently.  She had colonoscopy 2015 which showed some diverticulosis but no mention of internal hemorrhoids.  Her appetite has been stable.  Denies any known history of hemorrhoids.  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  Past Medical History:  Diagnosis Date   ASTHMA UNSPECIFIED WITH EXACERBATION 07/21/2010   CARPAL TUNNEL SYNDROME, BILATERAL 01/16/2008   COPD 05/08/2008   GERD 10/17/15   HELICOBACTER PYLORI GASTRITIS, HX OF 04/18/2007   PANIC DISORDER 04/18/2007   TOBACCO ABUSE 07/17/2009    Past Surgical History:  Procedure Laterality Date   APPENDECTOMY     OOPHORECTOMY      Family History  Problem Relation Age of Onset   Diabetes Mother    Cancer Father        lung smoke    Heart disease Father    Clotting disorder Brother    Breast cancer Paternal Aunt    Stomach cancer Paternal Uncle    Esophageal cancer Neg Hx    Colon cancer Neg Hx     Social History   Socioeconomic History   Marital status: Divorced    Spouse name: Not on file   Number of children: 1   Years of education: Not on file   Highest education level: Not on file  Occupational History    Occupation: CNA    Employer: SELF-EMPLOYED  Tobacco Use   Smoking status: Every Day    Packs/day: 1.00    Years: 50.00    Pack years: 50.00    Types: Cigarettes   Smokeless tobacco: Never   Tobacco comments:    Trying nicotine patches, made her nauseous at first, going to cut patches in half.  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    Long term smoker   Social Determinants of Radio broadcast assistant Strain: Not on file  Food Insecurity: Not on file  Transportation Needs: Not on file  Physical Activity: Not on file  Stress: Not on file  Social Connections: Not on file  Intimate Partner Violence: Not on file    Outpatient Medications Prior to Visit  Medication Sig Dispense Refill   albuterol (VENTOLIN HFA) 108 (90 Base) MCG/ACT inhaler INHALE 2 PUFFS INTO LUNGS EVERY 4 HOURS AS NEEDED FOR WHEEZING OR SHORTNESS  OF BREATH 8.5 each 1   atorvastatin (LIPITOR) 40 MG tablet Take 1 tablet (40 mg total) by mouth daily. 90 tablet 1   cephALEXin (KEFLEX) 500 MG capsule Take 1 capsule (500 mg total) by mouth 3 (three) times daily. 15 capsule 0   dicyclomine (BENTYL) 10 MG capsule TAKE 1 CAPSULE (10 MG TOTAL) BY MOUTH 2 (TWO) TIMES DAILY AS NEEDED FOR SPASMS. 60 capsule 2   FLUoxetine (PROZAC) 20 MG tablet TAKE 1 TABLET BY MOUTH EVERY DAY 90 tablet 1   hyoscyamine (LEVSIN) 0.125 MG tablet Take 1 tablet (0.125 mg total) by mouth daily. (Patient taking differently: Take 0.125 mg by mouth every 6 (six) hours as needed for cramping.) 30 tablet 2   omeprazole (PRILOSEC) 20 MG capsule TAKE 1 CAPSULE BY MOUTH EVERY DAY 90 capsule 1   SUMAtriptan (IMITREX) 50 MG tablet TAKE 1 TABLET AS NEEDED FOR MIGRAINE MAY REPEAT IN 2 HRS IF HEADACHE PERSISTS (Patient taking differently:  Take 50 mg by mouth every 2 (two) hours as needed for migraine.) 10 tablet 0   SYMBICORT 80-4.5 MCG/ACT inhaler TAKE 2 PUFFS BY MOUTH TWICE A DAY (Patient taking differently: Inhale 2 puffs into the lungs 2 (two) times daily as needed (SOB).) 30.6 Inhaler 1   tiotropium (SPIRIVA HANDIHALER) 18 MCG inhalation capsule Place 1 capsule (18 mcg total) into inhaler and inhale daily. 30 capsule 12   No facility-administered medications prior to visit.    Allergies  Allergen Reactions   Aspirin     REACTION: had h. pylori due to too many goody powders. told not to take any more asa    ROS Review of Systems  Constitutional:  Negative for appetite change and unexpected weight change.  Respiratory:  Negative for shortness of breath.   Cardiovascular:  Negative for chest pain.  Gastrointestinal:  Negative for constipation, nausea, rectal pain and vomiting.       See HPI     Objective:    Physical Exam Vitals reviewed.  Constitutional:      Appearance: Normal appearance.  Cardiovascular:     Rate and Rhythm: Normal rate and regular rhythm.  Pulmonary:     Effort: Pulmonary effort is normal.     Breath sounds: Normal breath sounds.  Genitourinary:    Comments: Rectal exam reveals no external hemorrhoids.  No obvious anal fissure.  Digital exam reveals some stool in the rectal vault but no impaction.  No masses palpated.  Hemoccult negative.  Anoscopy reveals no evidence for internal hemorrhoids or any other significant abnormalities. Skin:    Comments: Patient does have a couple of small vesicles near the midline several centimeters above the anal region.  No active bleeding.  Neurological:     Mental Status: She is alert.    BP 130/70 (BP Location: Left Arm, Patient Position: Sitting, Cuff Size: Normal)   Pulse 95   Temp 98.3 F (36.8 C) (Oral)   Wt 122 lb 4.8 oz (55.5 kg)   SpO2 95%   BMI 19.15 kg/m  Wt Readings from Last 3 Encounters:  04/16/21 122 lb 4.8 oz (55.5 kg)   11/20/20 121 lb 8 oz (55.1 kg)  07/15/20 111 lb 1.6 oz (50.4 kg)     Health Maintenance Due  Topic Date Due   COVID-19 Vaccine (1) Never done   TETANUS/TDAP  Never done   Zoster Vaccines- Shingrix (1 of 2) Never done   PAP SMEAR-Modifier  08/24/2011   MAMMOGRAM  10/18/2015   Pneumococcal Vaccine  43-43 Years old (2 - PPSV23 or PCV20) 09/13/2016    There are no preventive care reminders to display for this patient.  Lab Results  Component Value Date   TSH 1.19 04/29/2020   Lab Results  Component Value Date   WBC 5.2 04/29/2020   HGB 13.4 04/29/2020   HCT 39.8 04/29/2020   MCV 95.7 04/29/2020   PLT 232 04/29/2020   Lab Results  Component Value Date   NA 138 04/29/2020   K 4.4 04/29/2020   CO2 29 04/29/2020   GLUCOSE 98 04/29/2020   BUN 16 04/29/2020   CREATININE 0.74 04/29/2020   BILITOT 0.5 04/29/2020   ALKPHOS 77 06/10/2019   AST 16 04/29/2020   ALT 12 04/29/2020   PROT 6.6 04/29/2020   ALBUMIN 3.4 (L) 06/10/2019   CALCIUM 9.3 04/29/2020   ANIONGAP 12 06/10/2019   GFR 84.03 07/31/2018   Lab Results  Component Value Date   CHOL 219 (H) 04/29/2020   Lab Results  Component Value Date   HDL 77 04/29/2020   Lab Results  Component Value Date   LDLCALC 125 (H) 04/29/2020   Lab Results  Component Value Date   TRIG 80 04/29/2020   Lab Results  Component Value Date   CHOLHDL 2.8 04/29/2020   Lab Results  Component Value Date   HGBA1C 6.0 04/07/2017      Assessment & Plan:   #1 reported intermittent episodes of bright red blood per rectum with wiping.  Patient had colonoscopy 2015 with no polyps.  No obvious source of bleeding at this time with no obvious external hemorrhoids, anal fissure, or internal hemorrhoids by anoscopy.  Hemoccult negative  -Consider CBC -Recommend measures to reduce constipation -If she continues to have episodes of any bleeding (or anemia by labs) recommend follow-up with GI  #2 patient has a couple of small vesicles in  her upper buttock region.  These look consistent with either shingles or possibly herpetic but they are only a couple.  She denies history of herpes.  No significant pain  - start Valtrex 1 gm po tid for 7 days.  No orders of the defined types were placed in this encounter.   Follow-up: No follow-ups on file.    Carolann Littler, MD

## 2021-05-20 ENCOUNTER — Other Ambulatory Visit: Payer: Self-pay | Admitting: Family Medicine

## 2021-07-20 ENCOUNTER — Other Ambulatory Visit: Payer: Self-pay | Admitting: Family Medicine

## 2021-09-26 ENCOUNTER — Other Ambulatory Visit: Payer: Self-pay | Admitting: Family Medicine

## 2021-10-19 ENCOUNTER — Ambulatory Visit (INDEPENDENT_AMBULATORY_CARE_PROVIDER_SITE_OTHER): Payer: 59 | Admitting: Family Medicine

## 2021-10-19 VITALS — BP 120/70 | HR 97 | Temp 98.2°F | Wt 135.2 lb

## 2021-10-19 DIAGNOSIS — F419 Anxiety disorder, unspecified: Secondary | ICD-10-CM | POA: Diagnosis not present

## 2021-10-19 DIAGNOSIS — R768 Other specified abnormal immunological findings in serum: Secondary | ICD-10-CM

## 2021-10-19 MED ORDER — ESCITALOPRAM OXALATE 10 MG PO TABS: 10.0000 mg | ORAL_TABLET | Freq: Every day | ORAL | 3 refills | Status: DC

## 2021-10-19 NOTE — Progress Notes (Signed)
Established Patient Office Visit  Subjective:  Patient ID: [name], female    DOB: 1961/09/25  Age: 61 y.o. MRN: [id number]  CC:  Chief Complaint  Patient presents with   Exposure to STD    Received a letter after donating plasma statin that she has an STD. Patient states she has not had intercourse in over a year. Would like lab work to confirm Dx    HPI [name] presents for concern for positive screening test she had recently when she was donating plasma.  She received a letter after plasma donation that she had possible STD.  She has not had intercourse in over a year.  She brings in a copy of the paper today.  Basically she had a positive nontreponemal syphilis screen but her confirmatory treponemal test was nonreactive.  We explained this probably indicated a false positive initial screen.  She again has not been sexually active in over a year.  She also denies any prior history of syphilis.  Denies any skin rash or other symptoms recently.  Or other issue is chronic anxiety symptoms.  For years she took high-dose Xanax and we discontinued that.  She has pretty much daily pervasive anxiety symptoms.  She would like to consider possible SSRI medication.  She has tried a couple in the past without much success.  Past Medical History:  Diagnosis Date   ASTHMA UNSPECIFIED WITH EXACERBATION 07/21/2010   CARPAL TUNNEL SYNDROME, BILATERAL 01/16/2008   COPD 05/08/2008   GERD 11/11/9796   HELICOBACTER PYLORI GASTRITIS, HX OF 04/18/2007   PANIC DISORDER 04/18/2007   TOBACCO ABUSE 07/17/2009    Past Surgical History:  Procedure Laterality Date   APPENDECTOMY     OOPHORECTOMY      Family History  Problem Relation Age of Onset   Diabetes Mother    Cancer Father        lung smoke    Heart disease Father    Clotting disorder Brother    Breast cancer Paternal Aunt    Stomach cancer Paternal Uncle    Esophageal cancer Neg Hx    Colon cancer Neg Hx     Social History    Socioeconomic History   Marital status: Divorced    Spouse name: Not on file   Number of children: 1   Years of education: Not on file   Highest education level: Not on file  Occupational History   Occupation: CNA    Employer: SELF-EMPLOYED  Tobacco Use   Smoking status: Every Day    Packs/day: 1.00    Years: 50.00    Pack years: 50.00    Types: Cigarettes   Smokeless tobacco: Never   Tobacco comments:    Trying nicotine patches, made her nauseous at first, going to cut patches in half.  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    Long term smoker   Social Determinants of Health   Financial Resource Strain: Not on file  Food Insecurity: Not on file  Transportation Needs: Not on file  Physical Activity: Not on file  Stress: Not on file  Social Connections: Not on file  Intimate Partner Violence: Not on file  Outpatient Medications Prior to Visit  Medication Sig Dispense Refill   albuterol (VENTOLIN HFA) 108 (90 Base) MCG/ACT inhaler INHALE 2 PUFFS INTO LUNGS EVERY 4 HOURS AS NEEDED FOR WHEEZING OR SHORTNESS OF BREATH 8.5 each 1   valACYclovir (VALTREX) 1000 MG tablet Take 1 tablet (1,000 mg total) by mouth 3 (three) times daily. 21 tablet 0   No facility-administered medications prior to visit.    Allergies  Allergen Reactions   Aspirin     REACTION: had h. pylori due to too many goody powders. told not to take any more asa    ROS Review of Systems  Constitutional:  Negative for chills and fever.  Genitourinary:  Negative for dysuria and genital sores.  Skin:  Negative for rash.  Psychiatric/Behavioral:  Negative for dysphoric mood. The patient is nervous/anxious.      Objective:    Physical Exam Vitals reviewed.  Constitutional:       Appearance: Normal appearance.  Cardiovascular:     Rate and Rhythm: Normal rate and regular rhythm.  Neurological:     Mental Status: She is alert.  Psychiatric:        Mood and Affect: Mood normal.        Thought Content: Thought content normal.    BP 120/70 (BP Location: Left Arm, Patient Position: Sitting, Cuff Size: Normal)    Pulse 97    Temp 98.2 F (36.8 C) (Oral)    Wt 135 lb 3.2 oz (61.3 kg)    SpO2 100%    BMI 21.18 kg/m  Wt Readings from Last 3 Encounters:  10/19/21 135 lb 3.2 oz (61.3 kg)  04/16/21 122 lb 4.8 oz (55.5 kg)  11/20/20 121 lb 8 oz (55.1 kg)     Health Maintenance Due  Topic Date Due   COVID-19 Vaccine (1) Never done   TETANUS/TDAP  Never done   Zoster Vaccines- Shingrix (1 of 2) Never done   PAP SMEAR-Modifier  08/24/2011   MAMMOGRAM  10/18/2015   Pneumococcal Vaccine 65-27 Years old (2 - PPSV23 if available, else PCV20) 09/13/2016    There are no preventive care reminders to display for this patient.  Lab Results  Component Value Date   TSH 1.19 04/29/2020   Lab Results  Component Value Date   WBC 6.3 04/16/2021   HGB 12.8 04/16/2021   HCT 37.4 04/16/2021   MCV 95.1 04/16/2021   PLT 280.0 04/16/2021   Lab Results  Component Value Date   NA 138 04/29/2020   K 4.4 04/29/2020   CO2 29 04/29/2020   GLUCOSE 98 04/29/2020   BUN 16 04/29/2020   CREATININE 0.74 04/29/2020   BILITOT 0.5 04/29/2020   ALKPHOS 77 06/10/2019   AST 16 04/29/2020   ALT 12 04/29/2020   PROT 6.6 04/29/2020   ALBUMIN 3.4 (L) 06/10/2019   CALCIUM 9.3 04/29/2020   ANIONGAP 12 06/10/2019   GFR 84.03 07/31/2018   Lab Results  Component Value Date   CHOL 219 (H) 04/29/2020   Lab Results  Component Value Date   HDL 77 04/29/2020   Lab Results  Component Value Date   LDLCALC 125 (H) 04/29/2020   Lab Results  Component Value Date   TRIG 80 04/29/2020   Lab Results  Component Value Date   CHOLHDL 2.8 04/29/2020   Lab Results  Component Value Date    HGBA1C 6.0 04/07/2017      Assessment & Plan:   #1 positive nontreponemal STS screen with negative confirmatory treponemal  test.  We explained as above this is likely representing a false positive.  Would not recommend any further evaluation at this time-as she has not been sexually active now in over a year.  #2 chronic anxiety symptoms.  We agreed to trial of Lexapro 10 mg once daily.  She will give [place] some feedback in a month.  Avoid benzodiazepines  Meds ordered this encounter  Medications   escitalopram (LEXAPRO) 10 MG tablet    Sig: Take 1 tablet (10 mg total) by mouth daily.    Dispense:  30 tablet    Refill:  3    Follow-up: No follow-ups on file.    Carolann Littler, MD

## 2021-11-12 ENCOUNTER — Other Ambulatory Visit: Payer: Self-pay | Admitting: Family Medicine

## 2021-11-15 ENCOUNTER — Other Ambulatory Visit: Payer: Self-pay

## 2021-11-15 ENCOUNTER — Encounter (HOSPITAL_COMMUNITY): Payer: Self-pay

## 2021-11-15 ENCOUNTER — Emergency Department (HOSPITAL_COMMUNITY): Payer: 59

## 2021-11-15 ENCOUNTER — Inpatient Hospital Stay (HOSPITAL_COMMUNITY)
Admission: EM | Admit: 2021-11-15 | Discharge: 2021-11-23 | DRG: 471 | Disposition: A | Discharge status: Rehabilitation Facility | Payer: 59 | Attending: Surgery | Admitting: Surgery

## 2021-11-15 DIAGNOSIS — J189 Pneumonia, unspecified organism: Secondary | ICD-10-CM

## 2021-11-15 DIAGNOSIS — S0511XA Contusion of eyeball and orbital tissues, right eye, initial encounter: Secondary | ICD-10-CM | POA: Diagnosis present

## 2021-11-15 DIAGNOSIS — D62 Acute posthemorrhagic anemia: Secondary | ICD-10-CM | POA: Diagnosis present

## 2021-11-15 DIAGNOSIS — R339 Retention of urine, unspecified: Secondary | ICD-10-CM | POA: Diagnosis not present

## 2021-11-15 DIAGNOSIS — Z803 Family history of malignant neoplasm of breast: Secondary | ICD-10-CM

## 2021-11-15 DIAGNOSIS — S2243XA Multiple fractures of ribs, bilateral, initial encounter for closed fracture: Secondary | ICD-10-CM | POA: Diagnosis present

## 2021-11-15 DIAGNOSIS — M4802 Spinal stenosis, cervical region: Principal | ICD-10-CM | POA: Diagnosis present

## 2021-11-15 DIAGNOSIS — R0603 Acute respiratory distress: Secondary | ICD-10-CM

## 2021-11-15 DIAGNOSIS — K219 Gastro-esophageal reflux disease without esophagitis: Secondary | ICD-10-CM | POA: Diagnosis present

## 2021-11-15 DIAGNOSIS — Z800 Family history of malignant neoplasm of digestive organs: Secondary | ICD-10-CM

## 2021-11-15 DIAGNOSIS — S270XXA Traumatic pneumothorax, initial encounter: Secondary | ICD-10-CM

## 2021-11-15 DIAGNOSIS — Z8249 Family history of ischemic heart disease and other diseases of the circulatory system: Secondary | ICD-10-CM

## 2021-11-15 DIAGNOSIS — Z452 Encounter for adjustment and management of vascular access device: Secondary | ICD-10-CM

## 2021-11-15 DIAGNOSIS — Z20822 Contact with and (suspected) exposure to covid-19: Secondary | ICD-10-CM | POA: Diagnosis present

## 2021-11-15 DIAGNOSIS — F410 Panic disorder [episodic paroxysmal anxiety] without agoraphobia: Secondary | ICD-10-CM | POA: Diagnosis present

## 2021-11-15 DIAGNOSIS — Z23 Encounter for immunization: Secondary | ICD-10-CM

## 2021-11-15 DIAGNOSIS — E8770 Fluid overload, unspecified: Secondary | ICD-10-CM

## 2021-11-15 DIAGNOSIS — F17210 Nicotine dependence, cigarettes, uncomplicated: Secondary | ICD-10-CM | POA: Diagnosis present

## 2021-11-15 DIAGNOSIS — S12390A Other displaced fracture of fourth cervical vertebra, initial encounter for closed fracture: Secondary | ICD-10-CM | POA: Diagnosis present

## 2021-11-15 DIAGNOSIS — M542 Cervicalgia: Secondary | ICD-10-CM | POA: Diagnosis not present

## 2021-11-15 DIAGNOSIS — S22038A Other fracture of third thoracic vertebra, initial encounter for closed fracture: Secondary | ICD-10-CM | POA: Diagnosis present

## 2021-11-15 DIAGNOSIS — E873 Alkalosis: Secondary | ICD-10-CM | POA: Diagnosis not present

## 2021-11-15 DIAGNOSIS — T148XXA Other injury of unspecified body region, initial encounter: Secondary | ICD-10-CM

## 2021-11-15 DIAGNOSIS — Z981 Arthrodesis status: Secondary | ICD-10-CM

## 2021-11-15 DIAGNOSIS — J439 Emphysema, unspecified: Secondary | ICD-10-CM | POA: Diagnosis present

## 2021-11-15 DIAGNOSIS — R578 Other shock: Secondary | ICD-10-CM | POA: Diagnosis present

## 2021-11-15 DIAGNOSIS — S129XXA Fracture of neck, unspecified, initial encounter: Secondary | ICD-10-CM | POA: Diagnosis present

## 2021-11-15 DIAGNOSIS — S12690A Other displaced fracture of seventh cervical vertebra, initial encounter for closed fracture: Secondary | ICD-10-CM | POA: Diagnosis present

## 2021-11-15 DIAGNOSIS — I959 Hypotension, unspecified: Secondary | ICD-10-CM | POA: Diagnosis present

## 2021-11-15 DIAGNOSIS — M4312 Spondylolisthesis, cervical region: Secondary | ICD-10-CM | POA: Diagnosis present

## 2021-11-15 DIAGNOSIS — Z833 Family history of diabetes mellitus: Secondary | ICD-10-CM

## 2021-11-15 DIAGNOSIS — Z419 Encounter for procedure for purposes other than remedying health state, unspecified: Secondary | ICD-10-CM

## 2021-11-15 DIAGNOSIS — S22000A Wedge compression fracture of unspecified thoracic vertebra, initial encounter for closed fracture: Secondary | ICD-10-CM

## 2021-11-15 DIAGNOSIS — S2241XA Multiple fractures of ribs, right side, initial encounter for closed fracture: Secondary | ICD-10-CM

## 2021-11-15 DIAGNOSIS — S01111A Laceration without foreign body of right eyelid and periocular area, initial encounter: Secondary | ICD-10-CM | POA: Diagnosis present

## 2021-11-15 DIAGNOSIS — S12490A Other displaced fracture of fifth cervical vertebra, initial encounter for closed fracture: Secondary | ICD-10-CM | POA: Diagnosis present

## 2021-11-15 DIAGNOSIS — S0181XA Laceration without foreign body of other part of head, initial encounter: Secondary | ICD-10-CM

## 2021-11-15 DIAGNOSIS — S12590A Other displaced fracture of sixth cervical vertebra, initial encounter for closed fracture: Secondary | ICD-10-CM | POA: Diagnosis present

## 2021-11-15 DIAGNOSIS — S27331A Laceration of lung, unilateral, initial encounter: Secondary | ICD-10-CM | POA: Diagnosis present

## 2021-11-15 DIAGNOSIS — J939 Pneumothorax, unspecified: Secondary | ICD-10-CM

## 2021-11-15 DIAGNOSIS — S2242XA Multiple fractures of ribs, left side, initial encounter for closed fracture: Secondary | ICD-10-CM

## 2021-11-15 DIAGNOSIS — J9690 Respiratory failure, unspecified, unspecified whether with hypoxia or hypercapnia: Secondary | ICD-10-CM

## 2021-11-15 DIAGNOSIS — S22058A Other fracture of T5-T6 vertebra, initial encounter for closed fracture: Secondary | ICD-10-CM | POA: Diagnosis present

## 2021-11-15 DIAGNOSIS — S42202A Unspecified fracture of upper end of left humerus, initial encounter for closed fracture: Secondary | ICD-10-CM

## 2021-11-15 DIAGNOSIS — T1490XA Injury, unspecified, initial encounter: Secondary | ICD-10-CM

## 2021-11-15 DIAGNOSIS — S42215A Unspecified nondisplaced fracture of surgical neck of left humerus, initial encounter for closed fracture: Secondary | ICD-10-CM | POA: Diagnosis present

## 2021-11-15 DIAGNOSIS — Y92410 Unspecified street and highway as the place of occurrence of the external cause: Secondary | ICD-10-CM

## 2021-11-15 DIAGNOSIS — J90 Pleural effusion, not elsewhere classified: Secondary | ICD-10-CM | POA: Diagnosis not present

## 2021-11-15 DIAGNOSIS — D72828 Other elevated white blood cell count: Secondary | ICD-10-CM | POA: Diagnosis present

## 2021-11-15 DIAGNOSIS — E785 Hyperlipidemia, unspecified: Secondary | ICD-10-CM | POA: Diagnosis present

## 2021-11-15 LAB — I-STAT CHEM 8, ED
BUN: 13 mg/dL (ref 8–23)
Calcium, Ion: 1.1 mmol/L — ABNORMAL LOW (ref 1.15–1.40)
Chloride: 103 mmol/L (ref 98–111)
Creatinine, Ser: 0.9 mg/dL (ref 0.44–1.00)
Glucose, Bld: 99 mg/dL (ref 70–99)
HCT: 36 % (ref 36.0–46.0)
Hemoglobin: 12.2 g/dL (ref 12.0–15.0)
Potassium: 3.5 mmol/L (ref 3.5–5.1)
Sodium: 138 mmol/L (ref 135–145)
TCO2: 26 mmol/L (ref 22–32)

## 2021-11-15 LAB — CBC
HCT: 36 % (ref 36.0–46.0)
Hemoglobin: 11.7 g/dL — ABNORMAL LOW (ref 12.0–15.0)
MCH: 32.3 pg (ref 26.0–34.0)
MCHC: 32.5 g/dL (ref 30.0–36.0)
MCV: 99.4 fL (ref 80.0–100.0)
Platelets: 251 10*3/uL (ref 150–400)
RBC: 3.62 MIL/uL — ABNORMAL LOW (ref 3.87–5.11)
RDW: 13.3 % (ref 11.5–15.5)
WBC: 15 10*3/uL — ABNORMAL HIGH (ref 4.0–10.5)
nRBC: 0 % (ref 0.0–0.2)

## 2021-11-15 LAB — PROTIME-INR
INR: 1 (ref 0.8–1.2)
Prothrombin Time: 13.1 seconds (ref 11.4–15.2)

## 2021-11-15 LAB — SAMPLE TO BLOOD BANK

## 2021-11-15 MED ORDER — FENTANYL CITRATE PF 50 MCG/ML IJ SOSY
50.0000 ug | PREFILLED_SYRINGE | Freq: Once | INTRAMUSCULAR | Status: AC
  Administered 2021-11-15: 50 ug via INTRAVENOUS
  Filled 2021-11-15: qty 1

## 2021-11-15 MED ORDER — SODIUM CHLORIDE 0.9 % IV SOLN: INTRAVENOUS | Status: DC

## 2021-11-15 MED ORDER — TETANUS-DIPHTH-ACELL PERTUSSIS 5-2.5-18.5 LF-MCG/0.5 IM SUSY
0.5000 mL | PREFILLED_SYRINGE | Freq: Once | INTRAMUSCULAR | Status: AC
  Administered 2021-11-16: 0.5 mL via INTRAMUSCULAR
  Filled 2021-11-15: qty 0.5

## 2021-11-15 MED ORDER — IOHEXOL 300 MG/ML  SOLN
100.0000 mL | Freq: Once | INTRAMUSCULAR | Status: AC | PRN
  Administered 2021-11-15: 100 mL via INTRAVENOUS

## 2021-11-15 MED ORDER — SODIUM CHLORIDE 0.9 % IV BOLUS
1000.0000 mL | Freq: Once | INTRAVENOUS | Status: AC
  Administered 2021-11-15: 1000 mL via INTRAVENOUS
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
## 2021-11-15 NOTE — Progress Notes (Signed)
Met pt as transferred to XRAY  No emotional spiritual distress exhibited.       11/15/21 2300  Clinical Encounter Type  Visited With Patient  Visit Type Initial  Referral From Nurse  Consult/Referral To Chaplain  Spiritual Encounters  Spiritual Needs Prayer  Stress Factors  Patient Stress Factors Loss of control  Family Stress Factors None identified
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
## 2021-11-15 NOTE — ED Notes (Signed)
Pt reports drinking one beer every night and only had half a beer tonight.

## 2021-11-16 ENCOUNTER — Inpatient Hospital Stay (HOSPITAL_COMMUNITY): Payer: 59

## 2021-11-16 ENCOUNTER — Encounter (HOSPITAL_COMMUNITY): Payer: Self-pay

## 2021-11-16 DIAGNOSIS — S27331A Laceration of lung, unilateral, initial encounter: Secondary | ICD-10-CM | POA: Diagnosis present

## 2021-11-16 DIAGNOSIS — M792 Neuralgia and neuritis, unspecified: Secondary | ICD-10-CM | POA: Diagnosis not present

## 2021-11-16 DIAGNOSIS — J439 Emphysema, unspecified: Secondary | ICD-10-CM | POA: Diagnosis present

## 2021-11-16 DIAGNOSIS — S14106A Unspecified injury at C6 level of cervical spinal cord, initial encounter: Secondary | ICD-10-CM | POA: Diagnosis not present

## 2021-11-16 DIAGNOSIS — Z23 Encounter for immunization: Secondary | ICD-10-CM | POA: Diagnosis not present

## 2021-11-16 DIAGNOSIS — M4802 Spinal stenosis, cervical region: Secondary | ICD-10-CM | POA: Diagnosis present

## 2021-11-16 DIAGNOSIS — R339 Retention of urine, unspecified: Secondary | ICD-10-CM | POA: Diagnosis not present

## 2021-11-16 DIAGNOSIS — F419 Anxiety disorder, unspecified: Secondary | ICD-10-CM | POA: Diagnosis not present

## 2021-11-16 DIAGNOSIS — S13101A Dislocation of unspecified cervical vertebrae, initial encounter: Secondary | ICD-10-CM | POA: Diagnosis not present

## 2021-11-16 DIAGNOSIS — S129XXD Fracture of neck, unspecified, subsequent encounter: Secondary | ICD-10-CM | POA: Diagnosis not present

## 2021-11-16 DIAGNOSIS — Z20822 Contact with and (suspected) exposure to covid-19: Secondary | ICD-10-CM | POA: Diagnosis present

## 2021-11-16 DIAGNOSIS — D62 Acute posthemorrhagic anemia: Secondary | ICD-10-CM | POA: Diagnosis present

## 2021-11-16 DIAGNOSIS — F411 Generalized anxiety disorder: Secondary | ICD-10-CM | POA: Diagnosis not present

## 2021-11-16 DIAGNOSIS — S14107A Unspecified injury at C7 level of cervical spinal cord, initial encounter: Secondary | ICD-10-CM | POA: Diagnosis not present

## 2021-11-16 DIAGNOSIS — R578 Other shock: Secondary | ICD-10-CM | POA: Diagnosis present

## 2021-11-16 DIAGNOSIS — S22058A Other fracture of T5-T6 vertebra, initial encounter for closed fracture: Secondary | ICD-10-CM | POA: Diagnosis present

## 2021-11-16 DIAGNOSIS — S0511XA Contusion of eyeball and orbital tissues, right eye, initial encounter: Secondary | ICD-10-CM | POA: Diagnosis present

## 2021-11-16 DIAGNOSIS — G8918 Other acute postprocedural pain: Secondary | ICD-10-CM | POA: Diagnosis not present

## 2021-11-16 DIAGNOSIS — S12490A Other displaced fracture of fifth cervical vertebra, initial encounter for closed fracture: Secondary | ICD-10-CM | POA: Diagnosis present

## 2021-11-16 DIAGNOSIS — S129XXS Fracture of neck, unspecified, sequela: Secondary | ICD-10-CM | POA: Diagnosis not present

## 2021-11-16 DIAGNOSIS — T1490XA Injury, unspecified, initial encounter: Secondary | ICD-10-CM | POA: Diagnosis not present

## 2021-11-16 DIAGNOSIS — S129XXA Fracture of neck, unspecified, initial encounter: Secondary | ICD-10-CM | POA: Diagnosis not present

## 2021-11-16 DIAGNOSIS — S42215A Unspecified nondisplaced fracture of surgical neck of left humerus, initial encounter for closed fracture: Secondary | ICD-10-CM | POA: Diagnosis present

## 2021-11-16 DIAGNOSIS — E873 Alkalosis: Secondary | ICD-10-CM | POA: Diagnosis not present

## 2021-11-16 DIAGNOSIS — Y92410 Unspecified street and highway as the place of occurrence of the external cause: Secondary | ICD-10-CM | POA: Diagnosis not present

## 2021-11-16 DIAGNOSIS — D72828 Other elevated white blood cell count: Secondary | ICD-10-CM | POA: Diagnosis present

## 2021-11-16 DIAGNOSIS — S22038A Other fracture of third thoracic vertebra, initial encounter for closed fracture: Secondary | ICD-10-CM | POA: Diagnosis present

## 2021-11-16 DIAGNOSIS — F17210 Nicotine dependence, cigarettes, uncomplicated: Secondary | ICD-10-CM | POA: Diagnosis present

## 2021-11-16 DIAGNOSIS — S12690A Other displaced fracture of seventh cervical vertebra, initial encounter for closed fracture: Secondary | ICD-10-CM | POA: Diagnosis present

## 2021-11-16 DIAGNOSIS — S12590A Other displaced fracture of sixth cervical vertebra, initial encounter for closed fracture: Secondary | ICD-10-CM | POA: Diagnosis present

## 2021-11-16 DIAGNOSIS — S2243XA Multiple fractures of ribs, bilateral, initial encounter for closed fracture: Secondary | ICD-10-CM | POA: Diagnosis present

## 2021-11-16 DIAGNOSIS — I959 Hypotension, unspecified: Secondary | ICD-10-CM | POA: Diagnosis present

## 2021-11-16 DIAGNOSIS — S270XXA Traumatic pneumothorax, initial encounter: Secondary | ICD-10-CM | POA: Diagnosis present

## 2021-11-16 DIAGNOSIS — M542 Cervicalgia: Secondary | ICD-10-CM | POA: Diagnosis present

## 2021-11-16 DIAGNOSIS — E785 Hyperlipidemia, unspecified: Secondary | ICD-10-CM | POA: Diagnosis present

## 2021-11-16 DIAGNOSIS — S12390A Other displaced fracture of fourth cervical vertebra, initial encounter for closed fracture: Secondary | ICD-10-CM | POA: Diagnosis present

## 2021-11-16 DIAGNOSIS — J90 Pleural effusion, not elsewhere classified: Secondary | ICD-10-CM | POA: Diagnosis not present

## 2021-11-16 DIAGNOSIS — F410 Panic disorder [episodic paroxysmal anxiety] without agoraphobia: Secondary | ICD-10-CM | POA: Diagnosis present

## 2021-11-16 LAB — CBC
HCT: 35.2 % — ABNORMAL LOW (ref 36.0–46.0)
Hemoglobin: 11 g/dL — ABNORMAL LOW (ref 12.0–15.0)
MCH: 31.4 pg (ref 26.0–34.0)
MCHC: 31.3 g/dL (ref 30.0–36.0)
MCV: 100.6 fL — ABNORMAL HIGH (ref 80.0–100.0)
Platelets: 186 10*3/uL (ref 150–400)
RBC: 3.5 MIL/uL — ABNORMAL LOW (ref 3.87–5.11)
RDW: 13.6 % (ref 11.5–15.5)
WBC: 8.9 10*3/uL (ref 4.0–10.5)
nRBC: 0 % (ref 0.0–0.2)

## 2021-11-16 LAB — ETHANOL: Alcohol, Ethyl (B): <10 mg/dL (ref <10)

## 2021-11-16 LAB — URINALYSIS, ROUTINE W REFLEX MICROSCOPIC
Bilirubin Urine: NEGATIVE
Glucose, UA: NEGATIVE mg/dL
Hgb urine dipstick: NEGATIVE
Ketones, ur: 20 mg/dL — AB
Leukocytes,Ua: NEGATIVE
Nitrite: NEGATIVE
Protein, ur: NEGATIVE mg/dL
Specific Gravity, Urine: >1.046 — ABNORMAL HIGH (ref 1.005–1.030)
pH: 5 (ref 5.0–8.0)

## 2021-11-16 LAB — COMPREHENSIVE METABOLIC PANEL
ALT: 42 U/L (ref 0–44)
AST: 46 U/L — ABNORMAL HIGH (ref 15–41)
Albumin: 2.8 g/dL — ABNORMAL LOW (ref 3.5–5.0)
Alkaline Phosphatase: 58 U/L (ref 38–126)
Anion gap: 10 (ref 5–15)
BUN: 12 mg/dL (ref 8–23)
CO2: 23 mmol/L (ref 22–32)
Calcium: 7.9 mg/dL — ABNORMAL LOW (ref 8.9–10.3)
Chloride: 103 mmol/L (ref 98–111)
Creatinine, Ser: 0.94 mg/dL (ref 0.44–1.00)
GFR, Estimated: >60 mL/min (ref >60)
Glucose, Bld: 105 mg/dL — ABNORMAL HIGH (ref 70–99)
Potassium: 3.6 mmol/L (ref 3.5–5.1)
Sodium: 136 mmol/L (ref 135–145)
Total Bilirubin: 0.5 mg/dL (ref 0.3–1.2)
Total Protein: 5.1 g/dL — ABNORMAL LOW (ref 6.5–8.1)

## 2021-11-16 LAB — RESP PANEL BY RT-PCR (FLU A&B, COVID) ARPGX2
Influenza A by PCR: NEGATIVE
Influenza B by PCR: NEGATIVE
SARS Coronavirus 2 by RT PCR: NEGATIVE

## 2021-11-16 LAB — HIV ANTIBODY (ROUTINE TESTING W REFLEX): HIV Screen 4th Generation wRfx: NONREACTIVE

## 2021-11-16 LAB — LACTIC ACID, PLASMA: Lactic Acid, Venous: 1.2 mmol/L (ref 0.5–1.9)

## 2021-11-16 MED ORDER — PHENYLEPHRINE HCL-NACL 20-0.9 MG/250ML-% IV SOLN: 0.0000 ug/min | INTRAVENOUS | Status: DC

## 2021-11-16 MED ORDER — IPRATROPIUM-ALBUTEROL 0.5-2.5 (3) MG/3ML IN SOLN
3.0000 mL | Freq: Four times a day (QID) | RESPIRATORY_TRACT | Status: DC | PRN
  Administered 2021-11-18 – 2021-11-22 (×4): 3 mL via RESPIRATORY_TRACT
  Filled 2021-11-16 (×4): qty 3

## 2021-11-16 MED ORDER — ACETAMINOPHEN 500 MG PO TABS
1000.0000 mg | ORAL_TABLET | Freq: Four times a day (QID) | ORAL | Status: DC
  Administered 2021-11-16 – 2021-11-18 (×11): 1000 mg via ORAL
  Filled 2021-11-16 (×11): qty 2

## 2021-11-16 MED ORDER — MORPHINE SULFATE (PF) 4 MG/ML IV SOLN
4.0000 mg | INTRAVENOUS | Status: DC | PRN
  Administered 2021-11-16 – 2021-11-19 (×13): 4 mg via INTRAVENOUS
  Filled 2021-11-16 (×13): qty 1

## 2021-11-16 MED ORDER — CHLORHEXIDINE GLUCONATE CLOTH 2 % EX PADS
6.0000 | MEDICATED_PAD | Freq: Every day | CUTANEOUS | Status: DC
  Administered 2021-11-17 – 2021-11-23 (×7): 6 via TOPICAL

## 2021-11-16 MED ORDER — ONDANSETRON HCL 4 MG/2ML IJ SOLN
4.0000 mg | Freq: Four times a day (QID) | INTRAMUSCULAR | Status: DC | PRN
  Administered 2021-11-16: 4 mg via INTRAVENOUS
  Filled 2021-11-16: qty 2

## 2021-11-16 MED ORDER — ONDANSETRON 4 MG PO TBDP: 4.0000 mg | ORAL_TABLET | Freq: Four times a day (QID) | ORAL | Status: DC | PRN

## 2021-11-16 MED ORDER — IOHEXOL 300 MG/ML  SOLN
60.0000 mL | Freq: Once | INTRAMUSCULAR | Status: AC | PRN
  Administered 2021-11-16: 60 mL via INTRAVENOUS

## 2021-11-16 MED ORDER — OXYCODONE HCL 5 MG PO TABS
5.0000 mg | ORAL_TABLET | ORAL | Status: DC | PRN
  Administered 2021-11-16 – 2021-11-19 (×10): 10 mg via ORAL
  Administered 2021-11-20: 5 mg via ORAL
  Administered 2021-11-20: 10 mg via ORAL
  Administered 2021-11-20: 5 mg via ORAL
  Administered 2021-11-21 – 2021-11-23 (×11): 10 mg via ORAL
  Filled 2021-11-16 (×23): qty 2

## 2021-11-16 MED ORDER — LIDOCAINE-EPINEPHRINE 2 %-1:200000 IJ SOLN
20.0000 mL | Freq: Once | INTRAMUSCULAR | Status: AC
Start: 2021-11-16 — End: 2021-11-16
  Administered 2021-11-16: 20 mL
  Filled 2021-11-16: qty 20

## 2021-11-16 MED ORDER — KETOROLAC TROMETHAMINE 15 MG/ML IJ SOLN
30.0000 mg | Freq: Four times a day (QID) | INTRAMUSCULAR | Status: DC
  Administered 2021-11-16 – 2021-11-19 (×15): 30 mg via INTRAVENOUS
  Filled 2021-11-16 (×15): qty 2

## 2021-11-16 MED ORDER — LORAZEPAM 2 MG/ML IJ SOLN
1.0000 mg | Freq: Once | INTRAMUSCULAR | Status: AC
Start: 2021-11-16 — End: 2021-11-16
  Administered 2021-11-16: 1 mg via INTRAVENOUS
  Filled 2021-11-16: qty 1

## 2021-11-16 MED ORDER — PHENYLEPHRINE HCL-NACL 20-0.9 MG/250ML-% IV SOLN
0.0000 ug/min | INTRAVENOUS | Status: DC
  Administered 2021-11-16: 25 ug/min via INTRAVENOUS
  Filled 2021-11-16 (×2): qty 250

## 2021-11-16 MED ORDER — DEXAMETHASONE SODIUM PHOSPHATE 4 MG/ML IJ SOLN
4.0000 mg | Freq: Four times a day (QID) | INTRAMUSCULAR | Status: AC
  Administered 2021-11-16 – 2021-11-17 (×4): 4 mg via INTRAVENOUS
  Filled 2021-11-16 (×5): qty 1

## 2021-11-16 MED ORDER — SODIUM CHLORIDE 0.9 % IV SOLN
250.0000 mL | INTRAVENOUS | Status: DC
  Administered 2021-11-16: 250 mL via INTRAVENOUS

## 2021-11-16 MED ORDER — LACTATED RINGERS IV BOLUS
1000.0000 mL | Freq: Once | INTRAVENOUS | Status: AC
  Administered 2021-11-16: 1000 mL via INTRAVENOUS

## 2021-11-16 MED ORDER — MORPHINE SULFATE (PF) 4 MG/ML IV SOLN
4.0000 mg | Freq: Once | INTRAVENOUS | Status: AC
  Administered 2021-11-16: 4 mg via INTRAVENOUS
  Filled 2021-11-16: qty 1

## 2021-11-16 MED ORDER — DOCUSATE SODIUM 100 MG PO CAPS
100.0000 mg | ORAL_CAPSULE | Freq: Two times a day (BID) | ORAL | Status: DC
  Administered 2021-11-17 – 2021-11-23 (×10): 100 mg via ORAL
  Filled 2021-11-16 (×11): qty 1

## 2021-11-16 MED ORDER — LACTATED RINGERS IV SOLN: INTRAVENOUS | Status: DC

## 2021-11-16 MED ORDER — SODIUM CHLORIDE 0.9 % IV BOLUS
1000.0000 mL | Freq: Once | INTRAVENOUS | Status: AC
  Administered 2021-11-16: 1000 mL via INTRAVENOUS

## 2021-11-16 NOTE — ED Notes (Signed)
Patient transported to CT 

## 2021-11-16 NOTE — Progress Notes (Signed)
Called to see patient as her BP was low in the 80s upon arrival to the unit.  This has been an ongoing issue today.  She did respond to 1L of fluid given earlier this morning.  She has not voided since she was I/O cath at 0900am this morning which she got 500cc.  In review, she has received 3 separate 1L boluses today.  She is bladder scanned currently with over 400cc present.  We will insert a foley, but given 500cc and 400cc, her hypotension is unlikely to be from dehydration at this point.    Her hgb is relatively stable and no other etiology for hypotension except possibly neurogenic in nature.  She is alert and very oriented currently and actually looks significantly better than this morning when I first saw her.  Given her persistent low BP, we will transfer her to the ICU and write for neo incase she requires some boost for her BP overnight.  Discussed with Dr. Grandville Silos who agrees with this plan.  Henreitta Cea 4:25 PM 11/16/2021

## 2021-11-16 NOTE — ED Notes (Signed)
Pt placed on a external catheter. Tolerated well.

## 2021-11-16 NOTE — ED Notes (Signed)
Patient transported to MRI 

## 2021-11-16 NOTE — Progress Notes (Signed)
Orthopedic Tech Progress Note Patient Details:  [name] 10-31-1960 [id number]  Patient ID: [name], female   DOB: 10-Nov-1960, 61 y.o.   MRN: [id number] I attended trauma page. Karolee Stamps 11/16/2021, 1:13 AM

## 2021-11-16 NOTE — Plan of Care (Addendum)
BP soft notified PA Saverio Danker, ordered NS bolus and bladder scan, will continue to monitor patient.  @1600  Bladder scan 426 @1620  Foley placed  @1630  PO fluids 612ml @1630  foley output 573ml  Problem: Education: Goal: Knowledge of General Education information will improve Description: Including pain rating scale, medication(s)/side effects and non-pharmacologic comfort measures Outcome: Progressing   Problem: Activity: Goal: Risk for activity intolerance will decrease Outcome: Progressing   Problem: Pain Managment: Goal: General experience of comfort will improve Outcome: Progressing   Problem: Safety: Goal: Ability to remain free from injury will improve Outcome: Progressing   Problem: Skin Integrity: Goal: Risk for impaired skin integrity will decrease Outcome: Progressing

## 2021-11-16 NOTE — ED Notes (Signed)
Infusion to L AC stopped, IV removed due to L humeral neck fracture, LR now infusing in R AC. Pt resting with eyes closed, opens eyes spontaneously, PERRLA @3 , CMS intact in all extremities. Pt remains supine with [place] J collar in place. Pt reminded to remain on her back, pt remains on 3L 02 [place].

## 2021-11-16 NOTE — Progress Notes (Signed)
°  Transition of Care [facility]) Screening Note   Patient Details  Name: [name] Date of Birth: 06-Feb-1961   [facility]) CM/SW Contact:    Ella Bodo, RN Phone Number: 11/16/2021, 4:49 PM    Transition of Care Department [facility]) has reviewed patient and no TOC needs have been identified at this time. We will continue to monitor patient advancement through interdisciplinary progression rounds. If new patient transition needs arise, please place a TOC consult.  Reinaldo Raddle, RN, BSN  Trauma/Neuro ICU Case Manager [phone]

## 2021-11-16 NOTE — Progress Notes (Signed)
Upon entering room, cardiac monitor screen noted to be off. Patient's niece at bedside, and stated she shut it off because it was beeping. Niece educated by this RN on importance of all ICU equipment and verbalized understanding.
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
## 2021-11-16 NOTE — ED Notes (Signed)
Pt back from MRI 

## 2021-11-16 NOTE — ED Notes (Signed)
Daughter -- Duwaine Maxin -- [phone] PLEASE CONTACT WITH INFORMATION REGARDING TREATMENT AND CARE OF PT>
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
## 2021-11-16 NOTE — TOC CAGE-AID Note (Signed)
Transition of Care [facility]) - CAGE-AID Screening   Patient Details  Name: [name] MRN: [id number] Date of Birth: [birth year]  Transition of Care [facility]) CM/SW Contact:    Militza Devery C Tarpley-Carter, [place] Phone Number: 11/16/2021, 9:13 AM   Clinical Narrative: Pt participated in [place].  Pt stated she does not use substance or ETOH. Pt does smoke cigarettes. Pt was offered resources, due to usage of substance.     Derita Michelsen Tarpley-Carter, MSW, LCSW-A Pronouns:  She/Her/Hers [place] Transitions of Care Clinical Social Worker Direct Number:  [phone] [email or web address]   CAGE-AID Screening:    Have You Ever Felt You Ought to Cut Down on Your Drinking or Drug Use?: No Have People Annoyed You By [employer] Your Drinking Or Drug Use?: No Have You Felt Bad Or Guilty About Your Drinking Or Drug Use?: No Have You Ever Had a Drink or Used Drugs First Thing In The Morning to Steady Your Nerves or to Get Rid of a Hangover?: No CAGE-AID Score: 0  Substance Abuse Education Offered: Yes (Pt smokes cigarettes.)  Substance abuse interventions: Scientist, clinical (histocompatibility and immunogenetics)

## 2021-11-16 NOTE — ED Notes (Incomplete)
Pt switched into [name]

## 2021-11-16 NOTE — Progress Notes (Signed)
Patient belongings on admission:  Purple muck boots Top and Bottom dentures [employer] - multiple cards, ID (no cash) Purse- bills, makeup, cigarettes Clothing
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
## 2021-11-16 NOTE — ED Notes (Signed)
Pt back from CT

## 2021-11-17 ENCOUNTER — Inpatient Hospital Stay (HOSPITAL_COMMUNITY): Payer: 59

## 2021-11-17 LAB — BASIC METABOLIC PANEL
Anion gap: 5 (ref 5–15)
BUN: 13 mg/dL (ref 8–23)
CO2: 26 mmol/L (ref 22–32)
Calcium: 8.4 mg/dL — ABNORMAL LOW (ref 8.9–10.3)
Chloride: 105 mmol/L (ref 98–111)
Creatinine, Ser: 0.67 mg/dL (ref 0.44–1.00)
GFR, Estimated: >60 mL/min (ref >60)
Glucose, Bld: 133 mg/dL — ABNORMAL HIGH (ref 70–99)
Potassium: 4.7 mmol/L (ref 3.5–5.1)
Sodium: 136 mmol/L (ref 135–145)

## 2021-11-17 LAB — CBC
HCT: 32.6 % — ABNORMAL LOW (ref 36.0–46.0)
Hemoglobin: 10.6 g/dL — ABNORMAL LOW (ref 12.0–15.0)
MCH: 32.3 pg (ref 26.0–34.0)
MCHC: 32.5 g/dL (ref 30.0–36.0)
MCV: 99.4 fL (ref 80.0–100.0)
Platelets: 216 10*3/uL (ref 150–400)
RBC: 3.28 MIL/uL — ABNORMAL LOW (ref 3.87–5.11)
RDW: 13.5 % (ref 11.5–15.5)
WBC: 16.3 10*3/uL — ABNORMAL HIGH (ref 4.0–10.5)
nRBC: 0 % (ref 0.0–0.2)

## 2021-11-17 LAB — MRSA NEXT GEN BY PCR, NASAL: MRSA by PCR Next Gen: DETECTED — AB

## 2021-11-17 MED ORDER — NICOTINE 14 MG/24HR TD PT24
14.0000 mg | MEDICATED_PATCH | Freq: Every day | TRANSDERMAL | Status: DC
  Administered 2021-11-17 – 2021-11-23 (×7): 14 mg via TRANSDERMAL
  Filled 2021-11-17 (×7): qty 1

## 2021-11-17 MED ORDER — LIDOCAINE HCL (PF) 1 % IJ SOLN
INTRAMUSCULAR | Status: AC
  Administered 2021-11-17: 30 mL
  Filled 2021-11-17: qty 30

## 2021-11-17 MED ORDER — NOREPINEPHRINE 4 MG/250ML-% IV SOLN
2.0000 ug/min | INTRAVENOUS | Status: DC
  Administered 2021-11-17: 2 ug/min via INTRAVENOUS
  Filled 2021-11-17 (×2): qty 250

## 2021-11-17 MED ORDER — ENOXAPARIN SODIUM 30 MG/0.3ML IJ SOSY
30.0000 mg | PREFILLED_SYRINGE | Freq: Two times a day (BID) | INTRAMUSCULAR | Status: DC
  Administered 2021-11-17 (×2): 30 mg via SUBCUTANEOUS
  Filled 2021-11-17 (×2): qty 0.3

## 2021-11-17 MED ORDER — BUSPIRONE HCL 10 MG PO TABS
10.0000 mg | ORAL_TABLET | Freq: Three times a day (TID) | ORAL | Status: DC
  Administered 2021-11-17 – 2021-11-21 (×13): 10 mg via ORAL
  Filled 2021-11-17 (×13): qty 1

## 2021-11-17 MED ORDER — LACTATED RINGERS IV BOLUS
1000.0000 mL | Freq: Once | INTRAVENOUS | Status: AC
  Administered 2021-11-17: 1000 mL via INTRAVENOUS
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
## 2021-11-17 NOTE — Progress Notes (Signed)
Orthopedic Tech Progress Note Patient Details:  [name] July 16, 1961 [id number]  Ortho Devices Type of Ortho Device: Shoulder immobilizer Ortho Device/Splint Location: LUE Ortho Device/Splint Interventions: Ordered, Application, Adjustment   Post Interventions Patient Tolerated: Well Instructions Provided: Care of device  Janit Pagan 11/17/2021, 11:56 AM

## 2021-11-17 NOTE — Procedures (Signed)
° °  Procedure Note  Date: 11/17/2021  Procedure: central venous catheter placement--right, subclavian vein, without ultrasound guidance  Pre-op diagnosis: hypotension, need for administration of vasopressors Post-op diagnosis: same  Surgeon: Jesusita Oka, MD  Anesthesia: local  EBL: <5cc Drains/Implants: 20 cm, triple lumen central venous catheter  Description of procedure: Time-out was performed verifying correct patient, procedure, site, laterality, and signature of informed consent. The right upper chest was prepped and draped in the usual sterile fashion. Ten ccs of local anesthetic was infiltrated at the site of venous access. The right subclavian vein was accessed using an introducer needle and a guidewire passed through the needle. The needle was removed and a skin nick was made. The tract was dilated and the central venous catheter advanced over the guidewire followed by removal of the guidewire. All ports drew blood easily and all were flushed with saline. The catheter was secured to the skin with suture and a sterile dressing. The patient tolerated the procedure well. There were no immediate complications. Follow up chest x-ray was ordered to confirm positioning and the absence of a pneumothorax.  Jesusita Oka, MD General and [facility]
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
## 2021-11-17 NOTE — Procedures (Signed)
Arterial Catheter Insertion Procedure Note  KLARYSSA FAUTH  [id number]  1961/07/13  Date:11/17/21  Time:11:22 AM    Provider Performing: Lenna Sciara    Procedure: Insertion of Arterial Line [phone]) without US guidance  Indication(s) Blood pressure monitoring and/or need for frequent ABGs  Consent Risks of the procedure as well as the alternatives and risks of each were explained to the patient and/or caregiver.  Consent for the procedure was obtained and is signed in the bedside chart  Anesthesia None   Time Out Verified patient identification, verified procedure, site/side was marked, verified correct patient position, special equipment/implants available, medications/allergies/relevant history reviewed, required imaging and test results available.   Sterile Technique Maximal sterile technique including full sterile barrier drape, hand hygiene, sterile gown, sterile gloves, mask, hair covering, sterile ultrasound probe cover (if used).   Procedure Description Area of catheter insertion was cleaned with chlorhexidine and draped in sterile fashion. Without real-time ultrasound guidance an arterial catheter was placed into the right radial artery.  Appropriate arterial tracings confirmed on monitor.      Complications/Tolerance None; patient tolerated the procedure well.   Initially attempted on left radial, noted arterial blood flow but was unable to pass the catheter.  S/p sticking left arm, doppler was performed to ensure blood flow remains,  noted + blood flow/pulses.   EBL Minimal   Specimen(s) None
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
## 2021-11-18 LAB — CBC
HCT: 28.1 % — ABNORMAL LOW (ref 36.0–46.0)
Hemoglobin: 8.4 g/dL — ABNORMAL LOW (ref 12.0–15.0)
MCH: 31.5 pg (ref 26.0–34.0)
MCHC: 29.9 g/dL — ABNORMAL LOW (ref 30.0–36.0)
MCV: 105.2 fL — ABNORMAL HIGH (ref 80.0–100.0)
Platelets: 132 10*3/uL — ABNORMAL LOW (ref 150–400)
RBC: 2.67 MIL/uL — ABNORMAL LOW (ref 3.87–5.11)
RDW: 13.9 % (ref 11.5–15.5)
WBC: 8.3 10*3/uL (ref 4.0–10.5)
nRBC: 0 % (ref 0.0–0.2)

## 2021-11-18 LAB — BASIC METABOLIC PANEL
Anion gap: 5 (ref 5–15)
BUN: 17 mg/dL (ref 8–23)
CO2: 28 mmol/L (ref 22–32)
Calcium: 8 mg/dL — ABNORMAL LOW (ref 8.9–10.3)
Chloride: 103 mmol/L (ref 98–111)
Creatinine, Ser: 0.73 mg/dL (ref 0.44–1.00)
GFR, Estimated: >60 mL/min (ref >60)
Glucose, Bld: 123 mg/dL — ABNORMAL HIGH (ref 70–99)
Potassium: 4.3 mmol/L (ref 3.5–5.1)
Sodium: 136 mmol/L (ref 135–145)

## 2021-11-18 MED ORDER — MUPIROCIN 2 % EX OINT
TOPICAL_OINTMENT | Freq: Two times a day (BID) | CUTANEOUS | Status: DC
  Administered 2021-11-18 – 2021-11-22 (×3): 1 via NASAL
  Filled 2021-11-18 (×2): qty 22
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
## 2021-11-18 NOTE — Progress Notes (Signed)
OT Cancellation Note  Patient Details Name: [name] MRN: [id number] DOB: 1960/11/15   Cancelled Treatment:    Reason Eval/Treat Not Completed: Medical issues which prohibited therapy. Unstable c-spine fx pending fixation 2/10.  Will continue efforts as appropriate post ACDF.  Alayshia Marini D Wandalee Klang 11/18/2021, 11:05 AM
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
## 2021-11-19 ENCOUNTER — Encounter (HOSPITAL_COMMUNITY): Admission: EM | Disposition: A | Discharge status: Rehabilitation Facility | Payer: Self-pay | Source: Home / Self Care

## 2021-11-19 ENCOUNTER — Inpatient Hospital Stay (HOSPITAL_COMMUNITY): Payer: 59

## 2021-11-19 ENCOUNTER — Inpatient Hospital Stay (HOSPITAL_COMMUNITY): Payer: 59 | Admitting: Anesthesiology

## 2021-11-19 ENCOUNTER — Encounter (HOSPITAL_COMMUNITY): Payer: Self-pay

## 2021-11-19 ENCOUNTER — Other Ambulatory Visit: Payer: Self-pay

## 2021-11-19 DIAGNOSIS — S14107A Unspecified injury at C7 level of cervical spinal cord, initial encounter: Secondary | ICD-10-CM

## 2021-11-19 DIAGNOSIS — S13101A Dislocation of unspecified cervical vertebrae, initial encounter: Secondary | ICD-10-CM

## 2021-11-19 DIAGNOSIS — S129XXA Fracture of neck, unspecified, initial encounter: Secondary | ICD-10-CM

## 2021-11-19 DIAGNOSIS — S14106A Unspecified injury at C6 level of cervical spinal cord, initial encounter: Secondary | ICD-10-CM

## 2021-11-19 DIAGNOSIS — Z981 Arthrodesis status: Secondary | ICD-10-CM

## 2021-11-19 HISTORY — PX: ANTERIOR CERVICAL DECOMP/DISCECTOMY FUSION: SHX1161

## 2021-11-19 LAB — TYPE AND SCREEN
ABO/RH(D): A POS
Antibody Screen: NEGATIVE

## 2021-11-19 LAB — BASIC METABOLIC PANEL
Anion gap: 4 — ABNORMAL LOW (ref 5–15)
BUN: 14 mg/dL (ref 8–23)
CO2: 30 mmol/L (ref 22–32)
Calcium: 8.2 mg/dL — ABNORMAL LOW (ref 8.9–10.3)
Chloride: 101 mmol/L (ref 98–111)
Creatinine, Ser: 0.6 mg/dL (ref 0.44–1.00)
GFR, Estimated: >60 mL/min (ref >60)
Glucose, Bld: 142 mg/dL — ABNORMAL HIGH (ref 70–99)
Potassium: 4.5 mmol/L (ref 3.5–5.1)
Sodium: 135 mmol/L (ref 135–145)

## 2021-11-19 LAB — CBC
HCT: 31.1 % — ABNORMAL LOW (ref 36.0–46.0)
Hemoglobin: 9.6 g/dL — ABNORMAL LOW (ref 12.0–15.0)
MCH: 32.2 pg (ref 26.0–34.0)
MCHC: 30.9 g/dL (ref 30.0–36.0)
MCV: 104.4 fL — ABNORMAL HIGH (ref 80.0–100.0)
Platelets: 146 10*3/uL — ABNORMAL LOW (ref 150–400)
RBC: 2.98 MIL/uL — ABNORMAL LOW (ref 3.87–5.11)
RDW: 13.5 % (ref 11.5–15.5)
WBC: 9.1 10*3/uL (ref 4.0–10.5)
nRBC: 0 % (ref 0.0–0.2)

## 2021-11-19 LAB — ABO/RH: ABO/RH(D): A POS

## 2021-11-19 LAB — SURGICAL PCR SCREEN
MRSA, PCR: POSITIVE — AB
Staphylococcus aureus: POSITIVE — AB

## 2021-11-19 SURGERY — ANTERIOR CERVICAL DECOMPRESSION/DISCECTOMY FUSION 3 LEVELS
Anesthesia: General | Site: Spine Cervical

## 2021-11-19 MED ORDER — PROPOFOL 10 MG/ML IV BOLUS
INTRAVENOUS | Status: AC
  Filled 2021-11-19: qty 20

## 2021-11-19 MED ORDER — DEXAMETHASONE 4 MG PO TABS
4.0000 mg | ORAL_TABLET | Freq: Four times a day (QID) | ORAL | Status: DC
  Administered 2021-11-22 (×2): 4 mg via ORAL
  Filled 2021-11-19 (×2): qty 1

## 2021-11-19 MED ORDER — ACETYLCYSTEINE 20 % IN SOLN
3.0000 mL | Freq: Two times a day (BID) | RESPIRATORY_TRACT | Status: DC
  Administered 2021-11-19 – 2021-11-20 (×2): 3 mL via RESPIRATORY_TRACT
  Filled 2021-11-19 (×2): qty 4

## 2021-11-19 MED ORDER — SODIUM CHLORIDE 0.9% FLUSH: 3.0000 mL | INTRAVENOUS | Status: DC | PRN

## 2021-11-19 MED ORDER — METOPROLOL TARTRATE 5 MG/5ML IV SOLN: 5.0000 mg | Freq: Four times a day (QID) | INTRAVENOUS | Status: DC | PRN

## 2021-11-19 MED ORDER — FUROSEMIDE 10 MG/ML IJ SOLN
40.0000 mg | Freq: Once | INTRAMUSCULAR | Status: AC
  Administered 2021-11-19: 40 mg via INTRAVENOUS
  Filled 2021-11-19: qty 4

## 2021-11-19 MED ORDER — LIDOCAINE 2% (20 MG/ML) 5 ML SYRINGE
INTRAMUSCULAR | Status: AC
  Filled 2021-11-19: qty 10

## 2021-11-19 MED ORDER — BUPIVACAINE HCL (PF) 0.25 % IJ SOLN
INTRAMUSCULAR | Status: DC | PRN
  Administered 2021-11-19: 4 mL

## 2021-11-19 MED ORDER — ALBUTEROL SULFATE HFA 108 (90 BASE) MCG/ACT IN AERS
INHALATION_SPRAY | RESPIRATORY_TRACT | Status: DC | PRN
Start: 2021-11-19 — End: 2021-11-19
  Administered 2021-11-19: 6 via RESPIRATORY_TRACT

## 2021-11-19 MED ORDER — SENNA 8.6 MG PO TABS
1.0000 | ORAL_TABLET | Freq: Two times a day (BID) | ORAL | Status: DC
  Administered 2021-11-19 – 2021-11-23 (×6): 8.6 mg via ORAL
  Filled 2021-11-19 (×7): qty 1

## 2021-11-19 MED ORDER — ACETAMINOPHEN 650 MG RE SUPP: 650.0000 mg | RECTAL | Status: DC | PRN

## 2021-11-19 MED ORDER — THROMBIN 5000 UNITS EX SOLR
CUTANEOUS | Status: AC
  Filled 2021-11-19: qty 5000

## 2021-11-19 MED ORDER — HYDROMORPHONE HCL 1 MG/ML IJ SOLN
INTRAMUSCULAR | Status: DC | PRN
  Administered 2021-11-19: .5 mg via INTRAVENOUS

## 2021-11-19 MED ORDER — LIDOCAINE 2% (20 MG/ML) 5 ML SYRINGE
INTRAMUSCULAR | Status: DC | PRN
  Administered 2021-11-19: 100 mg via INTRAVENOUS

## 2021-11-19 MED ORDER — ESMOLOL HCL 100 MG/10ML IV SOLN
INTRAVENOUS | Status: DC | PRN
  Administered 2021-11-19: 20 mg via INTRAVENOUS

## 2021-11-19 MED ORDER — SODIUM CHLORIDE 0.9% FLUSH
3.0000 mL | Freq: Two times a day (BID) | INTRAVENOUS | Status: DC
  Administered 2021-11-19: 3 mL via INTRAVENOUS
  Administered 2021-11-20: 30 mL via INTRAVENOUS
  Administered 2021-11-20 – 2021-11-23 (×6): 3 mL via INTRAVENOUS

## 2021-11-19 MED ORDER — PHENYLEPHRINE 40 MCG/ML (10ML) SYRINGE FOR IV PUSH (FOR BLOOD PRESSURE SUPPORT)
PREFILLED_SYRINGE | INTRAVENOUS | Status: DC | PRN
  Administered 2021-11-19 (×2): 40 ug via INTRAVENOUS

## 2021-11-19 MED ORDER — DEXAMETHASONE SODIUM PHOSPHATE 10 MG/ML IJ SOLN
INTRAMUSCULAR | Status: AC
  Filled 2021-11-19: qty 1

## 2021-11-19 MED ORDER — ONDANSETRON HCL 4 MG/2ML IJ SOLN
INTRAMUSCULAR | Status: DC | PRN
  Administered 2021-11-19: 4 mg via INTRAVENOUS

## 2021-11-19 MED ORDER — CEFAZOLIN SODIUM-DEXTROSE 2-3 GM-%(50ML) IV SOLR
INTRAVENOUS | Status: DC | PRN
  Administered 2021-11-19: 2 g via INTRAVENOUS

## 2021-11-19 MED ORDER — MENTHOL 3 MG MT LOZG
1.0000 | LOZENGE | OROMUCOSAL | Status: DC | PRN
  Administered 2021-11-19: 3 mg via ORAL
  Filled 2021-11-19 (×2): qty 9

## 2021-11-19 MED ORDER — PROPOFOL 10 MG/ML IV BOLUS
INTRAVENOUS | Status: DC | PRN
  Administered 2021-11-19: 30 mg via INTRAVENOUS
  Administered 2021-11-19: 120 mg via INTRAVENOUS

## 2021-11-19 MED ORDER — POTASSIUM CHLORIDE IN NACL 20-0.9 MEQ/L-% IV SOLN
INTRAVENOUS | Status: DC
  Filled 2021-11-19: qty 1000

## 2021-11-19 MED ORDER — DEXAMETHASONE SODIUM PHOSPHATE 4 MG/ML IJ SOLN
4.0000 mg | Freq: Four times a day (QID) | INTRAMUSCULAR | Status: DC
  Administered 2021-11-19 – 2021-11-21 (×8): 4 mg via INTRAVENOUS
  Filled 2021-11-19 (×8): qty 1

## 2021-11-19 MED ORDER — CHLORHEXIDINE GLUCONATE 0.12 % MT SOLN
OROMUCOSAL | Status: AC
  Administered 2021-11-19: 15 mL via OROMUCOSAL
  Filled 2021-11-19: qty 15

## 2021-11-19 MED ORDER — CHLORHEXIDINE GLUCONATE 0.12 % MT SOLN: 15.0000 mL | Freq: Once | OROMUCOSAL | Status: AC

## 2021-11-19 MED ORDER — BUPIVACAINE HCL (PF) 0.25 % IJ SOLN
INTRAMUSCULAR | Status: AC
  Filled 2021-11-19: qty 30

## 2021-11-19 MED ORDER — SUGAMMADEX SODIUM 200 MG/2ML IV SOLN
INTRAVENOUS | Status: DC | PRN
  Administered 2021-11-19: 300 mg via INTRAVENOUS

## 2021-11-19 MED ORDER — FENTANYL CITRATE (PF) 250 MCG/5ML IJ SOLN
INTRAMUSCULAR | Status: DC | PRN
Start: 2021-11-19 — End: 2021-11-19
  Administered 2021-11-19: 150 ug via INTRAVENOUS

## 2021-11-19 MED ORDER — ROCURONIUM BROMIDE 10 MG/ML (PF) SYRINGE
PREFILLED_SYRINGE | INTRAVENOUS | Status: DC | PRN
  Administered 2021-11-19: 50 mg via INTRAVENOUS
  Administered 2021-11-19 (×2): 20 mg via INTRAVENOUS

## 2021-11-19 MED ORDER — DEXAMETHASONE SODIUM PHOSPHATE 10 MG/ML IJ SOLN
INTRAMUSCULAR | Status: DC | PRN
  Administered 2021-11-19: 10 mg via INTRAVENOUS

## 2021-11-19 MED ORDER — ONDANSETRON HCL 4 MG PO TABS: 4.0000 mg | ORAL_TABLET | Freq: Four times a day (QID) | ORAL | Status: DC | PRN

## 2021-11-19 MED ORDER — ONDANSETRON HCL 4 MG/2ML IJ SOLN: 4.0000 mg | Freq: Four times a day (QID) | INTRAMUSCULAR | Status: DC | PRN

## 2021-11-19 MED ORDER — ORAL CARE MOUTH RINSE: 15.0000 mL | Freq: Once | OROMUCOSAL | Status: AC

## 2021-11-19 MED ORDER — LACTATED RINGERS IV SOLN: INTRAVENOUS | Status: DC

## 2021-11-19 MED ORDER — ESMOLOL HCL 100 MG/10ML IV SOLN
INTRAVENOUS | Status: AC
  Filled 2021-11-19: qty 10

## 2021-11-19 MED ORDER — PHENYLEPHRINE HCL-NACL 20-0.9 MG/250ML-% IV SOLN
INTRAVENOUS | Status: DC | PRN
Start: 2021-11-19 — End: 2021-11-19
  Administered 2021-11-19: 40 ug/min via INTRAVENOUS

## 2021-11-19 MED ORDER — MORPHINE SULFATE (PF) 2 MG/ML IV SOLN
2.0000 mg | INTRAVENOUS | Status: DC | PRN
  Administered 2021-11-20 – 2021-11-22 (×9): 2 mg via INTRAVENOUS
  Filled 2021-11-19 (×10): qty 1

## 2021-11-19 MED ORDER — 0.9 % SODIUM CHLORIDE (POUR BTL) OPTIME
TOPICAL | Status: DC | PRN
  Administered 2021-11-19: 1000 mL

## 2021-11-19 MED ORDER — SODIUM CHLORIDE 0.9 % IV SOLN: 250.0000 mL | INTRAVENOUS | Status: DC

## 2021-11-19 MED ORDER — ROCURONIUM BROMIDE 10 MG/ML (PF) SYRINGE
PREFILLED_SYRINGE | INTRAVENOUS | Status: AC
  Filled 2021-11-19: qty 20

## 2021-11-19 MED ORDER — THROMBIN 5000 UNITS EX SOLR: OROMUCOSAL | Status: DC | PRN

## 2021-11-19 MED ORDER — FENTANYL CITRATE (PF) 250 MCG/5ML IJ SOLN
INTRAMUSCULAR | Status: AC
  Filled 2021-11-19: qty 5

## 2021-11-19 MED ORDER — PHENOL 1.4 % MT LIQD: 1.0000 | OROMUCOSAL | Status: DC | PRN

## 2021-11-19 MED ORDER — HYDROMORPHONE HCL 1 MG/ML IJ SOLN
INTRAMUSCULAR | Status: AC
  Filled 2021-11-19: qty 0.5

## 2021-11-19 MED ORDER — HYDROMORPHONE HCL 1 MG/ML IJ SOLN: 0.2500 mg | INTRAMUSCULAR | Status: DC | PRN

## 2021-11-19 MED ORDER — SUCCINYLCHOLINE CHLORIDE 200 MG/10ML IV SOSY
PREFILLED_SYRINGE | INTRAVENOUS | Status: DC | PRN
  Administered 2021-11-19: 120 mg via INTRAVENOUS

## 2021-11-19 MED ORDER — CEFAZOLIN SODIUM-DEXTROSE 2-4 GM/100ML-% IV SOLN
2.0000 g | Freq: Three times a day (TID) | INTRAVENOUS | Status: AC
  Administered 2021-11-19 – 2021-11-20 (×2): 2 g via INTRAVENOUS
  Filled 2021-11-19 (×2): qty 100

## 2021-11-19 MED ORDER — ACETAMINOPHEN 325 MG PO TABS
650.0000 mg | ORAL_TABLET | ORAL | Status: DC | PRN
  Administered 2021-11-22: 650 mg via ORAL
  Filled 2021-11-19: qty 2

## 2021-11-19 SURGICAL SUPPLY — 61 items
ADH SKN CLS LQ APL DERMABOND (GAUZE/BANDAGES/DRESSINGS) ×1
APL SKNCLS STERI-STRIP NONHPOA (GAUZE/BANDAGES/DRESSINGS) ×1
BAG COUNTER SPONGE SURGICOUNT (BAG) ×2 IMPLANT
BAG SPNG CNTER NS LX DISP (BAG) ×1
BAND INSRT 18 STRL LF DISP RB (MISCELLANEOUS) ×2
BAND RUBBER #18 3X1/16 STRL (MISCELLANEOUS) ×4 IMPLANT
BASKET BONE COLLECTION (BASKET) ×1 IMPLANT
BENZOIN TINCTURE PRP APPL 2/3 (GAUZE/BANDAGES/DRESSINGS) ×2 IMPLANT
BIT DRILL 2.3X12 (BIT) ×1 IMPLANT
BUR CARBIDE MATCH 3.0 (BURR) ×2 IMPLANT
CANISTER SUCT 3000ML PPV (MISCELLANEOUS) ×2 IMPLANT
DERMABOND ADHESIVE PROPEN (GAUZE/BANDAGES/DRESSINGS) ×1
DERMABOND ADVANCED .7 DNX6 (GAUZE/BANDAGES/DRESSINGS) IMPLANT
DRAIN JACKSON PRT FLT 7MM (DRAIN) ×1 IMPLANT
DRAPE C-ARM 42X72 X-RAY (DRAPES) ×4 IMPLANT
DRAPE LAPAROTOMY 100X72 PEDS (DRAPES) ×2 IMPLANT
DRAPE MICROSCOPE LEICA (MISCELLANEOUS) ×2 IMPLANT
DRSG OPSITE POSTOP 4X6 (GAUZE/BANDAGES/DRESSINGS) ×1 IMPLANT
DURAPREP 6ML APPLICATOR 50/CS (WOUND CARE) ×2 IMPLANT
ELECT COATED BLADE 2.86 ST (ELECTRODE) ×2 IMPLANT
ELECT REM PT RETURN 9FT ADLT (ELECTROSURGICAL) ×2
ELECTRODE REM PT RTRN 9FT ADLT (ELECTROSURGICAL) ×1 IMPLANT
EVACUATOR SILICONE 100CC (DRAIN) ×1 IMPLANT
GAUZE 4X4 16PLY ~~LOC~~+RFID DBL (SPONGE) ×1 IMPLANT
GLOVE SURG ENC MOIS LTX SZ7 (GLOVE) ×1 IMPLANT
GLOVE SURG ENC MOIS LTX SZ8 (GLOVE) ×2 IMPLANT
GLOVE SURG UNDER POLY LF SZ7 (GLOVE) ×1 IMPLANT
GOWN STRL REUS W/ TWL LRG LVL3 (GOWN DISPOSABLE) IMPLANT
GOWN STRL REUS W/ TWL XL LVL3 (GOWN DISPOSABLE) ×1 IMPLANT
GOWN STRL REUS W/TWL 2XL LVL3 (GOWN DISPOSABLE) IMPLANT
GOWN STRL REUS W/TWL LRG LVL3 (GOWN DISPOSABLE) ×2
GOWN STRL REUS W/TWL XL LVL3 (GOWN DISPOSABLE) ×4
HEMOSTAT POWDER KIT SURGIFOAM (HEMOSTASIS) ×2 IMPLANT
KIT BASIN OR (CUSTOM PROCEDURE TRAY) ×2 IMPLANT
KIT TURNOVER KIT B (KITS) ×2 IMPLANT
NDL HYPO 25X1 1.5 SAFETY (NEEDLE) ×1 IMPLANT
NDL SPNL 20GX3.5 QUINCKE YW (NEEDLE) ×1 IMPLANT
NEEDLE HYPO 25X1 1.5 SAFETY (NEEDLE) ×2 IMPLANT
NEEDLE SPNL 20GX3.5 QUINCKE YW (NEEDLE) ×2 IMPLANT
NS IRRIG 1000ML POUR BTL (IV SOLUTION) ×2 IMPLANT
PACK LAMINECTOMY NEURO (CUSTOM PROCEDURE TRAY) ×2 IMPLANT
PIN DISTRACTION 14MM (PIN) ×2 IMPLANT
PLATE ACP 62X3 LVL NS LF SPN (Plate) IMPLANT
PLATE ACP INSIGNIA 62 3L (Plate) ×2 IMPLANT
PUTTY DBM 5CC (Putty) ×1 IMPLANT
SCREW FA SINGLE LEAD 4.5X14 (Screw) ×2 IMPLANT
SCREW FA SINGLE LEAD 4X14 (Screw) ×2 IMPLANT
SCREW VA SINGLE LEAD 4X14 (Screw) ×8 IMPLANT
SCREW VA SINGLE LEAD 4X14 ST (Screw) IMPLANT
SPACER IDENT 9X16X14 7D (Spacer) IMPLANT
SPACER IDENTITI 7X16X14 7D (Spacer) ×1 IMPLANT
SPACER IDENTITI 8X16X14 7D (Spacer) ×1 IMPLANT
SPACER IDENTITI 9X16X14 7D (Spacer) ×2 IMPLANT
SPONGE INTESTINAL PEANUT (DISPOSABLE) ×2 IMPLANT
SPONGE SURGIFOAM ABS GEL 100 (HEMOSTASIS) IMPLANT
STRIP CLOSURE SKIN 1/2X4 (GAUZE/BANDAGES/DRESSINGS) ×2 IMPLANT
SUT VIC AB 3-0 SH 8-18 (SUTURE) ×2 IMPLANT
SUT VICRYL 4-0 PS2 18IN ABS (SUTURE) IMPLANT
TOWEL GREEN STERILE (TOWEL DISPOSABLE) ×2 IMPLANT
TOWEL GREEN STERILE FF (TOWEL DISPOSABLE) ×2 IMPLANT
WATER STERILE IRR 1000ML POUR (IV SOLUTION) ×2 IMPLANT

## 2021-11-19 NOTE — Anesthesia Procedure Notes (Signed)
Procedure Name: Intubation Date/Time: 11/19/2021 5:03 PM Performed by: Myna Bright, CRNA Pre-anesthesia Checklist: Patient identified, Emergency Drugs available, Suction available and Patient being monitored Patient Re-evaluated:Patient Re-evaluated prior to induction Oxygen Delivery Method: Circle system utilized Preoxygenation: Pre-oxygenation with 100% oxygen Induction Type: IV induction Ventilation: Mask ventilation without difficulty Laryngoscope Size: Glidescope and 3 Tube type: Oral Tube size: 7.0 mm Number of attempts: 1 Airway Equipment and Method: Video-laryngoscopy and Stylet Placement Confirmation: ETT inserted through vocal cords under direct vision, positive ETCO2 and breath sounds checked- equal and bilateral Secured at: 21 cm Tube secured with: Tape Dental Injury: Teeth and Oropharynx as per pre-operative assessment  Comments: Smooth IV induction. Easy mask airway. DL with Glidescope d/t unstable C-spine. C-collar remains in place and maintained neutral C-spine throughout intubation. Atraumatic oral intubation.
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
## 2021-11-19 NOTE — Anesthesia Postprocedure Evaluation (Signed)
Anesthesia Post Note  Patient: [name]  Procedure(s) Performed: CERVICAL FOUR-FIVE, CERVICAL FIVE-SIX, CERVICAL SIX-SEVEN ANTERIOR CERVICAL DECOMPRESSION/DISCECTOMY FUSION (Spine Cervical)     Patient location during evaluation: PACU Anesthesia Type: General Level of consciousness: sedated and patient cooperative Pain management: pain level controlled Vital Signs Assessment: post-procedure vital signs reviewed and stable Respiratory status: spontaneous breathing Cardiovascular status: stable Anesthetic complications: no   No notable events documented.  Last Vitals:  Vitals:   11/19/21 1957 11/19/21 2000  BP: (!) 140/59 (!) 140/59  Pulse: (!) 110 (!) 109  Resp: 13 13  Temp: 37.3 C   SpO2: 94% 94%    Last Pain:  Vitals:   11/19/21 1957  TempSrc:   PainSc: 0-No pain                 Nolon Nations

## 2021-11-19 NOTE — Progress Notes (Signed)
At 0400, SpO2 went down to the 70's on 2L [place], HR spiked up to the 150's, BP 200's/100's. Pt was answering questions but kept saying "I can't breathe" over and over. Placed pt on NRB, O2 went up to 100%. Notified MD and ordered a cxr. Orders for Lasix and to D/C continuous fluids. Will continue to monitor.
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
## 2021-11-19 NOTE — Anesthesia Preprocedure Evaluation (Signed)
Anesthesia Evaluation  Patient identified by MRN, date of birth, ID band Patient awake    Reviewed: Allergy & Precautions, NPO status , Patient's Chart, lab work & pertinent test results  Airway Mallampati: II  TM Distance: >3 FB     Dental   Pulmonary asthma , neg pneumonia , COPD, Current Smoker and Patient abstained from smoking.,    breath sounds clear to auscultation       Cardiovascular negative cardio ROS   Rhythm:Regular Rate:Normal     Neuro/Psych  Headaches, PSYCHIATRIC DISORDERS  Neuromuscular disease    GI/Hepatic Neg liver ROS, GERD  ,  Endo/Other  negative endocrine ROS  Renal/GU negative Renal ROS     Musculoskeletal   Abdominal   Peds  Hematology   Anesthesia Other Findings   Reproductive/Obstetrics                             Anesthesia Physical Anesthesia Plan  ASA: 3  Anesthesia Plan: General   Post-op Pain Management:    Induction:   PONV Risk Score and Plan: 2 and Ondansetron and Dexamethasone  Airway Management Planned: Oral ETT and Video Laryngoscope Planned  Additional Equipment:   Intra-op Plan:   Post-operative Plan: Possible Post-op intubation/ventilation  Informed Consent: I have reviewed the patients History and Physical, chart, labs and discussed the procedure including the risks, benefits and alternatives for the proposed anesthesia with the patient or authorized representative who has indicated his/her understanding and acceptance.     Dental advisory given  Plan Discussed with: CRNA and Anesthesiologist  Anesthesia Plan Comments:         Anesthesia Quick Evaluation

## 2021-11-19 NOTE — Transfer of Care (Signed)
Immediate Anesthesia Transfer of Care Note  Patient: [name]  Procedure(s) Performed: CERVICAL FOUR-FIVE, CERVICAL FIVE-SIX, CERVICAL SIX-SEVEN ANTERIOR CERVICAL DECOMPRESSION/DISCECTOMY FUSION (Spine Cervical)  Patient Location: PACU  Anesthesia Type:General  Level of Consciousness: awake, alert , oriented and patient cooperative  Airway & Oxygen Therapy: Patient Spontanous Breathing and Patient connected to face mask oxygen  Post-op Assessment: Report given to RN and Post -op Vital signs reviewed and stable  Post vital signs: Reviewed and stable  Last Vitals:  Vitals Value Taken Time  BP 136/58 11/19/21 1930  Temp    Pulse 118 11/19/21 1937  Resp 18 11/19/21 1937  SpO2 92 % 11/19/21 1937  Vitals shown include unvalidated device data.  Last Pain:  Vitals:   11/19/21 1433  TempSrc: Oral  PainSc: 9          Complications: No notable events documented.
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
## 2021-11-19 NOTE — Progress Notes (Signed)
Trauma Event Note   TRN rounded on patient shortly after return from OR, patient drowsy but responsive to name. Checked in with primary RN, no needs from trauma at this time.   Last imported Vital Signs BP (!) 140/59    Pulse (!) 109    Temp 99.2 F (37.3 C)    Resp 13    Ht 5\' 7"  (1.702 m)    Wt 135 lb (61.2 kg)    SpO2 94%    BMI 21.14 kg/m   Trending CBC Recent Labs    11/17/21 0515 11/18/21 0711 11/19/21 0417  WBC 16.3* 8.3 9.1  HGB 10.6* 8.4* 9.6*  HCT 32.6* 28.1* 31.1*  PLT 216 132* 146*    Trending Coag's No results for input(s): APTT, INR in the last 72 hours.  Trending BMET Recent Labs    11/17/21 0515 11/18/21 0711 11/19/21 0417  NA 136 136 135  K 4.7 4.3 4.5  CL 105 103 101  CO2 26 28 30   BUN 13 17 14   CREATININE 0.67 0.73 0.60  GLUCOSE 133* 123* 142*      Elvis Laufer O Melanee Cordial  Trauma Response RN  Please call TRN at [phone] for further assistance.

## 2021-11-20 ENCOUNTER — Inpatient Hospital Stay (HOSPITAL_COMMUNITY): Payer: 59

## 2021-11-20 LAB — BASIC METABOLIC PANEL
Anion gap: 7 (ref 5–15)
BUN: 11 mg/dL (ref 8–23)
CO2: 35 mmol/L — ABNORMAL HIGH (ref 22–32)
Calcium: 7.9 mg/dL — ABNORMAL LOW (ref 8.9–10.3)
Chloride: 95 mmol/L — ABNORMAL LOW (ref 98–111)
Creatinine, Ser: 0.5 mg/dL (ref 0.44–1.00)
GFR, Estimated: >60 mL/min (ref >60)
Glucose, Bld: 137 mg/dL — ABNORMAL HIGH (ref 70–99)
Potassium: 4.2 mmol/L (ref 3.5–5.1)
Sodium: 137 mmol/L (ref 135–145)

## 2021-11-20 LAB — CBC
HCT: 28.3 % — ABNORMAL LOW (ref 36.0–46.0)
Hemoglobin: 9.1 g/dL — ABNORMAL LOW (ref 12.0–15.0)
MCH: 32.2 pg (ref 26.0–34.0)
MCHC: 32.2 g/dL (ref 30.0–36.0)
MCV: 100 fL (ref 80.0–100.0)
Platelets: 144 10*3/uL — ABNORMAL LOW (ref 150–400)
RBC: 2.83 MIL/uL — ABNORMAL LOW (ref 3.87–5.11)
RDW: 12.8 % (ref 11.5–15.5)
WBC: 7.4 10*3/uL (ref 4.0–10.5)
nRBC: 0 % (ref 0.0–0.2)

## 2021-11-20 MED ORDER — FUROSEMIDE 10 MG/ML IJ SOLN
40.0000 mg | Freq: Two times a day (BID) | INTRAMUSCULAR | Status: DC
  Administered 2021-11-20 – 2021-11-21 (×2): 40 mg via INTRAVENOUS
  Filled 2021-11-20 (×2): qty 4

## 2021-11-20 MED ORDER — LORAZEPAM 2 MG/ML IJ SOLN
1.0000 mg | INTRAMUSCULAR | Status: DC | PRN
  Administered 2021-11-20: 2 mg via INTRAVENOUS
  Administered 2021-11-21: 1 mg via INTRAVENOUS
  Filled 2021-11-20: qty 1

## 2021-11-20 MED ORDER — LORAZEPAM 2 MG/ML IJ SOLN
INTRAMUSCULAR | Status: AC
  Filled 2021-11-20: qty 1

## 2021-11-20 MED ORDER — METHOCARBAMOL 1000 MG/10ML IJ SOLN
1000.0000 mg | Freq: Three times a day (TID) | INTRAVENOUS | Status: DC | PRN
  Administered 2021-11-20: 1000 mg via INTRAVENOUS
  Filled 2021-11-20: qty 10

## 2021-11-20 MED ORDER — FUROSEMIDE 10 MG/ML IJ SOLN
INTRAMUSCULAR | Status: AC
  Administered 2021-11-20: 40 mg
  Filled 2021-11-20: qty 4

## 2021-11-20 NOTE — Progress Notes (Addendum)
1915 Pt received by bedside shift report, A&Ox4, fidgeting in bed, asking to be repositioned multiple times, kicking off blankets, then wants to be covered up, c/o pain, asking for her mashed potatoes and ice cream. Per day shift RN patient had been very fidgety throughout the day. Rns repositioned patient, fresh drink provided, informed pt we are waiting for dinner tray. Fall precautions in place, [facility].  1930 Pt ringing call bell asking for RN to be repositioned and pulled up in the bed. RN repositioned patient, fixed suction and assessed pt, see flow sheet. RN assisted pt to sit up in bed and assisted with feeding pt dinner. NAD, pt seems content to be eating, WCTM.   2015 Pt calling on call bed, asking for pain meds. Medicated per MAR. Pt asking RN to help call family members on phone, RN assisted and updated family members. Oxygen sat dropping, pt changed to HFNC. WCTM.  2100 Pt calling out in pain, medicated per [facility].   2145 Pt still very fidgety. RN asked pt if she takes any anxiety meds. Pt stated she takes her brothers xanax. Pt stated she doesn't have a prescription because the MD stated it causes dementia and wouldn't give her any. Pt stated she's been taking xanax for the last 20+ years. RN paged MD for anxiety meds, new order received.   2200 Pt calling out RN's name repeatedly, ringing call bell, numerous times. Informed pt that RN was getting meds and will return. RN medicated per MAR. WCTM.   2215 Pt again calling out for RN, pt repositioned, RN provided reassurance, pt playing on phone, [place].   2230 Pt's granddaughter Lonn Georgia called for an update. Updated on POC, medications. Per Lonn Georgia grandmother only takes xanax and smokes. WCTM.   2315 Pt sleeping comfortably, NAD, fall precautions in place, WCTM.   0245 Pt ringing call bell, RN at bedside assisted the patient to reposition. Pt c/o pain. Medicated per MAR. WCTM.   0430 Pt sleeping comfortably, easy to arouse, NAD, WCTM.   0630  Pt ringing out for RN, stating she's having an anxiety attack. RN attempted to try to calm pt down, didn't work, O2 dropping, medicated per MAR. Oxygen turned up to 10L HFNC. WCTM.   0700 Bedside shift report, pt's O2 low 90s, NAD, fall precautions in place.

## 2021-11-20 NOTE — Progress Notes (Signed)
Inpatient Rehab Admissions Coordinator Note:   Per PT/OT patient was screened for CIR candidacy by Lenora Gomes Danford Bad, CCC-SLP. At this time, pt appears to be a potential candidate for CIR. I will place an order for rehab consult for full assessment, per our protocol.  Please contact me any with questions.Gayland Curry, [place], [place] Admissions Coordinator [phone] 11/20/21 6:47 PM
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
## 2021-11-21 ENCOUNTER — Inpatient Hospital Stay (HOSPITAL_COMMUNITY): Payer: 59

## 2021-11-21 LAB — BASIC METABOLIC PANEL
Anion gap: 8 (ref 5–15)
BUN: 13 mg/dL (ref 8–23)
CO2: 37 mmol/L — ABNORMAL HIGH (ref 22–32)
Calcium: 8.4 mg/dL — ABNORMAL LOW (ref 8.9–10.3)
Chloride: 93 mmol/L — ABNORMAL LOW (ref 98–111)
Creatinine, Ser: 0.51 mg/dL (ref 0.44–1.00)
GFR, Estimated: >60 mL/min (ref >60)
Glucose, Bld: 142 mg/dL — ABNORMAL HIGH (ref 70–99)
Potassium: 4.1 mmol/L (ref 3.5–5.1)
Sodium: 138 mmol/L (ref 135–145)

## 2021-11-21 LAB — CBC
HCT: 28 % — ABNORMAL LOW (ref 36.0–46.0)
Hemoglobin: 9.3 g/dL — ABNORMAL LOW (ref 12.0–15.0)
MCH: 33 pg (ref 26.0–34.0)
MCHC: 33.2 g/dL (ref 30.0–36.0)
MCV: 99.3 fL (ref 80.0–100.0)
Platelets: 201 10*3/uL (ref 150–400)
RBC: 2.82 MIL/uL — ABNORMAL LOW (ref 3.87–5.11)
RDW: 12.8 % (ref 11.5–15.5)
WBC: 12.1 10*3/uL — ABNORMAL HIGH (ref 4.0–10.5)
nRBC: 0 % (ref 0.0–0.2)

## 2021-11-21 MED ORDER — ALPRAZOLAM 0.25 MG PO TABS
1.0000 mg | ORAL_TABLET | Freq: Three times a day (TID) | ORAL | Status: DC | PRN
  Administered 2021-11-21 – 2021-11-23 (×7): 1 mg via ORAL
  Filled 2021-11-21 (×2): qty 2
  Filled 2021-11-21: qty 4
  Filled 2021-11-21: qty 2
  Filled 2021-11-21: qty 4
  Filled 2021-11-21 (×2): qty 2

## 2021-11-21 MED ORDER — ACETAZOLAMIDE SODIUM 500 MG IJ SOLR
500.0000 mg | Freq: Four times a day (QID) | INTRAMUSCULAR | Status: AC
  Administered 2021-11-21 – 2021-11-22 (×3): 500 mg via INTRAVENOUS
  Filled 2021-11-21 (×4): qty 500

## 2021-11-21 MED ORDER — ENOXAPARIN SODIUM 30 MG/0.3ML IJ SOSY
30.0000 mg | PREFILLED_SYRINGE | Freq: Two times a day (BID) | INTRAMUSCULAR | Status: DC
  Administered 2021-11-21 – 2021-11-23 (×5): 30 mg via SUBCUTANEOUS
  Filled 2021-11-21 (×5): qty 0.3

## 2021-11-21 MED ORDER — BISACODYL 10 MG RE SUPP: 10.0000 mg | Freq: Every day | RECTAL | Status: DC | PRN

## 2021-11-21 MED ORDER — POLYETHYLENE GLYCOL 3350 17 G PO PACK
17.0000 g | PACK | Freq: Every day | ORAL | Status: DC
  Administered 2021-11-21 – 2021-11-22 (×2): 17 g via ORAL
  Filled 2021-11-21 (×2): qty 1
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
## 2021-11-22 ENCOUNTER — Inpatient Hospital Stay (HOSPITAL_COMMUNITY): Payer: 59

## 2021-11-22 LAB — BASIC METABOLIC PANEL
Anion gap: 7 (ref 5–15)
BUN: 24 mg/dL — ABNORMAL HIGH (ref 8–23)
CO2: 26 mmol/L (ref 22–32)
Calcium: 8.7 mg/dL — ABNORMAL LOW (ref 8.9–10.3)
Chloride: 102 mmol/L (ref 98–111)
Creatinine, Ser: 0.68 mg/dL (ref 0.44–1.00)
GFR, Estimated: >60 mL/min (ref >60)
Glucose, Bld: 155 mg/dL — ABNORMAL HIGH (ref 70–99)
Potassium: 3.8 mmol/L (ref 3.5–5.1)
Sodium: 135 mmol/L (ref 135–145)

## 2021-11-22 LAB — CBC
HCT: 29.6 % — ABNORMAL LOW (ref 36.0–46.0)
Hemoglobin: 9.6 g/dL — ABNORMAL LOW (ref 12.0–15.0)
MCH: 32.4 pg (ref 26.0–34.0)
MCHC: 32.4 g/dL (ref 30.0–36.0)
MCV: 100 fL (ref 80.0–100.0)
Platelets: 265 10*3/uL (ref 150–400)
RBC: 2.96 MIL/uL — ABNORMAL LOW (ref 3.87–5.11)
RDW: 13.1 % (ref 11.5–15.5)
WBC: 12.8 10*3/uL — ABNORMAL HIGH (ref 4.0–10.5)
nRBC: 0 % (ref 0.0–0.2)

## 2021-11-22 MED ORDER — MAGNESIUM HYDROXIDE 400 MG/5ML PO SUSP
30.0000 mL | Freq: Every day | ORAL | Status: DC
  Administered 2021-11-22: 30 mL via ORAL
  Filled 2021-11-22 (×2): qty 30

## 2021-11-22 MED ORDER — ACETAZOLAMIDE SODIUM 500 MG IJ SOLR
500.0000 mg | Freq: Four times a day (QID) | INTRAMUSCULAR | Status: AC
  Administered 2021-11-22 (×2): 500 mg via INTRAVENOUS
  Filled 2021-11-22 (×2): qty 500

## 2021-11-22 MED ORDER — MORPHINE SULFATE (PF) 2 MG/ML IV SOLN: 2.0000 mg | INTRAVENOUS | Status: DC | PRN

## 2021-11-22 MED ORDER — STERILE WATER FOR INJECTION IJ SOLN
INTRAMUSCULAR | Status: AC
  Administered 2021-11-22: 5 mL
  Filled 2021-11-22: qty 10

## 2021-11-22 MED ORDER — POLYETHYLENE GLYCOL 3350 17 G PO PACK
17.0000 g | PACK | Freq: Two times a day (BID) | ORAL | Status: DC
  Administered 2021-11-23: 17 g via ORAL
  Filled 2021-11-22 (×2): qty 1

## 2021-11-22 MED ORDER — ACETAMINOPHEN 500 MG PO TABS
1000.0000 mg | ORAL_TABLET | Freq: Four times a day (QID) | ORAL | Status: DC
  Administered 2021-11-22 – 2021-11-23 (×5): 1000 mg via ORAL
  Filled 2021-11-22 (×5): qty 2

## 2021-11-22 MED ORDER — BISACODYL 10 MG RE SUPP
10.0000 mg | Freq: Every day | RECTAL | Status: DC
  Administered 2021-11-22: 10 mg via RECTAL
  Filled 2021-11-22: qty 1

## 2021-11-22 MED ORDER — METHOCARBAMOL 500 MG PO TABS
1000.0000 mg | ORAL_TABLET | Freq: Three times a day (TID) | ORAL | Status: DC
  Administered 2021-11-22 – 2021-11-23 (×4): 1000 mg via ORAL
  Filled 2021-11-22 (×4): qty 2
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
## 2021-11-22 NOTE — Progress Notes (Signed)
She as some L shoulder pain, collar in place, No NTW arms, using hands well, overall seems ok from surgery. Dressing dry and flat

## 2021-11-22 NOTE — Progress Notes (Signed)
Inpatient Rehab Admissions Coordinator:   I spoke with Pt. Regarding potential CIR admit. Pt. States interest and that her daughter and granddaughter can provide support. I will open a case with her insurance and reach out to her daughter to confirm support. Please contact me with any questions.  Clemens Catholic, [place], [place] Admissions Coordinator  [phone] ([place]) [phone] (office)
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
## 2021-11-23 ENCOUNTER — Other Ambulatory Visit: Payer: Self-pay

## 2021-11-23 ENCOUNTER — Inpatient Hospital Stay (HOSPITAL_COMMUNITY): Payer: 59

## 2021-11-23 ENCOUNTER — Encounter (HOSPITAL_COMMUNITY): Payer: Self-pay | Admitting: Physical Medicine & Rehabilitation

## 2021-11-23 ENCOUNTER — Encounter (HOSPITAL_COMMUNITY): Payer: Self-pay

## 2021-11-23 ENCOUNTER — Inpatient Hospital Stay (HOSPITAL_COMMUNITY)
Admission: RE | Admit: 2021-11-23 | Discharge: 2021-12-08 | DRG: 560 | Disposition: A | Discharge status: Home / Self Care | Payer: 59 | Source: Intra-hospital | Attending: Physical Medicine & Rehabilitation | Admitting: Physical Medicine & Rehabilitation

## 2021-11-23 DIAGNOSIS — J439 Emphysema, unspecified: Secondary | ICD-10-CM | POA: Diagnosis present

## 2021-11-23 DIAGNOSIS — R0902 Hypoxemia: Secondary | ICD-10-CM | POA: Diagnosis not present

## 2021-11-23 DIAGNOSIS — Z79899 Other long term (current) drug therapy: Secondary | ICD-10-CM | POA: Diagnosis not present

## 2021-11-23 DIAGNOSIS — M545 Low back pain, unspecified: Secondary | ICD-10-CM | POA: Diagnosis not present

## 2021-11-23 DIAGNOSIS — Z91199 Patient's noncompliance with other medical treatment and regimen due to unspecified reason: Secondary | ICD-10-CM

## 2021-11-23 DIAGNOSIS — S129XXS Fracture of neck, unspecified, sequela: Secondary | ICD-10-CM | POA: Diagnosis not present

## 2021-11-23 DIAGNOSIS — R339 Retention of urine, unspecified: Secondary | ICD-10-CM | POA: Diagnosis not present

## 2021-11-23 DIAGNOSIS — T1490XA Injury, unspecified, initial encounter: Secondary | ICD-10-CM | POA: Diagnosis present

## 2021-11-23 DIAGNOSIS — D62 Acute posthemorrhagic anemia: Secondary | ICD-10-CM | POA: Diagnosis present

## 2021-11-23 DIAGNOSIS — S2249XA Multiple fractures of ribs, unspecified side, initial encounter for closed fracture: Secondary | ICD-10-CM

## 2021-11-23 DIAGNOSIS — Z886 Allergy status to analgesic agent status: Secondary | ICD-10-CM | POA: Diagnosis not present

## 2021-11-23 DIAGNOSIS — Z90722 Acquired absence of ovaries, bilateral: Secondary | ICD-10-CM | POA: Diagnosis not present

## 2021-11-23 DIAGNOSIS — R64 Cachexia: Secondary | ICD-10-CM | POA: Diagnosis present

## 2021-11-23 DIAGNOSIS — F17210 Nicotine dependence, cigarettes, uncomplicated: Secondary | ICD-10-CM | POA: Diagnosis present

## 2021-11-23 DIAGNOSIS — S129XXD Fracture of neck, unspecified, subsequent encounter: Principal | ICD-10-CM

## 2021-11-23 DIAGNOSIS — S42212D Unspecified displaced fracture of surgical neck of left humerus, subsequent encounter for fracture with routine healing: Secondary | ICD-10-CM

## 2021-11-23 DIAGNOSIS — F410 Panic disorder [episodic paroxysmal anxiety] without agoraphobia: Secondary | ICD-10-CM | POA: Diagnosis present

## 2021-11-23 DIAGNOSIS — E8770 Fluid overload, unspecified: Secondary | ICD-10-CM | POA: Diagnosis present

## 2021-11-23 DIAGNOSIS — Z981 Arthrodesis status: Secondary | ICD-10-CM | POA: Diagnosis not present

## 2021-11-23 DIAGNOSIS — F411 Generalized anxiety disorder: Secondary | ICD-10-CM

## 2021-11-23 DIAGNOSIS — Z09 Encounter for follow-up examination after completed treatment for conditions other than malignant neoplasm: Secondary | ICD-10-CM

## 2021-11-23 DIAGNOSIS — F17200 Nicotine dependence, unspecified, uncomplicated: Secondary | ICD-10-CM | POA: Diagnosis present

## 2021-11-23 DIAGNOSIS — K219 Gastro-esophageal reflux disease without esophagitis: Secondary | ICD-10-CM | POA: Diagnosis present

## 2021-11-23 DIAGNOSIS — M792 Neuralgia and neuritis, unspecified: Secondary | ICD-10-CM

## 2021-11-23 DIAGNOSIS — S2243XD Multiple fractures of ribs, bilateral, subsequent encounter for fracture with routine healing: Secondary | ICD-10-CM | POA: Diagnosis not present

## 2021-11-23 DIAGNOSIS — Z6820 Body mass index (BMI) 20.0-20.9, adult: Secondary | ICD-10-CM | POA: Diagnosis not present

## 2021-11-23 DIAGNOSIS — F419 Anxiety disorder, unspecified: Secondary | ICD-10-CM | POA: Diagnosis not present

## 2021-11-23 LAB — CBC
HCT: 31.6 % — ABNORMAL LOW (ref 36.0–46.0)
Hemoglobin: 9.9 g/dL — ABNORMAL LOW (ref 12.0–15.0)
MCH: 31.4 pg (ref 26.0–34.0)
MCHC: 31.3 g/dL (ref 30.0–36.0)
MCV: 100.3 fL — ABNORMAL HIGH (ref 80.0–100.0)
Platelets: 334 10*3/uL (ref 150–400)
RBC: 3.15 MIL/uL — ABNORMAL LOW (ref 3.87–5.11)
RDW: 13.4 % (ref 11.5–15.5)
WBC: 8.1 10*3/uL (ref 4.0–10.5)
nRBC: 0 % (ref 0.0–0.2)

## 2021-11-23 LAB — BASIC METABOLIC PANEL
Anion gap: 7 (ref 5–15)
BUN: 29 mg/dL — ABNORMAL HIGH (ref 8–23)
CO2: 26 mmol/L (ref 22–32)
Calcium: 9.1 mg/dL (ref 8.9–10.3)
Chloride: 103 mmol/L (ref 98–111)
Creatinine, Ser: 0.7 mg/dL (ref 0.44–1.00)
GFR, Estimated: >60 mL/min (ref >60)
Glucose, Bld: 129 mg/dL — ABNORMAL HIGH (ref 70–99)
Potassium: 3.5 mmol/L (ref 3.5–5.1)
Sodium: 136 mmol/L (ref 135–145)

## 2021-11-23 MED ORDER — METHOCARBAMOL 500 MG PO TABS
500.0000 mg | ORAL_TABLET | Freq: Four times a day (QID) | ORAL | Status: DC | PRN
  Administered 2021-11-24 – 2021-12-08 (×36): 500 mg via ORAL
  Filled 2021-11-23 (×37): qty 1

## 2021-11-23 MED ORDER — ENOXAPARIN SODIUM 40 MG/0.4ML IJ SOSY
40.0000 mg | PREFILLED_SYRINGE | INTRAMUSCULAR | Status: DC
  Administered 2021-11-24: 40 mg via SUBCUTANEOUS
  Filled 2021-11-23: qty 0.4

## 2021-11-23 MED ORDER — GUAIFENESIN 100 MG/5ML PO LIQD
15.0000 mL | Freq: Four times a day (QID) | ORAL | Status: DC
  Administered 2021-11-23 (×2): 15 mL via ORAL
  Filled 2021-11-23 (×2): qty 20

## 2021-11-23 MED ORDER — POLYETHYLENE GLYCOL 3350 17 G PO PACK
17.0000 g | PACK | Freq: Two times a day (BID) | ORAL | Status: DC
  Administered 2021-11-23 – 2021-12-08 (×23): 17 g via ORAL
  Filled 2021-11-23 (×26): qty 1

## 2021-11-23 MED ORDER — PROCHLORPERAZINE EDISYLATE 10 MG/2ML IJ SOLN: 5.0000 mg | Freq: Four times a day (QID) | INTRAMUSCULAR | Status: DC | PRN

## 2021-11-23 MED ORDER — TAMSULOSIN HCL 0.4 MG PO CAPS
0.4000 mg | ORAL_CAPSULE | Freq: Every day | ORAL | Status: DC
  Administered 2021-11-23: 0.4 mg via ORAL
  Filled 2021-11-23: qty 1

## 2021-11-23 MED ORDER — ACETAMINOPHEN 325 MG PO TABS
325.0000 mg | ORAL_TABLET | ORAL | Status: DC | PRN
  Administered 2021-11-24 – 2021-11-29 (×3): 325 mg via ORAL
  Filled 2021-11-23 (×4): qty 1

## 2021-11-23 MED ORDER — BISACODYL 10 MG RE SUPP: 10.0000 mg | Freq: Every day | RECTAL | Status: DC | PRN

## 2021-11-23 MED ORDER — SENNOSIDES-DOCUSATE SODIUM 8.6-50 MG PO TABS
2.0000 | ORAL_TABLET | Freq: Every day | ORAL | Status: DC
  Administered 2021-11-23 – 2021-12-07 (×15): 2 via ORAL
  Filled 2021-11-23 (×15): qty 2

## 2021-11-23 MED ORDER — GUAIFENESIN-DM 100-10 MG/5ML PO SYRP: 5.0000 mL | ORAL_SOLUTION | Freq: Four times a day (QID) | ORAL | Status: DC | PRN

## 2021-11-23 MED ORDER — PROCHLORPERAZINE 25 MG RE SUPP: 12.5000 mg | Freq: Four times a day (QID) | RECTAL | Status: DC | PRN

## 2021-11-23 MED ORDER — ACETAMINOPHEN 325 MG PO TABS
650.0000 mg | ORAL_TABLET | Freq: Three times a day (TID) | ORAL | Status: DC
  Administered 2021-11-24 – 2021-12-08 (×57): 650 mg via ORAL
  Filled 2021-11-23 (×57): qty 2

## 2021-11-23 MED ORDER — PROCHLORPERAZINE MALEATE 5 MG PO TABS: 5.0000 mg | ORAL_TABLET | Freq: Four times a day (QID) | ORAL | Status: DC | PRN

## 2021-11-23 MED ORDER — GUAIFENESIN 100 MG/5ML PO LIQD
15.0000 mL | Freq: Four times a day (QID) | ORAL | Status: DC
  Administered 2021-11-24 – 2021-12-08 (×53): 15 mL via ORAL
  Filled 2021-11-23 (×56): qty 15

## 2021-11-23 MED ORDER — FLEET ENEMA 7-19 GM/118ML RE ENEM: 1.0000 | ENEMA | Freq: Once | RECTAL | Status: DC | PRN

## 2021-11-23 MED ORDER — IPRATROPIUM-ALBUTEROL 0.5-2.5 (3) MG/3ML IN SOLN
3.0000 mL | Freq: Four times a day (QID) | RESPIRATORY_TRACT | Status: DC
  Administered 2021-11-24: 3 mL via RESPIRATORY_TRACT
  Filled 2021-11-23 (×3): qty 3

## 2021-11-23 MED ORDER — DIPHENHYDRAMINE HCL 12.5 MG/5ML PO ELIX
12.5000 mg | ORAL_SOLUTION | Freq: Four times a day (QID) | ORAL | Status: DC | PRN
  Administered 2021-11-30: 12.5 mg via ORAL
  Filled 2021-11-23: qty 10

## 2021-11-23 MED ORDER — POLYETHYLENE GLYCOL 3350 17 G PO PACK
17.0000 g | PACK | Freq: Every day | ORAL | Status: DC | PRN
  Administered 2021-11-25: 17 g via ORAL

## 2021-11-23 MED ORDER — TRAZODONE HCL 50 MG PO TABS
25.0000 mg | ORAL_TABLET | Freq: Every evening | ORAL | Status: DC | PRN
  Administered 2021-11-24 – 2021-12-06 (×7): 50 mg via ORAL
  Filled 2021-11-23 (×8): qty 1

## 2021-11-23 MED ORDER — TAMSULOSIN HCL 0.4 MG PO CAPS
0.4000 mg | ORAL_CAPSULE | Freq: Every day | ORAL | Status: DC
  Administered 2021-11-24 – 2021-12-07 (×14): 0.4 mg via ORAL
  Filled 2021-11-23 (×14): qty 1

## 2021-11-23 MED ORDER — ALPRAZOLAM 0.25 MG PO TABS
0.2500 mg | ORAL_TABLET | Freq: Three times a day (TID) | ORAL | Status: DC | PRN
  Administered 2021-11-24 – 2021-12-08 (×39): 0.25 mg via ORAL
  Filled 2021-11-23 (×40): qty 1

## 2021-11-23 MED ORDER — BOOST / RESOURCE BREEZE PO LIQD CUSTOM
1.0000 | Freq: Three times a day (TID) | ORAL | Status: DC
  Administered 2021-11-23 – 2021-12-08 (×22): 1 via ORAL

## 2021-11-23 MED ORDER — OXYCODONE HCL 5 MG PO TABS
5.0000 mg | ORAL_TABLET | ORAL | Status: DC | PRN
  Administered 2021-11-23 – 2021-11-24 (×5): 10 mg via ORAL
  Administered 2021-11-24: 5 mg via ORAL
  Administered 2021-11-25 – 2021-11-26 (×7): 10 mg via ORAL
  Administered 2021-11-26: 5 mg via ORAL
  Administered 2021-11-26 – 2021-12-01 (×25): 10 mg via ORAL
  Administered 2021-12-01: 5 mg via ORAL
  Administered 2021-12-01 – 2021-12-02 (×9): 10 mg via ORAL
  Administered 2021-12-03: 5 mg via ORAL
  Administered 2021-12-03 – 2021-12-06 (×17): 10 mg via ORAL
  Administered 2021-12-06: 5 mg via ORAL
  Administered 2021-12-06 – 2021-12-07 (×7): 10 mg via ORAL
  Filled 2021-11-23 (×77): qty 2

## 2021-11-23 MED ORDER — NICOTINE 14 MG/24HR TD PT24
14.0000 mg | MEDICATED_PATCH | Freq: Every day | TRANSDERMAL | Status: DC
  Administered 2021-11-24 – 2021-12-08 (×15): 14 mg via TRANSDERMAL
  Filled 2021-11-23 (×15): qty 1

## 2021-11-23 MED ORDER — ALUM & MAG HYDROXIDE-SIMETH 200-200-20 MG/5ML PO SUSP
30.0000 mL | ORAL | Status: DC | PRN
  Administered 2021-11-25 – 2021-11-26 (×2): 30 mL via ORAL
  Filled 2021-11-23 (×2): qty 30

## 2021-11-23 MED ORDER — MUPIROCIN 2 % EX OINT
1.0000 "application " | TOPICAL_OINTMENT | Freq: Two times a day (BID) | CUTANEOUS | Status: DC
  Administered 2021-11-23 – 2021-12-08 (×30): 1 via NASAL
  Filled 2021-11-23: qty 22

## 2021-11-23 MED ORDER — ACETAZOLAMIDE SODIUM 500 MG IJ SOLR
500.0000 mg | Freq: Four times a day (QID) | INTRAMUSCULAR | Status: AC
  Administered 2021-11-23 (×2): 500 mg via INTRAVENOUS
  Filled 2021-11-23 (×2): qty 500
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
## 2021-11-23 NOTE — Progress Notes (Signed)
Report called to CIR nurse. All questions answered. Pt focused assessment complete pt is stable. Pt anxious and tearful xanax administered per order. Pt and all belongings transported to 4W via bed without incident.

## 2021-11-23 NOTE — Progress Notes (Signed)
Inpatient Rehab Admissions Coordinator:   I have a bed for this Pt. On CIR today. Rn may call report to [phone] after 12pm  Clemens Catholic, [place], [place] Admissions Coordinator  [phone] (celll) [phone] (office)
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
## 2021-11-23 NOTE — TOC Transition Note (Signed)
Transition of Care [facility]) - CM/SW Discharge Note   Patient Details  Name: [name] MRN: [id number] Date of Birth: November 10, 1960  [facility]) CM/SW Contact:  Ella Bodo, RN Phone Number: 11/23/2021, 10:35 AM   Clinical Narrative:    Pt medically stable for dc and insurance authorization has been obtained for admission to acute inpatient rehab.  Plan dc to [facility] when bed is ready.    Final next level of care: IP Rehab Facility Barriers to Discharge: Barriers Resolved   Patient Goals and CMS Choice Patient states their goals for this hospitalization and ongoing recovery are:: to go home CMS Medicare.gov Compare Post Acute Care list provided to:: Patient Choice offered to / list presented to : Patient  Discharge Placement                       Discharge Plan and Services   Discharge Planning Services: CM Consult Post Acute Care Choice: IP Rehab                               Social Determinants of Health (SDOH) Interventions     Readmission Risk Interventions No flowsheet data found.  Reinaldo Raddle, RN, BSN  Trauma/Neuro ICU Case Manager [phone]

## 2021-11-23 NOTE — Progress Notes (Signed)
Attempted to assist pt to eat dinner tray. Pt declined states "I dont like that food". Encouraged pt to eat and educated on importance of PO intake pt continued to refused.

## 2021-11-23 NOTE — Progress Notes (Signed)
This RN made 3 attempts to collect sputum specimen pt unable to successfully cough up sputum for collection.

## 2021-11-23 NOTE — Progress Notes (Signed)
Patient arrived at 81. Unable to upload patient into system until 1845. Patient orders released. Patient understands how to  order dinner. Admission will be endorsed  to 7 PM-7 AM nurse.
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
## 2021-11-24 DIAGNOSIS — G8918 Other acute postprocedural pain: Secondary | ICD-10-CM

## 2021-11-24 LAB — CBC WITH DIFFERENTIAL/PLATELET
Abs Immature Granulocytes: 0.07 10*3/uL (ref 0.00–0.07)
Basophils Absolute: 0 10*3/uL (ref 0.0–0.1)
Basophils Relative: 0 %
Eosinophils Absolute: 0.2 10*3/uL (ref 0.0–0.5)
Eosinophils Relative: 1 %
HCT: 30.9 % — ABNORMAL LOW (ref 36.0–46.0)
Hemoglobin: 10 g/dL — ABNORMAL LOW (ref 12.0–15.0)
Immature Granulocytes: 1 %
Lymphocytes Relative: 12 %
Lymphs Abs: 1.4 10*3/uL (ref 0.7–4.0)
MCH: 31.9 pg (ref 26.0–34.0)
MCHC: 32.4 g/dL (ref 30.0–36.0)
MCV: 98.7 fL (ref 80.0–100.0)
Monocytes Absolute: 0.9 10*3/uL (ref 0.1–1.0)
Monocytes Relative: 8 %
Neutro Abs: 8.9 10*3/uL — ABNORMAL HIGH (ref 1.7–7.7)
Neutrophils Relative %: 78 %
Platelets: 379 10*3/uL (ref 150–400)
RBC: 3.13 MIL/uL — ABNORMAL LOW (ref 3.87–5.11)
RDW: 13.3 % (ref 11.5–15.5)
WBC: 11.5 10*3/uL — ABNORMAL HIGH (ref 4.0–10.5)
nRBC: 0 % (ref 0.0–0.2)

## 2021-11-24 LAB — COMPREHENSIVE METABOLIC PANEL
ALT: 19 U/L (ref 0–44)
AST: 11 U/L — ABNORMAL LOW (ref 15–41)
Albumin: 2.8 g/dL — ABNORMAL LOW (ref 3.5–5.0)
Alkaline Phosphatase: 67 U/L (ref 38–126)
Anion gap: 7 (ref 5–15)
BUN: 21 mg/dL (ref 8–23)
CO2: 26 mmol/L (ref 22–32)
Calcium: 8.7 mg/dL — ABNORMAL LOW (ref 8.9–10.3)
Chloride: 104 mmol/L (ref 98–111)
Creatinine, Ser: 0.58 mg/dL (ref 0.44–1.00)
GFR, Estimated: >60 mL/min (ref >60)
Glucose, Bld: 120 mg/dL — ABNORMAL HIGH (ref 70–99)
Potassium: 3.7 mmol/L (ref 3.5–5.1)
Sodium: 137 mmol/L (ref 135–145)
Total Bilirubin: 0.6 mg/dL (ref 0.3–1.2)
Total Protein: 6.1 g/dL — ABNORMAL LOW (ref 6.5–8.1)

## 2021-11-24 MED ORDER — ENOXAPARIN SODIUM 30 MG/0.3ML IJ SOSY
30.0000 mg | PREFILLED_SYRINGE | Freq: Two times a day (BID) | INTRAMUSCULAR | Status: DC
  Administered 2021-11-24 – 2021-12-08 (×28): 30 mg via SUBCUTANEOUS
  Filled 2021-11-24 (×28): qty 0.3

## 2021-11-24 MED ORDER — IPRATROPIUM-ALBUTEROL 0.5-2.5 (3) MG/3ML IN SOLN
3.0000 mL | Freq: Three times a day (TID) | RESPIRATORY_TRACT | Status: DC
  Administered 2021-11-24 – 2021-11-25 (×3): 3 mL via RESPIRATORY_TRACT
  Filled 2021-11-24 (×2): qty 3

## 2021-11-24 MED ORDER — ESCITALOPRAM OXALATE 10 MG PO TABS
5.0000 mg | ORAL_TABLET | Freq: Every day | ORAL | Status: DC
  Administered 2021-11-24 – 2021-12-07 (×11): 5 mg via ORAL
  Filled 2021-11-24 (×13): qty 1

## 2021-11-24 MED ORDER — ENSURE MAX PROTEIN PO LIQD
11.0000 [oz_av] | Freq: Every day | ORAL | Status: DC
  Administered 2021-11-24 – 2021-12-08 (×14): 11 [oz_av] via ORAL

## 2021-11-24 NOTE — Progress Notes (Signed)
Physical Therapy Session Note  Patient Details  Name: [name] MRN: [id number] Date of Birth: [birth year]  Today's Date: 11/24/2021 PT Missed Time: 51 Minutes Missed Time Reason: Unavailable (Comment) (breathing treatment)  Short Term Goals: Week 1:  PT Short Term Goal 1 (Week 1): Pt will perform bed mobility consistently with CGA. PT Short Term Goal 2 (Week 1): Pt will perform bed to chair consistnetly with CGA. PT Short Term Goal 3 (Week 1): Pt will perform sit to stand consistently with CGA. PT Short Term Goal 4 (Week 1): Pt will ambulate x100' with minA and LRAD. PT Short Term Goal 5 (Week 1): Pt will initiate stair training.  Skilled Therapeutic Interventions/Progress Updates:  Ambulation/gait training;Community reintegration;DME/adaptive equipment instruction;Neuromuscular re-education;Psychosocial support;Stair training;UE/LE Strength taining/ROM;Wheelchair propulsion/positioning;Balance/vestibular training;Discharge planning;Functional electrical stimulation;Pain management;Skin care/wound management;Therapeutic Activities;UE/LE Coordination activities;Cognitive remediation/compensation;Disease management/prevention;Functional mobility training;Patient/family education;Splinting/orthotics;Therapeutic Exercise;Visual/perceptual remediation/compensation   Pt receiving breathing treatment upon first PT attempt. On follow up, RN in room with pt and pt appearing very fatigued with washcloth over forehead. Will follow up as able.  Therapy Documentation Precautions:  Precautions Precautions: Fall, Back, Cervical Required Braces or Orthoses: Sling, Cervical Brace Cervical Brace: Hard collar, At all times Restrictions Weight Bearing Restrictions: Yes LUE Weight Bearing: Non weight bearing Other Position/Activity Restrictions: immobilizer sling General: PT Amount of Missed Time (min): 30 Minutes PT Missed Treatment Reason: Unavailable (Comment) (breathing  treatment)    Therapy/Group: Individual Therapy  Breck Coons, PT, DPT 11/24/2021, 4:05 PM

## 2021-11-24 NOTE — Progress Notes (Signed)
Inpatient Rehabilitation Care Coordinator Assessment and Plan Patient Details  Name: [name] MRN: [id number] Date of Birth: 06-Sep-1961  Today's Date: 11/24/2021  Hospital Problems: Principal Problem:   Trauma  Past Medical History:  Past Medical History:  Diagnosis Date   ASTHMA UNSPECIFIED WITH EXACERBATION 07/21/2010   CARPAL TUNNEL SYNDROME, BILATERAL 01/16/2008   COPD 05/08/2008   GERD 05/18/1693   HELICOBACTER PYLORI GASTRITIS, HX OF 04/18/2007   PANIC DISORDER 04/18/2007   TOBACCO ABUSE 07/17/2009   Past Surgical History:  Past Surgical History:  Procedure Laterality Date   ANTERIOR CERVICAL DECOMP/DISCECTOMY FUSION N/A 11/19/2021   Procedure: CERVICAL FOUR-FIVE, CERVICAL FIVE-SIX, CERVICAL SIX-SEVEN ANTERIOR CERVICAL DECOMPRESSION/DISCECTOMY FUSION;  Surgeon: Eustace Moore, MD;  Location: [facility];  Service: Neurosurgery;  Laterality: N/A;   APPENDECTOMY     OOPHORECTOMY     Social History:  reports that she has been smoking cigarettes. She has a 50.00 pack-year smoking history. She has never used smokeless tobacco. She reports that she does not drink alcohol and does not use drugs.  Family / Support Systems Marital Status: Divorced Patient Roles: Parent Spouse/Significant Other: Divorced Children: Dtr Probation officer Other Supports: [name] Anticipated Caregiver: Dtr [name] Ability/Limitations of Caregiver: No concerns reported Caregiver Availability: 24/7 Family Dynamics: Pt lives with her dtr and [name]- 59 and 63 y.o.  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 Education: some Medical sales representative - How often do you need to have someone help you when you read instructions, pamphlets, or other written material from your doctor or pharmacy?: Never Writes: Yes Employment Status: Retired Date Retired/Disabled/Unemployed: Sept 2020 Age Retired: 59 Special educational needs teacher Issues: Denies Guardian/Conservator: N/A   Abuse/Neglect Abuse/Neglect Assessment Can Be Completed: Yes Physical Abuse: Denies Verbal Abuse: Denies Sexual Abuse: Denies Exploitation of patient/patient's resources: Denies Self-Neglect: Denies  Patient response to: Social Isolation - How often do you feel lonely or isolated from those around you?: Never  Emotional Status Pt's affect, behavior and adjustment status: Pt in good spirits, just tired during session. Pt is adjusting as well as to be expected. Recent Psychosocial Issues: Denies Psychiatric History: Pt has hx of panic d/o per EMR. Substance Abuse History: Pt admits she quit smoking at time of admissions. Was smoking 1ppd since the age of 61 y.o.  Patient / Family Perceptions, Expectations & Goals Pt/Family understanding of illness & functional limitations: Pt and family have a general understanding of pt care neesds Premorbid pt/family roles/activities: Independent Anticipated changes in roles/activities/participation: Assistance with ADL/IADLs Pt/family expectations/goals: " I don't know. I just want to get home."  [employer]: None Premorbid Home Care/DME Agencies: None Transportation available at discharge: TBD Is the patient able to respond to transportation needs?: Yes In the past 12 months, has lack of transportation kept you from medical appointments or from getting medications?: No In the past 12 months, has lack of transportation kept you from meetings, work, or from getting things needed for daily living?: No Resource referrals recommended: Neuropsychology  Discharge Planning Living Arrangements: Children, Other relatives Support Systems: Children, Other relatives Type of Residence: Private residence Insurance Resources: Multimedia programmer (specify) ([facility] [employer]) Financial Resources: Radio broadcast assistant Screen Referred: No Living Expenses: Lives with  family Money Management: Patient Does the patient have any problems obtaining your medications?: No Home Management: Reports all help manage home care needs. She and dtr manage bills. Patient/Family Preliminary Plans: No changes Care Coordinator  Anticipated Follow Up Needs: HH/OP  Clinical Impression SW met with pt in room to introduce self, explain role, and discuss discharge process. Pt is not a English as a second language teacher. No HCPOA formal paperwork. Reports she will designate her dtr- [name]. No DME. Pt aware SW to follow-up with her daughter.   [place] spoke with pt dtr [name] to introduce self, explain role, and discuss discharge process. SW informed will continue to provide updates after team conference.   [name], MSW, [place] Office: [phone] Cell: [phone] Fax: [phone]   Rana Snare 11/24/2021, 2:13 PM
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
## 2021-11-24 NOTE — Progress Notes (Signed)
Inpatient Rehabilitation  Patient information reviewed and entered into eRehab system by Lamaj Metoyer M. Angell Pincock, M.A., CCC/SLP, PPS Coordinator.  Information including medical coding, functional ability and quality indicators will be reviewed and updated through discharge.    
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
## 2021-11-24 NOTE — Progress Notes (Signed)
Foley removed per order. Pt tolerated well, no complaints. Voiding trial to begin.

## 2021-11-24 NOTE — Progress Notes (Signed)
Occupational Therapy Session Note  Patient Details  Name: HAEDYN BREAU MRN: [id number] Date of Birth: 06/22/1961  Today's Date: 11/24/2021 OT Individual Time: 1400-1455 OT Individual Time Calculation (min): 55 min    Short Term Goals: Week 1:  OT Short Term Goal 1 (Week 1): Pt will don UB clothing with adaptive technique with min A OT Short Term Goal 2 (Week 1): Pt will complete toileting tasks with mod A OT Short Term Goal 3 (Week 1): Pt will complete toilet transfer with min A OT Short Term Goal 4 (Week 1): Pt's pain will be controlled to <6/10 during 2 consecutive OT sessions  Skilled Therapeutic Interventions/Progress Updates:  Skilled OT intervention completed with focus on toileting, LB self-care and pain management. Pt received upright in bed, c/o increased pain with therapist providing positional support, donned pt's sling as it was found off upon arrival, for increased comfort. Nurse in room for meds. Encouraged that pt sit EOB for repositioning with pt able to transition from semi-upright to EOB with CGA. Pt c/o dizziness upon sitting up but improved with fluids and rest. Therapist found that chuck pad was wet with pt's brief completely saturated however pt unaware stating "I thought I had that urine thing in." Educated pt on importance of getting up to void, as well as changing wet brief to maintain skin integrity. Pt declined standing once in position stating "help me Chantal Worthey, oh help me, I can't stand!" Therapist encouraged pt to try standing however declined despite max encouragement. Pt partially reclining herself back into bed, with CGA needed to get back into bed. Pt able to assist with long sitting rolling with use of bed rail for therapist to doff/donn brief at Max A, however pt able to complete front pericare when given wash cloths. Pt with productive coughing during session, with cues needed to use suction to spit out phlegm vs swallowing. Education provided to pt on using suction  and method of doing so with pt able to return demonstrate independence with use. Max A required for repositioning pt in bed, with pt left upright in bed, L elbow supported on pillows for comfort, bed alarm on and all needs in reach at end of session.   Therapy Documentation Precautions:  Precautions Precautions: Fall, Back, Cervical Required Braces or Orthoses: Sling, Cervical Brace Cervical Brace: Hard collar, At all times Restrictions Weight Bearing Restrictions: Yes LUE Weight Bearing: Non weight bearing Other Position/Activity Restrictions: immobilizer sling  Pain: 8/10 pain in L shoulder, nurse in room to provide meds, repositioned pt for comfort   Therapy/Group: Individual Therapy  Nayan Proch E Anselmo Reihl 11/24/2021, 12:57 PM
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
## 2021-11-25 MED ORDER — QUETIAPINE FUMARATE 25 MG PO TABS
25.0000 mg | ORAL_TABLET | Freq: Every day | ORAL | Status: DC
  Administered 2021-11-25 – 2021-12-07 (×13): 25 mg via ORAL
  Filled 2021-11-25 (×13): qty 1

## 2021-11-25 MED ORDER — IPRATROPIUM-ALBUTEROL 0.5-2.5 (3) MG/3ML IN SOLN
3.0000 mL | Freq: Four times a day (QID) | RESPIRATORY_TRACT | Status: DC | PRN
  Administered 2021-11-29 – 2021-12-08 (×13): 3 mL via RESPIRATORY_TRACT
  Filled 2021-11-25 (×4): qty 3
  Filled 2021-11-25: qty 9
  Filled 2021-11-25 (×4): qty 3
  Filled 2021-11-25: qty 9
  Filled 2021-11-25 (×4): qty 3

## 2021-11-25 NOTE — Care Management (Signed)
Inpatient Rehabilitation Center Individual Statement of Services  Patient Name:  [name]  Date:  11/25/2021  Welcome to the [facility].  Our goal is to provide you with an individualized program based on your diagnosis and situation, designed to meet your specific needs.  With this comprehensive rehabilitation program, you will be expected to participate in at least 3 hours of rehabilitation therapies Monday-Friday, with modified therapy programming on the weekends.  Your rehabilitation program will include the following services:  Physical Therapy (PT), Occupational Therapy (OT), 24 hour per day rehabilitation nursing, Therapeutic Recreaction (TR), Psychology, Neuropsychology, Care Coordinator, Rehabilitation Medicine, [place], and Other  Weekly team conferences will be held on Tuesdays to discuss your progress.  Your Inpatient Rehabilitation Care Coordinator will talk with you frequently to get your input and to update you on team discussions.  Team conferences with you and your family in attendance may also be held.  Expected length of stay: 10-14 days    Overall anticipated outcome: Supervision  Depending on your progress and recovery, your program may change. Your Inpatient Rehabilitation Care Coordinator will coordinate services and will keep you informed of any changes. Your Inpatient Rehabilitation Care Coordinator's name and contact numbers are listed  below.  The following services may also be recommended but are not provided by the [facility] will be made to provide these services after discharge if needed.  Arrangements include referral to agencies that provide these services.  Your insurance has been verified to be:  [employer]  Your primary doctor is:  Carolann Littler  Pertinent information will be shared with your doctor and your insurance company.  Inpatient Rehabilitation Care Coordinator:  Cathleen Corti [phone] or [phone]  Information discussed with and copy given to patient by: Rana Snare, 11/25/2021, 9:25 AM
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
## 2021-11-25 NOTE — Progress Notes (Signed)
Occupational Therapy Session Note  Patient Details  Name: [name] MRN: [id number] Date of Birth: 1961/07/10  Today's Date: 11/25/2021 OT Individual Time: 8592-7639 OT Individual Time Calculation (min): 45 min  and Today's Date: 11/25/2021 OT Missed Time: 30 Minutes Missed Time Reason: Pain  Short Term Goals: Week 1:  OT Short Term Goal 1 (Week 1): Pt will don UB clothing with adaptive technique with min A OT Short Term Goal 2 (Week 1): Pt will complete toileting tasks with mod A OT Short Term Goal 3 (Week 1): Pt will complete toilet transfer with min A OT Short Term Goal 4 (Week 1): Pt's pain will be controlled to <6/10 during 2 consecutive OT sessions  Skilled Therapeutic Interventions/Progress Updates:    Pt greeted semi-reclined in bed. Pt requested OT assist with repositioning pillow behind her back so she could take a sip of her drink. Pt also requesting a real coke out of the vending machine. Pt able to sit up with min A for pillow placement. Pt declined OOB at this time due to 9/10 p[ain and just receiving pain meds. Pt requested OT to return in 30 minutes. OT returned and pt agreeable to try to get up. OT also provided pt with her Coke which she was very appreciative of. Pt needed a straw to drink but was able to maintain grasp on cup with R hand. Pt needed much more than reasonable amount of time to get to the EOB with HOB elevated, but able to sit up with min A. OT encouraged pt to try to cross legs to thread pants, but pt stated she was in too much pain> Maax A to thread pants, pt then stood with min A and encouragement. Pt needed OT assist to pull pants up over hips. She could then pivot towards her R with Min A. Pt washed her face from recliner but declined to put on a shirt. Pt left seated in recliner with chair alarm on, call bell in reach, and needs met.   Therapy Documentation Precautions:  Precautions Precautions: Fall, Back, Cervical Required Braces or Orthoses: Sling,  Cervical Brace Cervical Brace: Hard collar, At all times Restrictions Weight Bearing Restrictions: Yes LUE Weight Bearing: Non weight bearing Other Position/Activity Restrictions: immobilizer sling General: General OT Amount of Missed Time: 30 Minutes Pain:  9/10 pain, neck, arm, rest and repositioned   Therapy/Group: Individual Therapy  Valma Cava 11/25/2021, 9:50 AM

## 2021-11-25 NOTE — Progress Notes (Signed)
Physical Therapy Session Note  Patient Details  Name: [name] MRN: [id number] Date of Birth: Dec 20, 1960  Today's Date: 11/25/2021 PT Individual Time: 5573-2202 and 1330-1445 PT Individual Time Calculation (min): 30 min and 75 min  Short Term Goals: Week 1:  PT Short Term Goal 1 (Week 1): Pt will perform bed mobility consistently with CGA. PT Short Term Goal 2 (Week 1): Pt will perform bed to chair consistnetly with CGA. PT Short Term Goal 3 (Week 1): Pt will perform sit to stand consistently with CGA. PT Short Term Goal 4 (Week 1): Pt will ambulate x100' with minA and LRAD. PT Short Term Goal 5 (Week 1): Pt will initiate stair training.  Skilled Therapeutic Interventions/Progress Updates:     1st Session: Pt eating soup upon PT arrival and requests to finish eating prior to therapy. Pt misses 30 minutes of skilled PT due to eating. PT returns and pt agrees to therapy. Reports pain in L arm. Number not provided. Pt has L upper extremity out of sling and requires modA to reposition in sling. Sit to stand with minA. Pt ambulates to toilet with minA and cues for upright gaze to improve posture and balance. Cues for hand plaacement and positioning for toilet transfer. Pt is continent of urine and independent with pericare. MinA for stand from toilet and PT pulls up pants while pt holds onto grab bar. Ambulatory transfer to [facility] with same assist and cues. WC transport to gym for time management. Pt ambulates x100' with minA, with cues for pursed lip breathing to optimize oxygen, and increasing stride length and gait speed to decrease risk for falls. Wc transport back to room. Stand step transfer back to recliner. Left with alarm intact and all needs within reach.  2nd Session: Pt received seated in recliner and agrees to therapy. Reports pain in L arm. Number not provided. PT provides rest breaks as needed to manage pain. Pt performs stand step transfer to [facility] with minA and cues for initiation,  sequencing, and positioning. WC transport outside for time management. Pt ambulates outdoor for gait training over unlevel and varying surfaces. Pt ambulates x125' with minA, utilizing R arm around PT's waist. PT provides cues to increase stride length and gait speed to decrease risk for falls, and maintaining upright gaze to improve posture and balance. Following extended seated rest break, pt ambulates additional 125' with same assist levels and cueing.   Pt completes x4 6" steps with R hand rail and minA, with cues for step sequencing and pursed lip breathing to optimize oxygen sats. Following extended seated rest break, pt ambulates x210' back to room, takes rest break, then ambulates to toilet with minA and cues for hand placement for safe toilet transfer. Pt performs pericare independently. Left seated in recliner with alarm intact and all needs within reach.  Therapy Documentation Precautions:  Precautions Precautions: Fall, Back, Cervical Required Braces or Orthoses: Sling, Cervical Brace Cervical Brace: Hard collar, At all times Restrictions Weight Bearing Restrictions: Yes LUE Weight Bearing: Non weight bearing Other Position/Activity Restrictions: immobilizer sling    Therapy/Group: Individual Therapy  Breck Coons, PT, DPT 11/25/2021, 3:52 PM

## 2021-11-25 NOTE — Progress Notes (Signed)
Patient ID: [name], female   DOB: 07-25-1961, 61 y.o.   MRN: [id number]  SW left message for pt dtr [name] [phone]) to inform on ELOS 10-14 days and will f/u on Tuesday after team conference. SW encouraged follow-up if needed.   Loralee Pacas, MSW, [place] Office: [phone] Cell: [phone] Fax: [phone]

## 2021-11-26 NOTE — Progress Notes (Addendum)
PROGRESS NOTE   Subjective/Complaints: Had a much better night in respect to her sleep/anxiety.  Still having a lot of generalized pain.   ROS: Patient denies fever, rash, sore throat, blurred vision, dizziness, nausea, vomiting, diarrhea, cough, shortness of breath or chest pain     Objective:   No results found. Recent Labs    11/24/21 0556  WBC 11.5*  HGB 10.0*  HCT 30.9*  PLT 379   Recent Labs    11/24/21 0556  NA 137  K 3.7  CL 104  CO2 26  GLUCOSE 120*  BUN 21  CREATININE 0.58  CALCIUM 8.7*    Intake/Output Summary (Last 24 hours) at 11/26/2021 1041 Last data filed at 11/26/2021 0700 Gross per 24 hour  Intake 720 ml  Output --  Net 720 ml        Physical Exam: Vital Signs Blood pressure 104/63, pulse 95, temperature 97.8 F (36.6 C), resp. rate 20, height 5\' 7"  (1.702 m), weight 58.9 kg, SpO2 90 %.  Constitutional: No distress . Vital signs reviewed. HEENT: NCAT, EOMI, oral membranes moist Neck: supple Cardiovascular: RRR without murmur. No JVD    Respiratory/Chest: CTA Bilaterally without wheezes or rales. Normal effort    GI/Abdomen: BS +, non-tender, non-distended Ext: no clubbing, cyanosis, or edema Psych: pleasant and cooperative  Skin: large right brow lac cdi, cervical incision CDI with steristrips. Stable in appearance.  Multiple bruises, including left shoulder Neuro:  alert Oriented to person, place, reason, month/year. Moves all 4's with pain inhibition. No sensory findings. DTR's 1+, no focal CN findings--stable exam Musculoskeletal: pain with passive movement of left shoulder as well as trunk/back. [place] J collar fitting appropriately    Assessment/Plan: 1. Functional deficits which require 3+ hours per day of interdisciplinary therapy in a comprehensive inpatient rehab setting. Physiatrist is providing close team supervision and 24 hour management of active medical problems listed  below. Physiatrist and rehab team continue to assess barriers to discharge/monitor patient progress toward functional and medical goals  Care Tool:  Bathing  Bathing activity did not occur: Safety/medical concerns Body parts bathed by patient: Abdomen, Chest, Front perineal area, Right upper leg, Left upper leg, Face   Body parts bathed by helper: Buttocks, Right lower leg, Left lower leg, Right arm, Left arm     Bathing assist Assist Level: Maximal Assistance - Patient 24 - 49%     Upper Body Dressing/Undressing Upper body dressing   What is the patient wearing?: Hospital gown only    Upper body assist Assist Level: Total Assistance - Patient < 25%    Lower Body Dressing/Undressing Lower body dressing      What is the patient wearing?: Incontinence brief     Lower body assist Assist for lower body dressing: Maximal Assistance - Patient 25 - 49%     Toileting Toileting Toileting Activity did not occur (Clothing management and hygiene only): N/A (no void or bm)  Toileting assist Assist for toileting: Maximal Assistance - Patient 25 - 49%     Transfers Chair/bed transfer  Transfers assist  Chair/bed transfer activity did not occur: Safety/medical concerns (limited by pain)  Chair/bed transfer assist level: Minimal Assistance -  Patient > 75%     Locomotion Ambulation   Ambulation assist   Ambulation activity did not occur: Safety/medical concerns  Assist level: 2 helpers   Max distance: 20'   Walk 10 feet activity   Assist  Walk 10 feet activity did not occur: Safety/medical concerns  Assist level: Minimal Assistance - Patient > 75%     Walk 50 feet activity   Assist Walk 50 feet with 2 turns activity did not occur: Safety/medical concerns         Walk 150 feet activity   Assist Walk 150 feet activity did not occur: Safety/medical concerns         Walk 10 feet on uneven surface  activity   Assist Walk 10 feet on uneven surfaces  activity did not occur: Safety/medical concerns   Assist level: Minimal Assistance - Patient > 75% Assistive device:  (R hand rail)   Wheelchair     Assist Is the patient using a wheelchair?: No   Wheelchair activity did not occur: Safety/medical concerns         Wheelchair 50 feet with 2 turns activity    Assist    Wheelchair 50 feet with 2 turns activity did not occur: Safety/medical concerns       Wheelchair 150 feet activity     Assist  Wheelchair 150 feet activity did not occur: Safety/medical concerns       Blood pressure 104/63, pulse 95, temperature 97.8 F (36.6 C), resp. rate 20, height 5\' 7"  (1.702 m), weight 58.9 kg, SpO2 90 %.  Medical Problem List and Plan: 1. Functional deficits secondary to polytrauma including cervical fx/subluxation s/p ACDF on 11/19/21.              -patient may shower. Will need replacement pads for collar.              -ELOS/Goals: 14-16 days, supervision goals  -Continue CIR therapies including PT, OT    2.  Antithrombotics: -DVT/anticoagulation:  Pharmaceutical: Lovenox BID             -antiplatelet therapy:  N/A 3. Pain Management: decrease tylenol to 650 mg qid              ---off decadron since 2/13.             --continue oxycodone prn.   4. Mood: LCSW to follow for evaluation and support.              -antipsychotic agents: N/A 5. Neuropsych: This patient may be intermittently capable of making decisions on her  own behalf.  -general fatigue, but lethargy better    6. Skin/Wound Care: cervical wound looks great  -can remove right frontal sutures 2/20 or 2/21 7. Fluids/Electrolytes/Nutrition: Monitor I/O.   -encouraging PO -protein supp for low albumin -LFT's normal  8. Cervical fractures C4-C7 s/p ACDF: Collar to stay on for support.  9. Left humeral head/neck fractures: May need Sarmiento brace for support as has not been compliant with sling             --f/u shoulder X rays stable in appearance -ortho  to f/u  10. Pre-renal azotemia: Encourage fluid intake.  -Offer supplements between meals.  -BUN/Cr improved 2/15--f/u 2/20 11. Abnormal LFTs: now WNL.  12. Multiple rib fractures/Pulmonary contusion: Fluid overload: Leukocytosis: WBC  13. Acute on chronic anxiety issues:  To avoid benzo's (was d/c) per PCP note.   --currently only on 0.25mg  xanax tid prn -added low dose  seroquel to help with sleep 2/16--seems to have helped             -continue lexapro 5mg  qhs started on admit  -neuropsych to see 14. Urinary retention:               --voiding trial, seems to be emptying, PVR 0 15. Fluid overload: Lasix transitioned to diamox BID for past three days for elevated CO2 levels?              --cxr stable  -coarse breath sounds from COPD =IS, OOb  16. Acute blood loss anemia: stable 10 2/15.    -f/u Monday 2/20    LOS: 3 days A FACE TO FACE EVALUATION WAS PERFORMED  Meredith Staggers 11/26/2021, 10:41 AM

## 2021-11-26 NOTE — Progress Notes (Signed)
Physical Therapy Session Note  Patient Details  Name: [name] MRN: [id number] Date of Birth: 1961-06-11  Today's Date: 11/26/2021 PT Individual Time: 8563-1497 PT Individual Time Calculation (min): 29 min   Short Term Goals: Week 1:  PT Short Term Goal 1 (Week 1): Pt will perform bed mobility consistently with CGA. PT Short Term Goal 2 (Week 1): Pt will perform bed to chair consistnetly with CGA. PT Short Term Goal 3 (Week 1): Pt will perform sit to stand consistently with CGA. PT Short Term Goal 4 (Week 1): Pt will ambulate x100' with minA and LRAD. PT Short Term Goal 5 (Week 1): Pt will initiate stair training.  Skilled Therapeutic Interventions/Progress Updates:  Pt missed 31 min of skilled therapy due to requesting to complete lunch as well as feeling fatigued. Will re-attempt as schedule and pt availability permits.  Patient seated upright in recliner on entrance to room and asleep. Patient requires time to wake and requests time to continue to eat lunch prior to PT session.   Patient with mild pain complaint during session.  She is fatigued and relates being tired from morning therapies feeling as if she has done enough today. Provided emotional support and active listening as well as some encouragement and focus to continue to eat for energy and to progress healing. Education provided for need to mobilize in order to assist with progression of healing. Pt states she realizes this but has done "enough today". Able to convince pt to rise to stand in order to reposition pillows and blankets in her recliner. She does require MinA for rise to stand with CGA for balance. Assisted with descent to sit. Set up with tray table and opened packages for peaches, pudding and tea as pt does not have strength to open packaging.   Patient seated upright  in recliner at end of session with brakes locked, seat pad alarm set, and all needs within reach.     Therapy Documentation Precautions:   Precautions Precautions: Fall, Back, Cervical Required Braces or Orthoses: Sling, Cervical Brace Cervical Brace: Hard collar, At all times Restrictions Weight Bearing Restrictions: Yes LUE Weight Bearing: Non weight bearing Other Position/Activity Restrictions: immobilizer sling General:   Pain:  Pt reports mild soreness/ pain addressed with repositioning.   Therapy/Group: Individual Therapy  Alger Simons PT, DPT 11/26/2021, 1:57 PM

## 2021-11-26 NOTE — Progress Notes (Signed)
Occupational Therapy TBI Note  Patient Details  Name: [name] MRN: [id number] Date of Birth: 26-Mar-1961  Today's Date: 11/26/2021 OT Individual Time: 1100-1150 OT Individual Time Calculation (min): 50 min  and Today's Date: 11/26/2021 OT Missed Time: 10 Minutes Missed Time Reason: Patient fatigue   Short Term Goals: Week 1:  OT Short Term Goal 1 (Week 1): Pt will don UB clothing with adaptive technique with min A OT Short Term Goal 2 (Week 1): Pt will complete toileting tasks with mod A OT Short Term Goal 3 (Week 1): Pt will complete toilet transfer with min A OT Short Term Goal 4 (Week 1): Pt's pain will be controlled to <6/10 during 2 consecutive OT sessions  Skilled Therapeutic Interventions/Progress Updates:    Pt received sitting in the recliner with no c/o pain at rest. Pt stating she feels like she is in "lala land". Upon beginning to move pt realized pt's [place] was not hooked up to O2 line- on room air. SPO2 assessed and at 79-80%. Alerted LPN. Upon administering 2 L O2 her SpO2 increased to 95% and pt became more alert and reported she felt better. She was reluctant to participation at first but was able to be encouraged to at least get out of recliner. She completed a sit > stand with close (S). Min HHA to the w/c. She sat in the w/c and completed grooming tasks while OT assisted with hair care. Pt had matted clumps of blood in her hair that were cared for as  best as possible without showering. Encouraged her to shower soon to rinse hair. Pt returned to the recliner with min HHA. She was left sitting up with all needs met, chair alarm set.   Therapy Documentation Precautions:  Precautions Precautions: Fall, Back, Cervical Required Braces or Orthoses: Sling, Cervical Brace Cervical Brace: Hard collar, At all times Restrictions Weight Bearing Restrictions: Yes LUE Weight Bearing: Non weight bearing Other Position/Activity Restrictions: immobilizer sling   Therapy/Group:  Individual Therapy  DESHANTA LADY 11/26/2021, 12:56 PM

## 2021-11-26 NOTE — IPOC Note (Signed)
Overall Plan of Care [facility]) Patient Details Name: ALLISSA ALBRIGHT MRN: [id number] DOB: Feb 28, 1961  Admitting Diagnosis: Trauma  Hospital Problems: Principal Problem:   Trauma     Functional Problem List: Nursing Bladder, Endurance, Medication Management, Pain, Perception, Skin Integrity  PT Balance, Endurance, Motor, Pain, Safety  OT Balance, Endurance, Pain, Motor, Safety  SLP    TR         Basic ADLs: OT Bathing, Dressing, Toileting     Advanced  ADLs: OT       Transfers: PT Bed Mobility, Bed to Chair, Car, Manufacturing systems engineer, Metallurgist: PT Ambulation, Stairs     Additional Impairments: OT None  SLP        TR      Anticipated Outcomes Item Anticipated Outcome  Self Feeding no goal set  Swallowing      Basic self-care  (S)  Toileting  (S)   Bathroom Transfers (S)  Bowel/Bladder  Supervision  Transfers  Supervision  Locomotion  Supervision  Communication     Cognition     Pain  < 3  Safety/Judgment  supervision   Therapy Plan: PT Intensity: Minimum of 1-2 x/day ,45 to 90 minutes PT Frequency: 5 out of 7 days PT Duration Estimated Length of Stay: 10-14 days OT Intensity: Minimum of 1-2 x/day, 45 to 90 minutes OT Frequency: 5 out of 7 days OT Duration/Estimated Length of Stay: 10-14 days     Due to the current state of emergency, patients may not be receiving their 3-hours of Medicare-mandated therapy.   Team Interventions: Nursing Interventions Patient/Family Education, Bladder Management, Disease Management/Prevention, Pain Management, Medication Management, Skin Care/Wound Management, Discharge Planning  PT interventions Ambulation/gait training, Community reintegration, DME/adaptive equipment instruction, Neuromuscular re-education, Psychosocial support, Stair training, UE/LE Strength taining/ROM, Wheelchair propulsion/positioning, Training and development officer, Discharge planning, Functional electrical stimulation,  Pain management, Skin care/wound management, Therapeutic Activities, UE/LE Coordination activities, Cognitive remediation/compensation, Disease management/prevention, Functional mobility training, Patient/family education, Splinting/orthotics, Therapeutic Exercise, Visual/perceptual remediation/compensation  OT Interventions Balance/vestibular training, Disease mangement/prevention, Self Care/advanced ADL retraining, Therapeutic Exercise, UE/LE Strength taining/ROM, Skin care/wound managment, Pain management, DME/adaptive equipment instruction, Community reintegration, Patient/family education, UE/LE Coordination activities, Discharge planning, Functional mobility training, Psychosocial support, Therapeutic Activities  SLP Interventions    TR Interventions    SW/CM Interventions Discharge Planning, Psychosocial Support, Patient/Family Education   Barriers to Discharge MD  Medical stability  Nursing Decreased caregiver support, Home environment access/layout, Wound Care, Lack of/limited family support, Weight bearing restrictions, Medication compliance 1 level, 5 steps, 2 rails. Candice Moore to provide min assist.  PT Weight bearing restrictions    OT      SLP      SW       Team Discharge Planning: Destination: PT-Home ,OT- Home , SLP-  Projected Follow-up: PT-Home health PT, 24 hour supervision/assistance, OT-  Home health OT, SLP-  Projected Equipment Needs: PT- , OT- To be determined, SLP-  Equipment Details: PT- , OT-  Patient/family involved in discharge planning: PT- Patient,  OT-Patient, SLP-   MD ELOS: 10-14 days Medical Rehab Prognosis:  Excellent Assessment: The patient has been admitted for CIR therapies with the diagnosis of polytrauma including cervical spine fractures requiring ACDF. Pt also with significant anxiety disorder. The team will be addressing functional mobility, strength, stamina, balance, safety, adaptive techniques and equipment, self-care, bowel and bladder  mgt, patient and caregiver education, NMR, pain control, post-op precautions behavior. Goals have been set at supervision for mobility and  self-care. Pt motivated.   Due to the current state of emergency, patients may not be receiving their 3 hours per day of Medicare-mandated therapy.    Meredith Staggers, MD, FAAPMR     See Team Conference Notes for weekly updates to the plan of care

## 2021-11-26 NOTE — Progress Notes (Addendum)
Occupational Therapy Session Note  Patient Details  Name: [name] MRN: [id number] Date of Birth: 06/27/1961  Today's Date: 11/26/2021 OT Individual Time: 1657-9038 OT Individual Time Calculation (min): 56 min   Short Term Goals: Week 1:  OT Short Term Goal 1 (Week 1): Pt will don UB clothing with adaptive technique with min A OT Short Term Goal 2 (Week 1): Pt will complete toileting tasks with mod A OT Short Term Goal 3 (Week 1): Pt will complete toilet transfer with min A OT Short Term Goal 4 (Week 1): Pt's pain will be controlled to <6/10 during 2 consecutive OT sessions  Skilled Therapeutic Interventions/Progress Updates:    Pt greeted seated in recliner reporting 9/10 pain. Nursing notified and entered to give meds which she needed cut in 1/2 to swallow. Pt then needed set-up A for feeding as she was unable to open containers. Pt able to manipulate feeding utensil with R hand without difficulty, although needed assist positioning in recliner due to pain. Pt with frequent coughing spells during session requiring suction to get out secretions. Pt needed multiple rest breaks with all BADL tasks due to pain. OT tried to teach her strategies for LB dressing, but she was in too much pain to try herself, so she needed max A from OT. Sit<>stands from recliner with Min A, then min A for balance while washing peri-area and buttocks. Noted incontinence of urine recliner. Pad changed and OT assisted with donning new brief. MD entered room and took off honeycomb dressing from neck and stated pt could shower with neck supported on wall in shower. Pt then sat in recliner and OT educated on techniques for donning shirt with limited ROM of L UE. Pt in too much pain to assist with donning new gown requiring max A. Pt maintained on 3L of O2 throughout session. Pt left seated in recliner at end of session with chair alarm on, call bell in reach, and needs met.   Therapy Documentation Precautions:   Precautions Precautions: Fall, Back, Cervical Required Braces or Orthoses: Sling, Cervical Brace Cervical Brace: Hard collar, At all times Restrictions Weight Bearing Restrictions: Yes LUE Weight Bearing: Non weight bearing Other Position/Activity Restrictions: immobilizer sling Pain: Pain Assessment Pain Score: 3 9/10 L arm and neck. Rest and repositioned   Therapy/Group: Individual Therapy  Valma Cava 11/26/2021, 10:51 AM

## 2021-11-27 MED ORDER — SORBITOL 70 % SOLN
30.0000 mL | Freq: Every day | Status: DC | PRN
  Administered 2021-11-27 – 2021-11-30 (×3): 30 mL via ORAL
  Filled 2021-11-27 (×3): qty 30

## 2021-11-27 NOTE — Progress Notes (Signed)
Occupational Therapy Session Note  Patient Details  Name: [name] MRN: [id number] Date of Birth: [birth year]  Today's Date: 11/27/2021 OT Individual Time: 0732-0830 OT Individual Time Calculation (min): 58 min   Short Term Goals: Week 1:  OT Short Term Goal 1 (Week 1): Pt will don UB clothing with adaptive technique with min A OT Short Term Goal 2 (Week 1): Pt will complete toileting tasks with mod A OT Short Term Goal 3 (Week 1): Pt will complete toilet transfer with min A OT Short Term Goal 4 (Week 1): Pt's pain will be controlled to <6/10 during 2 consecutive OT sessions  Skilled Therapeutic Interventions/Progress Updates:    Pt greeted seated in recliner and agreeable to OT treatment session. Pt agreeable to shower today after OT obtained replacement miami J pads. Pt requested anxiety medications but nursing was unable to bring them during our session. Pt maintained on 3L of O2 throughout session with SpO2 above 97%. Pt reported feeling like she couldn't breathe a few times while in shower requiring cues for breathing techniques to decrease anxiety. Functional ambulation into bathroom with min HHA. Mod A for bathing as pt was cold and anxious and did not want to try figure 4 to wash feet or buttocks. Dressing tasks from wc with max A from OT due to to pain and fatigue. [place] J pads changed in supported reclined position of recliner. Old pads washed and placed in window seal. Mod A to don new gown due to pain in L UE and neck. Pt left semi-reclined in recliner with nursing present to administer meds.  Therapy Documentation Precautions:  Precautions Precautions: Fall, Back, Cervical Required Braces or Orthoses: Sling, Cervical Brace Cervical Brace: Hard collar, At all times Restrictions Weight Bearing Restrictions: Yes LUE Weight Bearing: Non weight bearing Other Position/Activity Restrictions: immobilizer sling Pain: Pain Assessment Pain Scale: 0-10 Pain Score: 7  Pain Type:  Acute pain Pain Location: Shoulder Pain Orientation: Left Pain Descriptors / Indicators: Aching Pain Onset: On-going Pain Intervention(s): Repositioned   Therapy/Group: Individual Therapy  Valma Cava 11/27/2021, 9:32 AM

## 2021-11-27 NOTE — Progress Notes (Signed)
PROGRESS NOTE   Subjective/Complaints: Patient states she took a shower this morning and had no issues.  ROS: Patient denies fever, rash, sore throat, blurred vision, dizziness, nausea, vomiting, diarrhea, cough, shortness of breath or chest pain     Objective:   No results found. No results for input(s): WBC, HGB, HCT, PLT in the last 72 hours.  No results for input(s): NA, K, CL, CO2, GLUCOSE, BUN, CREATININE, CALCIUM in the last 72 hours.   Intake/Output Summary (Last 24 hours) at 11/27/2021 1131 Last data filed at 11/27/2021 0851 Gross per 24 hour  Intake 960 ml  Output --  Net 960 ml         Physical Exam: Vital Signs Blood pressure 117/66, pulse 81, temperature 98 F (36.7 C), resp. rate 16, height 5\' 7"  (1.702 m), weight 58.9 kg, SpO2 99 %.  General: No acute distress Mood and affect are appropriate Heart: Regular rate and rhythm no rubs murmurs or extra sounds Lungs: Clear to auscultation, breathing unlabored, no rales or wheezes Abdomen: Positive bowel sounds, soft nontender to palpation, nondistended Extremities: No clubbing, cyanosis, or edema Skin: No evidence of breakdown, no evidence of rash   Skin: large right brow lac cdi, cervical incision CDI with steristrips. Stable in appearance.  Multiple bruises, including left shoulder Neuro:  alert Oriented to person, place, reason, month/year. Moves all 4's with pain inhibition. No sensory findings. DTR's 1+, no focal CN findings--stable exam Musculoskeletal: pain with passive movement of left shoulder as well as trunk/back. [place] J collar fitting appropriately    Assessment/Plan: 1. Functional deficits which require 3+ hours per day of interdisciplinary therapy in a comprehensive inpatient rehab setting. Physiatrist is providing close team supervision and 24 hour management of active medical problems listed below. Physiatrist and rehab team continue to  assess barriers to discharge/monitor patient progress toward functional and medical goals  Care Tool:  Bathing    Body parts bathed by patient: Abdomen, Chest, Front perineal area, Right upper leg, Left upper leg, Face   Body parts bathed by helper: Buttocks, Right lower leg, Left lower leg, Right arm, Left arm     Bathing assist Assist Level: Maximal Assistance - Patient 24 - 49%     Upper Body Dressing/Undressing Upper body dressing   What is the patient wearing?: Hospital gown only    Upper body assist Assist Level: Total Assistance - Patient < 25%    Lower Body Dressing/Undressing Lower body dressing      What is the patient wearing?: Incontinence brief     Lower body assist Assist for lower body dressing: Maximal Assistance - Patient 25 - 49%     Toileting Toileting Toileting Activity did not occur (Clothing management and hygiene only): N/A (no void or bm)  Toileting assist Assist for toileting: Maximal Assistance - Patient 25 - 49%     Transfers Chair/bed transfer  Transfers assist  Chair/bed transfer activity did not occur: Safety/medical concerns (limited by pain)  Chair/bed transfer assist level: Minimal Assistance - Patient > 75%     Locomotion Ambulation   Ambulation assist      Assist level: 2 helpers   Max distance: 43'  Walk 10 feet activity   Assist     Assist level: Minimal Assistance - Patient > 75%     Walk 50 feet activity   Assist Walk 50 feet with 2 turns activity did not occur: Safety/medical concerns         Walk 150 feet activity   Assist Walk 150 feet activity did not occur: Safety/medical concerns         Walk 10 feet on uneven surface  activity   Assist     Assist level: Minimal Assistance - Patient > 75% Assistive device:  (R hand rail)   Wheelchair     Assist Is the patient using a wheelchair?: No             Wheelchair 50 feet with 2 turns activity    Assist             Wheelchair 150 feet activity     Assist          Blood pressure 117/66, pulse 81, temperature 98 F (36.7 C), resp. rate 16, height 5\' 7"  (1.702 m), weight 58.9 kg, SpO2 99 %.  Medical Problem List and Plan: 1. Functional deficits secondary to polytrauma including cervical fx/subluxation s/p ACDF on 11/19/21.              -patient may shower. Will need replacement pads for collar.              -ELOS/Goals: 14-16 days, supervision goals  -Continue CIR therapies including PT, OT    2.  Antithrombotics: -DVT/anticoagulation:  Pharmaceutical: Lovenox BID             -antiplatelet therapy:  N/A 3. Pain Management: decrease tylenol to 650 mg qid              ---off decadron since 2/13.             --continue oxycodone prn.   4. Mood: LCSW to follow for evaluation and support.              -antipsychotic agents: N/A 5. Neuropsych: This patient may be intermittently capable of making decisions on her  own behalf.  -general fatigue, but lethargy better    6. Skin/Wound Care: cervical wound looks great  -can remove right frontal sutures 2/20 or 2/21 7. Fluids/Electrolytes/Nutrition: Monitor I/O.   -encouraging PO -protein supp for low albumin -LFT's normal  8. Cervical fractures C4-C7 s/p ACDF: Collar to stay on for support.  9. Left humeral head/neck fractures: May need Sarmiento brace for support as has not been compliant with sling             --f/u shoulder X rays stable in appearance, still has pain with even minimal movement -ortho to f/u  10. Pre-renal azotemia: Encourage fluid intake.  -Offer supplements between meals.  -BUN/Cr improved 2/15--f/u 2/20 11. Abnormal LFTs: now WNL.  12. Multiple rib fractures/Pulmonary contusion: Fluid overload: Leukocytosis: WBC  13. Acute on chronic anxiety issues:  To avoid benzo's (was d/c) per PCP note.   --currently only on 0.25mg  xanax tid prn -added low dose seroquel to help with sleep 2/16--seems to have helped              -continue lexapro 5mg  qhs started on admit  -neuropsych to see 14. Urinary retention:              Resolved 15. Fluid overload: Resolved off Diamox  16. Acute blood loss anemia: stable 10 2/15.    -  f/u Monday 2/20    LOS: 4 days A FACE TO FACE EVALUATION WAS PERFORMED  Charlett Blake 11/27/2021, 11:31 AM

## 2021-11-27 NOTE — Progress Notes (Signed)
Occupational Therapy Session Note  Patient Details  Name: [name] MRN: [id number] Date of Birth: 05/20/1961  Today's Date: 11/27/2021 OT Individual Time: 1300-1355 OT Individual Time Calculation (min): 55 min    Short Term Goals: Week 1:  OT Short Term Goal 1 (Week 1): Pt will don UB clothing with adaptive technique with min A OT Short Term Goal 2 (Week 1): Pt will complete toileting tasks with mod A OT Short Term Goal 3 (Week 1): Pt will complete toilet transfer with min A OT Short Term Goal 4 (Week 1): Pt's pain will be controlled to <6/10 during 2 consecutive OT sessions  Skilled Therapeutic Interventions/Progress Updates:     Pt received in bed with unrated back pain which RN provides meds  and OT provides heat pack (disposable) which assists. Pt would benefit from a K pad  ADL: Pt completes functional mobility throughout the room with MIN HHA and Vc for scooting towards EOC as well as VC for weigh shift forward for improved body mechanics. Pt sits at sink and combs part of head, however requires MAX A to get tangles out. OT then styles hair to prevent matting when sitting in recliner or bed and improve ability to check skin/scalp for pressure spots. Pt returns to recliner and OT changes dry miami-j pads back with pt reclined and neck supported/still.  Pt left at end of session in recliner with exit alarm on, call light in reach and all needs met   Therapy Documentation Precautions:  Precautions Precautions: Fall, Back, Cervical Required Braces or Orthoses: Sling, Cervical Brace Cervical Brace: Hard collar, At all times Restrictions Weight Bearing Restrictions: Yes LUE Weight Bearing: Non weight bearing Other Position/Activity Restrictions: immobilizer sling General:   Vital Signs: Therapy Vitals Temp: 98 F (36.7 C) Pulse Rate: 81 Resp: 16 BP: 117/66 Patient Position (if appropriate): Sitting Oxygen Therapy SpO2: 99 % O2 Device: Nasal  Cannula   Therapy/Group: Individual Therapy  [name] 11/27/2021, 6:59 AM

## 2021-11-27 NOTE — Progress Notes (Signed)
Physical Therapy Session Note  Patient Details  Name: [name] MRN: [id number] Date of Birth: [birth year]  Today's Date: 11/27/2021 PT Individual Time: 0915-1000 PT Individual Time Calculation (min): 45 min   Short Term Goals: Week 1:  PT Short Term Goal 1 (Week 1): Pt will perform bed mobility consistently with CGA. PT Short Term Goal 2 (Week 1): Pt will perform bed to chair consistnetly with CGA. PT Short Term Goal 3 (Week 1): Pt will perform sit to stand consistently with CGA. PT Short Term Goal 4 (Week 1): Pt will ambulate x100' with minA and LRAD. PT Short Term Goal 5 (Week 1): Pt will initiate stair training.  Skilled Therapeutic Interventions/Progress Updates:     Pt misses 15 minutes of skilled PT due to fatigue and requesting to rest. Upon follow up, pt agreeable to therapy and reports pain in L arm. Number not provided. PT provides rest breaks as needed to manage pain. Pt verbalizes feeling dizzy and light headed and like she "can't breath". O2 assessed and pt at 94% at 1.5L/min. BP assessed at 129/67. PT encourages pt to attempt to mobilize even though she does not feel well and pt eventually agreeable. Sit to stand with minA and cues for body mechanics and sequencing. Pt ambulates x300' with CGA/minA, with cues for upright gaze to improve posture and balance, and increasing gait speed to decrease risk for falls. Pt performs Nustep for 8:00 at workload of 3 with average steps per minute ~25 (PT cues pt to attempt to maintain >40), using primarily bilateral lower extremities. Pt ambulates back to room with minA and slight posterior bias, requiring manual and verbal cues for safety. Left seated in recliner with alarm intact and all needs within reach.  Therapy Documentation Precautions:  Precautions Precautions: Fall, Back, Cervical Required Braces or Orthoses: Sling, Cervical Brace Cervical Brace: Hard collar, At all times Restrictions Weight Bearing Restrictions: Yes LUE  Weight Bearing: Non weight bearing Other Position/Activity Restrictions: immobilizer sling    Therapy/Group: Individual Therapy  Breck Coons, PT, DPT 11/27/2021, 12:50 PM

## 2021-11-28 NOTE — Progress Notes (Signed)
Attempted to wean patient off oxygen at rest following therapies. Upon rechecking, patients oxygen at 88% on RA. Patient denies SOB at this time. 1.5 L o2 [address] resumed with oxygen saturation now at 94%. Will continue to monitor.

## 2021-11-28 NOTE — Progress Notes (Signed)
Physical Therapy Session Note  Patient Details  Name: [name] MRN: [id number] Date of Birth: [birth year]  Today's Date: 11/28/2021 PT Individual Time: 2725-3664 PT Individual Time Calculation (min): 45 min   Short Term Goals: Week 1:  PT Short Term Goal 1 (Week 1): Pt will perform bed mobility consistently with CGA. PT Short Term Goal 2 (Week 1): Pt will perform bed to chair consistnetly with CGA. PT Short Term Goal 3 (Week 1): Pt will perform sit to stand consistently with CGA. PT Short Term Goal 4 (Week 1): Pt will ambulate x100' with minA and LRAD. PT Short Term Goal 5 (Week 1): Pt will initiate stair training.  Skilled Therapeutic Interventions/Progress Updates:     Patient in recliner on 1.5 L/min O2 with c-coller in place in the room finishing lunch upon PT arrival. Patient alert and agreeable to PT session. Patient reported increased fatigue due to sleeping poorly and 8.5/10 R shoulder and cervical pain during session, RN made aware. PT provided repositioning, rest breaks, and distraction as pain interventions throughout session.   Discussed poor sleep, patient unable to identify source of arousal at night. Educated on sleep hygiene strategies to improve night time sleep. Patient reports tenderness of the bruise on her L shoulder, notable discoloration and edema.   Applied Kinesiotape over L shoulder using lymphatic drainage technique with the aim of reducing bruising and edema. Prior to application, patient denied any history of skin irritation or allergy to adhesive. Educated patient on purpose of kinesiotape placement and signs symptoms of allergic reaction or irritation. Cleaned patient's skin and applied test strip at beginning of session and removed at end of session without sings of skin irritation. Instructed patient that the tape can be left on up to 3 days and can be worn in the shower. Instructed to removed the tape if peeling off, signs of skin irritation arise, or it  has been on for >3 days. Informed patient that the tape is best removed in the shower or with a wet wash cloth. Patient stated understanding. Rehab team informed about tape placement to assist with monitoring patient's response.  During kinesiotape application, discussed patient's smoking history, habits, and intentions for cessation following hospital stay. Patient is aware of the impact smoking has on her recovery from her injuries, risk for stroke or heart attack, and worsening her symptoms and lung function with COPD. Patient reports she would like to quit smoking when she gets home. Discussed alternative activities to due at the times she normally would smoke and having all cigarettes and paraphernalia removed from the home prior to d/c. Patient reports her granddaughter will help with this.   Patient asked about going home on oxygen. Patient SPO2 100% on 1.5 L/min at rest. Pt performed room air trial at rest x5 min with SPO2 92-96%. Patient performed sit to stand with close supervision and stood >2 min with supervision on RA, SPO2 dropped to 88% with increased SOB and work of breathing and patient requested to sit. In sitting patient recovered to 94% on RA. Discussed room air trial at rest for patient with RN, RN in agreement. Informed RN that patient will need O2 for mobility at this time.  Patient in recliner on RA with c-collar in place and tightened by PT at end of session with breaks locked, chair alarm set, and all needs within reach.   Therapy Documentation Precautions:  Precautions Precautions: Fall, Back, Cervical Required Braces or Orthoses: Sling, Cervical Brace Cervical Brace: Hard collar, At  all times Restrictions Weight Bearing Restrictions: Yes LUE Weight Bearing: Non weight bearing Other Position/Activity Restrictions: immobilizer sling General: PT Amount of Missed Time (min): 15 Minutes PT Missed Treatment Reason: Patient fatigue;Pain    Therapy/Group: Individual  Therapy  Reathel Turi L Tammie Yanda PT, DPT  11/28/2021, 4:58 PM

## 2021-11-29 LAB — CBC
HCT: 26.3 % — ABNORMAL LOW (ref 36.0–46.0)
Hemoglobin: 8.3 g/dL — ABNORMAL LOW (ref 12.0–15.0)
MCH: 31.4 pg (ref 26.0–34.0)
MCHC: 31.6 g/dL (ref 30.0–36.0)
MCV: 99.6 fL (ref 80.0–100.0)
Platelets: 419 10*3/uL — ABNORMAL HIGH (ref 150–400)
RBC: 2.64 MIL/uL — ABNORMAL LOW (ref 3.87–5.11)
RDW: 13.2 % (ref 11.5–15.5)
WBC: 10.3 10*3/uL (ref 4.0–10.5)
nRBC: 0 % (ref 0.0–0.2)

## 2021-11-29 LAB — BASIC METABOLIC PANEL
Anion gap: 8 (ref 5–15)
BUN: 17 mg/dL (ref 8–23)
CO2: 32 mmol/L (ref 22–32)
Calcium: 8.6 mg/dL — ABNORMAL LOW (ref 8.9–10.3)
Chloride: 97 mmol/L — ABNORMAL LOW (ref 98–111)
Creatinine, Ser: 0.52 mg/dL (ref 0.44–1.00)
GFR, Estimated: >60 mL/min (ref >60)
Glucose, Bld: 112 mg/dL — ABNORMAL HIGH (ref 70–99)
Potassium: 3.7 mmol/L (ref 3.5–5.1)
Sodium: 137 mmol/L (ref 135–145)

## 2021-11-29 NOTE — Progress Notes (Signed)
PROGRESS NOTE   Subjective/Complaints: Has history of COPD: will change oxygen order to maintain between 88 and 92 sats, encouraged incentive spirometer which she says she has been using +low back pain  ROS: Patient denies fever, rash, sore throat, blurred vision, dizziness, nausea, vomiting, diarrhea, cough, shortness of breath or chest pain, +low back pain  Objective:   No results found. Recent Labs    11/29/21 0522  WBC 10.3  HGB 8.3*  HCT 26.3*  PLT 419*   Recent Labs    11/29/21 0522  NA 137  K 3.7  CL 97*  CO2 32  GLUCOSE 112*  BUN 17  CREATININE 0.52  CALCIUM 8.6*    Intake/Output Summary (Last 24 hours) at 11/29/2021 1318 Last data filed at 11/29/2021 0803 Gross per 24 hour  Intake 360 ml  Output 100 ml  Net 260 ml        Physical Exam: Vital Signs Blood pressure 129/85, pulse 83, temperature 97.6 F (36.4 C), temperature source Oral, resp. rate 18, height 5\' 7"  (1.702 m), weight 58.9 kg, SpO2 99 %. Gen: no distress, normal appearing HEENT: oral mucosa pink and moist, NCAT Cardio: Reg rate Chest: normal effort, normal rate of breathing Abd: soft, non-distended Ext: no edema Psych: pleasant, normal affect  Skin: large right brow lac cdi, cervical incision CDI with steristrips. Stable in appearance.  Multiple bruises, including left shoulder Neuro:  alert Oriented to person, place, reason, month/year. Moves all 4's with pain inhibition. No sensory findings. DTR's 1+, no focal CN findings--stable exam Musculoskeletal: pain with passive movement of left shoulder as well as trunk/back. [place] J collar fitting appropriately    Assessment/Plan: 1. Functional deficits which require 3+ hours per day of interdisciplinary therapy in a comprehensive inpatient rehab setting. Physiatrist is providing close team supervision and 24 hour management of active medical problems listed below. Physiatrist and rehab  team continue to assess barriers to discharge/monitor patient progress toward functional and medical goals  Care Tool:  Bathing    Body parts bathed by patient: Abdomen, Chest, Front perineal area, Right upper leg, Left upper leg, Face   Body parts bathed by helper: Buttocks, Right lower leg, Left lower leg, Right arm, Left arm     Bathing assist Assist Level: Maximal Assistance - Patient 24 - 49%     Upper Body Dressing/Undressing Upper body dressing   What is the patient wearing?: Hospital gown only    Upper body assist Assist Level: Total Assistance - Patient < 25%    Lower Body Dressing/Undressing Lower body dressing      What is the patient wearing?: Incontinence brief     Lower body assist Assist for lower body dressing: Maximal Assistance - Patient 25 - 49%     Toileting Toileting Toileting Activity did not occur (Clothing management and hygiene only): N/A (no void or bm)  Toileting assist Assist for toileting: Maximal Assistance - Patient 25 - 49%     Transfers Chair/bed transfer  Transfers assist  Chair/bed transfer activity did not occur: Safety/medical concerns (limited by pain)  Chair/bed transfer assist level: Minimal Assistance - Patient > 75%     Locomotion Ambulation  Ambulation assist      Assist level: 2 helpers   Max distance: 20'   Walk 10 feet activity   Assist     Assist level: Minimal Assistance - Patient > 75%     Walk 50 feet activity   Assist Walk 50 feet with 2 turns activity did not occur: Safety/medical concerns         Walk 150 feet activity   Assist Walk 150 feet activity did not occur: Safety/medical concerns         Walk 10 feet on uneven surface  activity   Assist     Assist level: Minimal Assistance - Patient > 75% Assistive device:  (R hand rail)   Wheelchair     Assist Is the patient using a wheelchair?: No             Wheelchair 50 feet with 2 turns  activity    Assist            Wheelchair 150 feet activity     Assist          Blood pressure 129/85, pulse 83, temperature 97.6 F (36.4 C), temperature source Oral, resp. rate 18, height 5\' 7"  (1.702 m), weight 58.9 kg, SpO2 99 %.  Medical Problem List and Plan: 1. Functional deficits secondary to polytrauma including cervical fx/subluxation s/p ACDF on 11/19/21.              -patient may shower. Will need replacement pads for collar.              -ELOS/Goals: 14-16 days, supervision goals  -Continue CIR therapies including PT, OT    2.  Antithrombotics: -DVT/anticoagulation:  Pharmaceutical: Lovenox BID             -antiplatelet therapy:  N/A 3. Pain Management: decrease tylenol to 650 mg qid              ---off decadron since 2/13.             --continue oxycodone prn.   4. Mood: LCSW to follow for evaluation and support.              -antipsychotic agents: N/A 5. Neuropsych: This patient may be intermittently capable of making decisions on her  own behalf.  -general fatigue, but lethargy better    6. Skin/Wound Care: cervical wound looks great  -can remove right frontal sutures 2/20 or 2/21 7. Fluids/Electrolytes/Nutrition: Monitor I/O.   -encouraging PO -protein supp for low albumin -LFT's normal  8. Cervical fractures C4-C7 s/p ACDF: Collar to stay on for support.  9. Left humeral head/neck fractures: May need Sarmiento brace for support as has not been compliant with sling             --f/u shoulder X rays stable in appearance, still has pain with even minimal movement -ortho to f/u  10. Pre-renal azotemia: Encourage fluid intake.  -Offer supplements between meals.  -BUN/Cr improved 2/15--f/u 2/20 11. Abnormal LFTs: now WNL.  12. Multiple rib fractures/Pulmonary contusion: Fluid overload: Leukocytosis: WBC  13. Acute on chronic anxiety issues:  To avoid benzo's (was d/c) per PCP note.   --currently only on 0.25mg  xanax tid prn -added low dose seroquel  to help with sleep 2/16--seems to have helped             -continue lexapro 5mg  qhs started on admit  -neuropsych to see 14. Urinary retention:  Resolved 15. Fluid overload: Resolved off Diamox  16. Acute blood loss anemia: stable 10 2/15.    -f/u Monday 2/20 17. Active smoker: discussed smoking history, she is determined not to smoke at home 18. COPD: changed oxygen order to maintain sats between 88 and 92% 19. Low back pain: add kpad.     LOS: 6 days A FACE TO FACE EVALUATION WAS PERFORMED  Martha Clan P Oaklyn Mans 11/29/2021, 1:18 PM

## 2021-11-29 NOTE — Discharge Instructions (Addendum)
Inpatient Rehab Discharge Instructions  [name] Discharge date and time:  12/08/21  Activities/Precautions/ Functional Status: Activity: no lifting, driving, or strenuous exercise till cleared by MD Diet: regular diet Wound Care: keep wound clean and dry   Functional status:  ___ No restrictions     ___ Walk up steps independently _X__ 24/7 supervision/assistance   ___ Walk up steps with assistance ___ Intermittent supervision/assistance  ___ Bathe/dress independently ___ Walk with walker     ___ Bathe/dress with assistance ___ Walk Independently    ___ Shower independently ___ Walk with assistance    _X__ Shower with assistance _X__ No alcohol/tobacco     ___ Return to work/school ________  COMMUNITY REFERRALS UPON DISCHARGE:    Outpatient: PT     OT                 Agency:[facility]                        Address: [address], [place], [place] [address]             Appointment Date/Time:*please expect follow-up within 7-10 business days to schedule your appointment. If you have not received follow-up, please be sure to contact the site directly.*  Medical Equipment/Items Ordered: home oxygen/portable tank and 3in1 bedside commode                                                 Agency/Supplier: [employer] #[phone]    Special Instructions: May weight  bear on left arm as tolerated. Use sling for comfort.  Wear collar at all times.   3.  Need to limit use of Xanax and oxycodone. Using narcotics with xanax can put you at risk of respiratory suppression --this has been weaned down and recommend continued wean after discharge.      My questions have been answered and I understand these instructions. I will adhere to these goals and the provided educational materials after my discharge from the hospital.  Patient/Caregiver Signature _______________________________ Date __________  Clinician Signature  _______________________________________ Date __________  Please bring this form and your medication list with you to all your follow-up doctor's appointments.

## 2021-11-29 NOTE — Progress Notes (Signed)
Occupational Therapy Session Note  Patient Details  Name: [name] MRN: [id number] Date of Birth: 1961-05-05  Today's Date: 11/29/2021 OT Individual Time: 7408-1448 OT Individual Time Calculation (min): 73 min    Short Term Goals: Week 1:  OT Short Term Goal 1 (Week 1): Pt will don UB clothing with adaptive technique with min A OT Short Term Goal 2 (Week 1): Pt will complete toileting tasks with mod A OT Short Term Goal 3 (Week 1): Pt will complete toilet transfer with min A OT Short Term Goal 4 (Week 1): Pt's pain will be controlled to <6/10 during 2 consecutive OT sessions  Skilled Therapeutic Interventions/Progress Updates:    Pt greeted seated in recliner and with nurse tech assisting with setting up food. OT took over and pt was able to self feed and manipulate feeding utenisls. She did have some difficulty chewing burger without her dentures in. OT tried to get her some fixident, but we were out. Pt needed assistance to cut up food items and open containers. While finshing lunch, OT discussed OT goals, home set-up, DME needs, barriers, and pt progress. Pt biggest barrier is pain management, although she is doing slightly better about pushing herself. Pt declined to shower, but reported need to go to the bathroom. CGA stand-pivot to [facility] with min A to manage clothing. Pt able to was peri-area with set-up A. OT assist to don new brief after cleaning lower 1/2. Pt took rest break, then agreeable to ambulate in hallway. Pt ambulated with close supervision and intermittent CGA without AD 75 feet. Pt maintained on 3L of O2 while ambulating. Pt returned to room and left set-up with her drink, call bell in reach, LE's elevated in recliner and needs met.   Therapy Documentation Precautions:  Precautions Precautions: Fall, Back, Cervical Required Braces or Orthoses: Sling, Cervical Brace Cervical Brace: Hard collar, At all times Restrictions Weight Bearing Restrictions: Yes LUE Weight  Bearing: Non weight bearing Other Position/Activity Restrictions: immobilizer sling Pain: Pain Assessment Pain Score: 4  In L forearm. Pt stated nursing gave her a lovenox shot in her forarm causing more pain. Rest and repsoitioned   Therapy/Group: Individual Therapy  Valma Cava 11/29/2021, 1:05 PM

## 2021-11-29 NOTE — Progress Notes (Signed)
Physical Therapy Session Note  Patient Details  Name: [name] MRN: [id number] Date of Birth: 1960-12-29  Today's Date: 11/29/2021 PT Individual Time: 5726-2035 PT Individual Time Calculation (min): 71 min   Short Term Goals: Week 1:  PT Short Term Goal 1 (Week 1): Pt will perform bed mobility consistently with CGA. PT Short Term Goal 2 (Week 1): Pt will perform bed to chair consistnetly with CGA. PT Short Term Goal 3 (Week 1): Pt will perform sit to stand consistently with CGA. PT Short Term Goal 4 (Week 1): Pt will ambulate x100' with minA and LRAD. PT Short Term Goal 5 (Week 1): Pt will initiate stair training.  Skilled Therapeutic Interventions/Progress Updates:     Pt received seated in [facility] and agrees to therapy. Reports pain in L arm, 8/10 in severity. RN informed and PT provides rest breaks and bracing as needed, as pt was not wearing arm sling. Stand step transfer to [facility] with minA and cues for initiation, sequencing, and positioning. WC transport to gym for time management. Sit to stand with CGA. Pt ambulates x150' with cues for upright gaze to improve posture and balance, increased R arm swing and trunk rotation to decrease risk for falls. Pt reports sharp increased in L arm pain during ambulation and requests muscle relaxer. Muscle relaxer not available yet so pt takes tylenol. Pt requests to performs Nustep. Pt completes for strength and endurance training, for 10:00 at workload of 3. PT cues to maintain rate >40 steps per minute but pt's average speed is ~25. Pt ambulates additional 175' with minA and same cueing. Pt states she is too fatigued to perform any more bouts of gait. WC transport back to room. Stand step transfer to recliner with CGA. Left with alarm intact and all needs within reach.  Therapy Documentation Precautions:  Precautions Precautions: Fall, Back, Cervical Required Braces or Orthoses: Sling, Cervical Brace Cervical Brace: Hard collar, At all  times Restrictions Weight Bearing Restrictions: Yes LUE Weight Bearing: Non weight bearing Other Position/Activity Restrictions: immobilizer sling   Therapy/Group: Individual Therapy  Breck Coons, PT, DPT 11/29/2021, 3:51 PM

## 2021-11-30 LAB — CBC WITH DIFFERENTIAL/PLATELET
Abs Immature Granulocytes: 0.06 10*3/uL (ref 0.00–0.07)
Basophils Absolute: 0 10*3/uL (ref 0.0–0.1)
Basophils Relative: 0 %
Eosinophils Absolute: 0.2 10*3/uL (ref 0.0–0.5)
Eosinophils Relative: 2 %
HCT: 28 % — ABNORMAL LOW (ref 36.0–46.0)
Hemoglobin: 9 g/dL — ABNORMAL LOW (ref 12.0–15.0)
Immature Granulocytes: 1 %
Lymphocytes Relative: 16 %
Lymphs Abs: 1.6 10*3/uL (ref 0.7–4.0)
MCH: 32 pg (ref 26.0–34.0)
MCHC: 32.1 g/dL (ref 30.0–36.0)
MCV: 99.6 fL (ref 80.0–100.0)
Monocytes Absolute: 0.8 10*3/uL (ref 0.1–1.0)
Monocytes Relative: 8 %
Neutro Abs: 7.4 10*3/uL (ref 1.7–7.7)
Neutrophils Relative %: 73 %
Platelets: 428 10*3/uL — ABNORMAL HIGH (ref 150–400)
RBC: 2.81 MIL/uL — ABNORMAL LOW (ref 3.87–5.11)
RDW: 13.3 % (ref 11.5–15.5)
WBC: 10.2 10*3/uL (ref 4.0–10.5)
nRBC: 0 % (ref 0.0–0.2)

## 2021-11-30 MED ORDER — GABAPENTIN 100 MG PO CAPS
100.0000 mg | ORAL_CAPSULE | Freq: Two times a day (BID) | ORAL | Status: DC
  Administered 2021-11-30 – 2021-12-07 (×15): 100 mg via ORAL
  Filled 2021-11-30 (×15): qty 1

## 2021-11-30 NOTE — Progress Notes (Signed)
°   11/30/21 1845  Oxygen Therapy  SpO2 98 %  O2 Device Nasal Cannula  O2 Flow Rate (L/min) 0.5 L/min   No complications noted

## 2021-11-30 NOTE — Progress Notes (Signed)
Patient napped briefly but was awake most of the night. Patient continues to report a burning/aching pain to left upper extremity. PRN pain meds given with minimal to moderate relief. Patient reports some improvement with KPAD use as well.

## 2021-11-30 NOTE — Progress Notes (Signed)
Occupational Therapy Session Note  Patient Details  Name: [name] MRN: [id number] Date of Birth: Aug 30, 1961  Today's Date: 11/30/2021 OT Individual Time: 0800-0900 OT Individual Time Calculation (min): 60 min    Short Term Goals: Week 1:  OT Short Term Goal 1 (Week 1): Pt will don UB clothing with adaptive technique with min A OT Short Term Goal 2 (Week 1): Pt will complete toileting tasks with mod A OT Short Term Goal 3 (Week 1): Pt will complete toilet transfer with min A OT Short Term Goal 4 (Week 1): Pt's pain will be controlled to <6/10 during 2 consecutive OT sessions  Skilled Therapeutic Interventions/Progress Updates:     Pt received in recliner with LUE pain and agreeable to shower for pain amangement and heat from K pad provided at end of session.   ADL: Pt completes ADL at overall MOD A Level. Skilled interventions include: VC for strategy to doff clothing on toilet seat, VC for close proximity to toilet prior to sitting, sequencing for TTB transfer and VC for spraying water with HH shower head from chest down as pt does not want to change pads/wash hair. Pt requires MAX A for donning new gown, sling and MOD A for donning R sock. Pt reporting family with assist PRN with dressing despite encouragement to try more for herself. Pt grooms at sink with set up for oral care. Total A to comb and style hair  Therapeutic activity  Total A to transport in w/c to sunny hallway per pt request. Pt completes 100x2 of functional mobility with HHA on 2L O2 with 2 LOB with CGA to recover especially in turns.   Pt left at end of session in recliner with exit alarm on, call light in reach and all needs met   Therapy Documentation Precautions:  Precautions Precautions: Fall, Back, Cervical Required Braces or Orthoses: Sling, Cervical Brace Cervical Brace: Hard collar, At all times Restrictions Weight Bearing Restrictions: Yes LUE Weight Bearing: Non weight bearing Other  Position/Activity Restrictions: immobilizer sling    Therapy/Group: Individual Therapy  [name] 11/30/2021, 6:47 AM

## 2021-11-30 NOTE — Progress Notes (Signed)
Patient ID: [name], female   DOB: 1961/04/07, 61 y.o.   MRN: [id number]  SW met with pt in room to provide updates from team conference, and d/c date 3/1. Pt aware SW will follow-up with her dtr [name].   [place] spoke with pt dtr [name] [phone]) to discuss above. Family edu scheduled for Monday (2/27) 9am-12pm with dtr and granddaughter.   Loralee Pacas, MSW, [place] Office: [phone] Cell: [phone] Fax: [phone]
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
## 2021-11-30 NOTE — Progress Notes (Signed)
Physical Therapy Session Note  Patient Details  Name: [name] MRN: [id number] Date of Birth: 1961-08-17  Today's Date: 11/30/2021 PT Individual Time: 1100-1153 Total PT minutes: 53 min   Short Term Goals: Week 1:  PT Short Term Goal 1 (Week 1): Pt will perform bed mobility consistently with CGA. PT Short Term Goal 2 (Week 1): Pt will perform bed to chair consistnetly with CGA. PT Short Term Goal 3 (Week 1): Pt will perform sit to stand consistently with CGA. PT Short Term Goal 4 (Week 1): Pt will ambulate x100' with minA and LRAD. PT Short Term Goal 5 (Week 1): Pt will initiate stair training.  Skilled Therapeutic Interventions/Progress Updates:     Pt received seated in recliner and agrees to therapy. Reports burning pain in L upper arm, but that it seems to be improving. PT provides bracing and rest breaks as needed to manage pain. Pt has nasal cannula in nose but noted to be on 0:/min O2. Oxygen assessed and reads at ~81% with good wave form. PT titrates oxygen to 1L/min and sats improve to ~90% within 1-2 minutes. Pt performs sit to stand with CGA and cues for initiation. PT repositions arm sling for optimal support. Pt ambulates multiple bouts with CGA/minA due to slight posterior bias and increased postural sway. Initially pt ambulates 250' prior to requiring seated rest break. O2 at 81% on 1L O2. PT titrates oxygen to 2L and sats quickly increase to >90%. Following seated rest break, pt completes x8 6' steps with R hand rail and CGA, with cues for sequencing. Seated rest break prior to ambulating additional 250'. Pt ambulates x100' back to room with same assist and cueing, performs toilet transfer with cues for positioning, and ambulates back to chair after performing pericare independently. Left with alarm intact and all needs within reach.  Therapy Documentation Precautions:  Precautions Precautions: Fall, Back, Cervical Required Braces or Orthoses: Sling, Cervical  Brace Cervical Brace: Hard collar, At all times Restrictions Weight Bearing Restrictions: Yes LUE Weight Bearing: Non weight bearing Other Position/Activity Restrictions: immobilizer sling    Therapy/Group: Individual Therapy  Breck Coons, PT, DPT 11/30/2021, 7:57 AM
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
## 2021-12-01 NOTE — Progress Notes (Signed)
Occupational Therapy Weekly Progress Note  Patient Details  Name: [name] MRN: [id number] Date of Birth: [birth year]  Beginning of progress report period: November 24, 2021 End of progress report period: December 01, 2021  Today's Date: 12/01/2021 OT Individual Time: 1403-1430 OT Individual Time Calculation (min): 27 min    Patient has met 2 of 4 short term goals. Patient currently requires Mod A to don UB clothing including sling secondary to pain. Patient able to don LB clothing in figure-4 position with Min A to hike over hips in standing and can complete toilet transfers with CGA. Patient continues to require cues for adherence to spinal precautions. Patient limited by significant pain in L shoulder >8/10 for the last several sessions mildly limiting therapy efforts including standing tolerance and mobility greater than household distances.   Patient continues to demonstrate the following deficits: muscle weakness, decreased cardiorespiratoy endurance and decreased oxygen support, delayed processing and demonstrates behaviors consistent with [place], and decreased sitting balance, decreased standing balance, decreased balance strategies, and difficulty maintaining precautions.and therefore will continue to benefit from skilled OT intervention to enhance overall performance with BADL, iADL, and Reduce care partner burden.  Patient progressing toward long term goals. Continue plan of care.  OT Short Term Goals Week 1:  OT Short Term Goal 1 (Week 1): Pt will don UB clothing with adaptive technique with min A OT Short Term Goal 1 - Progress (Week 1): Progressing toward goal OT Short Term Goal 2 (Week 1): Pt will complete toileting tasks with mod A OT Short Term Goal 2 - Progress (Week 1): Met OT Short Term Goal 3 (Week 1): Pt will complete toilet transfer with min A OT Short Term Goal 3 - Progress (Week 1): Met OT Short Term Goal 4 (Week 1): Pt's pain will be controlled to  <6/10 during 2 consecutive OT sessions OT Short Term Goal 4 - Progress (Week 1): Progressing toward goal Week 2:  OT Short Term Goal 1 (Week 2): Pt will don UB clothing with adaptive technique with min A OT Short Term Goal 2 (Week 2): Pt's pain will be controlled to <6/10 during 2 consecutive OT sessions OT Short Term Goal 3 (Week 2): Pt will don LB clothing with CGA and good recall of spinal precutions. OT Short Term Goal 4 (Week 2): Patient will complete 2/3 grooming tasks in standing with CGA and good adherence to spinal precautions.   Week 1:  OT Short Term Goal 1 (Week 1): Pt will don UB clothing with adaptive technique with min A OT Short Term Goal 2 (Week 1): Pt will complete toileting tasks with mod A OT Short Term Goal 3 (Week 1): Pt will complete toilet transfer with min A OT Short Term Goal 4 (Week 1): Pt's pain will be controlled to <6/10 during 2 consecutive OT sessions  Patient met seated in recliner in agreement with OT treatment session. 7/10 pain reported at rest and with activity in L shoulder. Patient awaiting pain meds with desire to complete treatment session in room. Patient reports inability to recall exactly where her fractures were and what her current precautions are. Patient educated on various fractures and locations with use of Epic. Patient somewhat emotional after education. This Probation officer provided emotional support. Patient then participated in hand/wrist/elbow HEP for 1 set x10 reps each. No increased pain reported with movement. Session concluded with patient seated in recliner with call bell within reach, chair alarm activated and all needs met.   Therapy  Documentation Precautions:  Precautions Precautions: Fall, Back, Cervical Required Braces or Orthoses: Sling, Cervical Brace Cervical Brace: Hard collar, At all times Restrictions Weight Bearing Restrictions: Yes LUE Weight Bearing: Non weight bearing Other Position/Activity Restrictions: immobilizer  sling General:    Therapy/Group: Individual Therapy  [name] R Howerton-Davis 12/01/2021, 6:58 AM

## 2021-12-01 NOTE — Progress Notes (Signed)
Physical Therapy Session Note  Patient Details  Name: [name] MRN: [id number] Date of Birth: 06/29/1961  Today's Date: 12/01/2021 PT Individual Time: 1300-1400 PT Individual Time Calculation (min): 60 min   Short Term Goals: Week 1:  PT Short Term Goal 1 (Week 1): Pt will perform bed mobility consistently with CGA. PT Short Term Goal 1 - Progress (Week 1): Progressing toward goal PT Short Term Goal 2 (Week 1): Pt will perform bed to chair consistnetly with CGA. PT Short Term Goal 2 - Progress (Week 1): Progressing toward goal PT Short Term Goal 3 (Week 1): Pt will perform sit to stand consistently with CGA. PT Short Term Goal 3 - Progress (Week 1): Met PT Short Term Goal 4 (Week 1): Pt will ambulate x100' with minA and LRAD. PT Short Term Goal 4 - Progress (Week 1): Met PT Short Term Goal 5 (Week 1): Pt will initiate stair training. PT Short Term Goal 5 - Progress (Week 1): Met  Skilled Therapeutic Interventions/Progress Updates: Pt presents sitting in recliner and agreeable to therapy, although requests pain meds.  Nursing states unable to have med for 1 hour.  PT utilized rest breaks and distractions for pain management.  Pt requests need to use BR.  Pt amb to BR w/ Min-CGA and w/o AD.  Pt supervised for clothing management and toilet transfer.  Pt amb to sink for handwashing.  Pt amb x 100' to dayroom and CGA to min A.  Pt performed standing marching x 3 trials w/ min A and verbal cues for speed and safety 2/2 multiple LOB.  Pt wheeled to [employer] for change of scenery.  Pt amb x 4 w/o AD and CGA-min A x 150' including turn to return to seat.  Pt amb x 20' into room to recliner and remained sitting.  Chair alarm on and all needs in reach.     Therapy Documentation Precautions:  Precautions Precautions: Fall, Back, Cervical Required Braces or Orthoses: Sling, Cervical Brace Cervical Brace: Hard collar, At all times Restrictions Weight Bearing Restrictions: Yes LUE  Weight Bearing: Non weight bearing Other Position/Activity Restrictions: immobilizer sling General:   Vital Signs: Therapy Vitals Temp: 98.6 F (37 C) Temp Source: Oral Pulse Rate: 86 Resp: 17 BP: 103/62 Patient Position (if appropriate): Sitting Oxygen Therapy SpO2: 95 % O2 Device: Nasal Cannula O2 Flow Rate (L/min): 1 L/min Pain:8/10 and is due for pain meds in 1 hour. Pain Assessment Pain Scale: 0-10 Pain Score: 7  Pain Type: Acute pain Pain Location: Arm Pain Orientation: Left Pain Descriptors / Indicators: Aching;Dull;Discomfort;Sore Pain Frequency: Constant Pain Onset: On-going Pain Intervention(s): Medication (See eMAR) Mobility:       Therapy/Group: Individual Therapy  Ladoris Gene 12/01/2021, 2:01 PM

## 2021-12-01 NOTE — Progress Notes (Signed)
Occupational Therapy Session Note  Patient Details  Name: [name] MRN: [id number] Date of Birth: 1961/02/12  Today's Date: 12/01/2021 OT Individual Time: 3419-6222 OT Individual Time Calculation (min): 58 min    Short Term Goals: Week 1:  OT Short Term Goal 1 (Week 1): Pt will don UB clothing with adaptive technique with min A OT Short Term Goal 2 (Week 1): Pt will complete toileting tasks with mod A OT Short Term Goal 3 (Week 1): Pt will complete toilet transfer with min A OT Short Term Goal 4 (Week 1): Pt's pain will be controlled to <6/10 during 2 consecutive OT sessions  Skilled Therapeutic Interventions/Progress Updates:  Patient met seated in recliner. RN present for med administration. Patient in agreement with OT treatment session. 7/10 pain reported at rest and with activity in L shoulder. Patient donned LB clothing seated in recliner in figure-4 position with Min A to hike over hips in standing. Increased time/effort and cues for technique. Patient requires frequent and rest breaks 2/2 anxiety. RN states patient received anti-anxiety meds this a.m. Functional mobility several bouts x30-27f with CGA to supervision A and no AD. In therapy gym patient engaged in dynamic standing balance activity but unable to tolerate standing for >60 sec. Total A for wc transport back to room for time management. Session concluded with patient seated in recliner with call bell within reach, chair alarm activated and all needs met.   Therapy Documentation Precautions:  Precautions Precautions: Fall, Back, Cervical Required Braces or Orthoses: Sling, Cervical Brace Cervical Brace: Hard collar, At all times Restrictions Weight Bearing Restrictions: Yes LUE Weight Bearing: Non weight bearing Other Position/Activity Restrictions: immobilizer sling General:   Therapy/Group: Individual Therapy  Ceyda Peterka R Howerton-Davis 12/01/2021, 6:59 AM

## 2021-12-01 NOTE — Progress Notes (Signed)
Physical Therapy Weekly Progress Note  Patient Details  Name: [name] MRN: [id number] Date of Birth: Jun 02, 1961  Beginning of progress report period: November 24, 2021 End of progress report period: December 01, 2021  Today's Date: 12/01/2021 PT Individual Time: 6378-5885 PT Individual Time Calculation (min): 55 min   Patient has met 3 of 5 short term goals.  Pt is progressing well toward mobility goals, improving independence with functional transfers, balance, and ambulation. Pt at times still requires assistance for bed to chair transfer secondary to balance deficits and continues to require minA for bed mobility. Pt has been somewhat limited by pain management but is progressing with functional mobility and should be appropriate for discharge on scheduled day. Pt will benefit from family education prior to DC.  Patient continues to demonstrate the following deficits muscle weakness, decreased cardiorespiratoy endurance and decreased oxygen support, and decreased standing balance, decreased balance strategies, and difficulty maintaining precautions and therefore will continue to benefit from skilled PT intervention to increase functional independence with mobility.  Patient progressing toward long term goals..  Continue plan of care.  PT Short Term Goals Week 1:  PT Short Term Goal 1 (Week 1): Pt will perform bed mobility consistently with CGA. PT Short Term Goal 1 - Progress (Week 1): Progressing toward goal PT Short Term Goal 2 (Week 1): Pt will perform bed to chair consistnetly with CGA. PT Short Term Goal 2 - Progress (Week 1): Progressing toward goal PT Short Term Goal 3 (Week 1): Pt will perform sit to stand consistently with CGA. PT Short Term Goal 3 - Progress (Week 1): Met PT Short Term Goal 4 (Week 1): Pt will ambulate x100' with minA and LRAD. PT Short Term Goal 4 - Progress (Week 1): Met PT Short Term Goal 5 (Week 1): Pt will initiate stair training. PT Short Term  Goal 5 - Progress (Week 1): Met Week 2:  PT Short Term Goal 1 (Week 2): STGs = LTGs  Skilled Therapeutic Interventions/Progress Updates:  Ambulation/gait training;Community reintegration;DME/adaptive equipment instruction;Neuromuscular re-education;Psychosocial support;Stair training;UE/LE Strength taining/ROM;Wheelchair propulsion/positioning;Balance/vestibular training;Discharge planning;Functional electrical stimulation;Pain management;Skin care/wound management;Therapeutic Activities;UE/LE Coordination activities;Cognitive remediation/compensation;Disease management/prevention;Functional mobility training;Patient/family education;Splinting/orthotics;Therapeutic Exercise;Visual/perceptual remediation/compensation   Pt received seated in recliner and agrees to therapy. Reports pain in L upper extremity, burning in nature. Number not provided. PT provides rest breaks as needed to manage pain. Pt reports need for bowel movement. Ambulatory transfer to toilet with CGA and cues for positioning. Pt continent of bowel and requests female NR for pericare. Following, pt transfer to [facility] with CGA. WC transport to gym for time management  Pt completes x8:00 on Nustep at workload of 3 with average steps per minute ~25 (PT had cued to maintain >40). Performed for strength and endurance training. Cues provided for full ROM to promote optimal benefit from activity. Following seated rest break, pt ambulates ~500' with CGA and cues for upright gaze to improve posture and balance, increasing gait speed and stride length to decrease risk for falls, and sequencing and positioning for transfer back to WC. Following, PT cues pt for pursed lip breathing to optimize oxygen sats.  Pt left seated in recliner with alarm intact and all needs within reach.   Therapy Documentation Precautions:  Precautions Precautions: Fall, Back, Cervical Required Braces or Orthoses: Sling, Cervical Brace Cervical Brace: Hard collar, At all  times Restrictions Weight Bearing Restrictions: Yes LUE Weight Bearing: Non weight bearing Other Position/Activity Restrictions: immobilizer sling   Therapy/Group: Individual Therapy  Breck Coons,  PT, DPT 12/01/2021, 9:34 AM

## 2021-12-01 NOTE — Progress Notes (Signed)
PROGRESS NOTE   Subjective/Complaints:  Discussed discharge date, Amb 500' with O2 some inreased cough DOE, nebs x 1 yest  ROS: Patient denies fever, rash, sore throat, blurred vision, dizziness, nausea, vomiting, diarrhea, cough,  chest pain, -low back pain  Objective:   No results found. Recent Labs    11/29/21 0522 11/30/21 0532  WBC 10.3 10.2  HGB 8.3* 9.0*  HCT 26.3* 28.0*  PLT 419* 428*    Recent Labs    11/29/21 0522  NA 137  K 3.7  CL 97*  CO2 32  GLUCOSE 112*  BUN 17  CREATININE 0.52  CALCIUM 8.6*     Intake/Output Summary (Last 24 hours) at 12/01/2021 0837 Last data filed at 11/30/2021 2321 Gross per 24 hour  Intake 460 ml  Output 150 ml  Net 310 ml         Physical Exam: Vital Signs Blood pressure 103/66, pulse 85, temperature 98.5 F (36.9 C), temperature source Oral, resp. rate 15, height 5\' 7"  (1.702 m), weight 58.9 kg, SpO2 95 %.  General: No acute distress Mood and affect are appropriate Heart: Regular rate and rhythm no rubs murmurs or extra sounds Lungs: Clear to auscultation, breathing unlabored, scattered wheezes Abdomen: Positive bowel sounds, soft nontender to palpation, nondistended Extremities: No clubbing, cyanosis, or edema    Skin: large right brow lac cdi, cervical incision CDI with steristrips. Stable in appearance.  Multiple bruises, including left shoulder Neuro:  alert Oriented to person, place, reason, month/year. Moves all 4's with pain inhibition. No sensory findings. DTR's 1+, no focal CN findings--stable exam Musculoskeletal: pain with passive movement of left shoulder as well as trunk/back. [place] J collar fitting appropriately    Assessment/Plan: 1. Functional deficits which require 3+ hours per day of interdisciplinary therapy in a comprehensive inpatient rehab setting. Physiatrist is providing close team supervision and 24 hour management of active medical  problems listed below. Physiatrist and rehab team continue to assess barriers to discharge/monitor patient progress toward functional and medical goals  Care Tool:  Bathing    Body parts bathed by patient: Abdomen, Chest, Front perineal area, Right upper leg, Left upper leg, Face   Body parts bathed by helper: Buttocks, Right lower leg, Left lower leg, Right arm, Left arm     Bathing assist Assist Level: Maximal Assistance - Patient 24 - 49%     Upper Body Dressing/Undressing Upper body dressing   What is the patient wearing?: Hospital gown only    Upper body assist Assist Level: Total Assistance - Patient < 25%    Lower Body Dressing/Undressing Lower body dressing      What is the patient wearing?: Incontinence brief     Lower body assist Assist for lower body dressing: Maximal Assistance - Patient 25 - 49%     Toileting Toileting Toileting Activity did not occur (Clothing management and hygiene only): N/A (no void or bm)  Toileting assist Assist for toileting: Maximal Assistance - Patient 25 - 49%     Transfers Chair/bed transfer  Transfers assist  Chair/bed transfer activity did not occur: Safety/medical concerns (limited by pain)  Chair/bed transfer assist level: Minimal Assistance - Patient > 75%  Locomotion Ambulation   Ambulation assist      Assist level: 2 helpers   Max distance: 20'   Walk 10 feet activity   Assist     Assist level: Minimal Assistance - Patient > 75%     Walk 50 feet activity   Assist Walk 50 feet with 2 turns activity did not occur: Safety/medical concerns         Walk 150 feet activity   Assist Walk 150 feet activity did not occur: Safety/medical concerns         Walk 10 feet on uneven surface  activity   Assist     Assist level: Minimal Assistance - Patient > 75% Assistive device:  (R hand rail)   Wheelchair     Assist Is the patient using a wheelchair?: No              Wheelchair 50 feet with 2 turns activity    Assist            Wheelchair 150 feet activity     Assist          Blood pressure 103/66, pulse 85, temperature 98.5 F (36.9 C), temperature source Oral, resp. rate 15, height 5\' 7"  (1.702 m), weight 58.9 kg, SpO2 95 %.  Medical Problem List and Plan: 1. Functional deficits secondary to polytrauma including cervical fx/subluxation s/p ACDF on 11/19/21.              -patient may shower. Will need replacement pads for collar.              -ELOS/Goals: 14-16 days,    -Continue CIR therapies including PT, OT    2.  Antithrombotics: -DVT/anticoagulation:  Pharmaceutical: Lovenox BID             -antiplatelet therapy:  N/A 3. Pain Management: decrease tylenol to 650 mg qid              ---LUE burning pain likely neuropathic cervical radic vs brachial plexopathy, start gabapentin 100mg  BID no c/os today  4. Mood: LCSW to follow for evaluation and support.              -antipsychotic agents: N/A 5. Neuropsych: This patient may be intermittently capable of making decisions on her  own behalf.  -general fatigue, but lethargy better    6. Skin/Wound Care: cervical wound looks great  -can remove right frontal sutures 2/20 or 2/21 7. Fluids/Electrolytes/Nutrition: Monitor I/O.   -encouraging PO -protein supp for low albumin -LFT's normal  8. Cervical fractures C4-C7 s/p ACDF: Collar to stay on for support.  9. Left humeral head/neck fractures: May need Sarmiento brace for support as has not been compliant with sling             --f/u shoulder X rays stable in appearance, still has pain with even minimal movement -ortho to f/u  10. Pre-renal azotemia: Encourage fluid intake.  - BMP Latest Ref Rng & Units 11/29/2021 11/24/2021 11/23/2021  Glucose 70 - 99 mg/dL 112(H) 120(H) 129(H)  BUN 8 - 23 mg/dL 17 21 29(H)  Creatinine 0.44 - 1.00 mg/dL 0.52 0.58 0.70  BUN/Creat Ratio 6 - 22 (calc) - - -  Sodium 135 - 145 mmol/L 137 137 136   Potassium 3.5 - 5.1 mmol/L 3.7 3.7 3.5  Chloride 98 - 111 mmol/L 97(L) 104 103  CO2 22 - 32 mmol/L 32 26 26  Calcium 8.9 - 10.3 mg/dL 8.6(L) 8.7(L) 9.1  Resolved 2/21  12. Multiple rib fractures/Pulmonary contusion: Fluid overload: Leukocytosis: WBC  13. Acute on chronic anxiety issues:  To avoid benzo's (was d/c) per PCP note.   --currently only on 0.25mg  xanax tid prn -added low dose seroquel to help with sleep 2/16--seems to have helped             -continue lexapro 5mg  qhs started on admit  -neuropsych to see 14. Urinary retention:              Resolved 15. Fluid overload: Resolved off Diamox  16. Acute blood loss anemia: stable 10 2/15.    -f/u Monday 2/20 17. Active smoker: discussed smoking history, she is determined not to smoke at home 18. COPD: changed oxygen order to maintain sats between 88 and 92%- prn ventolin 19. Low back pain: no c/os today add kpad.     LOS: 8 days A FACE TO FACE EVALUATION WAS PERFORMED  [name] 12/01/2021, 8:37 AM

## 2021-12-02 NOTE — Progress Notes (Signed)
Occupational Therapy Session Note  Patient Details  Name: [name] MRN: [id number] Date of Birth: September 12, 1961  Today's Date: 12/02/2021 OT Individual Time: 1050-1202 OT Individual Time Calculation (min): 72 min    Short Term Goals: Week 2:  OT Short Term Goal 1 (Week 2): Pt will don UB clothing with adaptive technique with min A OT Short Term Goal 2 (Week 2): Pt's pain will be controlled to <6/10 during 2 consecutive OT sessions OT Short Term Goal 3 (Week 2): Pt will don LB clothing with CGA and good recall of spinal precutions. OT Short Term Goal 4 (Week 2): Patient will complete 2/3 grooming tasks in standing with CGA and good adherence to spinal precautions.  Skilled Therapeutic Interventions/Progress Updates:    Pt greeted seated in recliner and argeeable to OT treatment session. Pt requesting to shower today. OT already obtained replacement [place] J pads for shower. Pt maintained on 2L of O2 throughout session. Pt ambulated to bathroom with CGA, voided bladder and needed CGA for balance when managing clothing. CGA to ambulate into shower.  OT educated on figure 4 position to wash lower legs, then pt able to stand with CGA to wash peri-area and buttocks. Limited ROM of L UE due to fx and pain. Pt also needed assist from OT to thoroughly wash and rinse hair with one hand. Ambulatory out of shower to EOB for dressing tasks. OT educated on adapted strategies to don underwear and pants, but still needed min A. Pt brought into supine for OT to change cervical collar braces and wash neck. OT explained that we will train her daughter on managing cervical collar when showering. OT exchanged pads and washed old pads, and replaced c-collar. She then transitioned back to EOB with CGA and HOB raised. OT educated on donning t-shirt by threading L UE first, then getting shirt overhead. Mod A for this task. PT able to help comb hair, then OT assist to braid and get hair out of brace. Pt pivoted over to  recliner with supervision and left semi-reclined in recliner with call bell in reach and needs met.   Therapy Documentation Precautions:  Precautions Precautions: Fall, Back, Cervical Required Braces or Orthoses: Sling, Cervical Brace Cervical Brace: Hard collar, At all times Restrictions Weight Bearing Restrictions: Yes LUE Weight Bearing: Non weight bearing Other Position/Activity Restrictions: immobilizer sling  Pain: Pt reported pain was managed  Therapy/Group: Individual Therapy  Valma Cava 12/02/2021, 1:42 PM

## 2021-12-02 NOTE — Progress Notes (Signed)
Physical Therapy Session Note  Patient Details  Name: [name] MRN: [id number] Date of Birth: [birth year]   Short Term Goals: Week 2:  PT Short Term Goal 1 (Week 2): STGs = LTGs  Skilled Therapeutic Interventions/Progress Updates:     Pt received seated in recliner and agrees to therapy. No complaint of pain. Sit to stand and stand step transfer to [facility] with CGA and cues for positioning. Pt's oxygen monitored and noted to be 94% on 2L/min O2. WC transport outside for time management. Pt ambulates outdoors for gait training over unlevel and varying surfaces. Pt ambulates x200' with minA, completing x5 6" steps with R hand rail, and ambulating up down graded sidewalk with cues for upright gaze to improve posture and balance, and increasing gait speed to decrease risk for falls.  Pt performs alternating toe taps on 6 inch step to challenge dynamic balance. First set x10 with each leg requires minA and cues for positioning and posture. Following seated rest break, pt performs additional x10 with each leg and CGA, with improved stability. Pt performs final bout of toe taps with same assist. WC transport back to room. Pt ambulates to toilet with cues for positioning. Pt is continent of bladder and independent with pericare. Left seated with alarm intact and all needs within reach.  Therapy Documentation Precautions:  Precautions Precautions: Fall, Back, Cervical Required Braces or Orthoses: Sling, Cervical Brace Cervical Brace: Hard collar, At all times Restrictions Weight Bearing Restrictions: Yes LUE Weight Bearing: Non weight bearing Other Position/Activity Restrictions: immobilizer sling    Therapy/Group: Individual Therapy  Breck Coons, PT, DPT 12/02/2021, 10:11 AM

## 2021-12-02 NOTE — Progress Notes (Signed)
Occupational Therapy Session Note  Patient Details  Name: [name] MRN: [id number] Date of Birth: [birth year]  Today's Date: 12/02/2021 OT Individual Time: 1300-1400 OT Individual Time Calculation (min): 60 min    Short Term Goals: Week 1:  OT Short Term Goal 1 (Week 1): Pt will don UB clothing with adaptive technique with min A OT Short Term Goal 1 - Progress (Week 1): Progressing toward goal OT Short Term Goal 2 (Week 1): Pt will complete toileting tasks with mod A OT Short Term Goal 2 - Progress (Week 1): Met OT Short Term Goal 3 (Week 1): Pt will complete toilet transfer with min A OT Short Term Goal 3 - Progress (Week 1): Met OT Short Term Goal 4 (Week 1): Pt's pain will be controlled to <6/10 during 2 consecutive OT sessions OT Short Term Goal 4 - Progress (Week 1): Progressing toward goal  Skilled Therapeutic Interventions/Progress Updates:     Pt received in bed with neck pain unrated, but went outside for sunshine and mood support  Therapeutic activity Spent significant time talking about anxiety, loss of control, coping, neuropsuch referral and discharge planning. Pt states she will have a lot of help at home and has not been worrying about what she will need to do at home. Pt states that she is mostly worrried and anxious about getting meds on time, having needed items in room and not having door closed. Provided therapuetic use of self to validate feelings and discuss changes in function and impact on self image. Pt verbalized understanding and appreciative. Pt completes functional mobility with no AD and sling with CGA in outisde courtyard working on turning 180 degrees and walking over uneven surfaces. Pt with no LOB. Pt completes car transfer with supervision after encouragement. Pt with no visible anxiety reaction and states she feels "ok."  Toileting with CGA completed.  Pt left at end of session in bed with exit alarm on, call light in reach and all needs  met   Therapy Documentation Precautions:  Precautions Precautions: Fall, Back, Cervical Required Braces or Orthoses: Sling, Cervical Brace Cervical Brace: Hard collar, At all times Restrictions Weight Bearing Restrictions: Yes LUE Weight Bearing: Non weight bearing Other Position/Activity Restrictions: immobilizer sling General:     Therapy/Group: Individual Therapy  [name] 12/02/2021, 7:15 AM

## 2021-12-03 DIAGNOSIS — F410 Panic disorder [episodic paroxysmal anxiety] without agoraphobia: Secondary | ICD-10-CM
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
## 2021-12-03 NOTE — Progress Notes (Signed)
Physical Therapy Session Note  Patient Details  Name: [name] MRN: [id number] Date of Birth: 07-29-1961  Today's Date: 12/03/2021 PT Individual Time: 1100-1200 PT Individual Time Calculation (min): 60 min   Short Term Goals: Week 2:  PT Short Term Goal 1 (Week 2): STGs = LTGs  Skilled Therapeutic Interventions/Progress Updates: Pt presents sitting in recliner and agreeable to therapy.  Pt transfers sit to stand w/ CGA.  Pt amb to w/c x 4' w/ close supervision.  Pt wheeled outside for time and energy conservation.  Pt amb w/o AD and CGA up to 150' on ramped surfaces including turns to return to seat.  Pt transfers sit to stand from bench seat w/ CGA.  Pt returned to main gym.  Pt performed standing on [employer] cushion for eyes closed, trunk rotation  as well as stepping on/off cushion, forward, back and side to side.  Pt amb back to room and into BR w/ close supervision. Pt required supervision for clothing management and pericare.  NT notified.  Pt returned to recliner, elevated LEs and seat alarm on and all needs in reach.     Therapy Documentation Precautions:  Precautions Precautions: Fall, Back, Cervical Required Braces or Orthoses: Sling, Cervical Brace Cervical Brace: Hard collar, At all times Restrictions Weight Bearing Restrictions: Yes LUE Weight Bearing: Non weight bearing Other Position/Activity Restrictions: immobilizer sling General:   Vital Signs:   Pain:8/10 low back. Pain Assessment Pain Scale: 0-10 Pain Score: 5  Faces Pain Scale: Hurts a little bit Pain Type: Acute pain Pain Location: Arm Pain Orientation: Left (back) Pain Descriptors / Indicators: Aching Pain Intervention(s): Medication (See eMAR) PAINAD (Pain Assessment in Advanced Dementia) Breathing: normal Negative Vocalization: none Body Language: relaxed Consolability: no need to console Mobility:         Therapy/Group: Individual Therapy  Ladoris Gene 12/03/2021, 12:19 PM
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
## 2021-12-03 NOTE — Progress Notes (Signed)
Occupational Therapy Session Note  Patient Details  Name: [name] MRN: [id number] Date of Birth: 1961/06/20  Today's Date: 12/03/2021 OT Individual Time: 801-857 OT Individual Time Calculation (min):  min    Today's Date: 12/03/2021 OT Individual Time: 4098-1191 OT Individual Time Calculation (min): 23 min   Short Term Goals: Week 1:  OT Short Term Goal 1 (Week 1): Pt will don UB clothing with adaptive technique with min A OT Short Term Goal 1 - Progress (Week 1): Progressing toward goal OT Short Term Goal 2 (Week 1): Pt will complete toileting tasks with mod A OT Short Term Goal 2 - Progress (Week 1): Met OT Short Term Goal 3 (Week 1): Pt will complete toilet transfer with min A OT Short Term Goal 3 - Progress (Week 1): Met OT Short Term Goal 4 (Week 1): Pt's pain will be controlled to <6/10 during 2 consecutive OT sessions OT Short Term Goal 4 - Progress (Week 1): Progressing toward goal  Skilled Therapeutic Interventions/Progress Updates:    Session 1: Pt received in recliner agreeable to OT and finished with breakfast. OT repositioned sling as it was on incorrectly. Pt toilets with superviison and no LOB turning to sit onto toilet  Therapeutic activity Obstacle course stepping up onto 4 inch step, over compliant mat, walking with alternating cone taps to improve 1LE stance, over 2 objects on ground to mimic cords and around a conewiht 180 deg turn to mimic community/in home mobility around obstacles on 1L O2- First trial decr to 88% with increased in 10 sec with pursed lip breathing to 93% Second trial- took 1 min 55 sec and O2 dropped to 86% on 1L increased to 93% after 1.5 min  Pt completes kitchen search into various height cabinets/appliaces in prep for IADL retraining from amb level with no AD level with min cuing for safety/positioning throughout environment. Pt uses no AE, but is cued to slide bowl to gather irems to improve reach/safety and transportation of items  with education on energy conservation techniques throughout activity  Pt left at end of session in recliner with exit alarm on, call light in reach and all needs met   Session 2:  Pt received in recliner requesting breathing treatment+Xanax but no pain. Pt reporting feeling anxious and OT and pt have discussion on ways pt will be able to self sooth at home and cope with anxiety. Pt able to have collaborative discussion to make a list of the following ideas: -petting her dog -listening to calming or familiar music -breathing techiniques- with education on extended exhale for calming the CNS -coloring Pt left at end of session in bed with exit alarm on, call light in reach and all needs met   Therapy Documentation Precautions:  Precautions Precautions: Fall, Back, Cervical Required Braces or Orthoses: Sling, Cervical Brace Cervical Brace: Hard collar, At all times Restrictions Weight Bearing Restrictions: Yes LUE Weight Bearing: Non weight bearing Other Position/Activity Restrictions: immobilizer sling General:     Therapy/Group: Individual Therapy  [name] 12/03/2021, 6:45 AM

## 2021-12-03 NOTE — Progress Notes (Signed)
Occupational Therapy Session Note  Patient Details  Name: [name] MRN: [id number] Date of Birth: 01-05-1961  Today's Date: 12/03/2021 OT Individual Time: 1345-1425 OT Individual Time Calculation (min): 40 min    Short Term Goals: Week 1:  OT Short Term Goal 1 (Week 1): Pt will don UB clothing with adaptive technique with min A OT Short Term Goal 1 - Progress (Week 1): Progressing toward goal OT Short Term Goal 2 (Week 1): Pt will complete toileting tasks with mod A OT Short Term Goal 2 - Progress (Week 1): Met OT Short Term Goal 3 (Week 1): Pt will complete toilet transfer with min A OT Short Term Goal 3 - Progress (Week 1): Met OT Short Term Goal 4 (Week 1): Pt's pain will be controlled to <6/10 during 2 consecutive OT sessions OT Short Term Goal 4 - Progress (Week 1): Progressing toward goal  Skilled Therapeutic Interventions/Progress Updates:    1:1 Pt received in the recliner; pt reporting pain and uncomfortable and anxious. Pt finishing a breathing treatment. Pt ambulated to the bathroom with CGA and toileted with supervision. Pt ambulated around the unit ~ 100 feet with contact guard with 2 lit of O2 before requesting to lay down to rest in bed. Difficulty finding a comfortable place- assisted with pillows. LEft resting in bed with bed alarm on.   Therapy Documentation Precautions:  Precautions Precautions: Fall, Back, Cervical Required Braces or Orthoses: Sling, Cervical Brace Cervical Brace: Hard collar, At all times Restrictions Weight Bearing Restrictions: Yes LUE Weight Bearing: Non weight bearing Other Position/Activity Restrictions: immobilizer sling General: General OT Amount of Missed Time: 5 Minutes due to fatigue and pain  Vital Signs: Therapy Vitals Resp: 18 BP: 103/67 Patient Position (if appropriate): Lying Oxygen Therapy SpO2: 90 % O2 Device: Room Air O2 Flow Rate (L/min): 2 L/min Pain: Pt reports 8/10 in middle of back in in neck. Got up to  move around and then transitioned to laying down in bed instead of sitting up in recliner    Therapy/Group: Individual Therapy  [facility] 12/03/2021, 2:48 PM

## 2021-12-04 NOTE — Progress Notes (Signed)
Occupational Therapy Session Note  Patient Details  Name: [name] MRN: [id number] Date of Birth: [birth year]  Today's Date: 12/04/2021 OT Individual Time: 1400-1430 OT Individual Time Calculation (min): 30 min    Short Term Goals: Week 1:  OT Short Term Goal 1 (Week 1): Pt will don UB clothing with adaptive technique with min A OT Short Term Goal 1 - Progress (Week 1): Progressing toward goal OT Short Term Goal 2 (Week 1): Pt will complete toileting tasks with mod A OT Short Term Goal 2 - Progress (Week 1): Met OT Short Term Goal 3 (Week 1): Pt will complete toilet transfer with min A OT Short Term Goal 3 - Progress (Week 1): Met OT Short Term Goal 4 (Week 1): Pt's pain will be controlled to <6/10 during 2 consecutive OT sessions OT Short Term Goal 4 - Progress (Week 1): Progressing toward goal  Skilled Therapeutic Interventions/Progress Updates:     Pt received in w/c with mild pain in shoulder and repositioned with sling with improvement in pain.  ADL: Pt completes ADL at overall supervision Level for toileting and changing pants/underwear. Skilled interventions include: VC for weight shift forward when walking up ramp threshold into the bathroom, A to manage lines, and VC for threading more painful leg first. Pt able to complete 3/3 components of toileting with supervision  Therapeutic activity Pt completes functional mobility with sling and no AD to/from vending machine with seated rest break at vending machine. Pt attempts to pick up item from low shelf but OT cued not to d/t neck and shoulder fx/precations. Pt verbalized understanding.  Pt left at end of session in bed with exit alarm on, call light in reach and all needs met. 4 rails up per pt request   Therapy Documentation Precautions:  Precautions Precautions: Fall, Back, Cervical Required Braces or Orthoses: Sling, Cervical Brace Cervical Brace: Hard collar, At all times Restrictions Weight Bearing  Restrictions: Yes LUE Weight Bearing: Non weight bearing Other Position/Activity Restrictions: immobilizer sling General:    Therapy/Group: Individual Therapy  [name] 12/04/2021, 6:52 AM

## 2021-12-04 NOTE — Progress Notes (Signed)
Physical Therapy Session Note  Patient Details  Name: [name] MRN: [id number] Date of Birth: 28-Aug-1961  Today's Date: 12/04/2021 PT Individual Time: [id number] and 1705-1730 PT Individual Time Calculation (min): 42 min and 25 min  Short Term Goals: Week 2:  PT Short Term Goal 1 (Week 2): STGs = LTGs  Skilled Therapeutic Interventions/Progress Updates:    Session 1: Pt received supine in bed and agreeable to therapy session. Received and maintained on 2L of O2 via nasal cannula - SpO2 96% after toileting but later in session decreased to 88% after ambulation recovering to 92% in < 30 seconds with cuing for nasal inhalation.   Pt received wearing hard cervical collar; however, pt reports it needs to be tightened as she had loosened it over night (collar visibly loose). Therapist laid [facility] flat instructing pt to keep cervical spine still while therapist tightened brace 1 side at a time to ensure it stayed in position to support the neck. Therapist also fastened L UE sling as it was donned but the strap around pt's neck was not Velcroed together.   Supine>sitting R EOB, therapist had to elevated HOB partially and pt using bedrail, with supervision. Sit<>stands, no AD, with CGA for safety/steadying throughout session. Gait in/out bathroom, no AD, with CGA - demos slight instability but no LOB and pt intermittently using R UE support for balance. Standing with CGA managed LB clothing with min assist for L side. Continent of bladder and seated peri-care with set-up assist. Standing hand hygiene with CGA/supervision.  Pt requesting to be transported down to main hospital level to retrieve money from ATM for her granddaughter's birthday. Transported in w/c and pt able to retreive money without any assist from therapist - at end of session ensured pt's credit card and cash were safely placed in her wallet in her purse in the room.  Gait training ~76f to/from vending machine, no AD, with CGA for  steadying and pt able to select item from machine and collect it with only CGA for safety. Pt reports increased back pain after ambulating - seated rest break provided for pain management with improvement in symptoms.  Gait training additional ~667f no AD, with CGA for steadying. Transported back to room and pt left seated in w/c with needs in reach, lines intact, bracing in place, and seat belt alarm on.   Session 2: Pt received sitting in w/c and agreeable to therapy session, but reports need to use bathroom. Pt wearing hard cervical collar and L UE sling throughout session. Received and maintained on 2L of O2 via nasal cannula throughout session. Continues to report having pain in her back and ribs, nurse notified and present at end of session for medication administration. Sit<>stands, no AD, with CGA for steadying throughout session. Gait ~1553f2 in/out bathroom, no AD, with CGA for steadying. Standing with close supervision/CGA managed LB clothing with min assist for L side. Continent of bladder and performed seated peri-care without assist - pt reports feeling "funny" after voiding bladder and expresses concerns of possible UTI - nurse notified and present to assess with pt describing a "tingling" feeling after urinating. Standing hand hygiene with CGA for safety. Gait training ~[id number] AD, with CGA for steadying while nurse gathered supplies - pt continues to demo slight instability but no overt LOB. Pt returned to bed for nurse to perform bladder scan and assess for signs of the "tingling" feeling. Sit>supine, HOB elevated, with supervision. Pt requesting to sit upright in w/c for  meal. Supine>sit with min assist for trunk upright from lower HOB height due to increased pain. Stand pivot to w/c, no AD, with CGA. Pt left seated in w/c with lines intact, bracing in place, seat belt alarm on, needs in reach, and meal tray set-up.  Therapy Documentation Precautions:  Precautions Precautions: Fall,  Back, Cervical Required Braces or Orthoses: Sling, Cervical Brace Cervical Brace: Hard collar, At all times Restrictions Weight Bearing Restrictions: Yes LUE Weight Bearing: Non weight bearing Other Position/Activity Restrictions: immobilizer sling   Pain: Session 1: Reports mid-back pain, unrated - therapist provides pt with seated rest breaks, distraction, and repositioning for pain management.   Session 2: Continues to report back pain but states she is hoping sitting more upright in the wheelchair will help with the pain - nurse notified for medication administration.    Therapy/Group: Individual Therapy  Tawana Scale , PT, DPT, NCS, CSRS  12/04/2021, 7:45 AM

## 2021-12-05 DIAGNOSIS — M792 Neuralgia and neuritis, unspecified: Secondary | ICD-10-CM

## 2021-12-05 DIAGNOSIS — D62 Acute posthemorrhagic anemia: Secondary | ICD-10-CM

## 2021-12-05 DIAGNOSIS — F411 Generalized anxiety disorder: Secondary | ICD-10-CM

## 2021-12-05 NOTE — Progress Notes (Signed)
PROGRESS NOTE   Subjective/Complaints: Patient seen sitting up in bed this morning.  She states she slept well overnight.  She states she feels like she is doing better.  She notes her anxiety is improving  ROS: Patient denies fever, rash, sore throat, blurred vision, dizziness, nausea, vomiting, diarrhea, cough,  chest pain, -low back pain  Objective:   No results found. No results for input(s): WBC, HGB, HCT, PLT in the last 72 hours.  No results for input(s): NA, K, CL, CO2, GLUCOSE, BUN, CREATININE, CALCIUM in the last 72 hours.   Intake/Output Summary (Last 24 hours) at 12/05/2021 0821 Last data filed at 12/05/2021 0716 Gross per 24 hour  Intake 580 ml  Output 300 ml  Net 280 ml         Physical Exam: Vital Signs Blood pressure 116/70, pulse 91, temperature 97.7 F (36.5 C), resp. rate 16, height 5\' 7"  (1.702 m), weight 58.9 kg, SpO2 97 %.  General: No acute distress Head: Right forehead laceration Neck: + C-collar. Mood and affect are appropriate Heart: Regular rate and rhythm no rubs murmurs or extra sounds Lungs: Clear to auscultation, breathing unlabored, scattered wheezes.  + Leroy. Abdomen: Positive bowel sounds, soft nontender to palpation, nondistended Extremities: No clubbing, cyanosis Skin: large right brow lac cdi, cervical incision CDI with steristrips. Multiple bruises, including left shoulder Neuro: Alert and oriented  Moves all 4's with pain inhibition Musculoskeletal: Left upper extremity in sling  Assessment/Plan: 1. Functional deficits which require 3+ hours per day of interdisciplinary therapy in a comprehensive inpatient rehab setting. Physiatrist is providing close team supervision and 24 hour management of active medical problems listed below. Physiatrist and rehab team continue to assess barriers to discharge/monitor patient progress toward functional and medical goals  Care  Tool:  Bathing    Body parts bathed by patient: Chest, Abdomen, Right arm, Front perineal area, Right upper leg, Buttocks, Left upper leg, Right lower leg, Left lower leg, Face   Body parts bathed by helper: Buttocks, Right lower leg, Left lower leg, Right arm, Left arm     Bathing assist Assist Level: Maximal Assistance - Patient 24 - 49%     Upper Body Dressing/Undressing Upper body dressing   What is the patient wearing?: Pull over shirt    Upper body assist Assist Level: Minimal Assistance - Patient > 75%    Lower Body Dressing/Undressing Lower body dressing      What is the patient wearing?: Pants, Underwear/pull up     Lower body assist Assist for lower body dressing: Minimal Assistance - Patient > 75%     Toileting Toileting Toileting Activity did not occur (Clothing management and hygiene only): N/A (no void or bm)  Toileting assist Assist for toileting: Contact Guard/Touching assist     Transfers Chair/bed transfer  Transfers assist  Chair/bed transfer activity did not occur: Safety/medical concerns (limited by pain)  Chair/bed transfer assist level: Contact Guard/Touching assist     Locomotion Ambulation   Ambulation assist      Assist level: Contact Guard/Touching assist Assistive device: No Device Max distance: 12ft   Walk 10 feet activity   Assist     Assist  level: Contact Guard/Touching assist Assistive device: No Device   Walk 50 feet activity   Assist Walk 50 feet with 2 turns activity did not occur: Safety/medical concerns  Assist level: Contact Guard/Touching assist Assistive device: No Device    Walk 150 feet activity   Assist Walk 150 feet activity did not occur: Safety/medical concerns  Assist level: Contact Guard/Touching assist Assistive device: No Device    Walk 10 feet on uneven surface  activity   Assist     Assist level: Minimal Assistance - Patient > 75% Assistive device: Other (comment) (no AD)    Wheelchair     Assist Is the patient using a wheelchair?: No             Wheelchair 50 feet with 2 turns activity    Assist            Wheelchair 150 feet activity     Assist          Blood pressure 116/70, pulse 91, temperature 97.7 F (36.5 C), resp. rate 16, height 5\' 7"  (1.702 m), weight 58.9 kg, SpO2 97 %.  Medical Problem List and Plan: 1. Functional deficits secondary to polytrauma including cervical fx/subluxation s/p ACDF on 11/19/21.             Continue CIR 2.  Antithrombotics: -DVT/anticoagulation:  Pharmaceutical: Lovenox BID             -antiplatelet therapy:  N/A 3. Pain Management: decrease tylenol to 650 mg qid              ---LUE burning pain likely neuropathic cervical radic vs brachial plexopathy, start gabapentin 100mg  BID    Improved with meds on 2/26 4. Mood: LCSW to follow for evaluation and support.              -antipsychotic agents: N/A 5. Neuropsych: This patient may be intermittently capable of making decisions on her  own behalf. 6. Skin/Wound Care: cervical wound looks great 7. Fluids/Electrolytes/Nutrition: Monitor I/O.   -encouraging PO -protein supp for low albumin -LFT's normal  8. Cervical fractures C4-C7 s/p ACDF: Collar to stay on for support.  9. Left humeral head/neck fractures: May need Sarmiento brace for support as has not been compliant with sling -ortho to f/u  10. Pre-renal azotemia: Encourage fluid intake.  - BMP Latest Ref Rng & Units 11/29/2021 11/24/2021 11/23/2021  Glucose 70 - 99 mg/dL 112(H) 120(H) 129(H)  BUN 8 - 23 mg/dL 17 21 29(H)  Creatinine 0.44 - 1.00 mg/dL 0.52 0.58 0.70  BUN/Creat Ratio 6 - 22 (calc) - - -  Sodium 135 - 145 mmol/L 137 137 136  Potassium 3.5 - 5.1 mmol/L 3.7 3.7 3.5  Chloride 98 - 111 mmol/L 97(L) 104 103  CO2 22 - 32 mmol/L 32 26 26  Calcium 8.9 - 10.3 mg/dL 8.6(L) 8.7(L) 9.1  Resolved 2/21 12. Multiple rib fractures/Pulmonary contusion: Fluid overload: Leukocytosis:  WBC  13. Acute on chronic anxiety issues:  To avoid benzo's (was d/c) per PCP note.   --currently only on 0.25mg  xanax tid prn -added low dose seroquel to help with sleep 2/16--seems to have helped             -continue Lexapro 5mg  qhs started on admit  -neuropsych to see 14. Urinary retention:              Resolved 15. Fluid overload: Resolved off Diamox  16. Acute blood loss anemia:    Hemoglobin 9.0 on 2/21,  continue to monitor 17. Active smoker: discussed smoking history, she is determined not to smoke at home 18. COPD: changed oxygen order to maintain sats between 88 and 92%- prn ventolin 19. Low back pain: Added kpad.   Improving   LOS: 12 days A FACE TO FACE EVALUATION WAS PERFORMED  [name] [name] 12/05/2021, 8:21 AM

## 2021-12-06 LAB — BASIC METABOLIC PANEL
Anion gap: 7 (ref 5–15)
BUN: 14 mg/dL (ref 8–23)
CO2: 33 mmol/L — ABNORMAL HIGH (ref 22–32)
Calcium: 9 mg/dL (ref 8.9–10.3)
Chloride: 98 mmol/L (ref 98–111)
Creatinine, Ser: 0.59 mg/dL (ref 0.44–1.00)
GFR, Estimated: >60 mL/min (ref >60)
Glucose, Bld: 134 mg/dL — ABNORMAL HIGH (ref 70–99)
Potassium: 4.2 mmol/L (ref 3.5–5.1)
Sodium: 138 mmol/L (ref 135–145)

## 2021-12-06 LAB — CBC
HCT: 26.8 % — ABNORMAL LOW (ref 36.0–46.0)
Hemoglobin: 8.5 g/dL — ABNORMAL LOW (ref 12.0–15.0)
MCH: 31.3 pg (ref 26.0–34.0)
MCHC: 31.7 g/dL (ref 30.0–36.0)
MCV: 98.5 fL (ref 80.0–100.0)
Platelets: 336 10*3/uL (ref 150–400)
RBC: 2.72 MIL/uL — ABNORMAL LOW (ref 3.87–5.11)
RDW: 13.3 % (ref 11.5–15.5)
WBC: 9.7 10*3/uL (ref 4.0–10.5)
nRBC: 0 % (ref 0.0–0.2)

## 2021-12-06 NOTE — Progress Notes (Signed)
Occupational Therapy Note  Patient Details  Name: [name] MRN: [id number] Date of Birth: 1961/03/31  Today's Date: 12/06/2021 OT Individual Time: 0900-1000 OT Individual Time Calculation (min): 60 min    Short Term Goals: Week 2:  OT Short Term Goal 1 (Week 2): Pt will don UB clothing with adaptive technique with min A OT Short Term Goal 2 (Week 2): Pt's pain will be controlled to <6/10 during 2 consecutive OT sessions OT Short Term Goal 3 (Week 2): Pt will don LB clothing with CGA and good recall of spinal precutions. OT Short Term Goal 4 (Week 2): Patient will complete 2/3 grooming tasks in standing with CGA and good adherence to spinal precautions.  Skilled Therapeutic Interventions/Progress Updates:    Pt received supine asking to use the bathroom. She completed a stand pivot to the [facility] with (S). Pt did get her leg pinched between the BSC lid and seat and had a small bruise. (+) urine void- urine cloudy and malodorous. Pt completed toileting tasks with (S) overall. Pt on 1.5 L O2 via [place], her SPO2 fluctuates frequently, as low as 80-95%. Family education session completed with pt and her daughter [name]. Verbal education provided re fall risk reduction, energy conservation strategies, home carryover of transfer training, ADLs, and IADLs. Demonstration and hands on training completed for pt performance of UB/LB bathing and dressing at (S) level, toileting hygiene and transfers, and shower transfers. She was taken via w/c to the ADL apt where we reviewed use of TTB vs shower chair, decided with problem solving that she will use walk in shower with shower chair. Pt returned to her room and was left sitting up, awaiting PT.    Therapy Documentation Precautions:  Precautions Precautions: Fall, Back, Cervical Required Braces or Orthoses: Sling, Cervical Brace Cervical Brace: Hard collar, At all times Restrictions Weight Bearing Restrictions: Yes LUE Weight Bearing: Non weight  bearing Other Position/Activity Restrictions: immobilizer sling        Therapy/Group: Individual Therapy  AEMILIA DEDRICK 12/06/2021, 6:22 AM

## 2021-12-06 NOTE — Progress Notes (Signed)
Patient ID: [name], female   DOB: 08-Jan-1961, 61 y.o.   MRN: [id number]  SW met with pt in room to review discharge recommendations: 3in1 BSC and outpatient therapies. Pt prefers a location closer towards home. SW explained will send to [facility] [place], and this location may offer other locations.   SW spoke with pt dtr [name] to discuss above, and ask her to come in tomorrow during her therapies to make sure nursing can go over care needs.   SW spoke with J[employer] to discuss 3in1 BSC and possible oxygen. SW will confirm in the morning if o2 is needed.   Loralee Pacas, MSW, [place] Office: [phone] Cell: [phone] Fax: [phone]

## 2021-12-06 NOTE — Progress Notes (Signed)
Physical Therapy Session Note  Patient Details  Name: [name] MRN: [id number] Date of Birth: June 07, 1961  Short Term Goals: Week 2:  PT Short Term Goal 1 (Week 2): STGs = LTGs  Skilled Therapeutic Interventions/Progress Updates:     Pt received seated in [facility] and agrees to therapy. No complaint of pain. Pt's daughter present for family education. PT provides recommendations for mobility at home and frequency of ambulation. WC transport to gym for time management. PT demonstrates car transfer. Pt then ambulates x25' and completes car transfer with CGA. Seated rest break prior to additional mobility. Pt taken into stairwell. Pt completes x5 7 inch steps with R hand rail and CGA, with cues for positioning and sequencing. PT educates on daughter on safe guarding of pt. Following seated rest break, pt completes additional x5 steps with R hand rail and daughter providing CGA. Daughter verbalizes that she has no further concerns.   Pt attends "dance group" to work on coordination and endurance. Pt performs choreographed movements while seated in WC, including LAQs, toe taps, arm raises, and trunk leans. PT also provides minA so that pt can perform these movements in standing, with pt spending extended amount of time in alternating single leg stance. Pt takes several seated rest breaks during activity.  Pt left seated in WC with alarm intact and all needs within reach    Therapy Documentation Precautions:  Precautions Precautions: Fall, Back, Cervical Required Braces or Orthoses: Sling, Cervical Brace Cervical Brace: Hard collar, At all times Restrictions Weight Bearing Restrictions: Yes LUE Weight Bearing: Non weight bearing Other Position/Activity Restrictions: immobilizer sling    Therapy/Group: Individual Therapy  Breck Coons, PT, DPT 12/06/2021, 12:55 PM

## 2021-12-06 NOTE — Progress Notes (Signed)
PROGRESS NOTE   Subjective/Complaints: Still c/o soreness, especially along left chest. Occasionally becomes SOB d/t anxiety. Asked how long she'll need collar  ROS: Patient denies fever, rash, sore throat, blurred vision, dizziness, nausea, vomiting, diarrhea, cough, shortness of breath or chest pain,   headache, or mood change.   Objective:   No results found. Recent Labs    12/06/21 0811  WBC 9.7  HGB 8.5*  HCT 26.8*  PLT 336   Recent Labs    12/06/21 0811  NA 138  K 4.2  CL 98  CO2 33*  GLUCOSE 134*  BUN 14  CREATININE 0.59  CALCIUM 9.0    Intake/Output Summary (Last 24 hours) at 12/06/2021 1023 Last data filed at 12/06/2021 0700 Gross per 24 hour  Intake 476 ml  Output --  Net 476 ml        Physical Exam: Vital Signs Blood pressure 127/68, pulse 92, temperature 98.1 F (36.7 C), temperature source Oral, resp. rate 18, height 5\' 7"  (1.702 m), weight 58.9 kg, SpO2 95 %.  Constitutional: No distress . Vital signs reviewed. HEENT: NCAT, EOMI, oral membranes moist Neck: supple Cardiovascular: RRR without murmur. No JVD    Respiratory/Chest: CTA Bilaterally without wheezes or rales. Normal effort    GI/Abdomen: BS +, non-tender, non-distended Ext: no clubbing, cyanosis, or edema Psych: pleasant and cooperative  Skin: scalp lacs all healing nicely. Bruising improved left shoulder Neuro: Alert and oriented  Moves all 4's with pain inhibition Musculoskeletal: Left upper extremity in sling  Assessment/Plan: 1. Functional deficits which require 3+ hours per day of interdisciplinary therapy in a comprehensive inpatient rehab setting. Physiatrist is providing close team supervision and 24 hour management of active medical problems listed below. Physiatrist and rehab team continue to assess barriers to discharge/monitor patient progress toward functional and medical goals  Care Tool:  Bathing    Body  parts bathed by patient: Chest, Abdomen, Right arm, Front perineal area, Right upper leg, Buttocks, Left upper leg, Right lower leg, Left lower leg, Face   Body parts bathed by helper: Buttocks, Right lower leg, Left lower leg, Right arm, Left arm     Bathing assist Assist Level: Maximal Assistance - Patient 24 - 49%     Upper Body Dressing/Undressing Upper body dressing   What is the patient wearing?: Pull over shirt    Upper body assist Assist Level: Minimal Assistance - Patient > 75%    Lower Body Dressing/Undressing Lower body dressing      What is the patient wearing?: Pants, Underwear/pull up     Lower body assist Assist for lower body dressing: Minimal Assistance - Patient > 75%     Toileting Toileting Toileting Activity did not occur (Clothing management and hygiene only): N/A (no void or bm)  Toileting assist Assist for toileting: Contact Guard/Touching assist     Transfers Chair/bed transfer  Transfers assist  Chair/bed transfer activity did not occur: Safety/medical concerns (limited by pain)  Chair/bed transfer assist level: Contact Guard/Touching assist     Locomotion Ambulation   Ambulation assist      Assist level: Contact Guard/Touching assist Assistive device: No Device Max distance: 162ft   Walk  10 feet activity   Assist     Assist level: Contact Guard/Touching assist Assistive device: No Device   Walk 50 feet activity   Assist Walk 50 feet with 2 turns activity did not occur: Safety/medical concerns  Assist level: Contact Guard/Touching assist Assistive device: No Device    Walk 150 feet activity   Assist Walk 150 feet activity did not occur: Safety/medical concerns  Assist level: Contact Guard/Touching assist Assistive device: No Device    Walk 10 feet on uneven surface  activity   Assist     Assist level: Minimal Assistance - Patient > 75% Assistive device: Other (comment) (no AD)   Wheelchair     Assist  Is the patient using a wheelchair?: No             Wheelchair 50 feet with 2 turns activity    Assist            Wheelchair 150 feet activity     Assist          Blood pressure 127/68, pulse 92, temperature 98.1 F (36.7 C), temperature source Oral, resp. rate 18, height 5\' 7"  (1.702 m), weight 58.9 kg, SpO2 95 %.  Medical Problem List and Plan: 1. Functional deficits secondary to polytrauma including cervical fx/subluxation s/p ACDF on 11/19/21.            -Continue CIR therapies including PT, OT  2.  Antithrombotics: -DVT/anticoagulation:  Pharmaceutical: Lovenox BID             -antiplatelet therapy:  N/A 3. Pain Management: decrease tylenol to 650 mg qid              ---LUE burning pain likely neuropathic cervical radic vs brachial plexopathy, started gabapentin 100mg  BID    Improved with meds on 2/26   -appears more along chest wall 2/27--continue with above   -kpad for back pain 4. Mood: LCSW to follow for evaluation and support.              -antipsychotic agents: N/A 5. Neuropsych: This patient may be intermittently capable of making decisions on her  own behalf. 6. Skin/Wound Care: cervical wound looks great 7. Fluids/Electrolytes/Nutrition: Monitor I/O.   -encouraging PO -protein supp for low albumin -LFT's normal  8. Cervical fractures C4-C7 s/p ACDF: Collar to stay on for support.' -she'll follow up with NS in office for further recs 9. Left humeral head/neck fractures: May need Sarmiento brace for support as has not been compliant with sling -ortho to f/u  10. Pre-renal azotemia: Encourage fluid intake.  - BMP Latest Ref Rng & Units 12/06/2021 11/29/2021 11/24/2021  Glucose 70 - 99 mg/dL 134(H) 112(H) 120(H)  BUN 8 - 23 mg/dL 14 17 21   Creatinine 0.44 - 1.00 mg/dL 0.59 0.52 0.58  BUN/Creat Ratio 6 - 22 (calc) - - -  Sodium 135 - 145 mmol/L 138 137 137  Potassium 3.5 - 5.1 mmol/L 4.2 3.7 3.7  Chloride 98 - 111 mmol/L 98 97(L) 104  CO2 22 - 32  mmol/L 33(H) 32 26  Calcium 8.9 - 10.3 mg/dL 9.0 8.6(L) 8.7(L)  Resolved 2/27 12. Multiple rib fractures/Pulmonary contusion: Fluid overload: Leukocytosis: WBC  13. Acute on chronic anxiety issues:  To avoid benzo's (was d/c) per PCP note.   --currently only on 0.25mg  xanax tid prn -added low dose seroquel to help with sleep 2/16              -continue Lexapro 5mg  qhs started on  admit  -appreciate Dr. Ferne Coe assistance 14. Urinary retention:              Resolved 15. Fluid overload: Resolved off Diamox  16. Acute blood loss anemia:    Hemoglobin 9.0 on 2/21--holding at 8.5 2/27 17. Active smoker: discussed smoking history, she is determined not to smoke at home 18. COPD: changed oxygen order to maintain sats between 88 and 92%- prn ventolin    LOS: 13 days A FACE TO FACE EVALUATION WAS PERFORMED  Meredith Staggers 12/06/2021, 10:23 AM

## 2021-12-06 NOTE — Progress Notes (Signed)
Physical Therapy Session Note  Patient Details  Name: [name] MRN: [id number] Date of Birth: 28-Feb-1961  Today's Date: 12/06/2021 PT Individual Time: 1300-1355 PT Individual Time Calculation (min): 55 min   Short Term Goals: Week 2:  PT Short Term Goal 1 (Week 2): STGs = LTGs  Skilled Therapeutic Interventions/Progress Updates:    Pt received sidelying in bed, agreeable to PT session. Pt reports pain in her upper back (between shoulder blades) and in her broken ribs. Pt does not rate pain and able to receive pain medication during session. Utilized repositioning and rest breaks as needed for pain management. Bed mobility Supervision. Pt reports urge to toilet. Sit to stand and stand pivot to [facility] with no AD and CGA. Pt is able to continently void while seated on BSC. Pt requires some assist for clothing management otherwise Supervision for toileting. Stand pivot to w/c with CGA. Pt taken outdoors for improved pt mood and therapy buy-in, however pt states it is too cold outside once taken outdoors (60 degrees). Pt taken back inside for improved comfort. Ambulation 3 x 150 ft with no AD and CGA for balance with focus on improving endurance. Pt reports she fatigues very quickly with gait. Education regarding endurance and how to improve especially upon d/c home. Pt with questions regarding follow up therapy, deferred to primary therapy team to determine appropriate level of follow up therapies. Pt requests to return to bed at end of session due to fatigue. Bed mobility Supervision. Pt left sidelying in bed with needs in reach, bed alarm in place at end of session. Pt initially on 1L O2 via [place] at rest, SpO2 99%. SpO2 decreases to 83% during gait while on 1L O2, increased to 2L O2. With increase in supplemental O2 and pursed lip breathing techniques SpO2 returns to 95% (+). Attempted to decrease pt back to 1L O2 at end of session, however SpO2 drops to 86% so O2 left at 2L O2 at end of session, SpO2 in  90s.  Therapy Documentation Precautions:  Precautions Precautions: Fall, Back, Cervical Required Braces or Orthoses: Sling, Cervical Brace Cervical Brace: Hard collar, At all times Restrictions Weight Bearing Restrictions: Yes LUE Weight Bearing: Non weight bearing Other Position/Activity Restrictions: immobilizer sling      Therapy/Group: Individual Therapy   Excell Seltzer, PT, DPT, CSRS  12/06/2021, 3:21 PM

## 2021-12-07 ENCOUNTER — Other Ambulatory Visit (HOSPITAL_COMMUNITY): Payer: Self-pay

## 2021-12-07 ENCOUNTER — Inpatient Hospital Stay (HOSPITAL_COMMUNITY): Payer: 59

## 2021-12-07 MED ORDER — NICOTINE 14 MG/24HR TD PT24
14.0000 mg | MEDICATED_PATCH | Freq: Every day | TRANSDERMAL | 0 refills | Status: DC
  Filled 2021-12-07: qty 28, 28d supply, fill #0

## 2021-12-07 MED ORDER — OXYCODONE HCL 5 MG PO TABS
5.0000 mg | ORAL_TABLET | Freq: Four times a day (QID) | ORAL | Status: DC | PRN
  Administered 2021-12-07 – 2021-12-08 (×3): 10 mg via ORAL
  Filled 2021-12-07 (×3): qty 2

## 2021-12-07 MED ORDER — GABAPENTIN 100 MG PO CAPS
100.0000 mg | ORAL_CAPSULE | Freq: Three times a day (TID) | ORAL | 0 refills | Status: DC
  Filled 2021-12-07: qty 90, 30d supply, fill #0

## 2021-12-07 MED ORDER — ALBUTEROL SULFATE HFA 108 (90 BASE) MCG/ACT IN AERS
INHALATION_SPRAY | RESPIRATORY_TRACT | 1 refills | Status: DC | PRN
  Filled 2021-12-07: qty 8.5, 14d supply, fill #0

## 2021-12-07 MED ORDER — ALPRAZOLAM 0.25 MG PO TABS
0.2500 mg | ORAL_TABLET | Freq: Two times a day (BID) | ORAL | 0 refills | Status: DC | PRN
  Filled 2021-12-07: qty 30, 15d supply, fill #0

## 2021-12-07 MED ORDER — TRAZODONE HCL 50 MG PO TABS
25.0000 mg | ORAL_TABLET | Freq: Every evening | ORAL | 0 refills | Status: DC | PRN
  Filled 2021-12-07: qty 10, 10d supply, fill #0

## 2021-12-07 MED ORDER — ESCITALOPRAM OXALATE 5 MG PO TABS
5.0000 mg | ORAL_TABLET | Freq: Every day | ORAL | 0 refills | Status: DC
  Filled 2021-12-07: qty 30, 30d supply, fill #0

## 2021-12-07 MED ORDER — QUETIAPINE FUMARATE 25 MG PO TABS
25.0000 mg | ORAL_TABLET | Freq: Every day | ORAL | 0 refills | Status: DC
  Filled 2021-12-07: qty 30, 30d supply, fill #0

## 2021-12-07 MED ORDER — OXYCODONE HCL 10 MG PO TABS
5.0000 mg | ORAL_TABLET | Freq: Four times a day (QID) | ORAL | 0 refills | Status: DC | PRN
  Filled 2021-12-07: qty 28, 7d supply, fill #0

## 2021-12-07 MED ORDER — TAMSULOSIN HCL 0.4 MG PO CAPS
0.4000 mg | ORAL_CAPSULE | Freq: Every day | ORAL | 0 refills | Status: DC
  Filled 2021-12-07: qty 30, 30d supply, fill #0

## 2021-12-07 MED ORDER — POLYETHYLENE GLYCOL 3350 17 GM/SCOOP PO POWD
17.0000 g | Freq: Two times a day (BID) | ORAL | 0 refills | Status: DC
Start: 2021-12-07 — End: 2022-02-23
  Filled 2021-12-07: qty 476, 14d supply, fill #0

## 2021-12-07 MED ORDER — ACETAMINOPHEN 325 MG PO TABS
650.0000 mg | ORAL_TABLET | Freq: Three times a day (TID) | ORAL | 0 refills | Status: DC
  Filled 2021-12-07: qty 60, 8d supply, fill #0

## 2021-12-07 MED ORDER — GABAPENTIN 100 MG PO CAPS
100.0000 mg | ORAL_CAPSULE | Freq: Three times a day (TID) | ORAL | Status: DC
Start: 2021-12-07 — End: 2021-12-08
  Administered 2021-12-07 – 2021-12-08 (×3): 100 mg via ORAL
  Filled 2021-12-07 (×3): qty 1

## 2021-12-07 MED ORDER — METHOCARBAMOL 500 MG PO TABS
500.0000 mg | ORAL_TABLET | Freq: Four times a day (QID) | ORAL | 0 refills | Status: DC | PRN
  Filled 2021-12-07: qty 60, 15d supply, fill #0

## 2021-12-07 MED ORDER — SENNOSIDES-DOCUSATE SODIUM 8.6-50 MG PO TABS
2.0000 | ORAL_TABLET | Freq: Every day | ORAL | 0 refills | Status: DC
  Filled 2021-12-07: qty 60, 30d supply, fill #0

## 2021-12-07 MED ORDER — GUAIFENESIN 100 MG/5ML PO LIQD
15.0000 mL | Freq: Four times a day (QID) | ORAL | 0 refills | Status: DC
  Filled 2021-12-07: qty 473, 8d supply, fill #0

## 2021-12-07 NOTE — Progress Notes (Signed)
Occupational Therapy Session Note  Patient Details  Name: [name] MRN: [id number] Date of Birth: Mar 01, 1961  Today's Date: 12/07/2021 OT Individual Time: 1337-1430 OT Individual Time Calculation (min): 53 min    Short Term Goals: Week 2:  OT Short Term Goal 1 (Week 2): Pt will don UB clothing with adaptive technique with min A OT Short Term Goal 2 (Week 2): Pt's pain will be controlled to <6/10 during 2 consecutive OT sessions OT Short Term Goal 3 (Week 2): Pt will don LB clothing with CGA and good recall of spinal precutions. OT Short Term Goal 4 (Week 2): Patient will complete 2/3 grooming tasks in standing with CGA and good adherence to spinal precautions.  Skilled Therapeutic Interventions/Progress Updates:  Pt greeted supine in bed  agreeable to OT intervention. Session focus on BADL reeducation, functional mobility, dynamic standing balance and decreasing overall caregiver burden. Pt completed supine>sit with supervision using log roll technique, supervision for stand pivot transfers throughout session with no AD. Total A to don sling and total A to readjust cervical collar. Pt transported outside to practice functional mobility across uneven surfaces to challenge dynamic balance, and increase overall mood and affect. Pt completed functional mobility outside with overall supervision practicing sit>stands from low surfaces with and without arm rests to increase LB strength. Pt on 2L [place] during session with SpO2 88-93% during session. Education provided on energy conservation stratgies for home and importance of knowing s/s of personal fatigue. Pt transported back to room in w/c with total A where pt completed ambulatory toilet transfer with supervision, +BM. Supervision for 3/3 toileting tasks. Education provided on energy conservation, cervical precautions, and back precautions during session with pt verbalizing understanding.  pt left supine in bed with bed alarm activated and all needs  within reach.                     Therapy Documentation Precautions:  Precautions Precautions: Fall, Back, Cervical Required Braces or Orthoses: Sling, Cervical Brace Cervical Brace: Hard collar, At all times Restrictions Weight Bearing Restrictions: Yes LUE Weight Bearing: Non weight bearing Other Position/Activity Restrictions: immobilizer sling  Therapy Vitals Temp: 99 F (37.2 C) Pulse Rate: 85 Resp: 18 BP: (!) 98/57 Patient Position (if appropriate): Lying Oxygen Therapy SpO2: 97 % O2 Device: Nasal Cannula Pain: no pain reported during session     Therapy/Group: Individual Therapy  Precious Haws 12/07/2021, 3:42 PM

## 2021-12-07 NOTE — Progress Notes (Signed)
Inpatient Rehabilitation Discharge Medication Review by a Pharmacist  A complete drug regimen review was completed for this patient to identify any potential clinically significant medication issues.  High Risk Drug Classes Is patient taking? Indication by Medication  Antipsychotic Yes Seroquel- sleep  Anticoagulant No   Antibiotic Yes Mupirocin ointment for Staph aureus colonization  Opioid Yes Oxycodone PRN pain  Antiplatelet No   Hypoglycemics/insulin No   Vasoactive Medication Yes Flomax- urinary hesitancy   Chemotherapy No   Other Yes Lexapro, xanax- MDD/anxiety Gabapentin- neuropathic pain Trazodone- sleep     Type of Medication Issue Identified Description of Issue Recommendation(s)  Drug Interaction(s) (clinically significant)     Duplicate Therapy     Allergy     No Medication Administration End Date     Incorrect Dose     Additional Drug Therapy Needed     Significant med changes from prior encounter (inform family/care partners about these prior to discharge).    Other       Clinically significant medication issues were identified that warrant physician communication and completion of prescribed/recommended actions by midnight of the next day:  No   Time spent performing this drug regimen review (minutes):  30  [name] BS, PharmD, BCPS Clinical Pharmacist 12/08/2021 12:35 PM

## 2021-12-07 NOTE — Patient Care Conference (Signed)
Inpatient RehabilitationTeam Conference and Plan of Care Update Date: 12/07/2021   Time: 10:30 AM    Patient Name: [name]      Medical Record Number: [id number]  Date of Birth: 09-01-1961 Sex: Female         Room/Bed: 4W13C/4W13C-01 Payor Info: Payor: [place] / Plan: Normal / Product Type: *No Product type* /    Admit Date/Time:  11/23/2021  6:33 PM  Primary Diagnosis:  Trauma  Hospital Problems: Principal Problem:   Trauma Active Problems:   Panic disorder   Acute blood loss anemia   Generalized anxiety disorder   Neuropathic pain    Expected Discharge Date: Expected Discharge Date: 12/08/21  Team Members Present: Physician leading conference: Dr. Alger Simons Social Worker Present: Loralee Pacas, [place] Nurse Present: Dorthula Nettles, RN PT Present: Tereasa Coop, PT OT Present: Willeen Cass, OT PPS Coordinator present : Gunnar Fusi, SLP     Current Status/Progress Goal Weekly Team Focus  Bowel/Bladder   continent to bowel and bladder LBm 12/06/21  remain continent  toilet as needed   Swallow/Nutrition/ Hydration             ADL's   at goal level  Supervision  pt's fam education, self care retraining d/c planning   Mobility   supervision all mobility, ambulating up to 300', 12 steps with R hand rail  supervision  DC prep   Communication             Safety/Cognition/ Behavioral Observations            Pain   pain 8/10 requiring pain meds Q4 hrs,  < 3  assess pain q 4hr and prn   Skin   sterin strips to anterior neck, laceration to right eye sutures removed edges apoximated  wounds to show signs of healing  assess skin q shift and prn     Discharge Planning:  Pt to d/c to home with her dtr [name] who will provide 24/7 care. Family edu completed on Monday (2/27) 9am-12pm with dtr and granddaughter.   Team Discussion: Medically stable for discharge. May have limited ROM in upper extremity. Collar remains on until Ortho  follow-up. Reports of cloudy urine, push fluids. Reports pain, and anxiety, managed with medication. Cervical incision CDI. Use of gait belt hurts fractured ribs. Discharging home with daughter to assist. Will need a walk test for Oxygen use at home.   Patient on target to meet rehab goals: yes, supervision goals. Met all therapy goals. Transfers great.   *See Care Plan and progress notes for long and short-term goals.   Revisions to Treatment Plan:  Finalizing discharge plans and medications.   Teaching Needs: Family education completed.   Current Barriers to Discharge: New oxygen and pain and anxiety.  Possible Resolutions to Barriers: Family education Follow-up PT/OT Follow-up Ortho appointment     Medical Summary Current Status: ongoing pain issues, left arm with ?radiculopathy. anxiety an issues sitll but working through it. remains in cervical collar  Barriers to Discharge: Medical stability;New oxygen   Possible Resolutions to [employer] Focus: setting up oxygen at home. pain mgt. anxiety control. finalizing med plans for discharge.   Continued Need for Acute Rehabilitation Level of Care: The patient requires daily medical management by a physician with specialized training in physical medicine and rehabilitation for the following reasons: Direction of a multidisciplinary physical rehabilitation program to maximize functional independence : Yes Medical management of patient stability for increased activity during participation  in an intensive rehabilitation regime.: Yes Analysis of laboratory values and/or radiology reports with any subsequent need for medication adjustment and/or medical intervention. : Yes   I attest that I was present, lead the team conference, and concur with the assessment and plan of the team.   Cristi Loron 12/07/2021, 2:48 PM

## 2021-12-07 NOTE — Progress Notes (Signed)
Patient ID: [name], female   DOB: 1961/03/05, 61 y.o.   MRN: [id number]  SW faxed outpatient PT/OT referral to [facility] at [facility] (p:[phone]/[phone]).  SW sent order for DME: 3in1 BSC/home 02 and clinicals to [employer]. Pt will receive oxygen tank prior to discharge tomorrow.   *SW left message for pt dtr Candice discussing above, and requested return phone call if needed.   SW met with pt in room to discuss above, and confirm d/c remains tomorrow.   Loralee Pacas, MSW, [place] Office: [phone] Cell: [phone] Fax: [phone]

## 2021-12-07 NOTE — Progress Notes (Signed)
Occupational Therapy Session Note  Patient Details  Name: [name] MRN: [id number] Date of Birth: 02/19/1961  Today's Date: 12/07/2021 OT Individual Time: 1130-1200 OT Individual Time Calculation (min): 30 min    Short Term Goals: Week 1:  OT Short Term Goal 1 (Week 1): Pt will don UB clothing with adaptive technique with min A OT Short Term Goal 1 - Progress (Week 1): Progressing toward goal OT Short Term Goal 2 (Week 1): Pt will complete toileting tasks with mod A OT Short Term Goal 2 - Progress (Week 1): Met OT Short Term Goal 3 (Week 1): Pt will complete toilet transfer with min A OT Short Term Goal 3 - Progress (Week 1): Met OT Short Term Goal 4 (Week 1): Pt's pain will be controlled to <6/10 during 2 consecutive OT sessions OT Short Term Goal 4 - Progress (Week 1): Progressing toward goal Week 2:  OT Short Term Goal 1 (Week 2): Pt will don UB clothing with adaptive technique with min A OT Short Term Goal 2 (Week 2): Pt's pain will be controlled to <6/10 during 2 consecutive OT sessions OT Short Term Goal 3 (Week 2): Pt will don LB clothing with CGA and good recall of spinal precutions. OT Short Term Goal 4 (Week 2): Patient will complete 2/3 grooming tasks in standing with CGA and good adherence to spinal precautions. Week 3:     Skilled Therapeutic Interventions/Progress Updates:   Assessment for O2 needs   Patient saturations on room air at rest = 87% Patient saturations on room air while ambulating = 81% for 1.5 min - from her room the nurse's station Patient saturations on 2 Liters of oxygen while ambulating = 97%  Briefly explain why patient needs home oxygen: Pt requires O2 to maintain above 88 % with all functional tasks including functional mobility household distances and for basic ADL tasks.   Pt toileted mod I in session and performed functional ambulation with supervision with O2 (after assessment)   Therapy Documentation Precautions:   Precautions Precautions: Fall, Back, Cervical Required Braces or Orthoses: Sling, Cervical Brace Cervical Brace: Hard collar, At all times Restrictions Weight Bearing Restrictions: Yes LUE Weight Bearing: Non weight bearing Other Position/Activity Restrictions: immobilizer sling General:   Vital Signs:   Pain: Pain Assessment Pain Scale: 0-10 Pain Score: 0-No pain    Therapy/Group: Individual Therapy  Willeen Cass [facility] 12/07/2021, 11:06 AM

## 2021-12-07 NOTE — Progress Notes (Signed)
Occupational Therapy Discharge Summary  Patient Details  Name: [name] MRN: [id number] Date of Birth: [birth year]  Today's Date: 12/07/2021 OT Individual Time: 2542-7062 OT Individual Time Calculation (min): 75 min    Pt received in w/c with unrated shoulder and back pain. Shower and muscle relaxer provided  ADL: Pt completes ADL at overall Supervision Level. Skilled interventions include: VC for sequnce for doffing and donning clothing itmes and increased time, A to wash, dry and style hair d/t 1UE only functional, VC for adaptive strategies to don footwear EOB. Pt able to doff all clothing seated on toilet prior to shower with tactile cuing to pull shirt overhead first. Pt with mild anxiety in shower, but improved with breathing techniques. Pt dresses EOB and grooms seated at sink with set up.  Pt left at end of session in w/c with exit alarm on, call light in reach and all needs met   Patient has met 7 of 7 long term goals due to improved activity tolerance, improved balance, postural control, ability to compensate for deficits, and improved attention.  Patient to discharge at overall Supervision level.  Patient's care partner is independent to provide the necessary physical assistance at discharge.  Pt family completed family education on ADL and functional mobility performance. Pt has had improved pain tolerance, balance and endurance which has greatly improved since admission and is now funcitoning at an overall supervision level   Reasons goals not met: n/a  Recommendation:  Patient will benefit from ongoing skilled OT services in home health setting to continue to advance functional skills in the area of BADL, iADL, and Reduce care partner burden.  Equipment: BSC  Reasons for discharge: treatment goals met and discharge from hospital  Patient/family agrees with progress made and goals achieved: Yes  OT Discharge Precautions/Restrictions    General   Vital  Signs Therapy Vitals Temp: 99 F (37.2 C) Pulse Rate: 85 Resp: 18 BP: (!) 98/57 Patient Position (if appropriate): Lying Oxygen Therapy SpO2: 97 % O2 Device: Nasal Cannula Pain   ADL Supervision: cuing for adaptive strategies Vision Baseline Vision/History: 0 No visual deficits Patient Visual Report: No change from baseline Vision Assessment?: No apparent visual deficits Perception  Perception: Within Functional Limits Praxis Praxis: Intact Cognition Overall Cognitive Status: Impaired/Different from baseline Arousal/Alertness: Awake/alert Orientation Level: Oriented X4 Year: 2023 Month: February Day of Week: Correct Memory: Impaired Memory Impairment: Decreased recall of new information Immediate Memory Recall: Sock;Blue;Bed Memory Recall Sock: Without Cue Memory Recall Blue: Without Cue Memory Recall Bed: Without Cue Safety/Judgment: Appears intact Sensation Sensation Light Touch: Appears Intact Hot/Cold: Appears Intact Coordination Gross Motor Movements are Fluid and Coordinated: No Fine Motor Movements are Fluid and Coordinated: Yes Coordination and Movement Description: Limited by severe L shoulder pain and weakness Finger Nose Finger Test: WFL on the R, unable to test on the L Motor  Motor Motor: Within Functional Limits Mobility  Transfers Sit to Stand: Supervision/Verbal cueing Stand to Sit: Supervision/Verbal cueing  Trunk/Postural Assessment  Cervical Assessment Cervical Assessment:  (c-COLLAR) Thoracic Assessment Thoracic Assessment: Exceptions to [facility] (KYPHOTIC) Lumbar Assessment Lumbar Assessment: Exceptions to WFL (POST PELVIC TILT) Postural Control Postural Control: Deficits on evaluation (IMPROVED SINCE EVAL)  Balance Static Sitting Balance Static Sitting - Level of Assistance: 6: Modified independent (Device/Increase time) Dynamic Sitting Balance Dynamic Sitting - Level of Assistance: 6: Modified independent (Device/Increase  time) Static Standing Balance Static Standing - Level of Assistance: 5: Stand by assistance Extremity/Trunk Assessment RUE Assessment RUE Assessment: Within Functional  Limits  LUE Assessment LUE Assessment: Exceptions to [facility] Active Range of Motion (AROM) Comments: No formal test, Immobilized in sling per orders. No order on ROM   [name] 12/07/2021, 2:43 PM

## 2021-12-07 NOTE — Progress Notes (Signed)
PROGRESS NOTE   Subjective/Complaints: Pt told me today that soreness really is along arm and not chest wall (as she reported before). Sometimes it just feels like it's in her chest. Describes it as a burning, severe pain. A little anxious about dc tomorrow  ROS: Patient denies fever, rash, sore throat, blurred vision, dizziness, nausea, vomiting, diarrhea, cough, shortness of breath or chest pain,  headache, or mood change.   Objective:   No results found. Recent Labs    12/06/21 0811  WBC 9.7  HGB 8.5*  HCT 26.8*  PLT 336   Recent Labs    12/06/21 0811  NA 138  K 4.2  CL 98  CO2 33*  GLUCOSE 134*  BUN 14  CREATININE 0.59  CALCIUM 9.0    Intake/Output Summary (Last 24 hours) at 12/07/2021 0915 Last data filed at 12/07/2021 0700 Gross per 24 hour  Intake 358 ml  Output --  Net 358 ml        Physical Exam: Vital Signs Blood pressure 129/73, pulse 96, temperature 98.2 F (36.8 C), temperature source Oral, resp. rate 18, height 5\' 7"  (1.702 m), weight 58.9 kg, SpO2 95 %.  Constitutional: No distress . Vital signs reviewed. HEENT: NCAT, EOMI, oral membranes moist Neck: supple Cardiovascular: RRR without murmur. No JVD    Respiratory/Chest: CTA Bilaterally without wheezes or rales. Normal effort    GI/Abdomen: BS +, non-tender, non-distended Ext: no clubbing, cyanosis, or edema Psych: pleasant and cooperative  Skin: scalp lacs all healing nicely. Bruising improved left shoulder Neuro: Alert and oriented  Moves all 4's with pain inhibition Musculoskeletal: Left upper extremity in sling, sensitive to PROM  Assessment/Plan: 1. Functional deficits which require 3+ hours per day of interdisciplinary therapy in a comprehensive inpatient rehab setting. Physiatrist is providing close team supervision and 24 hour management of active medical problems listed below. Physiatrist and rehab team continue to assess  barriers to discharge/monitor patient progress toward functional and medical goals  Care Tool:  Bathing    Body parts bathed by patient: Chest, Abdomen, Right arm, Front perineal area, Right upper leg, Buttocks, Left upper leg, Right lower leg, Left lower leg, Face   Body parts bathed by helper: Buttocks, Right lower leg, Left lower leg, Right arm, Left arm     Bathing assist Assist Level: Maximal Assistance - Patient 24 - 49%     Upper Body Dressing/Undressing Upper body dressing   What is the patient wearing?: Pull over shirt    Upper body assist Assist Level: Minimal Assistance - Patient > 75%    Lower Body Dressing/Undressing Lower body dressing      What is the patient wearing?: Pants, Underwear/pull up     Lower body assist Assist for lower body dressing: Minimal Assistance - Patient > 75%     Toileting Toileting Toileting Activity did not occur (Clothing management and hygiene only): N/A (no void or bm)  Toileting assist Assist for toileting: Contact Guard/Touching assist     Transfers Chair/bed transfer  Transfers assist  Chair/bed transfer activity did not occur: Safety/medical concerns (limited by pain)  Chair/bed transfer assist level: Contact Guard/Touching assist     Locomotion Ambulation  Ambulation assist      Assist level: Contact Guard/Touching assist Assistive device: No Device Max distance: 152ft   Walk 10 feet activity   Assist     Assist level: Contact Guard/Touching assist Assistive device: No Device   Walk 50 feet activity   Assist Walk 50 feet with 2 turns activity did not occur: Safety/medical concerns  Assist level: Contact Guard/Touching assist Assistive device: No Device    Walk 150 feet activity   Assist Walk 150 feet activity did not occur: Safety/medical concerns  Assist level: Contact Guard/Touching assist Assistive device: No Device    Walk 10 feet on uneven surface  activity   Assist      Assist level: Minimal Assistance - Patient > 75% Assistive device: Other (comment) (no AD)   Wheelchair     Assist Is the patient using a wheelchair?: No             Wheelchair 50 feet with 2 turns activity    Assist            Wheelchair 150 feet activity     Assist          Blood pressure 129/73, pulse 96, temperature 98.2 F (36.8 C), temperature source Oral, resp. rate 18, height 5\' 7"  (1.702 m), weight 58.9 kg, SpO2 95 %.  Medical Problem List and Plan: 1. Functional deficits secondary to polytrauma including cervical fx/subluxation s/p ACDF on 11/19/21.            -Continue CIR therapies including PT and OT. Interdisciplinary team conference today to discuss goals, barriers to discharge, and dc planning.   -ELOS 12/08/21, f/u with [facility], surgery, PCP 2.  Antithrombotics: -DVT/anticoagulation:  Pharmaceutical: Lovenox BID             -antiplatelet therapy:  N/A 3. Pain Management: decrease tylenol to 650 mg qid              ---LUE burning pain likely neuropathic cervical radic vs brachial plexopathy, started gabapentin 100mg  BID    Improved with meds on 2/26   -2/28 persistent pain--increase gabapentin to TID 4. Mood: LCSW to follow for evaluation and support.              -antipsychotic agents: N/A 5. Neuropsych: This patient may be intermittently capable of making decisions on her  own behalf. 6. Skin/Wound Care: cervical wound looks great 7. Fluids/Electrolytes/Nutrition: Monitor I/O.   -encouraging PO -protein supp for low albumin -LFT's normal  8. Cervical fractures C4-C7 s/p ACDF: Collar to stay on for support.' -she'll follow up with NS in office for further recs 9. Left humeral head/neck fractures: May need Sarmiento brace for support as has not been compliant with sling -ortho to f/u  10. Pre-renal azotemia: Encourage fluid intake.  - BMP Latest Ref Rng & Units 12/06/2021 11/29/2021 11/24/2021  Glucose 70 - 99 mg/dL 134(H) 112(H) 120(H)   BUN 8 - 23 mg/dL 14 17 21   Creatinine 0.44 - 1.00 mg/dL 0.59 0.52 0.58  BUN/Creat Ratio 6 - 22 (calc) - - -  Sodium 135 - 145 mmol/L 138 137 137  Potassium 3.5 - 5.1 mmol/L 4.2 3.7 3.7  Chloride 98 - 111 mmol/L 98 97(L) 104  CO2 22 - 32 mmol/L 33(H) 32 26  Calcium 8.9 - 10.3 mg/dL 9.0 8.6(L) 8.7(L)  Resolved 2/28 12. Multiple rib fractures/Pulmonary contusion: Fluid overload: Leukocytosis: WBC  13. Acute on chronic anxiety issues:  To avoid benzo's (was d/c) per PCP note.   --  currently only on 0.25mg  xanax tid prn -added low dose seroquel to help with sleep 2/16              -continue Lexapro 5mg  qhs started on admit  -appreciate Dr. Ferne Coe assistance 14. Urinary retention:              Resolved 15. Fluid overload: Resolved off Diamox  16. Acute blood loss anemia:    Hemoglobin 9.0 on 2/21--holding at 8.5 2/27 17. Active smoker: discussed smoking history, she is determined not to smoke at home 18. COPD: changed oxygen order to maintain sats between 88 and 92%- prn ventolin    LOS: 14 days A FACE TO FACE EVALUATION WAS PERFORMED  Meredith Staggers 12/07/2021, 9:15 AM

## 2021-12-07 NOTE — Progress Notes (Signed)
Physical Therapy Discharge Summary  Patient Details  Name: [name] MRN: [id number] Date of Birth: [birth year]  Today's Date: 12/07/2021 PT Individual Time: 1950-9326 PT Individual Time Calculation (min): 44 min    Patient has met 8 of 8 long term goals due to improved activity tolerance, improved balance, improved postural control, and decreased pain.  Patient to discharge at an ambulatory level Supervision.   Patient's daughter attended family education and is independent to provide the necessary physical assistance at discharge.  Reasons goals not met: NA  Recommendation:  Patient will benefit from ongoing skilled PT services in outpatient setting to continue to advance safe functional mobility, address ongoing impairments in balance, strength, ambulation, and minimize fall risk.  Equipment: No equipment provided  Reasons for discharge: treatment goals met and discharge from hospital  Patient/family agrees with progress made and goals achieved: Yes  Skilled Therapeutic Interventions: Pt received seated in recliner and agrees to therapy. No complaint of pain. Wc transport to gym for time management. Pt performs sit to stand with cues for initiation. Pt ambulates x300' on room air, with cues for upright gaze to improve posture and balance, and increasing gait speed and stride length to decrease risk for falls. Pt noted to be at 81% O2 saturation following ambulation with corresponding shortness of breath. 1L of oxygen titrated through nasal cannula and pt recovers to >90% within x1 minute. Following seated rest break, pt completes x12 6" steps with R hand rail and cues for step sequencing. Pt ambulates additional 150' x2 with standing rest break to manipulate and retrieve drink from vending machine, working on dynamic balance and coordination. Pt left seated in WC with alarm intact and all needs within reach.  PT Discharge Precautions/Restrictions Precautions Precautions:  Fall;Back;Cervical Required Braces or Orthoses: Sling;Cervical Brace Cervical Brace: Hard collar;At all times Restrictions Weight Bearing Restrictions: Yes LUE Weight Bearing: Non weight bearing Other Position/Activity Restrictions: immobilizer sling Pain Interference Pain Interference Pain Effect on Sleep: 1. Rarely or not at all Pain Interference with Therapy Activities: 1. Rarely or not at all Pain Interference with Day-to-Day Activities: 3. Frequently Vision/Perception  Vision - History Ability to See in Adequate Light: 0 Adequate Perception Perception: Within Functional Limits Praxis Praxis: Intact  Cognition Overall Cognitive Status: Impaired/Different from baseline Arousal/Alertness: Awake/alert Orientation Level: Oriented X4 Year: 2023 Month: February Day of Week: Correct Memory: Impaired Memory Impairment: Decreased recall of new information Immediate Memory Recall: Sock;Blue;Bed Memory Recall Sock: Without Cue Memory Recall Blue: Without Cue Memory Recall Bed: Without Cue Safety/Judgment: Appears intact Sensation Sensation Light Touch: Appears Intact Hot/Cold: Appears Intact Coordination Gross Motor Movements are Fluid and Coordinated: No Fine Motor Movements are Fluid and Coordinated: Yes Coordination and Movement Description: Limited by severe L shoulder pain and weakness Finger Nose Finger Test: WFL on the R, unable to test on the L Motor  Motor Motor: Within Functional Limits  Mobility Transfers Sit to Stand: Supervision/Verbal cueing Stand to Sit: Supervision/Verbal cueing Transfer (Assistive device): None Locomotion  Gait Ambulation: Yes Gait Assistance: Supervision/Verbal cueing Gait Distance (Feet): 300 Feet Assistive device: None Gait Assistance Details: Verbal cues for sequencing;Verbal cues for gait pattern Gait Gait: Yes Gait Pattern: Impaired Gait Pattern: Decreased stride length Stairs / Additional Locomotion Stairs: Yes Stairs  Assistance: Supervision/Verbal cueing Stair Management Technique: One rail Right Number of Stairs: 12 Height of Stairs: 6 Ramp: Contact Guard/touching assist Curb: Supervision/Verbal cueing Wheelchair Mobility Wheelchair Mobility: No  Trunk/Postural Assessment  Cervical Assessment Cervical Assessment:  (c-COLLAR) Thoracic Assessment  Thoracic Assessment: Exceptions to [facility] (KYPHOTIC) Lumbar Assessment Lumbar Assessment: Exceptions to WFL (POST PELVIC TILT) Postural Control Postural Control: Deficits on evaluation (IMPROVED SINCE EVAL)  Balance Static Sitting Balance Static Sitting - Level of Assistance: 6: Modified independent (Device/Increase time) Dynamic Sitting Balance Dynamic Sitting - Level of Assistance: 6: Modified independent (Device/Increase time) Static Standing Balance Static Standing - Level of Assistance: 5: Stand by assistance Extremity Assessment  RUE Assessment RUE Assessment: Within Functional Limits LUE Assessment LUE Assessment: Exceptions to [facility] Active Range of Motion (AROM) Comments: No formal test, Immobilized in sling per orders. No order on ROM RLE Assessment RLE Assessment: Within Functional Limits General Strength Comments: Grossly 4+/5 LLE Assessment LLE Assessment: Within Functional Limits General Strength Comments: Grossly 4/5    Breck Coons, PT, DPT 12/07/2021, 2:50 PM

## 2021-12-08 ENCOUNTER — Other Ambulatory Visit (HOSPITAL_COMMUNITY): Payer: Self-pay

## 2021-12-08 DIAGNOSIS — R0902 Hypoxemia: Secondary | ICD-10-CM

## 2021-12-08 DIAGNOSIS — S2249XA Multiple fractures of ribs, unspecified side, initial encounter for closed fracture: Secondary | ICD-10-CM
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
## 2021-12-08 NOTE — Progress Notes (Signed)
Inpatient Rehabilitation Care Coordinator ?Discharge Note  ? ?Patient Details  ?Name: [name] ?MRN: [id number] ?Date of Birth: Dec 25, 1960 ? ? ?Discharge location: D/c to home with her dtr and granddaughter ? ?Length of Stay: 14 days ? ?Discharge activity level: Supervision ? ?Home/community participation: Limited ? ?Patient response JJ:[id number] Literacy - How often do you need to have someone help you when you read instructions, pamphlets, or other written material from your doctor or pharmacy?: Never ? ?Patient response WE:[id number] Isolation - How often do you feel lonely or isolated from those around you?: Never ? ?Services provided included: MD, RD, PT, OT, SLP, RN, CM, TR, Pharmacy, Neuropsych, SW ? ?Financial Services:  ?Charity fundraiser Utilized: Private Insurance ?[employer] ? ?Choices offered to/list presented to: Yes ? ?Follow-up services arranged:  ?Outpatient, DME ?   ?Outpatient Servicies: [facility] for outpatient PT/OT ?DME : [employer] for home 02 and 3in1 BSC ?  ? ?Patient response to transportation need: ?Is the patient able to respond to transportation needs?: Yes ?In the past 12 months, has lack of transportation kept you from medical appointments or from getting medications?: No ?In the past 12 months, has lack of transportation kept you from meetings, work, or from getting things needed for daily living?: No ? ?Comments (or additional information): ? ?Patient/Family verbalized understanding of follow-up arrangements:  Yes ? ?Individual responsible for coordination of the follow-up plan: contact pt or pt dtr Candice ? ?Confirmed correct DME delivered: Rana Snare 12/08/2021   ? ?Rana Snare ?

## 2021-12-09 ENCOUNTER — Telehealth: Payer: Self-pay
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
## 2021-12-15 ENCOUNTER — Ambulatory Visit: Payer: 59

## 2021-12-15 ENCOUNTER — Encounter: Payer: Self-pay | Admitting: Family Medicine

## 2021-12-15 ENCOUNTER — Other Ambulatory Visit (HOSPITAL_COMMUNITY): Payer: Self-pay

## 2021-12-15 ENCOUNTER — Ambulatory Visit (INDEPENDENT_AMBULATORY_CARE_PROVIDER_SITE_OTHER): Payer: 59 | Admitting: Family Medicine

## 2021-12-15 ENCOUNTER — Other Ambulatory Visit: Payer: Self-pay

## 2021-12-15 ENCOUNTER — Ambulatory Visit: Payer: 59 | Attending: Physical Medicine and Rehabilitation | Admitting: Occupational Therapy

## 2021-12-15 VITALS — BP 102/64 | HR 70 | Temp 97.6°F | Ht 67.0 in | Wt 133.2 lb

## 2021-12-15 DIAGNOSIS — R202 Paresthesia of skin: Secondary | ICD-10-CM | POA: Diagnosis present

## 2021-12-15 DIAGNOSIS — R2681 Unsteadiness on feet: Secondary | ICD-10-CM | POA: Diagnosis present

## 2021-12-15 DIAGNOSIS — S129XXD Fracture of neck, unspecified, subsequent encounter: Secondary | ICD-10-CM

## 2021-12-15 DIAGNOSIS — R2689 Other abnormalities of gait and mobility: Secondary | ICD-10-CM

## 2021-12-15 DIAGNOSIS — D62 Acute posthemorrhagic anemia: Secondary | ICD-10-CM | POA: Diagnosis not present

## 2021-12-15 DIAGNOSIS — M79602 Pain in left arm: Secondary | ICD-10-CM | POA: Diagnosis present

## 2021-12-15 DIAGNOSIS — J449 Chronic obstructive pulmonary disease, unspecified: Secondary | ICD-10-CM | POA: Diagnosis not present

## 2021-12-15 DIAGNOSIS — R29898 Other symptoms and signs involving the musculoskeletal system: Secondary | ICD-10-CM | POA: Insufficient documentation

## 2021-12-15 DIAGNOSIS — F411 Generalized anxiety disorder: Secondary | ICD-10-CM | POA: Diagnosis not present

## 2021-12-15 DIAGNOSIS — M6281 Muscle weakness (generalized): Secondary | ICD-10-CM | POA: Insufficient documentation

## 2021-12-15 MED ORDER — ESCITALOPRAM OXALATE 10 MG PO TABS: 10.0000 mg | ORAL_TABLET | Freq: Every day | ORAL | 3 refills | Status: DC
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
## 2021-12-15 NOTE — Patient Instructions (Signed)
Try over the counter iron sulfate one daily.   ? ?Monitor pulse oximetry and OK to start leaving off the oxygen during the day if O2 sats > 90%.    ? ? ?
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
## 2021-12-16 ENCOUNTER — Other Ambulatory Visit (HOSPITAL_COMMUNITY): Payer: Self-pay
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
## 2021-12-20 ENCOUNTER — Telehealth: Payer: Self-pay | Admitting: Family Medicine

## 2021-12-20 ENCOUNTER — Other Ambulatory Visit: Payer: Self-pay | Admitting: Family Medicine

## 2021-12-20 ENCOUNTER — Other Ambulatory Visit (HOSPITAL_COMMUNITY): Payer: Self-pay

## 2021-12-20 NOTE — Telephone Encounter (Signed)
Patient is requesting for Dr.Burchette to order her a nebulizer and albuterol. ? ? ?Please advise. ?

## 2021-12-20 NOTE — Telephone Encounter (Signed)
Patient stated that [facility] informed her that nebulizer needs to be sent to [facility] on [address] because they cannot bill the insurance. ? ?Please advise. ?

## 2021-12-21 MED ORDER — ALBUTEROL SULFATE (2.5 MG/3ML) 0.083% IN NEBU: INHALATION_SOLUTION | RESPIRATORY_TRACT | 1 refills | Status: DC

## 2021-12-21 NOTE — Telephone Encounter (Signed)
Rx has been faxed to [facility] ?

## 2021-12-21 NOTE — Telephone Encounter (Signed)
Patient called in stating that she didn't receive her albuterol medication. [facility] [address] [place], [place] [address]. ? ?Please advise. ?

## 2021-12-21 NOTE — Telephone Encounter (Signed)
Rx sent 

## 2021-12-21 NOTE — Addendum Note (Signed)
Addended by: Nilda Riggs on: 12/21/2021 02:59 PM ? ? Modules accepted: Orders ? ?

## 2021-12-22 ENCOUNTER — Ambulatory Visit: Payer: 59 | Admitting: Occupational Therapy

## 2021-12-22 ENCOUNTER — Ambulatory Visit: Payer: 59

## 2021-12-23 ENCOUNTER — Other Ambulatory Visit: Payer: Self-pay | Admitting: Family Medicine

## 2021-12-27 ENCOUNTER — Ambulatory Visit: Payer: 59 | Admitting: Occupational Therapy

## 2021-12-27 ENCOUNTER — Ambulatory Visit: Payer: 59 | Admitting: Physical Therapy

## 2022-01-05 ENCOUNTER — Ambulatory Visit: Payer: 59 | Admitting: Rehabilitative and Restorative Service Providers"

## 2022-01-05 ENCOUNTER — Ambulatory Visit: Payer: 59 | Admitting: Occupational Therapy

## 2022-01-06 ENCOUNTER — Other Ambulatory Visit: Payer: Self-pay | Admitting: Family Medicine

## 2022-01-12 ENCOUNTER — Encounter: Payer: 59 | Admitting: Registered Nurse

## 2022-01-12 ENCOUNTER — Ambulatory Visit: Payer: 59 | Attending: Physical Medicine and Rehabilitation | Admitting: Physical Therapy

## 2022-01-12 ENCOUNTER — Ambulatory Visit: Payer: 59 | Admitting: Occupational Therapy

## 2022-01-19 ENCOUNTER — Ambulatory Visit: Payer: 59 | Admitting: Physical Therapy

## 2022-01-19 ENCOUNTER — Ambulatory Visit: Payer: 59 | Admitting: Occupational Therapy

## 2022-01-26 ENCOUNTER — Ambulatory Visit: Payer: 59

## 2022-01-26 ENCOUNTER — Encounter: Payer: 59 | Admitting: Occupational Therapy

## 2022-02-02 ENCOUNTER — Encounter: Payer: 59 | Admitting: Occupational Therapy

## 2022-02-02 ENCOUNTER — Ambulatory Visit: Payer: 59

## 2022-02-11 ENCOUNTER — Other Ambulatory Visit: Payer: Self-pay | Admitting: Neurological Surgery

## 2022-02-11 ENCOUNTER — Other Ambulatory Visit (HOSPITAL_COMMUNITY): Payer: Self-pay | Admitting: Neurological Surgery

## 2022-02-11 DIAGNOSIS — M542 Cervicalgia: Secondary | ICD-10-CM

## 2022-02-18 ENCOUNTER — Ambulatory Visit (HOSPITAL_COMMUNITY)
Admission: RE | Admit: 2022-02-18 | Discharge: 2022-02-18 | Disposition: A | Discharge status: Home / Self Care | Payer: 59 | Source: Ambulatory Visit | Attending: Neurological Surgery | Admitting: Neurological Surgery

## 2022-02-18 DIAGNOSIS — M542 Cervicalgia: Secondary | ICD-10-CM | POA: Insufficient documentation

## 2022-02-23 ENCOUNTER — Encounter: Payer: Self-pay | Admitting: Registered Nurse

## 2022-02-23 ENCOUNTER — Encounter: Payer: 59 | Attending: Registered Nurse | Admitting: Registered Nurse

## 2022-02-23 VITALS — BP 130/84 | HR 91 | Ht 67.0 in | Wt 127.4 lb

## 2022-02-23 DIAGNOSIS — Z79891 Long term (current) use of opiate analgesic: Secondary | ICD-10-CM

## 2022-02-23 DIAGNOSIS — F411 Generalized anxiety disorder: Secondary | ICD-10-CM | POA: Insufficient documentation

## 2022-02-23 DIAGNOSIS — Z5181 Encounter for therapeutic drug level monitoring: Secondary | ICD-10-CM | POA: Diagnosis present

## 2022-02-23 DIAGNOSIS — G894 Chronic pain syndrome: Secondary | ICD-10-CM

## 2022-02-23 DIAGNOSIS — M792 Neuralgia and neuritis, unspecified: Secondary | ICD-10-CM | POA: Diagnosis present

## 2022-02-23 DIAGNOSIS — G8918 Other acute postprocedural pain: Secondary | ICD-10-CM | POA: Diagnosis present

## 2022-02-23 DIAGNOSIS — T1490XA Injury, unspecified, initial encounter: Secondary | ICD-10-CM | POA: Diagnosis present

## 2022-02-23 DIAGNOSIS — Z79899 Other long term (current) drug therapy: Secondary | ICD-10-CM | POA: Diagnosis present
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
## 2022-02-24 ENCOUNTER — Other Ambulatory Visit: Payer: Self-pay | Admitting: Family Medicine

## 2022-02-28 ENCOUNTER — Telehealth: Payer: Self-pay | Admitting: *Deleted

## 2022-02-28 NOTE — Telephone Encounter (Signed)
Mrs Roderick called and reports that she went back to see her neurosurgeon and she is going to have to have another surgery on her neck. She was asking if her UDS results were back but they are not. She put a call in to Dr Ronnald Ramp to ask for a refill on her pain medication but has not heard. I told her to let us know if he does prescribe something.

## 2022-03-02 ENCOUNTER — Telehealth (INDEPENDENT_AMBULATORY_CARE_PROVIDER_SITE_OTHER): Payer: 59 | Admitting: Family Medicine

## 2022-03-02 ENCOUNTER — Encounter: Payer: Self-pay | Admitting: Family Medicine

## 2022-03-02 ENCOUNTER — Telehealth: Payer: Self-pay | Admitting: *Deleted

## 2022-03-02 VITALS — Ht 67.0 in | Wt 127.4 lb

## 2022-03-02 DIAGNOSIS — F419 Anxiety disorder, unspecified: Secondary | ICD-10-CM

## 2022-03-02 LAB — TOXASSURE SELECT,+ANTIDEPR,UR

## 2022-03-02 MED ORDER — BUSPIRONE HCL 15 MG PO TABS: ORAL_TABLET | ORAL | 2 refills | Status: DC

## 2022-03-02 NOTE — Telephone Encounter (Signed)
Urine drug screen for this encounter is consistent for prescribed medication 

## 2022-03-02 NOTE — Telephone Encounter (Signed)
PMP was Reviewed. UDS was Consistent.  Patient received Oxycodone 5 mg #30 tablets on
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
## 2022-03-02 NOTE — Telephone Encounter (Signed)
PMP was Reviewed. UDS was Consistent.  Patient was seen on 05.18/2023 Patient received Oxycodone 5 mg #30 tablets on 02/28/2022 by Fenton Malling NP.  Placed a call to [facility], awaiting a return call: Left message to fax office note.  Placed a call to Ms. Morino, regarding the above, she verbalizes understanding.

## 2022-03-02 NOTE — Telephone Encounter (Signed)
Per [name] she has 3 day supply of Oxycodone 5 MG on hand. She reported taking one tab every six hours.   Filled  Written  ID  Drug  QTY  Days  Prescriber  RX #  Dispenser  Refill  Daily Dose*  Pymt Type  PMP  02/28/2022 02/28/2022 1  Oxycodone Hcl (Ir) 5 Mg Tablet 30.00 7 Arliss Journey [id number] Nor ([id number]) 0/0 32.14 MME Private Pay [place]  Call back phone [phone].

## 2022-03-08 ENCOUNTER — Telehealth: Payer: Self-pay | Admitting: Registered Nurse

## 2022-03-08 MED ORDER — OXYCODONE-ACETAMINOPHEN 5-325 MG PO TABS: 1.0000 | ORAL_TABLET | Freq: Four times a day (QID) | ORAL | 0 refills | Status: DC | PRN

## 2022-03-08 NOTE — Addendum Note (Signed)
Addended by: Alger Simons T on: 03/08/2022 12:23 PM   Modules accepted: Orders

## 2022-03-08 NOTE — Telephone Encounter (Signed)
  Patient is out of pain medication. FYI: The pharmacy has #22 Oxycodone  5 MG on hand. And the do have it with Tylenol. [name] stated she can take the Tylenol. Please advise.     PMP  Filled  Written  ID  Drug  QTY  Days  Prescriber  RX #  Dispenser  Refill  Daily Dose*  Pymt Type  PMP  02/28/2022 02/28/2022 1  Oxycodone Hcl (Ir) 5 Mg Tablet 30.00 7 Arliss Journey [id number] Nor ([id number]) 0/0 32.14 MME Private Pay [place] 02/22/2022 02/22/2022 1  Oxycodone Hcl (Ir) 5 Mg Tablet 30.00 7 Da Wille Glaser [id number] Nor (3235) 0/0 32.14 MME Private Pay [place] 02/17/2022 02/17/2022 1  Oxycodone Hcl (Ir) 5 Mg Tablet 30.00 7 Da Wille Glaser [id number] Nor (3235) 0/0 32.14 MME Comm Ins [place] 02/11/2022 02/11/2022 1  Oxycodone Hcl (Ir) 5 Mg Tablet 30.00 7 Jo Mcd [id number] Nor (3235) 0/0 32.14 MME Comm Ins [place]

## 2022-03-08 NOTE — Telephone Encounter (Signed)
Message sent to Dr Naaman Plummer this morning,  Awaiting his response.  Zella Ball

## 2022-03-08 NOTE — Telephone Encounter (Signed)
Dr Naaman Plummer,  Thi patient was discharged from hospital on 12/08/2021.  I read Dr Elease Hashimoto note from 03/02/2022, she does have a History of Cocaine and Benzo on 06/10/2019.   She was discharged on Oxycodone, at this time Dr Ronnald Ramp is prescribing, she will also need another surgery, according to the office note I obtained.   Due to the above, Dr Ronnald Ramp should be prescribing the Oxycodone am I correct? I will await your response.

## 2022-03-08 NOTE — Telephone Encounter (Signed)
[name],   This pt has been receiving 30 oxycodones which tend to last her about a week. The rx says q6 prn.   I sent in an rx for percocet 5's #60 today.   She has a visit with you on 6/16 I believe. She needs a CSA and a discussion on where we're going with her pain at this visit. She has been out of the hospital since March. I would expect that her pain should be improving at this point  Thanks Z

## 2022-03-15 ENCOUNTER — Other Ambulatory Visit: Payer: Self-pay | Admitting: Family Medicine

## 2022-03-15 ENCOUNTER — Other Ambulatory Visit: Payer: Self-pay | Admitting: Neurological Surgery

## 2022-03-16 ENCOUNTER — Telehealth: Payer: Self-pay

## 2022-03-16 MED ORDER — OXYCODONE HCL 5 MG PO TABS: 5.0000 mg | ORAL_TABLET | Freq: Four times a day (QID) | ORAL | 0 refills | Status: DC | PRN

## 2022-03-16 NOTE — Telephone Encounter (Signed)
Placed a call to Ms. Raneri,  She states her pain is located in her neck m upper- lower back pain, she states her pin medication only giving her 4 hours of relief, she admits e hd taken an extra oxycodone for a few days. Narcotic Contract was reviewed, she realizes if this occurs again can lead to being dischrged from our office. She verbalizes understanding.  She is scheduled for surgery on 03/25/2022, with Dr Ronnald Ramp.  She will be given a week prescription, her surgeon will prescribe post- op , she verbalizes understanding.

## 2022-03-16 NOTE — Telephone Encounter (Signed)
Pt states has #11 Oxycodone/APAP left, she has been taking #6 a day and that is not working. She states that her next appt is 03/23/2022. She states she has found Oxycodone 5 and 10 mg at [facility] in [place].

## 2022-03-17 NOTE — Pre-Procedure Instructions (Signed)
Surgical Instructions    Your procedure is scheduled on Friday, March 25, 2022.  Report to [facility] Main Entrance "A" at 5:30 A.M., then check in with the Admitting office.  Call this number if you have problems the morning of surgery:  [phone]   If you have any questions prior to your surgery date call [phone]: Open Monday-Friday 8am-4pm    Remember:  Do not eat or drink after midnight the night before your surgery    Take these medicines the morning of surgery with A SIP OF WATER:   acetaminophen (TYLENOL)  escitalopram (LEXAPRO)  busPIRone (BUSPAR)  gabapentin (NEURONTIN)     Take these medicines AS NEEDED the morning of surgery:  albuterol (PROVENTIL)   albuterol (VENTOLIN HFA) - please bring inhaler to hospital  methocarbamol (ROBAXIN)  oxyCODONE (ROXICODONE)    As of today, STOP taking any Aspirin (unless otherwise instructed by your surgeon) Aleve, Naproxen, Ibuprofen, Motrin, Advil, Goody's, BC's, all herbal medications, fish oil, and all vitamins.           Do not wear jewelry or makeup Do not wear lotions, powders, perfumes, or deodorant. Do not shave 48 hours prior to surgery. Do not bring valuables to the hospital. Do not wear nail polish, gel polish, artificial nails, or any other type of covering on natural nails (fingers and toes) If you have artificial nails or gel coating that need to be removed by a nail salon, please have this removed prior to surgery. Artificial nails or gel coating may interfere with anesthesia's ability to adequately monitor your vital signs.  [facility] is not responsible for any belongings or valuables. .   Do NOT Smoke (Tobacco/Vaping)  24 hours prior to your procedure  If you use a CPAP at night, you may bring your mask for your overnight stay.   Contacts, glasses, hearing aids, dentures or partials may not be worn into surgery, please bring cases for these belongings   For patients admitted to the hospital, discharge  time will be determined by your treatment team.   Patients discharged the day of surgery will not be allowed to drive home, and someone needs to stay with them for 24 hours.   SURGICAL WAITING ROOM VISITATION Patients having surgery or a procedure in a hospital may have two support people. Children under the age of 43 must have an adult with them who is not the patient. They may stay in the waiting area during the procedure and may switch out with other visitors. If the patient needs to stay at the hospital during part of their recovery, the visitor guidelines for inpatient rooms apply.  Please refer to the [facility] website for the visitor guidelines for Inpatients (after your surgery is over and you are in a regular room).       Special instructions:    Oral Hygiene is also important to reduce your risk of infection.  Remember - BRUSH YOUR TEETH THE MORNING OF SURGERY WITH YOUR REGULAR TOOTHPASTE   [place]- Preparing For Surgery  Before surgery, you can play an important role. Because skin is not sterile, your skin needs to be as free of germs as possible. You can reduce the number of germs on your skin by washing with CHG (chlorahexidine gluconate) Soap before surgery.  CHG is an antiseptic cleaner which kills germs and bonds with the skin to continue killing germs even after washing.     Please do not use if you have an allergy to  CHG or antibacterial soaps. If your skin becomes reddened/irritated stop using the CHG.  Do not shave (including legs and underarms) for at least 48 hours prior to first CHG shower. It is OK to shave your face.  Please follow these instructions carefully.     Shower the NIGHT BEFORE SURGERY and the MORNING OF SURGERY with CHG Soap.   If you chose to wash your hair, wash your hair first as usual with your normal shampoo. After you shampoo, rinse your hair and body thoroughly to remove the shampoo.  Then [employer] and genitals (private parts) with  your normal soap and rinse thoroughly to remove soap.  After that Use CHG Soap as you would any other liquid soap. You can apply CHG directly to the skin and wash gently with a scrungie or a clean washcloth.   Apply the CHG Soap to your body ONLY FROM THE NECK DOWN.  Do not use on open wounds or open sores. Avoid contact with your eyes, ears, mouth and genitals (private parts). Wash Face and genitals (private parts)  with your normal soap.   Wash thoroughly, paying special attention to the area where your surgery will be performed.  Thoroughly rinse your body with warm water from the neck down.  DO NOT shower/wash with your normal soap after using and rinsing off the CHG Soap.  Pat yourself dry with a CLEAN TOWEL.  Wear CLEAN PAJAMAS to bed the night before surgery  Place CLEAN SHEETS on your bed the night before your surgery  DO NOT SLEEP WITH PETS.   Day of Surgery:  Take a shower with CHG soap. Wear Clean/Comfortable clothing the morning of surgery Do not apply any deodorants/lotions.   Remember to brush your teeth WITH YOUR REGULAR TOOTHPASTE.    If you received a COVID test during your pre-op visit, it is requested that you wear a mask when out in public, stay away from anyone that may not be feeling well, and notify your surgeon if you develop symptoms. If you have been in contact with anyone that has tested positive in the last 10 days, please notify your surgeon.    Please read over the following fact sheets that you were given.

## 2022-03-18 ENCOUNTER — Encounter (HOSPITAL_COMMUNITY): Payer: Self-pay

## 2022-03-18 ENCOUNTER — Encounter (HOSPITAL_COMMUNITY)
Admission: RE | Admit: 2022-03-18 | Discharge: 2022-03-18 | Disposition: A | Discharge status: Home / Self Care | Payer: 59 | Source: Ambulatory Visit | Attending: Neurological Surgery | Admitting: Neurological Surgery

## 2022-03-18 ENCOUNTER — Other Ambulatory Visit: Payer: Self-pay

## 2022-03-18 VITALS — BP 140/71 | Temp 98.1°F | Resp 17 | Ht 67.0 in | Wt 129.0 lb

## 2022-03-18 DIAGNOSIS — Z01818 Encounter for other preprocedural examination: Secondary | ICD-10-CM | POA: Insufficient documentation

## 2022-03-18 DIAGNOSIS — M960 Pseudarthrosis after fusion or arthrodesis: Secondary | ICD-10-CM | POA: Diagnosis not present

## 2022-03-18 DIAGNOSIS — K219 Gastro-esophageal reflux disease without esophagitis: Secondary | ICD-10-CM | POA: Diagnosis not present

## 2022-03-18 DIAGNOSIS — J439 Emphysema, unspecified: Secondary | ICD-10-CM | POA: Insufficient documentation

## 2022-03-18 HISTORY — DX: Unspecified osteoarthritis, unspecified site: M19.90

## 2022-03-18 HISTORY — DX: Dyspnea, unspecified: R06.00

## 2022-03-18 LAB — TYPE AND SCREEN
ABO/RH(D): A POS
Antibody Screen: NEGATIVE

## 2022-03-18 LAB — CBC
HCT: 42.5 % (ref 36.0–46.0)
Hemoglobin: 13.5 g/dL (ref 12.0–15.0)
MCH: 29.9 pg (ref 26.0–34.0)
MCHC: 31.8 g/dL (ref 30.0–36.0)
MCV: 94 fL (ref 80.0–100.0)
Platelets: 258 10*3/uL (ref 150–400)
RBC: 4.52 MIL/uL (ref 3.87–5.11)
RDW: 14.5 % (ref 11.5–15.5)
WBC: 7.3 10*3/uL (ref 4.0–10.5)
nRBC: 0 % (ref 0.0–0.2)

## 2022-03-18 LAB — PROTIME-INR
INR: 0.9 (ref 0.8–1.2)
Prothrombin Time: 12.4 seconds (ref 11.4–15.2)

## 2022-03-18 LAB — SURGICAL PCR SCREEN
MRSA, PCR: NEGATIVE
Staphylococcus aureus: NEGATIVE

## 2022-03-18 NOTE — Progress Notes (Signed)
PCP - Dr. Carolann Littler Cardiologist - Saw Dr. Buford Dresser in 2020 after acute syncopal episode.  PPM/ICD - denies Device Orders - n/a Rep Notified - n/a  Chest x-ray - n/a EKG - 03/18/2022 Stress Test - 06/24/14 ECHO - 06/10/2019 Cardiac Cath - denies  Sleep Study - denies CPAP - n/a  No DM  Blood Thinner Instructions: n/a Aspirin Instructions: Per pt, she has already stopped her Aspirin per her physicians instructions.  ERAS Protcol - No. NPO  COVID TEST- n/a   Anesthesia review: Yes. Current diagnosis of COPD  Patient denies shortness of breath, fever, cough and chest pain at PAT appointment   All instructions explained to the patient, with a verbal understanding of the material. Patient agrees to go over the instructions while at home for a better understanding. Patient also instructed to self quarantine after being tested for COVID-19. The opportunity to ask questions was provided.
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
## 2022-03-23 ENCOUNTER — Ambulatory Visit: Payer: 59 | Admitting: Registered Nurse

## 2022-03-25 ENCOUNTER — Inpatient Hospital Stay (HOSPITAL_COMMUNITY): Payer: 59 | Admitting: Vascular Surgery

## 2022-03-25 ENCOUNTER — Encounter (HOSPITAL_COMMUNITY)
Admission: RE | Disposition: A | Discharge status: Home / Self Care | Payer: Self-pay | Source: Home / Self Care | Attending: Neurological Surgery

## 2022-03-25 ENCOUNTER — Other Ambulatory Visit: Payer: Self-pay

## 2022-03-25 ENCOUNTER — Encounter (HOSPITAL_COMMUNITY): Payer: Self-pay | Admitting: Neurological Surgery

## 2022-03-25 ENCOUNTER — Inpatient Hospital Stay (HOSPITAL_COMMUNITY)
Admission: RE | Admit: 2022-03-25 | Discharge: 2022-03-26 | DRG: 472 | Disposition: A | Discharge status: Home / Self Care | Payer: 59 | Attending: Neurosurgery | Admitting: Neurosurgery

## 2022-03-25 ENCOUNTER — Inpatient Hospital Stay (HOSPITAL_COMMUNITY): Payer: 59

## 2022-03-25 DIAGNOSIS — G5603 Carpal tunnel syndrome, bilateral upper limbs: Secondary | ICD-10-CM | POA: Diagnosis present

## 2022-03-25 DIAGNOSIS — S13170A Subluxation of C6/C7 cervical vertebrae, initial encounter: Secondary | ICD-10-CM | POA: Diagnosis present

## 2022-03-25 DIAGNOSIS — M47812 Spondylosis without myelopathy or radiculopathy, cervical region: Secondary | ICD-10-CM | POA: Diagnosis present

## 2022-03-25 DIAGNOSIS — M1990 Unspecified osteoarthritis, unspecified site: Secondary | ICD-10-CM

## 2022-03-25 DIAGNOSIS — Z886 Allergy status to analgesic agent status: Secondary | ICD-10-CM | POA: Diagnosis not present

## 2022-03-25 DIAGNOSIS — Z9049 Acquired absence of other specified parts of digestive tract: Secondary | ICD-10-CM

## 2022-03-25 DIAGNOSIS — Z8619 Personal history of other infectious and parasitic diseases: Secondary | ICD-10-CM

## 2022-03-25 DIAGNOSIS — Z79899 Other long term (current) drug therapy: Secondary | ICD-10-CM | POA: Diagnosis not present

## 2022-03-25 DIAGNOSIS — M542 Cervicalgia: Secondary | ICD-10-CM | POA: Diagnosis present

## 2022-03-25 DIAGNOSIS — T86838 Other complications of bone graft: Secondary | ICD-10-CM | POA: Diagnosis present

## 2022-03-25 DIAGNOSIS — F410 Panic disorder [episodic paroxysmal anxiety] without agoraphobia: Secondary | ICD-10-CM | POA: Diagnosis present

## 2022-03-25 DIAGNOSIS — F418 Other specified anxiety disorders: Secondary | ICD-10-CM

## 2022-03-25 DIAGNOSIS — K219 Gastro-esophageal reflux disease without esophagitis: Secondary | ICD-10-CM | POA: Diagnosis present

## 2022-03-25 DIAGNOSIS — Y838 Other surgical procedures as the cause of abnormal reaction of the patient, or of later complication, without mention of misadventure at the time of the procedure: Secondary | ICD-10-CM | POA: Diagnosis present

## 2022-03-25 DIAGNOSIS — F17210 Nicotine dependence, cigarettes, uncomplicated: Secondary | ICD-10-CM | POA: Diagnosis present

## 2022-03-25 DIAGNOSIS — J449 Chronic obstructive pulmonary disease, unspecified: Secondary | ICD-10-CM | POA: Diagnosis present

## 2022-03-25 DIAGNOSIS — Z981 Arthrodesis status: Principal | ICD-10-CM

## 2022-03-25 HISTORY — PX: POSTERIOR CERVICAL FUSION/FORAMINOTOMY: SHX5038

## 2022-03-25 SURGERY — POSTERIOR CERVICAL FUSION/FORAMINOTOMY LEVEL 3
Anesthesia: General | Site: Spine Cervical

## 2022-03-25 MED ORDER — SODIUM CHLORIDE 0.9% FLUSH
3.0000 mL | Freq: Two times a day (BID) | INTRAVENOUS | Status: DC
  Administered 2022-03-25: 3 mL via INTRAVENOUS

## 2022-03-25 MED ORDER — PHENOL 1.4 % MT LIQD: 1.0000 | OROMUCOSAL | Status: DC | PRN

## 2022-03-25 MED ORDER — LIDOCAINE 2% (20 MG/ML) 5 ML SYRINGE
INTRAMUSCULAR | Status: AC
  Filled 2022-03-25: qty 5

## 2022-03-25 MED ORDER — SUGAMMADEX SODIUM 200 MG/2ML IV SOLN
INTRAVENOUS | Status: DC | PRN
  Administered 2022-03-25: 120 mg via INTRAVENOUS

## 2022-03-25 MED ORDER — ONDANSETRON HCL 4 MG/2ML IJ SOLN: 4.0000 mg | Freq: Four times a day (QID) | INTRAMUSCULAR | Status: DC | PRN

## 2022-03-25 MED ORDER — ONDANSETRON HCL 4 MG/2ML IJ SOLN
INTRAMUSCULAR | Status: DC | PRN
  Administered 2022-03-25: 4 mg via INTRAVENOUS

## 2022-03-25 MED ORDER — FENTANYL CITRATE (PF) 100 MCG/2ML IJ SOLN
INTRAMUSCULAR | Status: AC
  Filled 2022-03-25: qty 2

## 2022-03-25 MED ORDER — FENTANYL CITRATE (PF) 100 MCG/2ML IJ SOLN
25.0000 ug | INTRAMUSCULAR | Status: DC | PRN
  Administered 2022-03-25 (×2): 50 ug via INTRAVENOUS

## 2022-03-25 MED ORDER — METHOCARBAMOL 500 MG PO TABS
500.0000 mg | ORAL_TABLET | Freq: Four times a day (QID) | ORAL | Status: DC | PRN
  Administered 2022-03-25 – 2022-03-26 (×4): 500 mg via ORAL
  Filled 2022-03-25 (×4): qty 1

## 2022-03-25 MED ORDER — SODIUM CHLORIDE 0.9 % IV SOLN: 250.0000 mL | INTRAVENOUS | Status: DC

## 2022-03-25 MED ORDER — CEFAZOLIN SODIUM-DEXTROSE 2-4 GM/100ML-% IV SOLN
2.0000 g | INTRAVENOUS | Status: AC
  Administered 2022-03-25: 2 g via INTRAVENOUS
  Filled 2022-03-25: qty 100

## 2022-03-25 MED ORDER — BUPIVACAINE HCL (PF) 0.25 % IJ SOLN
INTRAMUSCULAR | Status: AC
  Filled 2022-03-25: qty 30

## 2022-03-25 MED ORDER — POTASSIUM CHLORIDE IN NACL 20-0.9 MEQ/L-% IV SOLN: INTRAVENOUS | Status: DC

## 2022-03-25 MED ORDER — ROCURONIUM BROMIDE 10 MG/ML (PF) SYRINGE
PREFILLED_SYRINGE | INTRAVENOUS | Status: DC | PRN
  Administered 2022-03-25: 60 mg via INTRAVENOUS
  Administered 2022-03-25: 20 mg via INTRAVENOUS

## 2022-03-25 MED ORDER — PHENYLEPHRINE 80 MCG/ML (10ML) SYRINGE FOR IV PUSH (FOR BLOOD PRESSURE SUPPORT)
PREFILLED_SYRINGE | INTRAVENOUS | Status: AC
  Filled 2022-03-25: qty 10

## 2022-03-25 MED ORDER — MORPHINE SULFATE (PF) 2 MG/ML IV SOLN
2.0000 mg | INTRAVENOUS | Status: DC | PRN
  Administered 2022-03-25 (×2): 2 mg via INTRAVENOUS
  Filled 2022-03-25 (×2): qty 1

## 2022-03-25 MED ORDER — MIDAZOLAM HCL 2 MG/2ML IJ SOLN
INTRAMUSCULAR | Status: DC | PRN
  Administered 2022-03-25: 2 mg via INTRAVENOUS

## 2022-03-25 MED ORDER — CHLORHEXIDINE GLUCONATE CLOTH 2 % EX PADS: 6.0000 | MEDICATED_PAD | Freq: Once | CUTANEOUS | Status: DC

## 2022-03-25 MED ORDER — CEFAZOLIN SODIUM-DEXTROSE 2-4 GM/100ML-% IV SOLN
2.0000 g | Freq: Three times a day (TID) | INTRAVENOUS | Status: AC
  Administered 2022-03-25 (×2): 2 g via INTRAVENOUS
  Filled 2022-03-25 (×2): qty 100

## 2022-03-25 MED ORDER — VANCOMYCIN HCL 1000 MG IV SOLR
INTRAVENOUS | Status: AC
Start: 2022-03-25 — End: ?
  Filled 2022-03-25: qty 20

## 2022-03-25 MED ORDER — ONDANSETRON HCL 4 MG PO TABS: 4.0000 mg | ORAL_TABLET | Freq: Four times a day (QID) | ORAL | Status: DC | PRN

## 2022-03-25 MED ORDER — LIDOCAINE 2% (20 MG/ML) 5 ML SYRINGE
INTRAMUSCULAR | Status: DC | PRN
  Administered 2022-03-25: 50 mg via INTRAVENOUS

## 2022-03-25 MED ORDER — THROMBIN 20000 UNITS EX SOLR: CUTANEOUS | Status: DC | PRN

## 2022-03-25 MED ORDER — THROMBIN 5000 UNITS EX SOLR
CUTANEOUS | Status: AC
  Filled 2022-03-25: qty 5000

## 2022-03-25 MED ORDER — SUFENTANIL CITRATE 50 MCG/ML IV SOLN
INTRAVENOUS | Status: DC | PRN
  Administered 2022-03-25 (×4): 5 ug via INTRAVENOUS

## 2022-03-25 MED ORDER — EPHEDRINE 5 MG/ML INJ
INTRAVENOUS | Status: AC
  Filled 2022-03-25: qty 5

## 2022-03-25 MED ORDER — DEXAMETHASONE SODIUM PHOSPHATE 10 MG/ML IJ SOLN
INTRAMUSCULAR | Status: AC
  Filled 2022-03-25: qty 1

## 2022-03-25 MED ORDER — ESMOLOL HCL 100 MG/10ML IV SOLN
INTRAVENOUS | Status: AC
  Filled 2022-03-25: qty 10

## 2022-03-25 MED ORDER — SENNA 8.6 MG PO TABS
1.0000 | ORAL_TABLET | Freq: Two times a day (BID) | ORAL | Status: DC
  Administered 2022-03-25 – 2022-03-26 (×2): 8.6 mg via ORAL
  Filled 2022-03-25 (×2): qty 1

## 2022-03-25 MED ORDER — 0.9 % SODIUM CHLORIDE (POUR BTL) OPTIME
TOPICAL | Status: DC | PRN
  Administered 2022-03-25: 1000 mL

## 2022-03-25 MED ORDER — EPHEDRINE SULFATE-NACL 50-0.9 MG/10ML-% IV SOSY
PREFILLED_SYRINGE | INTRAVENOUS | Status: DC | PRN
  Administered 2022-03-25: 10 mg via INTRAVENOUS

## 2022-03-25 MED ORDER — SODIUM CHLORIDE (PF) 0.9 % IJ SOLN
INTRAMUSCULAR | Status: AC
  Filled 2022-03-25: qty 10

## 2022-03-25 MED ORDER — LACTATED RINGERS IV SOLN: INTRAVENOUS | Status: DC

## 2022-03-25 MED ORDER — ROCURONIUM BROMIDE 10 MG/ML (PF) SYRINGE
PREFILLED_SYRINGE | INTRAVENOUS | Status: AC
Start: 2022-03-25 — End: ?
  Filled 2022-03-25: qty 10

## 2022-03-25 MED ORDER — SODIUM CHLORIDE 0.9% FLUSH: 3.0000 mL | INTRAVENOUS | Status: DC | PRN

## 2022-03-25 MED ORDER — METHOCARBAMOL 1000 MG/10ML IJ SOLN
500.0000 mg | Freq: Four times a day (QID) | INTRAVENOUS | Status: DC | PRN
  Filled 2022-03-25: qty 5

## 2022-03-25 MED ORDER — CHLORHEXIDINE GLUCONATE 0.12 % MT SOLN
15.0000 mL | Freq: Once | OROMUCOSAL | Status: AC
  Administered 2022-03-25: 15 mL via OROMUCOSAL
  Filled 2022-03-25: qty 15

## 2022-03-25 MED ORDER — THROMBIN 5000 UNITS EX SOLR: OROMUCOSAL | Status: DC | PRN

## 2022-03-25 MED ORDER — PROPOFOL 10 MG/ML IV BOLUS
INTRAVENOUS | Status: AC
  Filled 2022-03-25: qty 20

## 2022-03-25 MED ORDER — DEXAMETHASONE SODIUM PHOSPHATE 10 MG/ML IJ SOLN
INTRAMUSCULAR | Status: DC | PRN
  Administered 2022-03-25: 10 mg via INTRAVENOUS

## 2022-03-25 MED ORDER — ACETAMINOPHEN 500 MG PO TABS
1000.0000 mg | ORAL_TABLET | ORAL | Status: AC
  Administered 2022-03-25: 1000 mg via ORAL
  Filled 2022-03-25: qty 2

## 2022-03-25 MED ORDER — PROPOFOL 10 MG/ML IV BOLUS
INTRAVENOUS | Status: DC | PRN
  Administered 2022-03-25: 100 mg via INTRAVENOUS

## 2022-03-25 MED ORDER — CELECOXIB 200 MG PO CAPS
200.0000 mg | ORAL_CAPSULE | Freq: Two times a day (BID) | ORAL | Status: DC
  Administered 2022-03-25 – 2022-03-26 (×3): 200 mg via ORAL
  Filled 2022-03-25 (×3): qty 1

## 2022-03-25 MED ORDER — ACETAMINOPHEN 500 MG PO TABS
1000.0000 mg | ORAL_TABLET | Freq: Four times a day (QID) | ORAL | Status: AC
  Administered 2022-03-25 – 2022-03-26 (×4): 1000 mg via ORAL
  Filled 2022-03-25 (×4): qty 2

## 2022-03-25 MED ORDER — GABAPENTIN 300 MG PO CAPS
300.0000 mg | ORAL_CAPSULE | ORAL | Status: AC
  Administered 2022-03-25: 300 mg via ORAL
  Filled 2022-03-25: qty 1

## 2022-03-25 MED ORDER — PHENYLEPHRINE 80 MCG/ML (10ML) SYRINGE FOR IV PUSH (FOR BLOOD PRESSURE SUPPORT)
PREFILLED_SYRINGE | INTRAVENOUS | Status: DC | PRN
  Administered 2022-03-25: 240 ug via INTRAVENOUS
  Administered 2022-03-25 (×2): 160 ug via INTRAVENOUS

## 2022-03-25 MED ORDER — THROMBIN 20000 UNITS EX SOLR
CUTANEOUS | Status: AC
  Filled 2022-03-25: qty 20000

## 2022-03-25 MED ORDER — SUFENTANIL CITRATE 50 MCG/ML IV SOLN
INTRAVENOUS | Status: AC
  Filled 2022-03-25: qty 1

## 2022-03-25 MED ORDER — BUSPIRONE HCL 15 MG PO TABS
15.0000 mg | ORAL_TABLET | Freq: Two times a day (BID) | ORAL | Status: DC
  Administered 2022-03-25 – 2022-03-26 (×2): 15 mg via ORAL
  Filled 2022-03-25 (×2): qty 1

## 2022-03-25 MED ORDER — MIDAZOLAM HCL 2 MG/2ML IJ SOLN
INTRAMUSCULAR | Status: AC
  Filled 2022-03-25: qty 2

## 2022-03-25 MED ORDER — BUPIVACAINE HCL (PF) 0.25 % IJ SOLN
INTRAMUSCULAR | Status: DC | PRN
  Administered 2022-03-25: 6 mL

## 2022-03-25 MED ORDER — PHENYLEPHRINE HCL-NACL 20-0.9 MG/250ML-% IV SOLN
INTRAVENOUS | Status: DC | PRN
  Administered 2022-03-25: 40 ug/min via INTRAVENOUS

## 2022-03-25 MED ORDER — ONDANSETRON HCL 4 MG/2ML IJ SOLN
INTRAMUSCULAR | Status: AC
  Filled 2022-03-25: qty 2

## 2022-03-25 MED ORDER — MENTHOL 3 MG MT LOZG: 1.0000 | LOZENGE | OROMUCOSAL | Status: DC | PRN

## 2022-03-25 MED ORDER — ORAL CARE MOUTH RINSE: 15.0000 mL | Freq: Once | OROMUCOSAL | Status: AC

## 2022-03-25 MED ORDER — ALBUTEROL SULFATE (2.5 MG/3ML) 0.083% IN NEBU: 3.0000 mL | INHALATION_SOLUTION | RESPIRATORY_TRACT | Status: DC | PRN

## 2022-03-25 MED ORDER — OXYCODONE HCL 5 MG PO TABS
10.0000 mg | ORAL_TABLET | ORAL | Status: DC | PRN
  Administered 2022-03-25 – 2022-03-26 (×7): 10 mg via ORAL
  Filled 2022-03-25 (×7): qty 2

## 2022-03-25 MED ORDER — ROCURONIUM BROMIDE 10 MG/ML (PF) SYRINGE
PREFILLED_SYRINGE | INTRAVENOUS | Status: AC
  Filled 2022-03-25: qty 10

## 2022-03-25 SURGICAL SUPPLY — 52 items
ADH SKN CLS APL DERMABOND .7 (GAUZE/BANDAGES/DRESSINGS) ×1
APL SKNCLS STERI-STRIP NONHPOA (GAUZE/BANDAGES/DRESSINGS) ×1
BAG COUNTER SPONGE SURGICOUNT (BAG) ×3 IMPLANT
BAG SPNG CNTER NS LX DISP (BAG) ×2
BENZOIN TINCTURE PRP APPL 2/3 (GAUZE/BANDAGES/DRESSINGS) ×3 IMPLANT
BIT DRILL INVICTUS SUB 2.1 (BIT) ×1 IMPLANT
BUR CARBIDE MATCH 3.0 (BURR) ×1 IMPLANT
CANISTER SUCT 3000ML PPV (MISCELLANEOUS) ×2 IMPLANT
DERMABOND ADVANCED (GAUZE/BANDAGES/DRESSINGS) ×1
DERMABOND ADVANCED .7 DNX12 (GAUZE/BANDAGES/DRESSINGS) IMPLANT
DRAPE C-ARM 42X72 X-RAY (DRAPES) ×4 IMPLANT
DRAPE LAPAROTOMY 100X72 PEDS (DRAPES) ×2 IMPLANT
DRAPE MICROSCOPE LEICA (MISCELLANEOUS) IMPLANT
DRSG OPSITE POSTOP 4X6 (GAUZE/BANDAGES/DRESSINGS) ×1 IMPLANT
DURAPREP 26ML APPLICATOR (WOUND CARE) ×2 IMPLANT
ELECT REM PT RETURN 9FT ADLT (ELECTROSURGICAL) ×2
ELECTRODE REM PT RTRN 9FT ADLT (ELECTROSURGICAL) ×1 IMPLANT
EVACUATOR 1/8 PVC DRAIN (DRAIN) IMPLANT
GLOVE BIO SURGEON STRL SZ7 (GLOVE) ×1 IMPLANT
GLOVE BIO SURGEON STRL SZ8 (GLOVE) ×2 IMPLANT
GLOVE BIOGEL PI IND STRL 7.0 (GLOVE) IMPLANT
GLOVE BIOGEL PI INDICATOR 7.0 (GLOVE) ×1
GOWN STRL REUS W/ TWL LRG LVL3 (GOWN DISPOSABLE) IMPLANT
GOWN STRL REUS W/ TWL XL LVL3 (GOWN DISPOSABLE) ×1 IMPLANT
GOWN STRL REUS W/TWL 2XL LVL3 (GOWN DISPOSABLE) IMPLANT
GOWN STRL REUS W/TWL LRG LVL3 (GOWN DISPOSABLE) ×4
GOWN STRL REUS W/TWL XL LVL3 (GOWN DISPOSABLE) ×8
HEMOSTAT POWDER KIT SURGIFOAM (HEMOSTASIS) ×1 IMPLANT
KIT BASIN OR (CUSTOM PROCEDURE TRAY) ×2 IMPLANT
KIT TURNOVER KIT B (KITS) ×2 IMPLANT
MATRIX SPINE STRIP NEOCORE 5CC (Putty) IMPLANT
NDL SPNL 20GX3.5 QUINCKE YW (NEEDLE) IMPLANT
NEEDLE HYPO 22GX1.5 SAFETY (NEEDLE) ×2 IMPLANT
NEEDLE SPNL 20GX3.5 QUINCKE YW (NEEDLE) ×2 IMPLANT
NS IRRIG 1000ML POUR BTL (IV SOLUTION) ×2 IMPLANT
PACK LAMINECTOMY NEURO (CUSTOM PROCEDURE TRAY) ×2 IMPLANT
PAD ARMBOARD 7.5X6 YLW CONV (MISCELLANEOUS) ×2 IMPLANT
PIN MAYFIELD SKULL DISP (PIN) ×2 IMPLANT
PUTTY DBM 5CC (Putty) ×1 IMPLANT
ROD LORD TI 3.5X55 (Rod) ×2 IMPLANT
SCREW POLYAXIAL 3.5 X 14 (Screw) ×8 IMPLANT
SCREW SET ATEC (Screw) ×8 IMPLANT
SPONGE SURGIFOAM ABS GEL 100 (HEMOSTASIS) ×2 IMPLANT
STRIP CLOSURE SKIN 1/2X4 (GAUZE/BANDAGES/DRESSINGS) ×2 IMPLANT
STRIP MATRIX NEOCORE 5CC (Putty) ×2 IMPLANT
SUT VIC AB 0 CT1 18XCR BRD8 (SUTURE) ×1 IMPLANT
SUT VIC AB 0 CT1 8-18 (SUTURE) ×2
SUT VIC AB 2-0 CP2 18 (SUTURE) ×2 IMPLANT
SUT VIC AB 3-0 SH 8-18 (SUTURE) ×3 IMPLANT
TOWEL GREEN STERILE (TOWEL DISPOSABLE) ×2 IMPLANT
TOWEL GREEN STERILE FF (TOWEL DISPOSABLE) ×2 IMPLANT
WATER STERILE IRR 1000ML POUR (IV SOLUTION) ×2 IMPLANT

## 2022-03-25 NOTE — Anesthesia Procedure Notes (Signed)
Procedure Name: Intubation Date/Time: 03/25/2022 7:47 AM  Performed by: Anastasio Auerbach, CRNAPre-anesthesia Checklist: Patient identified, Emergency Drugs available, Suction available and Patient being monitored Patient Re-evaluated:Patient Re-evaluated prior to induction Oxygen Delivery Method: Circle system utilized Preoxygenation: Pre-oxygenation with 100% oxygen Induction Type: IV induction Ventilation: Mask ventilation without difficulty Laryngoscope Size: Glidescope and 3 Grade View: Grade I Tube type: Oral Tube size: 7.5 mm Number of attempts: 1 Airway Equipment and Method: Stylet and Oral airway Placement Confirmation: ETT inserted through vocal cords under direct vision, positive ETCO2 and breath sounds checked- equal and bilateral Secured at: 21 cm Tube secured with: Tape Dental Injury: Teeth and Oropharynx as per pre-operative assessment
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
## 2022-03-25 NOTE — Transfer of Care (Signed)
Immediate Anesthesia Transfer of Care Note  Patient: [name]  Procedure(s) Performed: Cervical Three-Cervical Seven Posterior Cervical Fusion with Lateral Mass Fixation (Spine Cervical)  Patient Location: PACU  Anesthesia Type:General  Level of Consciousness: awake, alert  and oriented  Airway & Oxygen Therapy: Patient Spontanous Breathing and Patient connected to nasal cannula oxygen  Post-op Assessment: Report given to RN, Post -op Vital signs reviewed and stable and Patient moving all extremities X 4  Post vital signs: Reviewed and stable  Last Vitals:  Vitals Value Taken Time  BP 122/58 03/25/22 0955  Temp    Pulse 86 03/25/22 0956  Resp 9 03/25/22 0956  SpO2 98 % 03/25/22 0956  Vitals shown include unvalidated device data.  Last Pain:  Vitals:   03/25/22 0606  TempSrc:   PainSc: 7       Patients Stated Pain Goal: 3 (20/35/59 7416)  Complications: No notable events documented.

## 2022-03-25 NOTE — Anesthesia Postprocedure Evaluation (Signed)
Anesthesia Post Note  Patient: [name]  Procedure(s) Performed: Cervical Three-Cervical Seven Posterior Cervical Fusion with Lateral Mass Fixation (Spine Cervical)     Patient location during evaluation: PACU Anesthesia Type: General Level of consciousness: awake and alert Pain management: pain level controlled Vital Signs Assessment: post-procedure vital signs reviewed and stable Respiratory status: spontaneous breathing, nonlabored ventilation, respiratory function stable and patient connected to nasal cannula oxygen Cardiovascular status: blood pressure returned to baseline and stable Postop Assessment: no apparent nausea or vomiting Anesthetic complications: no   No notable events documented.  Last Vitals:  Vitals:   03/25/22 1055 03/25/22 1128  BP: 124/62 (!) 143/58  Pulse: 86 97  Resp: 13 18  Temp: (!) 36.2 C 36.6 C  SpO2: 90% 95%    Last Pain:  Vitals:   03/25/22 1200  TempSrc:   PainSc: 8                  Curvin Hunger L Catarina Huntley
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
## 2022-03-26 MED ORDER — OXYCODONE HCL 15 MG PO TABS: 15.0000 mg | ORAL_TABLET | Freq: Four times a day (QID) | ORAL | 0 refills | Status: DC | PRN

## 2022-03-26 MED ORDER — CELECOXIB 200 MG PO CAPS: 200.0000 mg | ORAL_CAPSULE | Freq: Two times a day (BID) | ORAL | 0 refills | Status: DC

## 2022-03-26 MED ORDER — GABAPENTIN 100 MG PO CAPS: 100.0000 mg | ORAL_CAPSULE | Freq: Three times a day (TID) | ORAL | 0 refills | Status: DC

## 2022-03-26 MED ORDER — TIZANIDINE HCL 4 MG PO TABS: 4.0000 mg | ORAL_TABLET | Freq: Four times a day (QID) | ORAL | 0 refills | Status: DC | PRN

## 2022-03-26 NOTE — Progress Notes (Signed)
Patient is discharged from room 3C11 at this time. Alert and in stable condition. IV site d/c's and instructions read to patient and daughter with understanding verbalized and all questions answered. Left unit via wheelchair with all belongings at side.
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
## 2022-03-26 NOTE — Discharge Instructions (Signed)
Wound Care Keep incision area dry.  You may remove outer bandage after three days   If you shower prior cover incision with plastic wrap.  Do not put any creams, lotions, or ointments on incision. Leave steri-strips on neck.  They will fall off by themselves. Activity Walk each and every day, increasing distance each day. No lifting greater than 5 lbs.  Avoid excessive neck motion. No driving for 2 weeks; may ride as a passenger locally. Wear neck brace as needed except when showering. Diet Resume your normal diet.   Call Your Doctor If Any of These Occur Redness, drainage, or swelling at the wound.  Temperature greater than 101 degrees. Severe pain not relieved by pain medication. Increased difficulty swallowing.  Incision starts to come apart. Follow Up Appt Call  [phone])  for problems.  If you have any hardware placed in your spine, you will need an x-ray before your appointment.
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
## 2022-03-26 NOTE — Progress Notes (Signed)
Orthopedic Tech Progress Note Patient Details:  [name] Mar 11, 1961 [id number]  Ortho Devices Type of Ortho Device: Soft collar Ortho Device/Splint Location: Neck Ortho Device/Splint Interventions: Application   Post Interventions Patient Tolerated: Well  Quintina Hakeem E Iysis Germain 03/26/2022, 8:11 AM

## 2022-03-28 ENCOUNTER — Telehealth: Payer: Self-pay

## 2022-03-28 NOTE — Telephone Encounter (Signed)
Transition Care Management Follow-up Telephone Call Date of discharge and from where: [name] 03/25/22-03/26/22 How have you been since you were released from the hospital? "I am hurting, my neck muscles are very tight.  I called the surgeons office and the nurse told me I could use some heat and be sure to take my muscle relaxer". Any questions or concerns? No  Items Reviewed: Did the pt receive and understand the discharge instructions provided? Yes  Medications obtained and verified? Yes  Other? No  Any new allergies since your discharge? No  Dietary orders reviewed? Yes Do you have support at home? Yes   Home Care and Equipment/Supplies: Were home health services ordered? no If so, what is the name of the agency? N/A  Has the agency set up a time to come to the patient's home? not applicable Were any new equipment or medical supplies ordered?  No What is the name of the medical supply agency? N/A Were you able to get the supplies/equipment? not applicable Do you have any questions related to the use of the equipment or supplies? No  Functional Questionnaire: (I = Independent and D = Dependent) ADLs: I  Bathing/Dressing- I (daughter assists some)  Meal Prep- I  Eating- I  Maintaining continence- I  Transferring/Ambulation- I  Managing Meds- I  Follow up appointments reviewed:  PCP Hospital f/u appt confirmed? No   Specialist Hospital f/u appt confirmed? Yes  Scheduled to see Dr. Naaman Plummer on 05/18/2022 @ 3:20. Are transportation arrangements needed? No  If their condition worsens, is the pt aware to call PCP or go to the Emergency Dept.? Yes Was the patient provided with contact information for the PCP's office or ED? Yes Was to pt encouraged to call back with questions or concerns? Yes Johnney Killian, RN, BSN, CCM Care Management Coordinator [facility] Phone: [phone]: [phone]

## 2022-04-13 ENCOUNTER — Other Ambulatory Visit: Payer: Self-pay | Admitting: Family Medicine

## 2022-05-07 ENCOUNTER — Other Ambulatory Visit: Payer: Self-pay | Admitting: Family Medicine

## 2022-05-18 ENCOUNTER — Encounter: Payer: Self-pay | Admitting: Physical Medicine & Rehabilitation

## 2022-05-18 ENCOUNTER — Encounter: Payer: 59 | Attending: Registered Nurse | Admitting: Physical Medicine & Rehabilitation

## 2022-05-18 DIAGNOSIS — M76892 Other specified enthesopathies of left lower limb, excluding foot: Secondary | ICD-10-CM | POA: Insufficient documentation

## 2022-05-18 DIAGNOSIS — M961 Postlaminectomy syndrome, not elsewhere classified: Secondary | ICD-10-CM | POA: Diagnosis present

## 2022-05-18 MED ORDER — OXYCODONE HCL 10 MG PO TABS: 10.0000 mg | ORAL_TABLET | Freq: Two times a day (BID) | ORAL | 0 refills | Status: DC | PRN

## 2022-05-18 MED ORDER — GABAPENTIN 300 MG PO CAPS: 300.0000 mg | ORAL_CAPSULE | Freq: Three times a day (TID) | ORAL | 3 refills | Status: DC

## 2022-05-18 NOTE — Patient Instructions (Signed)
PLEASE FEEL FREE TO CALL OUR OFFICE WITH ANY PROBLEMS OR QUESTIONS ([phone])      
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
## 2022-05-19 ENCOUNTER — Telehealth: Payer: Self-pay | Admitting: *Deleted

## 2022-05-19 NOTE — Telephone Encounter (Signed)
PA Case: [id number], Status: Closed, Closed Reason Code: BY Other - not covered by other values, Closed Rationale: [employer] [facility] Prior Authorization team is withdrawing this request for oxycodone 10 mg tablets as a one-time override has been entered to allow your patient to fill this medication for a 30 day supply. Please have the dispensing pharmacy reprocess the claim.. Questions? Contact [phone].  Pharmacy notified.

## 2022-05-19 NOTE — Telephone Encounter (Signed)
Prior auth initiated with [employer] [facility] (Friday Plan) via CoverMyMeds.

## 2022-05-29 ENCOUNTER — Other Ambulatory Visit: Payer: Self-pay | Admitting: Family Medicine

## 2022-05-31 ENCOUNTER — Other Ambulatory Visit: Payer: Self-pay | Admitting: Family Medicine

## 2022-06-14 ENCOUNTER — Telehealth: Payer: Self-pay

## 2022-06-14 DIAGNOSIS — M961 Postlaminectomy syndrome, not elsewhere classified: Secondary | ICD-10-CM

## 2022-06-14 DIAGNOSIS — M76892 Other specified enthesopathies of left lower limb, excluding foot: Secondary | ICD-10-CM

## 2022-06-14 MED ORDER — GABAPENTIN 100 MG PO CAPS: 100.0000 mg | ORAL_CAPSULE | Freq: Three times a day (TID) | ORAL | 3 refills | Status: DC

## 2022-06-14 MED ORDER — OXYCODONE HCL 10 MG PO TABS: 10.0000 mg | ORAL_TABLET | Freq: Three times a day (TID) | ORAL | 0 refills | Status: DC | PRN

## 2022-06-14 NOTE — Telephone Encounter (Signed)
Patient called and stated she is only taking Gabapentin once a day because it makes her really sleepy. She stated she was doing better with 100 mg TID. She also stated she was taking Oxycodone more than twice a day and she will be out tomorrow. Informed patient that she cannot take more than prescribed because it is against the contract and she will not be able to get a refill until 30 days later. Per PMP, Oxycodone was filled 05/19/22. [facility] wants [facility]

## 2022-06-14 NOTE — Addendum Note (Signed)
Addended by: Alger Simons T on: 06/14/2022 02:31 PM   Modules accepted: Orders

## 2022-06-14 NOTE — Telephone Encounter (Signed)
I gave her enough oxycodone to take '10mg'$  three x daily if needed. I reduced gabapentin to '300mg'$  tid.   I would like a urine drug screen performed at her next OV

## 2022-06-14 NOTE — Telephone Encounter (Signed)
Left voicemail to inform patient that [facility]

## 2022-06-27 ENCOUNTER — Other Ambulatory Visit: Payer: Self-pay | Admitting: Family Medicine

## 2022-07-10 ENCOUNTER — Other Ambulatory Visit: Payer: Self-pay | Admitting: Family Medicine

## 2022-07-11 ENCOUNTER — Telehealth: Payer: Self-pay

## 2022-07-11 DIAGNOSIS — M961 Postlaminectomy syndrome, not elsewhere classified: Secondary | ICD-10-CM

## 2022-07-11 DIAGNOSIS — M76892 Other specified enthesopathies of left lower limb, excluding foot: Secondary | ICD-10-CM

## 2022-07-11 MED ORDER — OXYCODONE HCL 10 MG PO TABS: 10.0000 mg | ORAL_TABLET | Freq: Three times a day (TID) | ORAL | 0 refills | Status: DC | PRN

## 2022-07-11 NOTE — Telephone Encounter (Signed)
Rx written and sent to the pharmacy. Thanks!  

## 2022-07-11 NOTE — Addendum Note (Signed)
Addended by: Alger Simons T on: 07/11/2022 11:41 AM   Modules accepted: Orders

## 2022-07-11 NOTE — Telephone Encounter (Signed)
Patient is calling to request refill for Oxycodone. Per PMP, last refill was 06/14/22

## 2022-07-13 ENCOUNTER — Telehealth: Payer: Self-pay

## 2022-07-13 NOTE — Telephone Encounter (Signed)
Approvedon October 3 CaseId:[id number];Status:Approved;Review Type:Prior Auth;Coverage Start Date:07/12/2022;Coverage End Date:07/12/2023;  PA for Oxycodone 10 MG approved

## 2022-07-20 ENCOUNTER — Ambulatory Visit (INDEPENDENT_AMBULATORY_CARE_PROVIDER_SITE_OTHER): Payer: Commercial Managed Care - HMO | Admitting: Family Medicine

## 2022-07-20 VITALS — BP 120/70 | HR 80 | Temp 97.8°F | Ht 67.0 in | Wt 135.6 lb

## 2022-07-20 DIAGNOSIS — R519 Headache, unspecified: Secondary | ICD-10-CM | POA: Diagnosis not present

## 2022-07-20 DIAGNOSIS — J441 Chronic obstructive pulmonary disease with (acute) exacerbation: Secondary | ICD-10-CM | POA: Diagnosis not present

## 2022-07-20 DIAGNOSIS — R197 Diarrhea, unspecified: Secondary | ICD-10-CM | POA: Diagnosis not present

## 2022-07-20 LAB — POC COVID19 BINAXNOW: SARS Coronavirus 2 Ag: NEGATIVE

## 2022-07-20 MED ORDER — METHYLPREDNISOLONE ACETATE 40 MG/ML IJ SUSP
40.0000 mg | Freq: Once | INTRAMUSCULAR | Status: AC
  Administered 2022-07-20: 40 mg via INTRAMUSCULAR

## 2022-07-20 MED ORDER — AMOXICILLIN-POT CLAVULANATE 875-125 MG PO TABS: 1.0000 | ORAL_TABLET | Freq: Two times a day (BID) | ORAL | 0 refills | Status: DC

## 2022-07-20 NOTE — Addendum Note (Signed)
Addended by: Nilda Riggs on: 07/20/2022 04:58 PM   Modules accepted: Orders

## 2022-07-20 NOTE — Progress Notes (Signed)
Established Patient Office Visit  Subjective   Patient ID: [name], female    DOB: [birth year]  Age: 61 y.o. MRN: [id number]  Chief Complaint  Patient presents with   Diarrhea    Patient complains of diarrhea, x4 days,    Nasal Congestion    Patient complains of nasal congestion, x4 days    Shortness of Breath    Patient complains of shortness of breath, x4 days, Patient reports no chest tightness or pain    HPI   [name] is seen with 3-week history of illness.  She has longstanding history of nicotine use and still smokes.  She started with some cough and dyspnea wheezing about 3 weeks ago.  Now cough is productive of greenish sputum.  No documented fever.  No hemoptysis.  She has home oxygen which she uses as needed.  Had some ongoing nasal congestion.  She had some mild nonbloody diarrhea for few days.  No nausea or vomiting.  No chest pain.  She has home nebulizer and using that intermittently.  She had some leftover Zithromax and actually started that yesterday.  Past Medical History:  Diagnosis Date   Arthritis    cervical spine   ASTHMA UNSPECIFIED WITH EXACERBATION 07/21/2010   CARPAL TUNNEL SYNDROME, BILATERAL 01/16/2008   COPD 05/08/2008   Dyspnea    intermittent. worsens with anxiety   GERD 40/98/1191   HELICOBACTER PYLORI GASTRITIS, HX OF 04/18/2007   PANIC DISORDER 04/18/2007   TOBACCO ABUSE 07/17/2009   Past Surgical History:  Procedure Laterality Date   ANTERIOR CERVICAL DECOMP/DISCECTOMY FUSION N/A 11/19/2021   Procedure: CERVICAL FOUR-FIVE, CERVICAL FIVE-SIX, CERVICAL SIX-SEVEN ANTERIOR CERVICAL DECOMPRESSION/DISCECTOMY FUSION;  Surgeon: Eustace Moore, MD;  Location: [place];  Service: Neurosurgery;  Laterality: N/A;   APPENDECTOMY     OOPHORECTOMY     POSTERIOR CERVICAL FUSION/FORAMINOTOMY N/A 03/25/2022   Procedure: Cervical Three-Cervical Seven Posterior Cervical Fusion with Lateral Mass Fixation;  Surgeon: Eustace Moore, MD;  Location: [place];   Service: Neurosurgery;  Laterality: N/A;    reports that she has been smoking cigarettes. She has a 25.00 pack-year smoking history. She has never used smokeless tobacco. She reports that she does not drink alcohol and does not use drugs. family history includes Breast cancer in her paternal aunt; Cancer in her father; Clotting disorder in her brother; Diabetes in her mother; Heart disease in her father; Stomach cancer in her paternal uncle. Allergies  Allergen Reactions   Aspirin Other (See Comments)    REACTION: had h. pylori due to too many goody powders. told not to take any more asa    Review of Systems  Constitutional:  Negative for chills and fever.  HENT:  Negative for sore throat.   Respiratory:  Positive for cough, sputum production, shortness of breath and wheezing. Negative for hemoptysis.   Cardiovascular:  Negative for chest pain.  Gastrointestinal:  Negative for abdominal pain.      Objective:     BP 120/70 (BP Location: Left Arm, Patient Position: Sitting, Cuff Size: Normal)   Pulse 80   Temp 97.8 F (36.6 C) (Oral)   Ht '5\' 7"'$  (1.702 m)   Wt 135 lb 9.6 oz (61.5 kg)   SpO2 91%   BMI 21.24 kg/m  BP Readings from Last 3 Encounters:  07/20/22 120/70  05/18/22 121/81  03/26/22 109/62   Wt Readings from Last 3 Encounters:  07/20/22 135 lb 9.6 oz (61.5 kg)  05/18/22 132 lb 12.8 oz (60.2  kg)  03/25/22 129 lb (58.5 kg)      Physical Exam Vitals reviewed.  Constitutional:      General: She is not in acute distress.    Appearance: She is well-developed.  Cardiovascular:     Rate and Rhythm: Normal rate and regular rhythm.  Pulmonary:     Comments: She has somewhat diminished breath sounds throughout.  No increased work of breathing at rest.  Diffuse wheezes.  No localizing rales. Musculoskeletal:     Right lower leg: No edema.     Left lower leg: No edema.  Neurological:     Mental Status: She is alert.      Results for orders placed or performed in  visit on 07/20/22  POC COVID-19  Result Value Ref Range   SARS Coronavirus 2 Ag Negative Negative      The 10-year ASCVD risk score (Arnett DK, et al., 2019) is: 6.8%    Assessment & Plan:   Patient presents with 3-week history of progressive cough.  Acute exacerbation of COPD.  She has longstanding history of nicotine use and underlying COPD-moderate obstruction by pulmonary function testing 2019..  O2 sats low 90s by home readings and here.  -Start Augmentin 875 mg twice daily for 7 days -Continue Zithromax 250 mg once daily for 4 more days -Depo-Medrol 120 mg IM -Continue home oxygen especially for sats low 90s or below -Consider over-the-counter Mucinex 1200 mg twice daily -Continue albuterol by home nebulizer every 4 hours as needed -She knows to go to the hospital/ER for O2 sats dipping below 90% or increased work of breathing   No follow-ups on file.    Carolann Littler, MD

## 2022-07-20 NOTE — Patient Instructions (Signed)
Consider Mucinex 1200 mg twice daily  Monitor O2 sats and stay on oxygen for low 90s  Start the Augmentin tonight  Continue the Zithromax one daily for 4 more days.

## 2022-07-24 ENCOUNTER — Other Ambulatory Visit: Payer: Self-pay | Admitting: Family Medicine

## 2022-08-09 ENCOUNTER — Telehealth: Payer: Self-pay | Admitting: Physical Medicine & Rehabilitation

## 2022-08-09 DIAGNOSIS — M76892 Other specified enthesopathies of left lower limb, excluding foot: Secondary | ICD-10-CM

## 2022-08-09 DIAGNOSIS — M961 Postlaminectomy syndrome, not elsewhere classified: Secondary | ICD-10-CM

## 2022-08-09 MED ORDER — OXYCODONE HCL 10 MG PO TABS: 10.0000 mg | ORAL_TABLET | Freq: Three times a day (TID) | ORAL | 0 refills | Status: DC | PRN

## 2022-08-09 NOTE — Telephone Encounter (Signed)
Called patient to cancel appt. Due to provider being out of the office tomorrow. She is due for her Oxycodone refill and would like to get a prescription for refill

## 2022-08-09 NOTE — Telephone Encounter (Signed)
PMP was Reviewed.  Dr Naaman Plummer note as reviewed.  Oxycodone e-scribed today.

## 2022-08-10 ENCOUNTER — Encounter: Payer: Commercial Managed Care - HMO | Admitting: Physical Medicine & Rehabilitation

## 2022-08-20 ENCOUNTER — Other Ambulatory Visit: Payer: Self-pay | Admitting: Family Medicine

## 2022-09-04 ENCOUNTER — Other Ambulatory Visit: Payer: Self-pay | Admitting: Family Medicine

## 2022-09-04 DIAGNOSIS — M76892 Other specified enthesopathies of left lower limb, excluding foot: Secondary | ICD-10-CM

## 2022-09-04 DIAGNOSIS — M961 Postlaminectomy syndrome, not elsewhere classified: Secondary | ICD-10-CM

## 2022-09-05 ENCOUNTER — Other Ambulatory Visit: Payer: Self-pay | Admitting: Family Medicine

## 2022-09-05 MED ORDER — OXYCODONE HCL 10 MG PO TABS: 10.0000 mg | ORAL_TABLET | Freq: Three times a day (TID) | ORAL | 0 refills | Status: DC | PRN

## 2022-09-05 NOTE — Telephone Encounter (Signed)
  Oxycodone refill request. [facility] [place].   Filled  Written  ID  Drug  QTY  Days  Prescriber  RX #  Dispenser  Refill  Daily Dose*  Pymt Type  PMP  08/20/2022 06/14/2022 1  Gabapentin 100 Mg Capsule 90.00 30 Za Swa [id number] Nor (3235) 2/3  Comm Ins [place] 08/09/2022 08/09/2022 1  Oxycodone Hcl (Ir) 10 Mg Tab 90.00 30 Eu Tho [id number] Nor (3235) 0/0 45.00 MME Comm Ins [place] 07/13/2022 07/11/2022 1  Oxycodone Hcl (Ir) 10 Mg Tab 90.00 30 Za Swa [id number] [facility] (8151) 0/0 45.00 MME Comm Ins [place]

## 2022-09-18 ENCOUNTER — Other Ambulatory Visit: Payer: Self-pay | Admitting: Family Medicine

## 2022-09-18 DIAGNOSIS — J441 Chronic obstructive pulmonary disease with (acute) exacerbation: Secondary | ICD-10-CM

## 2022-10-04 ENCOUNTER — Telehealth: Payer: Self-pay | Admitting: Physical Medicine & Rehabilitation

## 2022-10-04 ENCOUNTER — Telehealth: Payer: Self-pay

## 2022-10-04 DIAGNOSIS — M76892 Other specified enthesopathies of left lower limb, excluding foot: Secondary | ICD-10-CM

## 2022-10-04 DIAGNOSIS — M961 Postlaminectomy syndrome, not elsewhere classified: Secondary | ICD-10-CM

## 2022-10-04 NOTE — Telephone Encounter (Signed)
Noted  
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
## 2022-10-04 NOTE — Telephone Encounter (Signed)
Patient asking for refill on oxycodone

## 2022-10-04 NOTE — Telephone Encounter (Signed)
Pt called, returning RN's call. Pt she said just let RN and CMA know she is feeling a little better, but if she gets worse she will call for an appt.

## 2022-10-05 ENCOUNTER — Encounter: Payer: Self-pay | Admitting: Family Medicine

## 2022-10-05 ENCOUNTER — Telehealth (INDEPENDENT_AMBULATORY_CARE_PROVIDER_SITE_OTHER): Payer: Commercial Managed Care - HMO | Admitting: Family Medicine

## 2022-10-05 VITALS — HR 86 | Ht 67.0 in | Wt 135.6 lb

## 2022-10-05 DIAGNOSIS — J441 Chronic obstructive pulmonary disease with (acute) exacerbation: Secondary | ICD-10-CM

## 2022-10-05 MED ORDER — CEFDINIR 300 MG PO CAPS: 300.0000 mg | ORAL_CAPSULE | Freq: Two times a day (BID) | ORAL | 0 refills | Status: DC

## 2022-10-05 MED ORDER — OXYCODONE HCL 10 MG PO TABS: 10.0000 mg | ORAL_TABLET | Freq: Three times a day (TID) | ORAL | 0 refills | Status: DC | PRN

## 2022-10-05 NOTE — Progress Notes (Signed)
Patient ID: [name], female   DOB: 05-10-1961, 61 y.o.   MRN: [id number]   Virtual Visit via Video Note  I connected with [name] on 10/05/22 at  5:00 PM EST by a video enabled telemedicine application and verified that I am speaking with the correct person using two identifiers.  Location patient: home Location provider:work or home office Persons participating in the virtual visit: patient, provider  I discussed the limitations of evaluation and management by telemedicine and the availability of in person appointments. The patient expressed understanding and agreed to proceed.   HPI: [name] has history of COPD and unfortunately recently resumed smoking after quitting.  Last Wednesday she developed some nasal congestion which has now settled into her chest.  She has had some headaches and fatigue.  Pulse oximetry currently 95%.  She has home nebulizer which seems to help some.  She is not aware of any active wheezing.  No hemoptysis.  No chest pain.  Feels like she is getting worse instead of better.  She does state that she had a granddaughter that had respiratory symptoms last week that she was around and she thinks she may have picked up something from her.  She has not done any COVID testing.   ROS: See pertinent positives and negatives per HPI.  Past Medical History:  Diagnosis Date   Arthritis    cervical spine   ASTHMA UNSPECIFIED WITH EXACERBATION 07/21/2010   CARPAL TUNNEL SYNDROME, BILATERAL 01/16/2008   COPD 05/08/2008   Dyspnea    intermittent. worsens with anxiety   GERD 57/32/2025   HELICOBACTER PYLORI GASTRITIS, HX OF 04/18/2007   PANIC DISORDER 04/18/2007   TOBACCO ABUSE 07/17/2009    Past Surgical History:  Procedure Laterality Date   ANTERIOR CERVICAL DECOMP/DISCECTOMY FUSION N/A 11/19/2021   Procedure: CERVICAL FOUR-FIVE, CERVICAL FIVE-SIX, CERVICAL SIX-SEVEN ANTERIOR CERVICAL DECOMPRESSION/DISCECTOMY FUSION;  Surgeon: Eustace Moore, MD;  Location:  [place];  Service: Neurosurgery;  Laterality: N/A;   APPENDECTOMY     OOPHORECTOMY     POSTERIOR CERVICAL FUSION/FORAMINOTOMY N/A 03/25/2022   Procedure: Cervical Three-Cervical Seven Posterior Cervical Fusion with Lateral Mass Fixation;  Surgeon: Eustace Moore, MD;  Location: [place];  Service: Neurosurgery;  Laterality: N/A;    Family History  Problem Relation Age of Onset   Diabetes Mother    Cancer Father        lung smoke    Heart disease Father    Clotting disorder Brother    Breast cancer Paternal Aunt    Stomach cancer Paternal Uncle    Esophageal cancer Neg Hx    Colon cancer Neg Hx     SOCIAL HX: Ongoing nicotine use   Current Outpatient Medications:    albuterol (PROVENTIL) (2.5 MG/3ML) 0.083% nebulizer solution, USE 2.5 MG PER NEBULIZER EVERY 6 HOURS AS NEEDED FOR COUGH/WHEEZE, Disp: 150 mL, Rfl: 1   albuterol (VENTOLIN HFA) 108 (90 Base) MCG/ACT inhaler, Inhale 2 puffs into the lungs every 4 (four) hours as needed for wheezing or shortness of breath., Disp: 8.5 each, Rfl: 1   busPIRone (BUSPAR) 15 MG tablet, Take 1/2 tablet by mouth twice daily for 7 days then increase to 1 full tablet by mouth twice daily, Disp: 60 tablet, Rfl: 2   cefdinir (OMNICEF) 300 MG capsule, Take 1 capsule (300 mg total) by mouth 2 (two) times daily., Disp: 14 capsule, Rfl: 0   celecoxib (CELEBREX) 200 MG capsule, Take 1 capsule (200 mg total) by mouth 2 (two)  times daily., Disp: 30 capsule, Rfl: 0   gabapentin (NEURONTIN) 100 MG capsule, Take 1 capsule (100 mg total) by mouth 3 (three) times daily., Disp: 90 capsule, Rfl: 3   omeprazole (PRILOSEC) 20 MG capsule, TAKE 1 CAPSULE BY MOUTH EVERY DAY, Disp: 90 capsule, Rfl: 0   Oxycodone HCl 10 MG TABS, Take 1 tablet (10 mg total) by mouth every 8 (eight) hours as needed., Disp: 90 tablet, Rfl: 0   tiZANidine (ZANAFLEX) 4 MG tablet, Take 1 tablet (4 mg total) by mouth every 6 (six) hours as needed for muscle spasms., Disp: 60 tablet, Rfl: 0   tiZANidine  (ZANAFLEX) 4 MG tablet, Take by mouth., Disp: , Rfl:   EXAM:  VITALS per patient if applicable:  GENERAL: alert, oriented, appears well and in no acute distress  HEENT: atraumatic, conjunttiva clear, no obvious abnormalities on inspection of external nose and ears  NECK: normal movements of the head and neck  LUNGS: on inspection no signs of respiratory distress, breathing rate appears normal, no obvious gross SOB, gasping or wheezing  CV: no obvious cyanosis  MS: moves all visible extremities without noticeable abnormality  PSYCH/NEURO: pleasant and cooperative, no obvious depression or anxiety, speech and thought processing grossly intact  ASSESSMENT AND PLAN:  Discussed the following assessment and plan:  Acute exacerbation of COPD.  Patient is in no respiratory distress currently.  O2 sats 95%.  Given her COPD history we decided to go ahead and cover with Omnicef 300 mg twice daily for 7 days given her increased productive cough and mild shortness of breath.  Continue nebulizer as needed.  We did discuss possible need for steroids but at this point she feels like she is not actively wheezing.  She knows to follow-up immediately for any fever, O2 sats less than 90%, or increased dyspnea.     I discussed the assessment and treatment plan with the patient. The patient was provided an opportunity to ask questions and all were answered. The patient agreed with the plan and demonstrated an understanding of the instructions.   The patient was advised to call back or seek an in-person evaluation if the symptoms worsen or if the condition fails to improve as anticipated.     Carolann Littler, MD

## 2022-10-05 NOTE — Telephone Encounter (Signed)
PMP was Reviewed. Dr Naaman Plummer note was reviewed,, she has a scheduled appointment with Dr Naaman Plummer next month.  Oxycodone e-scribed today.  Call placed to Ms. Hosick, she verbalizes understanding.

## 2022-10-06 ENCOUNTER — Telehealth: Payer: Self-pay | Admitting: Family Medicine

## 2022-10-06 DIAGNOSIS — J441 Chronic obstructive pulmonary disease with (acute) exacerbation: Secondary | ICD-10-CM

## 2022-10-06 NOTE — Telephone Encounter (Signed)
Pt was seen yesterday via VV.   Pt is calling to request her oxygen.  [place]  Fax:  [phone]

## 2022-10-07 NOTE — Telephone Encounter (Signed)
Order faxed.

## 2022-10-07 NOTE — Telephone Encounter (Signed)
Okay to order? If so, how many liters per min?

## 2022-10-14 ENCOUNTER — Other Ambulatory Visit: Payer: Self-pay | Admitting: Family Medicine

## 2022-10-18 ENCOUNTER — Other Ambulatory Visit: Payer: Self-pay | Admitting: Family Medicine

## 2022-10-26 ENCOUNTER — Telehealth: Payer: Self-pay | Admitting: Family Medicine

## 2022-10-26 NOTE — Telephone Encounter (Signed)
error 

## 2022-10-26 NOTE — Telephone Encounter (Signed)
Received the updated order for oxygen.  Requesting clinical notes and updated testing. Pls fax to [phone]

## 2022-10-31 NOTE — Telephone Encounter (Signed)
Notes faxed.

## 2022-11-02 ENCOUNTER — Encounter
Payer: Commercial Managed Care - HMO | Attending: Physical Medicine & Rehabilitation | Admitting: Physical Medicine & Rehabilitation

## 2022-11-02 ENCOUNTER — Encounter: Payer: Self-pay | Admitting: Physical Medicine & Rehabilitation

## 2022-11-02 VITALS — BP 149/83 | HR 111 | Ht 67.0 in | Wt 130.0 lb

## 2022-11-02 DIAGNOSIS — G894 Chronic pain syndrome: Secondary | ICD-10-CM | POA: Diagnosis present

## 2022-11-02 DIAGNOSIS — M961 Postlaminectomy syndrome, not elsewhere classified: Secondary | ICD-10-CM | POA: Diagnosis not present

## 2022-11-02 DIAGNOSIS — F411 Generalized anxiety disorder: Secondary | ICD-10-CM | POA: Diagnosis present

## 2022-11-02 DIAGNOSIS — Z5181 Encounter for therapeutic drug level monitoring: Secondary | ICD-10-CM

## 2022-11-02 DIAGNOSIS — M76892 Other specified enthesopathies of left lower limb, excluding foot: Secondary | ICD-10-CM | POA: Diagnosis present

## 2022-11-02 DIAGNOSIS — Z79899 Other long term (current) drug therapy: Secondary | ICD-10-CM | POA: Insufficient documentation

## 2022-11-02 MED ORDER — CYCLOBENZAPRINE HCL 5 MG PO TABS: 5.0000 mg | ORAL_TABLET | Freq: Three times a day (TID) | ORAL | 1 refills | Status: DC | PRN

## 2022-11-02 MED ORDER — ESCITALOPRAM OXALATE 10 MG PO TABS: 5.0000 mg | ORAL_TABLET | Freq: Every day | ORAL | 2 refills | Status: DC

## 2022-11-02 MED ORDER — OXYCODONE HCL 10 MG PO TABS: 10.0000 mg | ORAL_TABLET | Freq: Three times a day (TID) | ORAL | 0 refills | Status: DC | PRN

## 2022-11-02 NOTE — Patient Instructions (Signed)
GABAPENTIN: TAKE '100MG'$  AT NIGHT FOR 2 DAYS THEN TWICE DAILY FOR 2 DAYS THEN UP TO 3 X DAILY.  IF YOU START TO FEEL DIZZY BECASE OF IT, GIVE ME A CALL.   FLEXERIL '5MG'$  EVERY 8 HOURS AS NEEDED   LEXAPRO '5MG'$  AT BEDTIME    WORK ON STRESS RELIEF, LEISURE ACTIVITIES, THINGS TO HELP GET YOUR MIND OFF PAIN.

## 2022-11-02 NOTE — Progress Notes (Signed)
Subjective:    Patient ID: [name], female    DOB: 01-Jul-1961, 62 y.o.   MRN: [id number]  HPI  [name] is here in follow up of her chronic neck pain. She has had continued neck pain which has worsened over the last week when she stopped her gabapentin which she stopped because she felt it was causing her to be dizzy.. The pain is in her low neck above her shoulder blades. She feels that it's in knots. She stopped taking gabapentin at the same time. Additionally she's stopped her lexapro at some point along with buspar presribed by her primary.   She remains on oxycodone 10 mg every 8 hours as needed for pain.  He really is not touching her pain anymore unfortunately.  She also takes tizanidine 4 mg 3 times a day.  She is unsure if this is a medication that might make her feel dizzy as well.  Last year she attempted to contact any of her providers regarding these medication changes and she told me that she did not know who to call.  She has been trying to get into see her surgeon but apparently ran into some barriers regarding payment owed.  Pain Inventory Average Pain 10 Pain Right Now 10 My pain is sharp, burning, dull, stabbing, and aching  In the last 24 hours, has pain interfered with the following? General activity 10 Relation with others 8 Enjoyment of life 10 What TIME of day is your pain at its worst? morning , daytime, evening, and night Sleep (in general) Poor  Pain is worse with: unsure Pain improves with: medication Relief from Meds: 7  Family History  Problem Relation Age of Onset   Diabetes Mother    Cancer Father        lung smoke    Heart disease Father    Clotting disorder Brother    Breast cancer Paternal Aunt    Stomach cancer Paternal Uncle    Esophageal cancer Neg Hx    Colon cancer Neg Hx    Social History   Socioeconomic History   Marital status: Divorced    Spouse name: Not on file   Number of children: 1   Years of education: Not on  file   Highest education level: Not on file  Occupational History   Occupation: Forensic psychologist: SELF-EMPLOYED  Tobacco Use   Smoking status: Every Day    Packs/day: 0.50    Years: 50.00    Total pack years: 25.00    Types: Cigarettes   Smokeless tobacco: Never   Tobacco comments:    Trying nicotine patches, made her nauseous at first, going to cut patches in half.  Vaping Use   Vaping Use: Never used  Substance and Sexual Activity   Alcohol use: No    Comment: occasional   Drug use: No   Sexual activity: Not on file  Other Topics Concern   Not on file  Social History Narrative   lives with bf    cna cares of elderly person   Self employed    Long term smoker   Social Determinants of Health   Financial Resource Strain: Not on file  Food Insecurity: Not on file  Transportation Needs: Not on file  Physical Activity: Not on file  Stress: Not on file  Social Connections: Not on file   Past Surgical History:  Procedure Laterality Date   ANTERIOR CERVICAL DECOMP/DISCECTOMY FUSION N/A 11/19/2021   Procedure:  CERVICAL FOUR-FIVE, CERVICAL FIVE-SIX, CERVICAL SIX-SEVEN ANTERIOR CERVICAL DECOMPRESSION/DISCECTOMY FUSION;  Surgeon: Eustace Moore, MD;  Location: [place];  Service: Neurosurgery;  Laterality: N/A;   APPENDECTOMY     OOPHORECTOMY     POSTERIOR CERVICAL FUSION/FORAMINOTOMY N/A 03/25/2022   Procedure: Cervical Three-Cervical Seven Posterior Cervical Fusion with Lateral Mass Fixation;  Surgeon: Eustace Moore, MD;  Location: [place];  Service: Neurosurgery;  Laterality: N/A;   Past Surgical History:  Procedure Laterality Date   ANTERIOR CERVICAL DECOMP/DISCECTOMY FUSION N/A 11/19/2021   Procedure: CERVICAL FOUR-FIVE, CERVICAL FIVE-SIX, CERVICAL SIX-SEVEN ANTERIOR CERVICAL DECOMPRESSION/DISCECTOMY FUSION;  Surgeon: Eustace Moore, MD;  Location: [place];  Service: Neurosurgery;  Laterality: N/A;   APPENDECTOMY     OOPHORECTOMY     POSTERIOR CERVICAL FUSION/FORAMINOTOMY N/A  03/25/2022   Procedure: Cervical Three-Cervical Seven Posterior Cervical Fusion with Lateral Mass Fixation;  Surgeon: Eustace Moore, MD;  Location: [place];  Service: Neurosurgery;  Laterality: N/A;   Past Medical History:  Diagnosis Date   Arthritis    cervical spine   ASTHMA UNSPECIFIED WITH EXACERBATION 07/21/2010   CARPAL TUNNEL SYNDROME, BILATERAL 01/16/2008   COPD 05/08/2008   Dyspnea    intermittent. worsens with anxiety   GERD 16/07/9603   HELICOBACTER PYLORI GASTRITIS, HX OF 04/18/2007   PANIC DISORDER 04/18/2007   TOBACCO ABUSE 07/17/2009   BP (!) 149/83   Pulse (!) 111   Ht '5\' 7"'$  (1.702 m)   Wt 130 lb (59 kg)   SpO2 98%   BMI 20.36 kg/m   Opioid Risk Score:   Fall Risk Score:  `1  Depression screen [facility] 2/9     11/02/2022    1:36 PM 07/20/2022    4:15 PM 05/18/2022    3:30 PM 02/23/2022    2:44 PM 12/15/2021   11:51 AM 04/29/2020    1:09 PM 06/20/2018    5:09 PM  Depression screen PHQ 2/9  Decreased Interest 0 3 0 0  0 3  Down, Depressed, Hopeless 0 1 0 0 0 0 3  PHQ - 2 Score 0 4 0 0 0 0 6  Altered sleeping  3  0 3  0  Tired, decreased energy  '3  2 3  1  '$ Change in appetite  2  0 0  0  Feeling bad or failure about yourself   2  0 0  2  Trouble concentrating  3  0 0  0  Moving slowly or fidgety/restless  0  0 0  0  Suicidal thoughts  0  0 0  0  PHQ-9 Score  '17  2 6  9  '$ Difficult doing work/chores  Extremely dIfficult           Review of Systems  Musculoskeletal:  Positive for neck pain.  All other systems reviewed and are negative.     Objective:   Physical Exam  Constitutional: No distress . Vital signs reviewed. HEENT: NCAT, EOMI, oral membranes moist Neck: supple Cardiovascular: RRR without murmur. No JVD    Respiratory/Chest: CTA Bilaterally without wheezes or rales. Normal effort    GI/Abdomen: BS +, non-tender, non-distended Ext: no clubbing, cyanosis, or edema Psych: anxious and tearful Skin: Clean and intact without signs of  breakdown Neuro:  Alert and oriented x 3. Normal insight and awareness. Intact Memory. Normal language and speech. Cranial nerve exam unremarkable, strength 4-5/5 with limitations d/t pain. No focal sensory deficitx Musculoskeletal: Ongoing substantial head forward position.  There is paravertebral muscle  wasting over the mid and lower cervical spine.  She has difficulty with any type of extension still.  Flexion is somewhat limited due to her already flexed resting position.  Rotation lateral bending also are limited.        Assessment & Plan:   1. Functional deficits secondary to polytrauma including cervical fx/subluxation s/p ACDF on 11/19/21, status post revision of C4-7 in June             -pt also with signficant adhesive capsulitis left shoulder         Continue oxycodone 10 mg every 8  hours as needed #90.  Patient is already signed a controlled substance agreement earlier this year with [place].  She'll be subject to  random drug testing     -Resume gabapentin at 100 mg at bedtime.  Increase every 2 days to 100 mg 3 times daily -Replace tizanidine with Flexeril 5 mg every 8 hours as needed             - uds TODAY  -Discussed with her the importance that her mood plays in respect to her pain.  Anxiety was really out of control today.  Will resume Lexapro 5 mg nightly.  Hold off on resuming BuSpar at this point.  Discussed finding ways to distract her mind from pain, social activities, leisure activities etc.  -Counseled patient that she should not be making adjustments to her regimen without consultation with prescribing doctor.   Thirty minutes of face to face patient care time were spent during this visit. All questions were encouraged and answered. Follow up with nurse practitioner in about a month.

## 2022-11-09 ENCOUNTER — Telehealth (HOSPITAL_COMMUNITY): Payer: Self-pay | Admitting: Physical Medicine & Rehabilitation

## 2022-11-09 LAB — TOXASSURE SELECT,+ANTIDEPR,UR

## 2022-11-09 NOTE — Telephone Encounter (Signed)
Please follow up with [name] re: + morphine result in UDS. Where might have this come from?  thanks

## 2022-11-10 ENCOUNTER — Telehealth: Payer: Self-pay | Admitting: Physical Medicine & Rehabilitation

## 2022-11-10 NOTE — Telephone Encounter (Signed)
Patient is returning a call to clinic about drug screen.

## 2022-11-10 NOTE — Telephone Encounter (Signed)
error 

## 2022-11-11 ENCOUNTER — Telehealth: Payer: Self-pay | Admitting: Physical Medicine & Rehabilitation

## 2022-11-11 NOTE — Telephone Encounter (Signed)
Patient returning [place] phone call.

## 2022-11-11 NOTE — Telephone Encounter (Signed)
Talked to [name] and she said that she never took morphine and was wondering if she can take another UDS because she never took morphine only the oxycodone that has been prescribed

## 2022-11-14 NOTE — Telephone Encounter (Signed)
[name] has more information to give you that may help her UDS result.

## 2022-11-15 ENCOUNTER — Other Ambulatory Visit: Payer: Self-pay | Admitting: Family Medicine

## 2022-11-15 DIAGNOSIS — J441 Chronic obstructive pulmonary disease with (acute) exacerbation: Secondary | ICD-10-CM

## 2022-11-16 NOTE — Telephone Encounter (Signed)
Fax resent.

## 2022-11-16 NOTE — Telephone Encounter (Signed)
Hi, [name]  Pt called to say they are claiming they never received your fax. Pt is asking that you please refax those documents. Also, they may need a new order sent, as well.

## 2022-11-17 NOTE — Telephone Encounter (Signed)
Called pt left VM to have her call me back, it is very important that I speak w/ her unable to disclose information on VM.

## 2022-11-18 NOTE — Telephone Encounter (Signed)
No answer from patient 2nd VM left

## 2022-11-21 ENCOUNTER — Other Ambulatory Visit: Payer: Self-pay | Admitting: Physical Medicine & Rehabilitation

## 2022-11-24 ENCOUNTER — Other Ambulatory Visit: Payer: Self-pay | Admitting: Physical Medicine & Rehabilitation

## 2022-11-24 DIAGNOSIS — F411 Generalized anxiety disorder: Secondary | ICD-10-CM

## 2022-11-24 NOTE — Telephone Encounter (Signed)
Spoke w/ patient and she advised that she can come in Friday, patient wanted to know what appointment was for, advised patient that she just needs to come in on Friday and let the front desk know when she is here to see me.

## 2022-11-29 ENCOUNTER — Other Ambulatory Visit: Payer: Self-pay

## 2022-11-29 DIAGNOSIS — Z5181 Encounter for therapeutic drug level monitoring: Secondary | ICD-10-CM | POA: Diagnosis not present

## 2022-11-29 DIAGNOSIS — Z79899 Other long term (current) drug therapy: Secondary | ICD-10-CM | POA: Diagnosis not present

## 2022-11-29 DIAGNOSIS — G894 Chronic pain syndrome: Secondary | ICD-10-CM

## 2022-11-29 NOTE — Telephone Encounter (Signed)
Patient never showed up Friday to see me.

## 2022-11-30 ENCOUNTER — Telehealth: Payer: Self-pay

## 2022-11-30 NOTE — Telephone Encounter (Signed)
Patient requesting refill on oxycodone, patient came in to take repeat UDS on yesterday, currently waiting on results   Last fill per pmp 11/02/22  Her explanation of the morphine was that she had a burger w/ poppy seeds in it.Marland Kitchen    11/02/2022 11/02/2022 1  Oxycodone Hcl (Ir) 10 Mg Tab 90.00 30 Za Swa [id number] Nor (3235) 0/0 45.00 MME Comm Ins [name]

## 2022-12-01 ENCOUNTER — Other Ambulatory Visit: Payer: Self-pay | Admitting: Physical Medicine & Rehabilitation

## 2022-12-01 DIAGNOSIS — M961 Postlaminectomy syndrome, not elsewhere classified: Secondary | ICD-10-CM

## 2022-12-01 DIAGNOSIS — M76892 Other specified enthesopathies of left lower limb, excluding foot: Secondary | ICD-10-CM

## 2022-12-01 NOTE — Telephone Encounter (Signed)
Patient calling again to check the status of UDS and refill oxycodone 10 to [facility] [place].

## 2022-12-02 ENCOUNTER — Telehealth: Payer: Self-pay | Admitting: Physical Medicine & Rehabilitation

## 2022-12-02 ENCOUNTER — Encounter: Payer: Medicaid Other | Admitting: Registered Nurse

## 2022-12-02 NOTE — Telephone Encounter (Signed)
I'm confused.Kaitlyn KitchenMarland KitchenMarland KitchenShe was supposed to come in for another UDS on ?2/16 but did not show up per Tierra's note. So what status are we waiting on? I already am aware of the results of the first urine. Also, could someone check with [facility] and ask if the quantitative morphine result could be consistent with someone eating a hamburger roll with poppy seeds on it?  thx

## 2022-12-02 NOTE — Telephone Encounter (Signed)
Called  patient and informed she would need to have UDS performed today before any future refills. Patient states she did go but there are no pending results.

## 2022-12-02 NOTE — Telephone Encounter (Signed)
Patient called requesting medication refill for oxycodone , patient states she only has 2 pills left  and [facility] uses [facility] [place]

## 2022-12-03 LAB — TOXASSURE SELECT,+ANTIDEPR,UR

## 2022-12-05 ENCOUNTER — Telehealth: Payer: Self-pay | Admitting: *Deleted

## 2022-12-05 NOTE — Telephone Encounter (Signed)
Urine drug screen is not compatible with human urine. (The previous drug screen 11/02/22 was incompatible as well). A large amount of oxycodone is present with no metabolites suggestive of pill drop in specimen per [facility]. I have verified the specimen being non human sample with toxicologist. I have spoke with [name] and let her know we have the results but Dr Naaman Plummer is out of the office for the week. I have also let her know that we will not be able to give her another Rx for oxycodone at this time and once Dr Naaman Plummer reviews the results we will let her know the decision.

## 2022-12-06 ENCOUNTER — Other Ambulatory Visit: Payer: Self-pay | Admitting: Family Medicine

## 2022-12-06 ENCOUNTER — Telehealth: Payer: Self-pay | Admitting: Family Medicine

## 2022-12-06 DIAGNOSIS — S129XXD Fracture of neck, unspecified, subsequent encounter: Secondary | ICD-10-CM

## 2022-12-06 NOTE — Telephone Encounter (Signed)
Pt no longer wants to go to dr Tessa Lerner and would like to have a referral to [facility] phone number [phone] and fax number [phone] for pain management

## 2022-12-07 NOTE — Telephone Encounter (Signed)
Referral placed and patient aware

## 2022-12-09 ENCOUNTER — Encounter: Payer: Medicaid Other | Admitting: Registered Nurse

## 2022-12-12 ENCOUNTER — Telehealth: Payer: Self-pay | Admitting: Family Medicine

## 2022-12-12 NOTE — Telephone Encounter (Signed)
Requesting additional info for referral [phone] - needs Office note, demographic info, radiology reports and previous pain clinic fax [phone]

## 2022-12-12 NOTE — Telephone Encounter (Signed)
Per Dr Naaman Plummer, [name] will be discharged from our clinic. A letter has been sent through [facility] and will be sent certified mail as well.

## 2022-12-13 ENCOUNTER — Other Ambulatory Visit: Payer: Self-pay | Admitting: Family Medicine

## 2022-12-13 NOTE — Telephone Encounter (Signed)
Resent information.

## 2022-12-14 ENCOUNTER — Telehealth: Payer: Self-pay | Admitting: Family Medicine

## 2022-12-14 NOTE — Telephone Encounter (Signed)
Asking if the records for referral [phone] could be emailed to [email or web address]. says the faxed info is coming in 2 or 3 pages at a time and not complete transmission

## 2022-12-15 ENCOUNTER — Telehealth: Payer: Self-pay | Admitting: Family Medicine

## 2022-12-15 NOTE — Telephone Encounter (Signed)
Pt is calling and need a order fax to [employer] [phone] to have oxygen deliver to her home

## 2022-12-15 NOTE — Telephone Encounter (Signed)
I spoke with the patient and she stated she dies have 02 at home  and this was supplied by [employer]. Patient stated she will call back to schedule an appointment

## 2022-12-20 ENCOUNTER — Other Ambulatory Visit: Payer: Self-pay | Admitting: Physical Medicine & Rehabilitation

## 2022-12-20 DIAGNOSIS — M542 Cervicalgia: Secondary | ICD-10-CM | POA: Diagnosis not present

## 2023-01-01 ENCOUNTER — Other Ambulatory Visit: Payer: Self-pay | Admitting: Physical Medicine & Rehabilitation

## 2023-02-10 ENCOUNTER — Other Ambulatory Visit: Payer: Self-pay

## 2023-02-10 ENCOUNTER — Other Ambulatory Visit: Payer: Self-pay | Admitting: Physical Medicine & Rehabilitation

## 2023-02-10 ENCOUNTER — Other Ambulatory Visit: Payer: Self-pay | Admitting: Family Medicine

## 2023-02-10 DIAGNOSIS — J441 Chronic obstructive pulmonary disease with (acute) exacerbation: Secondary | ICD-10-CM

## 2023-02-10 MED ORDER — ALBUTEROL SULFATE HFA 108 (90 BASE) MCG/ACT IN AERS: 2.0000 | INHALATION_SPRAY | RESPIRATORY_TRACT | 1 refills | Status: DC | PRN

## 2023-02-28 ENCOUNTER — Emergency Department (HOSPITAL_BASED_OUTPATIENT_CLINIC_OR_DEPARTMENT_OTHER): Payer: Medicaid Other | Admitting: Radiology

## 2023-02-28 ENCOUNTER — Emergency Department (HOSPITAL_BASED_OUTPATIENT_CLINIC_OR_DEPARTMENT_OTHER)
Admission: EM | Admit: 2023-02-28 | Discharge: 2023-02-28 | Disposition: A | Discharge status: Home / Self Care | Payer: Medicaid Other | Attending: Emergency Medicine | Admitting: Emergency Medicine

## 2023-02-28 ENCOUNTER — Other Ambulatory Visit: Payer: Self-pay

## 2023-02-28 ENCOUNTER — Other Ambulatory Visit (HOSPITAL_BASED_OUTPATIENT_CLINIC_OR_DEPARTMENT_OTHER): Payer: Self-pay

## 2023-02-28 ENCOUNTER — Encounter (HOSPITAL_BASED_OUTPATIENT_CLINIC_OR_DEPARTMENT_OTHER): Payer: Self-pay

## 2023-02-28 DIAGNOSIS — R7309 Other abnormal glucose: Secondary | ICD-10-CM | POA: Diagnosis not present

## 2023-02-28 DIAGNOSIS — R0602 Shortness of breath: Secondary | ICD-10-CM | POA: Diagnosis not present

## 2023-02-28 DIAGNOSIS — J441 Chronic obstructive pulmonary disease with (acute) exacerbation: Secondary | ICD-10-CM | POA: Insufficient documentation

## 2023-02-28 DIAGNOSIS — R55 Syncope and collapse: Secondary | ICD-10-CM | POA: Diagnosis not present

## 2023-02-28 DIAGNOSIS — J449 Chronic obstructive pulmonary disease, unspecified: Secondary | ICD-10-CM | POA: Diagnosis not present

## 2023-02-28 DIAGNOSIS — Z7951 Long term (current) use of inhaled steroids: Secondary | ICD-10-CM | POA: Diagnosis not present

## 2023-02-28 LAB — CBC
HCT: 38.2 % (ref 36.0–46.0)
Hemoglobin: 12.5 g/dL (ref 12.0–15.0)
MCH: 31.6 pg (ref 26.0–34.0)
MCHC: 32.7 g/dL (ref 30.0–36.0)
MCV: 96.5 fL (ref 80.0–100.0)
Platelets: 233 10*3/uL (ref 150–400)
RBC: 3.96 MIL/uL (ref 3.87–5.11)
RDW: 13.7 % (ref 11.5–15.5)
WBC: 13 10*3/uL — ABNORMAL HIGH (ref 4.0–10.5)
nRBC: 0 % (ref 0.0–0.2)

## 2023-02-28 LAB — CBG MONITORING, ED: Glucose-Capillary: 138 mg/dL — ABNORMAL HIGH (ref 70–99)

## 2023-02-28 LAB — BASIC METABOLIC PANEL
Anion gap: 9 (ref 5–15)
BUN: 13 mg/dL (ref 8–23)
CO2: 30 mmol/L (ref 22–32)
Calcium: 9.6 mg/dL (ref 8.9–10.3)
Chloride: 99 mmol/L (ref 98–111)
Creatinine, Ser: 0.62 mg/dL (ref 0.44–1.00)
GFR, Estimated: >60 mL/min (ref >60)
Glucose, Bld: 135 mg/dL — ABNORMAL HIGH (ref 70–99)
Potassium: 4.3 mmol/L (ref 3.5–5.1)
Sodium: 138 mmol/L (ref 135–145)

## 2023-02-28 LAB — TROPONIN I (HIGH SENSITIVITY): Troponin I (High Sensitivity): 3 ng/L (ref <18)

## 2023-02-28 MED ORDER — BENZONATATE 100 MG PO CAPS
100.0000 mg | ORAL_CAPSULE | Freq: Three times a day (TID) | ORAL | 0 refills | Status: DC
  Filled 2023-02-28: qty 21, 7d supply, fill #0

## 2023-02-28 MED ORDER — BENZONATATE 100 MG PO CAPS
100.0000 mg | ORAL_CAPSULE | Freq: Once | ORAL | Status: AC
  Administered 2023-02-28: 100 mg via ORAL
  Filled 2023-02-28: qty 1

## 2023-02-28 MED ORDER — ALBUTEROL SULFATE (2.5 MG/3ML) 0.083% IN NEBU
2.5000 mg | INHALATION_SOLUTION | Freq: Once | RESPIRATORY_TRACT | Status: AC
  Administered 2023-02-28: 2.5 mg via RESPIRATORY_TRACT
  Filled 2023-02-28: qty 3

## 2023-02-28 MED ORDER — IPRATROPIUM-ALBUTEROL 0.5-2.5 (3) MG/3ML IN SOLN
3.0000 mL | Freq: Once | RESPIRATORY_TRACT | Status: AC
  Administered 2023-02-28: 3 mL via RESPIRATORY_TRACT
  Filled 2023-02-28: qty 3

## 2023-02-28 MED ORDER — AEROCHAMBER PLUS FLO-VU LARGE MISC
1.0000 | Freq: Once | Status: DC
  Filled 2023-02-28: qty 1

## 2023-02-28 MED ORDER — METHYLPREDNISOLONE SODIUM SUCC 125 MG IJ SOLR
125.0000 mg | Freq: Once | INTRAMUSCULAR | Status: AC
  Administered 2023-02-28: 125 mg via INTRAVENOUS
  Filled 2023-02-28: qty 2

## 2023-02-28 MED ORDER — ALBUTEROL SULFATE (2.5 MG/3ML) 0.083% IN NEBU
INHALATION_SOLUTION | RESPIRATORY_TRACT | 1 refills | Status: DC
  Filled 2023-02-28: qty 150, 12d supply, fill #0

## 2023-02-28 MED ORDER — IPRATROPIUM BROMIDE 0.02 % IN SOLN
0.5000 mg | Freq: Four times a day (QID) | RESPIRATORY_TRACT | 12 refills | Status: DC
  Filled 2023-02-28: qty 150, 15d supply, fill #0

## 2023-02-28 MED ORDER — PREDNISONE 20 MG PO TABS
60.0000 mg | ORAL_TABLET | Freq: Every day | ORAL | 0 refills | Status: AC
  Filled 2023-02-28: qty 15, 5d supply, fill #0

## 2023-02-28 MED ORDER — AZITHROMYCIN 250 MG PO TABS
250.0000 mg | ORAL_TABLET | Freq: Every day | ORAL | 0 refills | Status: DC
  Filled 2023-02-28: qty 6, 5d supply, fill #0

## 2023-02-28 NOTE — ED Triage Notes (Signed)
Patient here POV from Home.  Endorses Syncopal Episode at the [employer] yesterday at 1300. Noted a Cough and SOB over the Past few days.   Still endorses SOB, Fatigue. No Pain besides Chronic Neck Pain. No Fever. Some nausea. No Emesis or Diarrhea.   NAD Noted during Triage. A&Ox4. GCS 15. Ambulatory.

## 2023-02-28 NOTE — Discharge Instructions (Addendum)
Use nebulizer solution every 4-6 hours as needed for the next 24 hours I recommend that you do this at least every 4 hours.  Take your next dose of prednisone tomorrow morning.  Take antibiotics as prescribed.  Use Tessalon Perles for cough.

## 2023-02-28 NOTE — ED Notes (Signed)
Pt verbalized understanding of d/c instructions, meds, and followup care. Denies questions. VSS, no distress noted. Steady gait to exit with all belongings.  ?

## 2023-02-28 NOTE — ED Provider Notes (Signed)
[facility] EMERGENCY DEPARTMENT AT [facility] Provider Note   CSN: [id number] Arrival date & time: 02/28/23  1359     History  Chief Complaint  Patient presents with   Shortness of Breath    [name] is a 62 y.o. female.  Is here with shortness of breath and COPD symptoms.  Using albuterol at home with minimal relief.  She does have home oxygen that she uses at times.  She has had bad cough but no fever or chills.  No sputum production.  No leg swelling no chest pain.  Denies any recent surgery or travel.  Nothing makes it worse or better.  The history is provided by the patient.       Home Medications Prior to Admission medications   Medication Sig Start Date End Date Taking? Authorizing Provider  azithromycin (ZITHROMAX) 250 MG tablet Take 1 tablet (250 mg total) by mouth daily. Take first 2 tablets together, then 1 every day until finished. 02/28/23  Yes Sawyer Mentzer, DO  benzonatate (TESSALON) 100 MG capsule Take 1 capsule (100 mg total) by mouth every 8 (eight) hours. 02/28/23  Yes Iman Orourke, DO  ipratropium (ATROVENT) 0.02 % nebulizer solution Take 2.5 mLs (0.5 mg total) by nebulization 4 (four) times daily. 02/28/23  Yes Lexys Milliner, DO  predniSONE (DELTASONE) 20 MG tablet Take 3 tablets (60 mg total) by mouth daily for 5 days. 02/28/23 03/05/23 Yes Rashanna Christiana, DO  albuterol (PROVENTIL) (2.5 MG/3ML) 0.083% nebulizer solution Use every 4-6 hours as needed for COPD symptoms 02/28/23   Zaina Jenkin, DO  albuterol (VENTOLIN HFA) 108 (90 Base) MCG/ACT inhaler Inhale 2 puffs into the lungs every 4 (four) hours as needed for wheezing or shortness of breath. 02/10/23   Burchette, Elberta Fortis, MD  busPIRone (BUSPAR) 15 MG tablet Take 1/2 tablet by mouth twice daily for 7 days then increase to 1 full tablet by mouth twice daily 03/02/22   Burchette, Elberta Fortis, MD  cefdinir (OMNICEF) 300 MG capsule Take 1 capsule (300 mg total) by mouth 2 (two) times daily. 10/05/22    Burchette, Elberta Fortis, MD  cyclobenzaprine (FLEXERIL) 5 MG tablet Take 1 tablet (5 mg total) by mouth 3 (three) times daily as needed for muscle spasms. 11/02/22   Ranelle Oyster, MD  escitalopram (LEXAPRO) 10 MG tablet TAKE 0.5 TABLETS BY MOUTH AT BEDTIME. 11/29/22   Ranelle Oyster, MD  gabapentin (NEURONTIN) 100 MG capsule TAKE 1 CAPSULE (100 MG TOTAL) BY MOUTH THREE TIMES DAILY. 11/24/22   Ranelle Oyster, MD  omeprazole (PRILOSEC) 20 MG capsule TAKE 1 CAPSULE BY MOUTH EVERY DAY 12/14/22   Burchette, Elberta Fortis, MD  Oxycodone HCl 10 MG TABS Take 1 tablet (10 mg total) by mouth every 8 (eight) hours as needed. 11/02/22   Ranelle Oyster, MD      Allergies    Aspirin    Review of Systems   Review of Systems  Physical Exam Updated Vital Signs BP 123/63   Pulse 89   Temp 97.9 F (36.6 C) (Oral)   Resp 19   Ht 5\' 7"  (1.702 m)   Wt 58.5 kg   SpO2 100%   BMI 20.20 kg/m  Physical Exam Vitals and nursing note reviewed.  Constitutional:      General: She is not in acute distress.    Appearance: She is well-developed.  HENT:     Head: Normocephalic and atraumatic.  Eyes:     Conjunctiva/sclera: Conjunctivae normal.  Cardiovascular:     Rate and Rhythm: Normal rate and regular rhythm.     Heart sounds: No murmur heard. Pulmonary:     Effort: Pulmonary effort is normal. No respiratory distress.     Breath sounds: Wheezing present.  Abdominal:     Palpations: Abdomen is soft.     Tenderness: There is no abdominal tenderness.  Musculoskeletal:        General: No swelling.     Cervical back: Neck supple.  Skin:    General: Skin is warm and dry.     Capillary Refill: Capillary refill takes less than 2 seconds.  Neurological:     Mental Status: She is alert.  Psychiatric:        Mood and Affect: Mood normal.     ED Results / Procedures / Treatments   Labs (all labs ordered are listed, but only abnormal results are displayed) Labs Reviewed  BASIC METABOLIC PANEL - Abnormal;  Notable for the following components:      Result Value   Glucose, Bld 135 (*)    All other components within normal limits  CBC - Abnormal; Notable for the following components:   WBC 13.0 (*)    All other components within normal limits  CBG MONITORING, ED - Abnormal; Notable for the following components:   Glucose-Capillary 138 (*)    All other components within normal limits  TROPONIN I (HIGH SENSITIVITY)    EKG EKG Interpretation  Date/Time:  Tuesday Feb 28 2023 14:15:24 EDT Ventricular Rate:  101 PR Interval:  112 QRS Duration: 76 QT Interval:  330 QTC Calculation: 427 R Axis:   77 Text Interpretation: Sinus tachycardia Nonspecific ST abnormality Abnormal ECG When compared with ECG of 18-Mar-2022 14:54, Nonspecific T wave abnormality now evident in Lateral leads Confirmed by Virgina Norfolk ([id number]) on 02/28/2023 2:50:02 PM  Radiology DG Chest 2 View  Result Date: 02/28/2023 CLINICAL DATA:  Syncopal episode yesterday.  Shortness of breath. EXAM: CHEST - 2 VIEW COMPARISON:  Radiographs 11/23/2021 and 11/22/2021.  CT 11/15/2021. FINDINGS: The heart size and mediastinal contours are stable. The lungs are hyperinflated with chronic central airway thickening. No edema or confluent airspace opacity. There is no pleural effusion or pneumothorax. Multiple old rib fractures are noted bilaterally status post lower cervical fusion. No acute osseous findings are seen. IMPRESSION: Chronic obstructive pulmonary disease. No evidence of acute cardiopulmonary process. Electronically Signed   By: Carey Bullocks M.D.   On: 02/28/2023 15:10    Procedures Procedures    Medications Ordered in ED Medications  AeroChamber Plus Flo-Vu Large MISC 1 each (has no administration in time range)  ipratropium-albuterol (DUONEB) 0.5-2.5 (3) MG/3ML nebulizer solution 3 mL (3 mLs Nebulization Given 02/28/23 1528)  albuterol (PROVENTIL) (2.5 MG/3ML) 0.083% nebulizer solution 2.5 mg (2.5 mg Nebulization Given  02/28/23 1528)  methylPREDNISolone sodium succinate (SOLU-MEDROL) 125 mg/2 mL injection 125 mg (125 mg Intravenous Given 02/28/23 1542)  benzonatate (TESSALON) capsule 100 mg (100 mg Oral Given 02/28/23 1553)    ED Course/ Medical Decision Making/ A&P                             Medical Decision Making Amount and/or Complexity of Data Reviewed Labs: ordered. Radiology: ordered.  Risk Prescription drug management.   [name] is here with shortness of breath.  History of COPD.  Normal vitals.  No fever.  Mild wheezing on exam.  No respiratory distress.  Given breathing treatment, IV Solu-Medrol and Tessalon Perles for what I suspect is a COPD exacerbation.  Will get CBC, BMP, troponin, chest x-ray, EKG to evaluate for cardiac etiology, pneumonia, anemia, electrolyte abnormality.  Per my review and interpretation of labs is no significant anemia or electrolyte abnormality or kidney injury.  Troponin normal.  EKG shows sinus rhythm.  No ischemic changes.  Chest x-ray per my review and interpretation shows no evidence of pneumonia or pneumothorax.  Overall she is doing much better.  I suspect a COPD exacerbation.  Will prescribe prednisone, Z-Pak, Tessalon Perles.  I have referred her to pulmonology as well.  She is not on any daily steroid inhaler which I think she benefit from.  This chart was dictated using voice recognition software.  Despite best efforts to proofread,  errors can occur which can change the documentation meaning.         Final Clinical Impression(s) / ED Diagnoses Final diagnoses:  COPD with acute exacerbation (HCC)    Rx / DC Orders ED Discharge Orders          Ordered    predniSONE (DELTASONE) 20 MG tablet  Daily        02/28/23 1555    albuterol (PROVENTIL) (2.5 MG/3ML) 0.083% nebulizer solution        02/28/23 1555    ipratropium (ATROVENT) 0.02 % nebulizer solution  4 times daily        02/28/23 1555    benzonatate (TESSALON) 100 MG capsule  Every  8 hours        02/28/23 1555    azithromycin (ZITHROMAX) 250 MG tablet  Daily        02/28/23 1555              Virgina Norfolk, DO 02/28/23 1558

## 2023-03-15 ENCOUNTER — Other Ambulatory Visit: Payer: Self-pay | Admitting: Family Medicine

## 2023-03-20 ENCOUNTER — Telehealth: Payer: Self-pay | Admitting: Family Medicine

## 2023-03-20 ENCOUNTER — Encounter: Payer: Self-pay | Admitting: Family Medicine

## 2023-03-20 NOTE — Telephone Encounter (Signed)
Patient's daughter clarified that the patient is needing Albuterol Nebulizer solution

## 2023-03-20 NOTE — Telephone Encounter (Signed)
Pt's Daughter English as a second language teacher) called to say Pt is currently incarcerated for the next 30 days.   Pt has COPD // and takes breathing treatments on a daily basis, three times a day.  Daughter is asking if MD can please fax a letter stating Pt has COPD and takes breathing treatments 3 times a day, and cannot be without her breathing medication.   Please fax letter to the [facility] in [place] (Medical Dept.)  [phone]  FAX  [phone]  [facility]

## 2023-03-21 NOTE — Telephone Encounter (Signed)
Letter has been faxed.

## 2023-05-01 ENCOUNTER — Other Ambulatory Visit: Payer: Self-pay | Admitting: Family Medicine

## 2023-05-01 ENCOUNTER — Ambulatory Visit (INDEPENDENT_AMBULATORY_CARE_PROVIDER_SITE_OTHER): Payer: Medicaid Other

## 2023-05-01 ENCOUNTER — Ambulatory Visit (INDEPENDENT_AMBULATORY_CARE_PROVIDER_SITE_OTHER): Payer: Medicaid Other | Admitting: Family Medicine

## 2023-05-01 VITALS — BP 146/74 | HR 89 | Temp 97.4°F | Ht 67.0 in | Wt 126.6 lb

## 2023-05-01 DIAGNOSIS — R0689 Other abnormalities of breathing: Secondary | ICD-10-CM

## 2023-05-01 DIAGNOSIS — J449 Chronic obstructive pulmonary disease, unspecified: Secondary | ICD-10-CM | POA: Diagnosis not present

## 2023-05-01 DIAGNOSIS — E2839 Other primary ovarian failure: Secondary | ICD-10-CM

## 2023-05-01 DIAGNOSIS — R0781 Pleurodynia: Secondary | ICD-10-CM

## 2023-05-01 DIAGNOSIS — M546 Pain in thoracic spine: Secondary | ICD-10-CM

## 2023-05-01 DIAGNOSIS — Z981 Arthrodesis status: Secondary | ICD-10-CM | POA: Diagnosis not present

## 2023-05-01 DIAGNOSIS — J441 Chronic obstructive pulmonary disease with (acute) exacerbation: Secondary | ICD-10-CM

## 2023-05-01 LAB — CBC WITH DIFFERENTIAL/PLATELET
Basophils Absolute: 0 10*3/uL (ref 0.0–0.1)
Basophils Relative: 0.2 % (ref 0.0–3.0)
Eosinophils Absolute: 0.1 10*3/uL (ref 0.0–0.7)
Eosinophils Relative: 0.8 % (ref 0.0–5.0)
HCT: 42.5 % (ref 36.0–46.0)
Hemoglobin: 14 g/dL (ref 12.0–15.0)
Lymphocytes Relative: 25.1 % (ref 12.0–46.0)
Lymphs Abs: 2.2 10*3/uL (ref 0.7–4.0)
MCHC: 32.9 g/dL (ref 30.0–36.0)
MCV: 97.4 fl (ref 78.0–100.0)
Monocytes Absolute: 0.6 10*3/uL (ref 0.1–1.0)
Monocytes Relative: 6.8 % (ref 3.0–12.0)
Neutro Abs: 5.9 10*3/uL (ref 1.4–7.7)
Neutrophils Relative %: 67.1 % (ref 43.0–77.0)
Platelets: 322 10*3/uL (ref 150.0–400.0)
RBC: 4.37 Mil/uL (ref 3.87–5.11)
RDW: 14.9 % (ref 11.5–15.5)
WBC: 8.8 10*3/uL (ref 4.0–10.5)

## 2023-05-01 MED ORDER — OXYCODONE HCL 5 MG PO TABS: 5.0000 mg | ORAL_TABLET | Freq: Four times a day (QID) | ORAL | 0 refills | Status: DC | PRN

## 2023-05-01 MED ORDER — IPRATROPIUM BROMIDE 0.02 % IN SOLN: 0.5000 mg | Freq: Four times a day (QID) | RESPIRATORY_TRACT | 11 refills | Status: DC

## 2023-05-01 NOTE — Progress Notes (Signed)
Established Patient Office Visit  Subjective   Patient ID: [name], female    DOB: 09-Feb-1961  Age: 62 y.o. MRN: [id number]  Chief Complaint  Patient presents with   Back Pain   Shortness of Breath    HPI   Ameshia is seen today with onset about 4 days ago of left sided thoracic pain toward midline.  Denies any cough.  No fevers or chills.  No night sweats.  No hemoptysis.  Longstanding history of smoking.  No recent appetite change.  Denies any leg edema or calf pain.  Pain unrelieved with over-the-counter medications. She has pending follow-up with chronic pain management-chronic back pain especially cervical neck pain.  Denies any abdominal pain.  Denies any recent acute injury during the past couple weeks.  Past Medical History:  Diagnosis Date   Arthritis    cervical spine   ASTHMA UNSPECIFIED WITH EXACERBATION 07/21/2010   CARPAL TUNNEL SYNDROME, BILATERAL 01/16/2008   COPD 05/08/2008   Dyspnea    intermittent. worsens with anxiety   GERD 04/18/2007   HELICOBACTER PYLORI GASTRITIS, HX OF 04/18/2007   PANIC DISORDER 04/18/2007   TOBACCO ABUSE 07/17/2009   Past Surgical History:  Procedure Laterality Date   ANTERIOR CERVICAL DECOMP/DISCECTOMY FUSION N/A 11/19/2021   Procedure: CERVICAL FOUR-FIVE, CERVICAL FIVE-SIX, CERVICAL SIX-SEVEN ANTERIOR CERVICAL DECOMPRESSION/DISCECTOMY FUSION;  Surgeon: Tia Alert, MD;  Location: [facility] OR;  Service: Neurosurgery;  Laterality: N/A;   APPENDECTOMY     OOPHORECTOMY     POSTERIOR CERVICAL FUSION/FORAMINOTOMY N/A 03/25/2022   Procedure: Cervical Three-Cervical Seven Posterior Cervical Fusion with Lateral Mass Fixation;  Surgeon: Tia Alert, MD;  Location: [facility] OR;  Service: Neurosurgery;  Laterality: N/A;    reports that she has been smoking cigarettes. She has a 25 pack-year smoking history. She has never used smokeless tobacco. She reports that she does not drink alcohol and does not use drugs. family history includes  Breast cancer in her paternal aunt; Cancer in her father; Clotting disorder in her brother; Diabetes in her mother; Heart disease in her father; Stomach cancer in her paternal uncle. Allergies  Allergen Reactions   Aspirin Other (See Comments)    REACTION: had h. pylori due to too many goody powders. told not to take any more asa    Review of Systems  Constitutional:  Negative for chills and fever.  Respiratory:  Negative for cough, hemoptysis, sputum production and wheezing.   Cardiovascular:  Negative for palpitations and leg swelling.  Neurological:  Negative for dizziness and loss of consciousness.      Objective:     BP (!) 146/74 (BP Location: Left Arm, Patient Position: Sitting, Cuff Size: Normal)   Pulse 89   Temp (!) 97.4 F (36.3 C) (Oral)   Ht 5\' 7"  (1.702 m)   Wt 126 lb 9.6 oz (57.4 kg)   SpO2 95%   BMI 19.83 kg/m  BP Readings from Last 3 Encounters:  05/01/23 (!) 146/74  02/28/23 123/63  11/02/22 (!) 149/83   Wt Readings from Last 3 Encounters:  05/01/23 126 lb 9.6 oz (57.4 kg)  02/28/23 129 lb (58.5 kg)  11/02/22 130 lb (59 kg)      Physical Exam Vitals reviewed.  Constitutional:      General: She is not in acute distress.    Appearance: She is well-developed. She is not ill-appearing.  Cardiovascular:     Rate and Rhythm: Normal rate and regular rhythm.  Pulmonary:     Effort: Pulmonary  effort is normal.     Comments: No respiratory distress.  No retractions.  Pulse oximetry 95% which she states is near her baseline.  She does have some diminished breath sounds right base compared to left.  No wheezes. Musculoskeletal:     Right lower leg: No tenderness. No edema.     Left lower leg: No tenderness. No edema.  Neurological:     Mental Status: She is alert.      No results found for any visits on 05/01/23.    The ASCVD Risk score (Arnett DK, et al., 2019) failed to calculate for the following reasons:   Cannot find a previous HDL lab    Cannot find a previous total cholesterol lab    Assessment & Plan:   Problem List Items Addressed This Visit   None Visit Diagnoses     Acute midline thoracic back pain    -  Primary   Decreased breath sounds at right lung base       Relevant Orders   DG Chest 2 View   CBC with Differential/Platelet   Pleuritic chest pain       Relevant Orders   CBC with Differential/Platelet   D-dimer, Quantitative     Patient presents with 4-day history of pleuritic pain with mid thoracic back pain worse with deep breathing but not triggered by movement.  No localized tenderness.  Patient no respiratory distress.  -Obtain PA and lateral chest x-ray= no obvious infiltrate or other acute abnormality.  This will be over read -Check CBC and D-dimer -Follow-up immediately for any increased shortness of breath, fever, or other concerns -Patient requesting something for pain.  She states over-the-counter medications have not helped.  We agreed to very limited number of oxycodone 5 mg #15 1 every 6 hours as needed for severe pain.  No follow-ups on file.    Evelena Peat, MD

## 2023-05-01 NOTE — Patient Instructions (Signed)
Follow up for any fever, increased shortness of breath, or other concerns.

## 2023-05-02 LAB — D-DIMER, QUANTITATIVE: D-Dimer, Quant: 0.6 mcg/mL FEU — ABNORMAL HIGH (ref <0.50)

## 2023-05-04 ENCOUNTER — Telehealth: Payer: Self-pay

## 2023-05-04 DIAGNOSIS — M961 Postlaminectomy syndrome, not elsewhere classified: Secondary | ICD-10-CM | POA: Diagnosis not present

## 2023-05-04 DIAGNOSIS — Z79891 Long term (current) use of opiate analgesic: Secondary | ICD-10-CM | POA: Diagnosis not present

## 2023-05-04 DIAGNOSIS — M5412 Radiculopathy, cervical region: Secondary | ICD-10-CM | POA: Diagnosis not present

## 2023-05-04 DIAGNOSIS — M5030 Other cervical disc degeneration, unspecified cervical region: Secondary | ICD-10-CM | POA: Diagnosis not present

## 2023-05-04 NOTE — Telephone Encounter (Signed)
I received a call report from [employer] at [facility] radiology and she stated patient has "possible height of midthoracic compression" based on recent chest x-ray.

## 2023-05-05 NOTE — Telephone Encounter (Signed)
Left a message for the patient to return my call.  

## 2023-05-05 NOTE — Telephone Encounter (Signed)
Patient informed of the message below and voiced understanding. Order placed for DEXA

## 2023-05-09 ENCOUNTER — Telehealth: Payer: Self-pay | Admitting: Family Medicine

## 2023-05-09 NOTE — Telephone Encounter (Signed)
Patient informed rx was sent on 05/02/2023

## 2023-05-09 NOTE — Telephone Encounter (Signed)
Pt states the ipratropium (ATROVENT) 0.02 % nebulizer solution wasn't what she usually uses and is requesting albuterol (PROVENTIL) (2.5 MG/3ML) 0.083% nebulizer solution be sent instead.

## 2023-05-11 DIAGNOSIS — Z79891 Long term (current) use of opiate analgesic: Secondary | ICD-10-CM | POA: Diagnosis not present

## 2023-05-11 DIAGNOSIS — M961 Postlaminectomy syndrome, not elsewhere classified: Secondary | ICD-10-CM | POA: Diagnosis not present

## 2023-05-11 DIAGNOSIS — M5030 Other cervical disc degeneration, unspecified cervical region: Secondary | ICD-10-CM | POA: Diagnosis not present

## 2023-05-11 DIAGNOSIS — M5412 Radiculopathy, cervical region: Secondary | ICD-10-CM | POA: Diagnosis not present

## 2023-05-11 DIAGNOSIS — M791 Myalgia, unspecified site: Secondary | ICD-10-CM | POA: Diagnosis not present
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
## 2023-05-31 ENCOUNTER — Telehealth: Payer: Self-pay | Admitting: Family Medicine

## 2023-05-31 NOTE — Telephone Encounter (Signed)
Requesting to be referred to different pain management provider. The current wanted to put her on morphine patches and patient doesn't want morphine patches.

## 2023-06-01 NOTE — Telephone Encounter (Signed)
Patient informed of the message below and voiced understanding  

## 2023-06-13 ENCOUNTER — Ambulatory Visit: Payer: Medicaid Other | Admitting: Internal Medicine

## 2023-06-13 ENCOUNTER — Encounter: Payer: Self-pay | Admitting: Internal Medicine

## 2023-06-13 VITALS — BP 110/64 | HR 88 | Ht 65.0 in | Wt 128.4 lb

## 2023-06-13 DIAGNOSIS — F17210 Nicotine dependence, cigarettes, uncomplicated: Secondary | ICD-10-CM | POA: Diagnosis not present

## 2023-06-13 DIAGNOSIS — J4489 Other specified chronic obstructive pulmonary disease: Secondary | ICD-10-CM | POA: Diagnosis not present

## 2023-06-13 DIAGNOSIS — J439 Emphysema, unspecified: Secondary | ICD-10-CM

## 2023-06-13 DIAGNOSIS — J449 Chronic obstructive pulmonary disease, unspecified: Secondary | ICD-10-CM

## 2023-06-13 MED ORDER — IPRATROPIUM-ALBUTEROL 0.5-2.5 (3) MG/3ML IN SOLN: 3.0000 mL | Freq: Four times a day (QID) | RESPIRATORY_TRACT | 5 refills | Status: DC | PRN

## 2023-06-13 MED ORDER — BUDESONIDE-FORMOTEROL FUMARATE 160-4.5 MCG/ACT IN AERO: 2.0000 | INHALATION_SPRAY | Freq: Two times a day (BID) | RESPIRATORY_TRACT | 12 refills | Status: DC

## 2023-06-13 NOTE — Patient Instructions (Addendum)
It was a pleasure to see you today!  Please schedule follow up scheduled with myself in 4  months.  If my schedule is not open yet, we will contact you with a reminder closer to that time. Please call [phone] if you haven't heard from [place] a month before, and always call us sooner if issues or concerns arise. You can also send [place] a message through MyChart, but but aware that this is not to be used for urgent issues and it may take up to 5-7 days to receive a reply.   Your breathing testing from 2019 shows severe COPD.  I have referred you to the lung cancer screening program - they will call you to schedule this.  Start symbicort 2 puffs twice daily, gargle after use. This is a maintenance inhaler.   I sent the duoneb (albuterol-ipratropium combo) treatments to your pharmacy. Use a flutter valve after each treatment to help you cough up mucus.   Check oxygen levels at home and make sure you're wearing oxygen if they drop below 88%.   Sometimes the inhalers we prescribe are either not covered or are too expensive.  In most cases your doctor can substitute an alternate inhaler which is covered under your insurance formulary.  Please call your insurance company to find out which inhaler is covered and let us know, and we can prescribe an alternative.   What are the benefits of quitting smoking? Quitting smoking can lower your chances of getting or dying from heart disease, lung disease, kidney failure, infection, or cancer. It can also lower your chances of getting osteoporosis, a condition that makes your bones weak. Plus, quitting smoking can help your skin look younger and reduce the chances that you will have problems with sex.  Quitting smoking will improve your health no matter how old you are, and no matter how long or how much you have smoked.  What should I do if I want to quit smoking? The letters in the word "START" can help you remember the steps to take: S = Set a quit date. T =  Tell family, friends, and the people around you that you plan to quit. A = Anticipate or plan ahead for the tough times you'll face while quitting. R = Remove cigarettes and other tobacco products from your home, car, and work. T = Talk to your doctor about getting help to quit.  How can my doctor or nurse help? Your doctor or nurse can give you advice on the best way to quit. He or she can also put you in touch with counselors or other people you can call for support. Plus, your doctor or nurse can give you medicines to: ?Reduce your craving for cigarettes ?Reduce the unpleasant symptoms that happen when you stop smoking (called "withdrawal symptoms"). You can also get help from a free phone line (1-800-QUIT-NOW) or go online to [employer].  What are the symptoms of withdrawal? The symptoms include: ?Trouble sleeping ?Being irritable, anxious or restless ?Getting frustrated or angry ?Having trouble thinking clearly  Some people who stop smoking become temporarily depressed. Some people need treatment for depression, such as counseling or antidepressant medicines. Depressed people might: ?No longer enjoy or care about doing the things they used to like to do ?Feel sad, down, hopeless, nervous, or cranky most of the day, almost every day ?Lose or gain weight ?Sleep too much or too little ?Feel tired or like they have no energy ?Feel guilty or like they  are worth nothing ?Forget things or feel confused ?Move and speak more slowly than usual ?Act restless or have trouble staying still ?Think about death or suicide  If you think you might be depressed, see your doctor or nurse. Only someone trained in mental health can tell for sure if you are depressed. If you ever feel like you might hurt yourself, go straight to the nearest emergency department. Or you can call for an ambulance (in the [place] and [place], dial 9-1-1) or call your doctor or nurse right away and tell them it is an  emergency. You can also reach the [place] National Suicide Prevention Lifeline at (639)334-0630 or http://hill.com/.  How do medicines help you stop smoking? Different medicines work in different ways: ?Nicotine replacement therapy eases withdrawal and reduces your body's craving for nicotine, the main drug found in cigarettes. There are different forms of nicotine replacement, including skin patches, lozenges, gum, nasal sprays, and "puffers" or inhalers. Many can be bought without a prescription, while others might require one. ?Bupropion is a prescription medicine that reduces your desire to smoke. This medicine is sold under the brand names Zyban and Wellbutrin. It is also available in a generic version, which is cheaper than brand name medicines. ?Varenicline (brand names: Chantix, Champix) is a prescription medicine that reduces withdrawal symptoms and cigarette cravings. If you think you'd like to take varenicline and you have a history of depression, anxiety, or heart disease, discuss this with your doctor or nurse before taking the medicine. Varenicline can also increase the effects of alcohol in some people. It's a good idea to limit drinking while you're taking it, at least until you know how it affects you.  How does counseling work? Counseling can happen during formal office visits or just over the phone. A counselor can help you: ?Figure out what triggers your smoking and what to do instead ?Overcome cravings ?Figure out what went wrong when you tried to quit before  What works best? Studies show that people have the best luck at quitting if they take medicines to help them quit and work with a Veterinary surgeon. It might also be helpful to combine nicotine replacement with one of the prescription medicines that help people quit. In some cases, it might even make sense to take bupropion and varenicline together.  What about e-cigarettes? Sometimes people wonder if using  electronic cigarettes, or "e-cigarettes," might help them quit smoking. Using e-cigarettes is also called "vaping." Doctors do not recommend e-cigarettes in place of medicines and counseling. That's because e-cigarettes still contain nicotine as well as other substances that might be harmful. It's not clear how they can affect a person's health in the long term.  Will I gain weight if I quit? Yes, you might gain a few pounds. But quitting smoking will have a much more positive effect on your health than weighing a few pounds more. Plus, you can help prevent some weight gain by being more active and eating less. Taking the medicine bupropion might help control weight gain.   What else can I do to improve my chances of quitting? You can: ?Start exercising. ?Stay away from smokers and places that you associate with smoking. If people close to you smoke, ask them to quit with you. ?Keep gum, hard candy, or something to put in your mouth handy. If you get a craving for a cigarette, try one of these instead. ?Don't give up, even if you start smoking again. It takes most people a few tries  before they succeed.  What if I am pregnant and I smoke? If you are pregnant, it's really important for the health of your baby that you quit. Ask your doctor what options you have, and what is safest for your baby
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
## 2023-06-16 ENCOUNTER — Other Ambulatory Visit (HOSPITAL_COMMUNITY): Payer: Self-pay

## 2023-06-19 ENCOUNTER — Other Ambulatory Visit: Payer: Self-pay | Admitting: Family Medicine

## 2023-06-19 DIAGNOSIS — J441 Chronic obstructive pulmonary disease with (acute) exacerbation: Secondary | ICD-10-CM

## 2023-06-22 ENCOUNTER — Other Ambulatory Visit: Payer: Self-pay | Admitting: Family Medicine

## 2023-06-27 ENCOUNTER — Telehealth: Payer: Self-pay | Admitting: Family Medicine

## 2023-06-27 DIAGNOSIS — S129XXD Fracture of neck, unspecified, subsequent encounter: Secondary | ICD-10-CM

## 2023-06-27 DIAGNOSIS — M546 Pain in thoracic spine: Secondary | ICD-10-CM

## 2023-06-27 NOTE — Telephone Encounter (Signed)
I spoke with the patient and reviewed previous message from 06/01/23 in regards to pain management request. Patient stated that she has not talked to her pain management doctor and does not plan to continue to see her due to the provider wanting to prescribe Morphine patches. Patient stated that [facility] is accepting new patient and inquired if she can have a referral to see them.

## 2023-06-27 NOTE — Telephone Encounter (Signed)
Requesting referral to another pain mgmt provider. Does not want to be on morphine patches

## 2023-06-28 NOTE — Telephone Encounter (Signed)
Patient aware referral was placed.

## 2023-06-28 NOTE — Telephone Encounter (Signed)
Referral Placed and left message for patient to return my call.

## 2023-06-28 NOTE — Addendum Note (Signed)
Addended by: Christy Sartorius on: 06/28/2023 08:57 AM   Modules accepted: Orders

## 2023-06-28 NOTE — Telephone Encounter (Signed)
Patient returned call

## 2023-07-10 ENCOUNTER — Other Ambulatory Visit: Payer: Medicaid Other | Admitting: *Deleted

## 2023-07-10 NOTE — Patient Outreach (Signed)
Medicaid Managed Care   Unsuccessful Attempt Note   07/10/2023 Name: [name] MRN: [id number] DOB: 1961/04/07  Referred by: Kristian Covey, MD Reason for referral : High Risk Managed Medicaid (Unsuccessful RNCM initial telephone outreach)   An unsuccessful telephone outreach was attempted today. The patient was referred to the case management team for assistance with care management and care coordination.    Follow Up Plan: A HIPAA compliant phone message was left for the patient providing contact information and requesting a return call. and The Managed Medicaid care management team will reach out to the patient again over the next 7 days.    Estanislado Emms RN, BSN   [facility] RN Care Coordinator [phone]

## 2023-07-10 NOTE — Patient Instructions (Signed)
Visit Information  Ms. [name]  - as a part of your Medicaid benefit, you are eligible for care management and care coordination services at no cost or copay. I was unable to reach you by phone today but would be happy to help you with your health related needs. Please feel free to call me @ [phone].   A member of the Managed Medicaid care management team will reach out to you again over the next 7 days.   Estanislado Emms RN, BSN [place]  [facility] RN Care Coordinator [phone]

## 2023-07-11 ENCOUNTER — Other Ambulatory Visit: Payer: Self-pay | Admitting: Internal Medicine

## 2023-07-17 ENCOUNTER — Other Ambulatory Visit: Payer: Self-pay | Admitting: Family Medicine

## 2023-07-17 DIAGNOSIS — J441 Chronic obstructive pulmonary disease with (acute) exacerbation: Secondary | ICD-10-CM

## 2023-07-18 ENCOUNTER — Telehealth: Payer: Self-pay

## 2023-07-18 NOTE — Telephone Encounter (Signed)
..  Medicaid Managed Care   Unsuccessful Outreach Note  07/18/2023 Name: [name] MRN: [id number] DOB: [birth year]  Referred by: Kristian Covey, MD Reason for referral : Appointment (I called the patient today to get her missed phone appointment with the MM RNCM rescheduled. I left my name and number on her VM.)   A second unsuccessful telephone outreach was attempted today. The patient was referred to the case management team for assistance with care management and care coordination.   Follow Up Plan: A HIPAA compliant phone message was left for the patient providing contact information and requesting a return call.  The care management team will reach out to the patient again over the next 7 days.   Weston Settle Care Guide  [facility] Managed  Care Guide [facility]  [phone]

## 2023-07-18 NOTE — Telephone Encounter (Signed)
Pt called stating she does not need any thing for the nebulizer, she needs VENTOLIN HFA 108 (90 Base) MCG/ACT inhaler

## 2023-07-19 NOTE — Telephone Encounter (Signed)
Pt called to F/U on her request for the: VENTOLIN HFA 108 (90 Base) MCG/ACT inhaler    [id number] - [place], [place] - [address] AT [place] [address] Phone: [phone]  Fax: [phone]

## 2023-07-21 ENCOUNTER — Telehealth: Payer: Self-pay

## 2023-07-21 MED ORDER — ALBUTEROL SULFATE HFA 108 (90 BASE) MCG/ACT IN AERS
2.0000 | INHALATION_SPRAY | RESPIRATORY_TRACT | 1 refills | Status: DC | PRN
Start: 2023-07-21 — End: 2023-09-12

## 2023-07-21 NOTE — Telephone Encounter (Signed)
..  Medicaid Managed Care   Unsuccessful Outreach Note  07/21/2023 Name: [name] MRN: [id number] DOB: [birth year]  Referred by: Kristian Covey, MD Reason for referral : Appointment   Third unsuccessful telephone outreach was attempted today. The patient was referred to the case management team for assistance with care management and care coordination. The patient's primary care provider has been notified of our unsuccessful attempts to make or maintain contact with the patient. The care management team is pleased to engage with this patient at any time in the future should he/she be interested in assistance from the care management team.   Follow Up Plan: We have been unable to make contact with the patient for follow up. The care management team is available to follow up with the patient after provider conversation with the patient regarding recommendation for care management engagement and subsequent re-referral to the care management team.   Weston Settle Care Guide  [facility] Managed  Care Guide [facility]  [phone]

## 2023-07-21 NOTE — Telephone Encounter (Signed)
Rx sent for Ventolin.  °

## 2023-07-21 NOTE — Addendum Note (Signed)
Addended by: Christy Sartorius on: 07/21/2023 09:44 AM   Modules accepted: Orders

## 2023-08-29 ENCOUNTER — Telehealth: Payer: Self-pay | Admitting: Family Medicine

## 2023-08-29 NOTE — Telephone Encounter (Signed)
Pt called to say she contacted [facility] and they are telling her they are still processing her referral and have no availability on their schedule for her yet.   Pt states she is in a lot of pain, and has no quality of life, and is asking if MD would please consider sending pain medication to hold her down until she can get an appointment with them?  Please advise.

## 2023-08-29 NOTE — Telephone Encounter (Signed)
Patient returned call

## 2023-08-29 NOTE — Telephone Encounter (Signed)
Left message for patient to return my call.

## 2023-08-29 NOTE — Telephone Encounter (Signed)
 Patient informed of the message and voiced understanding.

## 2023-09-09 ENCOUNTER — Other Ambulatory Visit: Payer: Self-pay | Admitting: Family Medicine

## 2023-09-09 DIAGNOSIS — J441 Chronic obstructive pulmonary disease with (acute) exacerbation: Secondary | ICD-10-CM

## 2023-10-08 ENCOUNTER — Other Ambulatory Visit: Payer: Self-pay | Admitting: Family Medicine

## 2023-10-10 NOTE — Telephone Encounter (Signed)
  The original prescription was discontinued on 06/13/2023 by Charlott Holler, MD. Renewing this prescription may not be appropriate.

## 2023-10-10 NOTE — Telephone Encounter (Signed)
Okay for refill or d/c?

## 2023-10-17 NOTE — Telephone Encounter (Signed)
  The original prescription was discontinued on 06/13/2023 by Charlott Holler, MD. Renewing this prescription may not be appropriate.

## 2023-10-25 ENCOUNTER — Other Ambulatory Visit: Payer: Self-pay | Admitting: Family Medicine

## 2023-10-25 DIAGNOSIS — J441 Chronic obstructive pulmonary disease with (acute) exacerbation: Secondary | ICD-10-CM

## 2023-11-21 ENCOUNTER — Other Ambulatory Visit: Payer: Self-pay | Admitting: Family Medicine

## 2023-11-21 DIAGNOSIS — J441 Chronic obstructive pulmonary disease with (acute) exacerbation: Secondary | ICD-10-CM

## 2023-11-22 ENCOUNTER — Other Ambulatory Visit: Payer: Self-pay | Admitting: Family Medicine

## 2023-11-22 DIAGNOSIS — F32A Depression, unspecified: Secondary | ICD-10-CM | POA: Diagnosis not present

## 2023-11-22 DIAGNOSIS — F410 Panic disorder [episodic paroxysmal anxiety] without agoraphobia: Secondary | ICD-10-CM | POA: Diagnosis not present

## 2023-11-22 DIAGNOSIS — F411 Generalized anxiety disorder: Secondary | ICD-10-CM | POA: Diagnosis not present

## 2023-11-23 NOTE — Telephone Encounter (Signed)
Rx done.

## 2023-12-13 ENCOUNTER — Other Ambulatory Visit: Payer: Self-pay | Admitting: Family Medicine

## 2023-12-13 DIAGNOSIS — J441 Chronic obstructive pulmonary disease with (acute) exacerbation: Secondary | ICD-10-CM

## 2023-12-24 ENCOUNTER — Emergency Department (HOSPITAL_COMMUNITY)

## 2023-12-24 ENCOUNTER — Encounter (HOSPITAL_COMMUNITY): Payer: Self-pay

## 2023-12-24 ENCOUNTER — Inpatient Hospital Stay (HOSPITAL_COMMUNITY)
Admission: EM | Admit: 2023-12-24 | Discharge: 2023-12-28 | DRG: 180 | Disposition: A | Discharge status: Home Health | Attending: Internal Medicine | Admitting: Internal Medicine

## 2023-12-24 DIAGNOSIS — R0602 Shortness of breath: Secondary | ICD-10-CM | POA: Diagnosis present

## 2023-12-24 DIAGNOSIS — R54 Age-related physical debility: Secondary | ICD-10-CM | POA: Diagnosis present

## 2023-12-24 DIAGNOSIS — M47812 Spondylosis without myelopathy or radiculopathy, cervical region: Secondary | ICD-10-CM | POA: Diagnosis present

## 2023-12-24 DIAGNOSIS — E873 Alkalosis: Secondary | ICD-10-CM | POA: Diagnosis present

## 2023-12-24 DIAGNOSIS — Z1152 Encounter for screening for COVID-19: Secondary | ICD-10-CM

## 2023-12-24 DIAGNOSIS — M545 Low back pain, unspecified: Secondary | ICD-10-CM | POA: Diagnosis present

## 2023-12-24 DIAGNOSIS — Z803 Family history of malignant neoplasm of breast: Secondary | ICD-10-CM

## 2023-12-24 DIAGNOSIS — Z886 Allergy status to analgesic agent status: Secondary | ICD-10-CM

## 2023-12-24 DIAGNOSIS — J9622 Acute and chronic respiratory failure with hypercapnia: Secondary | ICD-10-CM | POA: Diagnosis present

## 2023-12-24 DIAGNOSIS — D72829 Elevated white blood cell count, unspecified: Secondary | ICD-10-CM | POA: Diagnosis present

## 2023-12-24 DIAGNOSIS — C3411 Malignant neoplasm of upper lobe, right bronchus or lung: Secondary | ICD-10-CM | POA: Diagnosis present

## 2023-12-24 DIAGNOSIS — G8929 Other chronic pain: Secondary | ICD-10-CM | POA: Diagnosis present

## 2023-12-24 DIAGNOSIS — Z8249 Family history of ischemic heart disease and other diseases of the circulatory system: Secondary | ICD-10-CM

## 2023-12-24 DIAGNOSIS — L89151 Pressure ulcer of sacral region, stage 1: Secondary | ICD-10-CM | POA: Diagnosis present

## 2023-12-24 DIAGNOSIS — F17210 Nicotine dependence, cigarettes, uncomplicated: Secondary | ICD-10-CM | POA: Diagnosis present

## 2023-12-24 DIAGNOSIS — E871 Hypo-osmolality and hyponatremia: Secondary | ICD-10-CM | POA: Diagnosis present

## 2023-12-24 DIAGNOSIS — E785 Hyperlipidemia, unspecified: Secondary | ICD-10-CM | POA: Diagnosis present

## 2023-12-24 DIAGNOSIS — K219 Gastro-esophageal reflux disease without esophagitis: Secondary | ICD-10-CM | POA: Diagnosis present

## 2023-12-24 DIAGNOSIS — J441 Chronic obstructive pulmonary disease with (acute) exacerbation: Principal | ICD-10-CM | POA: Diagnosis present

## 2023-12-24 DIAGNOSIS — F411 Generalized anxiety disorder: Secondary | ICD-10-CM | POA: Diagnosis present

## 2023-12-24 DIAGNOSIS — J9621 Acute and chronic respiratory failure with hypoxia: Secondary | ICD-10-CM | POA: Diagnosis present

## 2023-12-24 DIAGNOSIS — Z800 Family history of malignant neoplasm of digestive organs: Secondary | ICD-10-CM

## 2023-12-24 DIAGNOSIS — Z832 Family history of diseases of the blood and blood-forming organs and certain disorders involving the immune mechanism: Secondary | ICD-10-CM | POA: Diagnosis not present

## 2023-12-24 DIAGNOSIS — Z833 Family history of diabetes mellitus: Secondary | ICD-10-CM | POA: Diagnosis not present

## 2023-12-24 DIAGNOSIS — C771 Secondary and unspecified malignant neoplasm of intrathoracic lymph nodes: Secondary | ICD-10-CM | POA: Diagnosis present

## 2023-12-24 DIAGNOSIS — Z9981 Dependence on supplemental oxygen: Secondary | ICD-10-CM

## 2023-12-24 DIAGNOSIS — Z7189 Other specified counseling: Secondary | ICD-10-CM | POA: Diagnosis not present

## 2023-12-24 DIAGNOSIS — R918 Other nonspecific abnormal finding of lung field: Secondary | ICD-10-CM | POA: Diagnosis not present

## 2023-12-24 DIAGNOSIS — Z515 Encounter for palliative care: Secondary | ICD-10-CM | POA: Diagnosis not present

## 2023-12-24 DIAGNOSIS — Z79899 Other long term (current) drug therapy: Secondary | ICD-10-CM

## 2023-12-24 LAB — CBC WITH DIFFERENTIAL/PLATELET
Abs Immature Granulocytes: 0.04 10*3/uL (ref 0.00–0.07)
Basophils Absolute: 0 10*3/uL (ref 0.0–0.1)
Basophils Relative: 0 %
Eosinophils Absolute: 0 10*3/uL (ref 0.0–0.5)
Eosinophils Relative: 0 %
HCT: 38.2 % (ref 36.0–46.0)
Hemoglobin: 12 g/dL (ref 12.0–15.0)
Immature Granulocytes: 0 %
Lymphocytes Relative: 15 %
Lymphs Abs: 1.6 10*3/uL (ref 0.7–4.0)
MCH: 31.8 pg (ref 26.0–34.0)
MCHC: 31.4 g/dL (ref 30.0–36.0)
MCV: 101.3 fL — ABNORMAL HIGH (ref 80.0–100.0)
Monocytes Absolute: 0.6 10*3/uL (ref 0.1–1.0)
Monocytes Relative: 5 %
Neutro Abs: 8.4 10*3/uL — ABNORMAL HIGH (ref 1.7–7.7)
Neutrophils Relative %: 80 %
Platelets: 262 10*3/uL (ref 150–400)
RBC: 3.77 MIL/uL — ABNORMAL LOW (ref 3.87–5.11)
RDW: 12.2 % (ref 11.5–15.5)
WBC: 10.6 10*3/uL — ABNORMAL HIGH (ref 4.0–10.5)
nRBC: 0 % (ref 0.0–0.2)

## 2023-12-24 LAB — COMPREHENSIVE METABOLIC PANEL
ALT: 10 U/L (ref 0–44)
AST: 12 U/L — ABNORMAL LOW (ref 15–41)
Albumin: 3.4 g/dL — ABNORMAL LOW (ref 3.5–5.0)
Alkaline Phosphatase: 61 U/L (ref 38–126)
Anion gap: 6 (ref 5–15)
BUN: 9 mg/dL (ref 8–23)
CO2: 33 mmol/L — ABNORMAL HIGH (ref 22–32)
Calcium: 8.4 mg/dL — ABNORMAL LOW (ref 8.9–10.3)
Chloride: 94 mmol/L — ABNORMAL LOW (ref 98–111)
Creatinine, Ser: 0.51 mg/dL (ref 0.44–1.00)
GFR, Estimated: >60 mL/min (ref >60)
Glucose, Bld: 170 mg/dL — ABNORMAL HIGH (ref 70–99)
Potassium: 4.2 mmol/L (ref 3.5–5.1)
Sodium: 133 mmol/L — ABNORMAL LOW (ref 135–145)
Total Bilirubin: 0.6 mg/dL (ref 0.0–1.2)
Total Protein: 7.2 g/dL (ref 6.5–8.1)

## 2023-12-24 LAB — BLOOD GAS, VENOUS
Acid-Base Excess: 9.9 mmol/L — ABNORMAL HIGH (ref 0.0–2.0)
Acid-Base Excess: 12.6 mmol/L — ABNORMAL HIGH (ref 0.0–2.0)
Bicarbonate: 39.4 mmol/L — ABNORMAL HIGH (ref 20.0–28.0)
Bicarbonate: 41 mmol/L — ABNORMAL HIGH (ref 20.0–28.0)
O2 Saturation: 45.1 %
O2 Saturation: 87.9 %
Patient temperature: 37
Patient temperature: 36.7
pCO2, Ven: 80 mmHg (ref 44–60)
pCO2, Ven: 70 mmHg — ABNORMAL HIGH (ref 44–60)
pH, Ven: 7.3 (ref 7.25–7.43)
pH, Ven: 7.37 (ref 7.25–7.43)
pO2, Ven: <31 mmHg — CL (ref 32–45)
pO2, Ven: 51 mmHg — ABNORMAL HIGH (ref 32–45)

## 2023-12-24 LAB — TROPONIN I (HIGH SENSITIVITY): Troponin I (High Sensitivity): 3 ng/L (ref <18)

## 2023-12-24 LAB — RESP PANEL BY RT-PCR (RSV, FLU A&B, COVID)  RVPGX2
Influenza A by PCR: NEGATIVE
Influenza B by PCR: NEGATIVE
Resp Syncytial Virus by PCR: NEGATIVE
SARS Coronavirus 2 by RT PCR: NEGATIVE

## 2023-12-24 LAB — MAGNESIUM: Magnesium: 3.4 mg/dL — ABNORMAL HIGH (ref 1.7–2.4)

## 2023-12-24 MED ORDER — PREDNISONE 20 MG PO TABS
40.0000 mg | ORAL_TABLET | Freq: Every day | ORAL | Status: DC
Start: 2023-12-26 — End: 2023-12-30
  Administered 2023-12-26 – 2023-12-28 (×3): 40 mg via ORAL
  Filled 2023-12-24 (×3): qty 2

## 2023-12-24 MED ORDER — SODIUM CHLORIDE 0.9 % IV SOLN
1.0000 g | INTRAVENOUS | Status: AC
  Administered 2023-12-25 – 2023-12-28 (×4): 1 g via INTRAVENOUS
  Filled 2023-12-24 (×5): qty 10

## 2023-12-24 MED ORDER — PROCHLORPERAZINE EDISYLATE 10 MG/2ML IJ SOLN
10.0000 mg | Freq: Once | INTRAMUSCULAR | Status: AC
  Administered 2023-12-24: 10 mg via INTRAVENOUS
  Filled 2023-12-24: qty 2

## 2023-12-24 MED ORDER — ACETAMINOPHEN 325 MG PO TABS
650.0000 mg | ORAL_TABLET | Freq: Once | ORAL | Status: AC
Start: 2023-12-24 — End: 2023-12-24
  Administered 2023-12-24: 650 mg via ORAL
  Filled 2023-12-24: qty 2

## 2023-12-24 MED ORDER — IOHEXOL 350 MG/ML SOLN
75.0000 mL | Freq: Once | INTRAVENOUS | Status: AC | PRN
  Administered 2023-12-24: 75 mL via INTRAVENOUS

## 2023-12-24 MED ORDER — GABAPENTIN 100 MG PO CAPS
100.0000 mg | ORAL_CAPSULE | Freq: Three times a day (TID) | ORAL | Status: DC
  Administered 2023-12-24 – 2023-12-28 (×12): 100 mg via ORAL
  Filled 2023-12-24 (×12): qty 1

## 2023-12-24 MED ORDER — SENNOSIDES-DOCUSATE SODIUM 8.6-50 MG PO TABS: 1.0000 | ORAL_TABLET | Freq: Every evening | ORAL | Status: DC | PRN

## 2023-12-24 MED ORDER — ACETAMINOPHEN 325 MG PO TABS
650.0000 mg | ORAL_TABLET | Freq: Four times a day (QID) | ORAL | Status: DC | PRN
  Administered 2023-12-25: 650 mg via ORAL
  Filled 2023-12-24: qty 2

## 2023-12-24 MED ORDER — ACETAMINOPHEN 650 MG RE SUPP: 650.0000 mg | Freq: Four times a day (QID) | RECTAL | Status: DC | PRN

## 2023-12-24 MED ORDER — IPRATROPIUM-ALBUTEROL 0.5-2.5 (3) MG/3ML IN SOLN
3.0000 mL | Freq: Once | RESPIRATORY_TRACT | Status: AC
  Administered 2023-12-24: 3 mL via RESPIRATORY_TRACT
  Filled 2023-12-24: qty 3

## 2023-12-24 MED ORDER — METHYLPREDNISOLONE SODIUM SUCC 125 MG IJ SOLR
125.0000 mg | Freq: Two times a day (BID) | INTRAMUSCULAR | Status: AC
  Administered 2023-12-25 (×2): 125 mg via INTRAVENOUS
  Filled 2023-12-24 (×2): qty 2

## 2023-12-24 MED ORDER — SODIUM CHLORIDE 0.9% FLUSH
3.0000 mL | Freq: Two times a day (BID) | INTRAVENOUS | Status: DC
  Administered 2023-12-24 – 2023-12-28 (×9): 3 mL via INTRAVENOUS

## 2023-12-24 MED ORDER — IPRATROPIUM-ALBUTEROL 0.5-2.5 (3) MG/3ML IN SOLN
3.0000 mL | RESPIRATORY_TRACT | Status: DC | PRN
  Administered 2023-12-26 (×3): 3 mL via RESPIRATORY_TRACT
  Filled 2023-12-24 (×4): qty 3

## 2023-12-24 MED ORDER — DIPHENHYDRAMINE HCL 50 MG/ML IJ SOLN
12.5000 mg | Freq: Once | INTRAMUSCULAR | Status: AC
  Administered 2023-12-24: 12.5 mg via INTRAVENOUS
  Filled 2023-12-24: qty 1

## 2023-12-24 MED ORDER — ENOXAPARIN SODIUM 40 MG/0.4ML IJ SOSY
40.0000 mg | PREFILLED_SYRINGE | INTRAMUSCULAR | Status: DC
Start: 2023-12-24 — End: 2023-12-24

## 2023-12-24 MED ORDER — SODIUM CHLORIDE 0.9 % IV SOLN
1.0000 g | Freq: Once | INTRAVENOUS | Status: AC
  Administered 2023-12-24: 1 g via INTRAVENOUS
  Filled 2023-12-24: qty 10

## 2023-12-24 MED ORDER — METHYLPREDNISOLONE SODIUM SUCC 125 MG IJ SOLR
125.0000 mg | Freq: Once | INTRAMUSCULAR | Status: AC
  Administered 2023-12-24: 125 mg via INTRAVENOUS
  Filled 2023-12-24: qty 2

## 2023-12-24 MED ORDER — IPRATROPIUM-ALBUTEROL 0.5-2.5 (3) MG/3ML IN SOLN
3.0000 mL | Freq: Four times a day (QID) | RESPIRATORY_TRACT | Status: AC
  Administered 2023-12-25 (×4): 3 mL via RESPIRATORY_TRACT
  Filled 2023-12-24 (×3): qty 3

## 2023-12-24 MED ORDER — SODIUM CHLORIDE 0.9 % IV SOLN
500.0000 mg | Freq: Once | INTRAVENOUS | Status: DC
  Filled 2023-12-24: qty 5

## 2023-12-24 NOTE — Progress Notes (Signed)
 PT became agitated with BiPAP about the time CT came for procedure. RT removed PT from BiPAP and placed on 2 lpm [place] (2 lpm dep)- no distress noted. RT thinking that giving her a break from BiPAP might settle her down. PT is back from CT and back on BiPAP- no issues at this time. RN aware PT on BiPAP and asked to leave PT door open so alarms are audible.

## 2023-12-24 NOTE — Progress Notes (Signed)
 eLink Physician-Brief Progress Note Patient Name: [name] DOB: 1961/01/14 MRN: [id number]   Date of Service  12/24/2023  HPI/Events of Note  63 year old with a history of COPD and anxiety who presents to the emergency department with shortness of breath chills and headaches found to have an acute exacerbation of COPD.  Initially admitted to medicine but transition to critical care after requiring BiPAP.  Patient is tachypneic in the 20s, was saturating 95% on 2 L of oxygen with otherwise normal vitals.  Results show chronic hypercapnia with associated metabolic alkalosis mild leukocytosis, and CT imaging consistent with right upper lobe masslike structure concerning for malignancy.  eICU Interventions  Maintain empiric antibiotics with ceftriaxone and steroids with methylprednisolone/prednisone  Add scheduled SVNs for 24 hours  Maintain n.p.o. after midnight, unclear whether there are additional procedure scheduled  DVT prophylaxis with SCDs, consider initiating low molecular weight heparin GI prophylaxis not indicated   0207 - Add on home Xanax, last filled in 11/24 for 15d supply. BID PRN  Intervention Category Evaluation Type: New Patient Evaluation  Uri Covey 12/24/2023, 9:17 PM

## 2023-12-24 NOTE — ED Provider Notes (Signed)
 [place] EMERGENCY DEPARTMENT AT [facility] Provider Note   CSN: [id number] Arrival date & time: 12/24/23  1224     History  Chief Complaint  Patient presents with   Shortness of Breath    [name] is a 63 y.o. female.  63 year old female with past medical history significant for COPD presents today for concern of not feeling well the past 3 days.  She states she feels like she has the flu.  She does endorse cough which is productive.  She does have severe COPD and wears 2 L nasal cannula at baseline.  She states her headache started 2 days ago and rates it as severe.  This was a gradual onset headache and not sudden and severe.  The history is provided by the patient. No language interpreter was used.       Home Medications Prior to Admission medications   Medication Sig Start Date End Date Taking? Authorizing Provider  albuterol (PROVENTIL) (2.5 MG/3ML) 0.083% nebulizer solution USE 2.5 MG PER NEBULIZER EVERY 6 HOURS AS NEEDED FOR COUGH/WHEEZE 11/23/23   Burchette, Elberta Fortis, MD  albuterol (VENTOLIN HFA) 108 (90 Base) MCG/ACT inhaler INHALE 2 PUFFS INTO THE LUNGS EVERY 4 HOURS AS NEEDED FOR WHEEZING OR SHORTNESS OF BREATH. 12/14/23   Burchette, Elberta Fortis, MD  azithromycin (ZITHROMAX) 250 MG tablet Take 1 tablet (250 mg total) by mouth daily. Take first 2 tablets together, then 1 every day until finished. 02/28/23   Curatolo, Adam, DO  benzonatate (TESSALON) 100 MG capsule Take 1 capsule (100 mg total) by mouth every 8 (eight) hours. 02/28/23   Curatolo, Adam, DO  busPIRone (BUSPAR) 15 MG tablet Take 1/2 tablet by mouth twice daily for 7 days then increase to 1 full tablet by mouth twice daily 03/02/22   Burchette, Elberta Fortis, MD  cyclobenzaprine (FLEXERIL) 5 MG tablet Take 1 tablet (5 mg total) by mouth 3 (three) times daily as needed for muscle spasms. 11/02/22   Ranelle Oyster, MD  escitalopram (LEXAPRO) 10 MG tablet TAKE 0.5 TABLETS BY MOUTH AT BEDTIME. 11/29/22    Ranelle Oyster, MD  fluticasone furoate-vilanterol (BREO ELLIPTA) 200-25 MCG/ACT AEPB Inhale 1 puff into the lungs daily. 07/13/23   Charlott Holler, MD  gabapentin (NEURONTIN) 100 MG capsule TAKE 1 CAPSULE (100 MG TOTAL) BY MOUTH THREE TIMES DAILY. 11/24/22   Ranelle Oyster, MD  ipratropium (ATROVENT) 0.02 % nebulizer solution Take 2.5 mLs (0.5 mg total) by nebulization 4 (four) times daily. 05/01/23   Burchette, Elberta Fortis, MD  ipratropium-albuterol (DUONEB) 0.5-2.5 (3) MG/3ML SOLN Take 3 mLs by nebulization every 6 (six) hours as needed. 06/13/23   Charlott Holler, MD  omeprazole (PRILOSEC) 20 MG capsule TAKE 1 CAPSULE BY MOUTH EVERY DAY 06/21/23   Burchette, Elberta Fortis, MD  oxyCODONE (ROXICODONE) 5 MG immediate release tablet Take 1 tablet (5 mg total) by mouth every 6 (six) hours as needed for severe pain. Patient not taking: Reported on 06/13/2023 05/01/23   Kristian Covey, MD      Allergies    Aspirin    Review of Systems   Review of Systems  Constitutional:  Negative for chills and fever.  Eyes:  Negative for visual disturbance.  Respiratory:  Positive for cough and shortness of breath.   Cardiovascular:  Negative for chest pain.  Neurological:  Positive for headaches. Negative for light-headedness.  All other systems reviewed and are negative.   Physical Exam Updated Vital Signs BP 124/73  Resp (!) 25   Ht 5\' 5"  (1.651 m)   Wt 58.5 kg   SpO2 97%   BMI 21.47 kg/m  Physical Exam Vitals and nursing note reviewed.  Constitutional:      General: She is not in acute distress.    Appearance: Normal appearance. She is not ill-appearing.  HENT:     Head: Normocephalic and atraumatic.     Nose: Nose normal.  Eyes:     General: No scleral icterus.    Extraocular Movements: Extraocular movements intact.     Conjunctiva/sclera: Conjunctivae normal.  Cardiovascular:     Rate and Rhythm: Normal rate and regular rhythm.     Pulses: Normal pulses.  Pulmonary:     Effort:  Respiratory distress present.     Breath sounds: No wheezing or rales.     Comments: Mild increase in work of breathing Musculoskeletal:        General: No deformity. Normal range of motion.     Cervical back: Normal range of motion.     Right lower leg: No edema.     Left lower leg: No edema.  Skin:    General: Skin is warm and dry.     Findings: No rash.  Neurological:     General: No focal deficit present.     Mental Status: She is alert. Mental status is at baseline.     ED Results / Procedures / Treatments   Labs (all labs ordered are listed, but only abnormal results are displayed) Labs Reviewed  BLOOD GAS, VENOUS - Abnormal; Notable for the following components:      Result Value   pCO2, Ven 80 (*)    pO2, Ven <31 (*)    Bicarbonate 39.4 (*)    Acid-Base Excess 9.9 (*)    All other components within normal limits  CBC WITH DIFFERENTIAL/PLATELET - Abnormal; Notable for the following components:   WBC 10.6 (*)    RBC 3.77 (*)    MCV 101.3 (*)    Neutro Abs 8.4 (*)    All other components within normal limits  COMPREHENSIVE METABOLIC PANEL - Abnormal; Notable for the following components:   Sodium 133 (*)    Chloride 94 (*)    CO2 33 (*)    Glucose, Bld 170 (*)    Calcium 8.4 (*)    Albumin 3.4 (*)    AST 12 (*)    All other components within normal limits  MAGNESIUM - Abnormal; Notable for the following components:   Magnesium 3.4 (*)    All other components within normal limits  RESP PANEL BY RT-PCR (RSV, FLU A&B, COVID)  RVPGX2  TROPONIN I (HIGH SENSITIVITY)    EKG None  Radiology DG Chest Port 1 View Result Date: 12/24/2023 CLINICAL DATA:  Dyspnea.  Flu like symptoms. EXAM: PORTABLE CHEST 1 VIEW COMPARISON:  05/01/2023 FINDINGS: Heart size is normal. No pleural fluid or interstitial edema. No airspace consolidation. The lungs appear hyperinflated with coarsened interstitial markings of COPD/emphysema. New perihilar masslike opacity in the right upper  lobe measures approximately 3.1 cm. Remote bilateral rib fractures noted. IMPRESSION: 1. New 3.1 cm perihilar masslike opacity in the right upper lobe. Recommend further evaluation with contrast-enhanced chest CT. 2. COPD/emphysema. Electronically Signed   By: Signa Kell M.D.   On: 12/24/2023 13:06    Procedures .Critical Care  Performed by: Marita Kansas, PA-C Authorized by: Marita Kansas, PA-C   Critical care provider statement:    Critical care time (  minutes):  30   Critical care was necessary to treat or prevent imminent or life-threatening deterioration of the following conditions:  Respiratory failure   Critical care was time spent personally by me on the following activities:  Development of treatment plan with patient or surrogate, discussions with consultants, evaluation of patient's response to treatment, examination of patient, ordering and review of laboratory studies, ordering and review of radiographic studies, ordering and performing treatments and interventions, pulse oximetry, re-evaluation of patient's condition and review of old charts   Care discussed with: admitting provider       Medications Ordered in ED Medications  iohexol (OMNIPAQUE) 350 MG/ML injection 75 mL (has no administration in time range)  ipratropium-albuterol (DUONEB) 0.5-2.5 (3) MG/3ML nebulizer solution 3 mL (3 mLs Nebulization Given 12/24/23 1305)  methylPREDNISolone sodium succinate (SOLU-MEDROL) 125 mg/2 mL injection 125 mg (125 mg Intravenous Given 12/24/23 1302)  acetaminophen (TYLENOL) tablet 650 mg (650 mg Oral Given 12/24/23 1259)  prochlorperazine (COMPAZINE) injection 10 mg (10 mg Intravenous Given 12/24/23 1304)  diphenhydrAMINE (BENADRYL) injection 12.5 mg (12.5 mg Intravenous Given 12/24/23 1304)    ED Course/ Medical Decision Making/ A&P                                 Medical Decision Making Amount and/or Complexity of Data Reviewed Labs: ordered. Radiology: ordered.  Risk OTC  drugs. Prescription drug management.   Medical Decision Making / ED Course   This patient presents to the ED for concern of shortness of breath, headache, this involves an extensive number of treatment options, and is a complaint that carries with it a high risk of complications and morbidity.  The differential diagnosis includes COPD exacerbation, asthma exacerbation, viral URI, pneumonia, PE, ACS, malignancy  MDM: 63 year old female presents today for concern of shortness of breath, headache.  VBG shows pCO2 of 80.  Will order BiPAP. Respiratory panel negative.  CBC with mild leukocytosis, without left shift.  CMP with preserved renal function, glucose of 170 otherwise without acute concern.  Magnesium 3.4, troponin negative.  Chest x-ray shows new 3.1 cm masslike opacity in the right upper lobe around the perihilar region.  Will further evaluate with CT.  Will add on CT head to ensure this is not metastasis.  CT chest concerning for malignancy.  CT head without evidence of metastasis or other acute concern.  Given COPD exacerbation will discuss with hospitalist for admission.  Patient can have the concerning mass evaluated inpatient.  Will add on community-acquired pneumonia coverage.  Patient notified of this.  She is in agreement with admission.  Lab Tests: -I ordered, reviewed, and interpreted labs.   The pertinent results include:   Labs Reviewed  BLOOD GAS, VENOUS - Abnormal; Notable for the following components:      Result Value   pCO2, Ven 80 (*)    pO2, Ven <31 (*)    Bicarbonate 39.4 (*)    Acid-Base Excess 9.9 (*)    All other components within normal limits  CBC WITH DIFFERENTIAL/PLATELET - Abnormal; Notable for the following components:   WBC 10.6 (*)    RBC 3.77 (*)    MCV 101.3 (*)    Neutro Abs 8.4 (*)    All other components within normal limits  COMPREHENSIVE METABOLIC PANEL - Abnormal; Notable for the following components:   Sodium 133 (*)    Chloride 94  (*)    CO2 33 (*)  Glucose, Bld 170 (*)    Calcium 8.4 (*)    Albumin 3.4 (*)    AST 12 (*)    All other components within normal limits  MAGNESIUM - Abnormal; Notable for the following components:   Magnesium 3.4 (*)    All other components within normal limits  RESP PANEL BY RT-PCR (RSV, FLU A&B, COVID)  RVPGX2  TROPONIN I (HIGH SENSITIVITY)      EKG  EKG Interpretation Date/Time:    Ventricular Rate:    PR Interval:    QRS Duration:    QT Interval:    QTC Calculation:   R Axis:      Text Interpretation:           Imaging Studies ordered: I ordered imaging studies including CT chest, chest x-ray, CT head I independently visualized and interpreted imaging. I agree with the radiologist interpretation   Medicines ordered and prescription drug management: Meds ordered this encounter  Medications   ipratropium-albuterol (DUONEB) 0.5-2.5 (3) MG/3ML nebulizer solution 3 mL   methylPREDNISolone sodium succinate (SOLU-MEDROL) 125 mg/2 mL injection 125 mg    IV methylprednisolone will be converted to either a q12h or q24h frequency with the same total daily dose (TDD).  Ordered Dose: 1 to 125 mg TDD; convert to: TDD q24h.  Ordered Dose: 126 to 250 mg TDD; convert to: TDD div q12h.  Ordered Dose: >250 mg TDD; DAW.   acetaminophen (TYLENOL) tablet 650 mg   prochlorperazine (COMPAZINE) injection 10 mg   diphenhydrAMINE (BENADRYL) injection 12.5 mg   iohexol (OMNIPAQUE) 350 MG/ML injection 75 mL    -I have reviewed the patients home medicines and have made adjustments as needed  Critical interventions BiPAP, respiratory failure   Cardiac Monitoring: The patient was maintained on a cardiac monitor.  I personally viewed and interpreted the cardiac monitored which showed an underlying rhythm of: Sinus tachycardia   Reevaluation: After the interventions noted above, I reevaluated the patient and found that they have :improved  Co morbidities that complicate the  patient evaluation  Past Medical History:  Diagnosis Date   Arthritis    cervical spine   ASTHMA UNSPECIFIED WITH EXACERBATION 07/21/2010   CARPAL TUNNEL SYNDROME, BILATERAL 01/16/2008   COPD 05/08/2008   Dyspnea    intermittent. worsens with anxiety   GERD 04/18/2007   HELICOBACTER PYLORI GASTRITIS, HX OF 04/18/2007   PANIC DISORDER 04/18/2007   TOBACCO ABUSE 07/17/2009      Dispostion: Discussed with hospitalist will evaluate patient for admission.   Final Clinical Impression(s) / ED Diagnoses Final diagnoses:  COPD exacerbation [facility])  Lung mass    Rx / DC Orders ED Discharge Orders     None         Marita Kansas, PA-C 12/24/23 1517    Glendora Score, MD 12/24/23 2113

## 2023-12-24 NOTE — H&P (Signed)
 History and Physical    [name]:[id number] DOB: [birth year] DOA: 12/24/2023  PCP: Kristian Covey, MD   Patient coming from: Home   Chief Complaint: SOB, cough   HPI: [name] is a 63 y.o. female with medical history significant for COPD, chronic hypoxic and hypercarbic respiratory failure, depression, anxiety, chronic back pain, and current smoker with presents with SOB.   She reports developing rhinorrhea, sore throat, and increased cough 3 days ago. She has since developed progressive SOB. She denies chest pain, fever, chills, sweats, or unintentional weight-loss. She has not smoked during this illness but had been smoking ~0.5 ppd.    ED Course: Upon arrival to the ED, patient is found to be afebrile and saturating mid-90s on 2 Lpm supplemental O2 with normal HR and stable BP. Labs are most notable for normal renal function, serum bicarbonate of 33, WBC 10.6, and pCO2 80 with normal pH. CT demonstrates 3.6 cm RUL mass with additional lung nodules and adenopathy.   She was treated in the ED with APAP, compazine, Benadryl, Rocephin, IV Solu-Medrol, DuoNeb, and BiPAP.    Review of Systems:  All other systems reviewed and apart from HPI, are negative.  Past Medical History:  Diagnosis Date   Arthritis    cervical spine   ASTHMA UNSPECIFIED WITH EXACERBATION 07/21/2010   CARPAL TUNNEL SYNDROME, BILATERAL 01/16/2008   COPD 05/08/2008   Dyspnea    intermittent. worsens with anxiety   GERD 04/18/2007   HELICOBACTER PYLORI GASTRITIS, HX OF 04/18/2007   PANIC DISORDER 04/18/2007   TOBACCO ABUSE 07/17/2009    Past Surgical History:  Procedure Laterality Date   ANTERIOR CERVICAL DECOMP/DISCECTOMY FUSION N/A 11/19/2021   Procedure: CERVICAL FOUR-FIVE, CERVICAL FIVE-SIX, CERVICAL SIX-SEVEN ANTERIOR CERVICAL DECOMPRESSION/DISCECTOMY FUSION;  Surgeon: Tia Alert, MD;  Location: [facility] OR;  Service: Neurosurgery;  Laterality: N/A;   APPENDECTOMY     OOPHORECTOMY      POSTERIOR CERVICAL FUSION/FORAMINOTOMY N/A 03/25/2022   Procedure: Cervical Three-Cervical Seven Posterior Cervical Fusion with Lateral Mass Fixation;  Surgeon: Tia Alert, MD;  Location: [facility] OR;  Service: Neurosurgery;  Laterality: N/A;    Social History:   reports that she has been smoking cigarettes. She started smoking about 50 years ago. She has a 74.4 pack-year smoking history. She has never used smokeless tobacco. She reports that she does not drink alcohol and does not use drugs.  Allergies  Allergen Reactions   Aspirin Other (See Comments)    REACTION: had h. pylori due to too many goody powders. told not to take any more asa    Family History  Problem Relation Age of Onset   Diabetes Mother    Cancer Father        lung smoke    Heart disease Father    Clotting disorder Brother    Breast cancer Paternal Aunt    Stomach cancer Paternal Uncle    Esophageal cancer Neg Hx    Colon cancer Neg Hx      Prior to Admission medications   Medication Sig Start Date End Date Taking? Authorizing Provider  albuterol (VENTOLIN HFA) 108 (90 Base) MCG/ACT inhaler INHALE 2 PUFFS INTO THE LUNGS EVERY 4 HOURS AS NEEDED FOR WHEEZING OR SHORTNESS OF BREATH. 12/14/23  Yes Burchette, Elberta Fortis, MD  ALPRAZolam Prudy Feeler) 1 MG tablet Take 1 mg by mouth 3 (three) times daily. 12/18/23  Yes [provider]  gabapentin (NEURONTIN) 100 MG capsule TAKE 1 CAPSULE (100 MG TOTAL) BY  MOUTH THREE TIMES DAILY. Patient taking differently: Take 100 mg by mouth 2 (two) times daily. 11/24/22  Yes Ranelle Oyster, MD  ipratropium-albuterol (DUONEB) 0.5-2.5 (3) MG/3ML SOLN Take 3 mLs by nebulization every 6 (six) hours as needed. 06/13/23  Yes Charlott Holler, MD  fluticasone furoate-vilanterol (BREO ELLIPTA) 200-25 MCG/ACT AEPB Inhale 1 puff into the lungs daily. Patient not taking: Reported on 12/24/2023 07/13/23   Charlott Holler, MD    Physical Exam: Vitals:   12/24/23 1402 12/24/23 1429 12/24/23 1545  12/24/23 1621  BP:   115/65 114/67  Pulse: 77   91  Resp: 19  (!) 22 (!) 21  Temp:  98.6 F (37 C)    TempSrc:  Oral    SpO2: 95%   96%  Weight:      Height:         Constitutional: NAD, no pallor or diaphoresis   Eyes: PERTLA, lids and conjunctivae normal ENMT: Mucous membranes are moist. Posterior pharynx clear of any exudate or lesions.   Neck: supple, no masses  Respiratory: Diminished breath sounds bilaterally with prolonged expiratory phase. Speaking full sentences.   Cardiovascular: S1 & S2 heard, regular rate and rhythm. No extremity edema.  Abdomen: no tenderness, soft. Bowel sounds active.  Musculoskeletal: no clubbing / cyanosis. No joint deformity upper and lower extremities.   Skin: no significant rashes, lesions, ulcers. Warm, dry, well-perfused. Neurologic: CN 2-12 grossly intact. Moving all extremities. Alert and oriented.  Psychiatric: Calm. Cooperative.    Labs and Imaging on Admission: I have personally reviewed following labs and imaging studies  CBC: Recent Labs  Lab 12/24/23 1252  WBC 10.6*  NEUTROABS 8.4*  HGB 12.0  HCT 38.2  MCV 101.3*  PLT 262   Basic Metabolic Panel: Recent Labs  Lab 12/24/23 1252  NA 133*  K 4.2  CL 94*  CO2 33*  GLUCOSE 170*  BUN 9  CREATININE 0.51  CALCIUM 8.4*  MG 3.4*   GFR: Estimated Creatinine Clearance: 63.9 mL/min (by C-G formula based on SCr of 0.51 mg/dL). Liver Function Tests: Recent Labs  Lab 12/24/23 1252  AST 12*  ALT 10  ALKPHOS 61  BILITOT 0.6  PROT 7.2  ALBUMIN 3.4*   No results for input(s): "LIPASE", "AMYLASE" in the last 168 hours. No results for input(s): "AMMONIA" in the last 168 hours. Coagulation Profile: No results for input(s): "INR", "PROTIME" in the last 168 hours. Cardiac Enzymes: No results for input(s): "CKTOTAL", "CKMB", "CKMBINDEX", "TROPONINI" in the last 168 hours. BNP (last 3 results) No results for input(s): "PROBNP" in the last 8760 hours. HbA1C: No results for  input(s): "HGBA1C" in the last 72 hours. CBG: No results for input(s): "GLUCAP" in the last 168 hours. Lipid Profile: No results for input(s): "CHOL", "HDL", "LDLCALC", "TRIG", "CHOLHDL", "LDLDIRECT" in the last 72 hours. Thyroid Function Tests: No results for input(s): "TSH", "T4TOTAL", "FREET4", "T3FREE", "THYROIDAB" in the last 72 hours. Anemia Panel: No results for input(s): "VITAMINB12", "FOLATE", "FERRITIN", "TIBC", "IRON", "RETICCTPCT" in the last 72 hours. Urine analysis:    Component Value Date/Time   COLORURINE YELLOW 11/16/2021 0955   APPEARANCEUR CLEAR 11/16/2021 0955   LABSPEC >1.046 (H) 11/16/2021 0955   PHURINE 5.0 11/16/2021 0955   GLUCOSEU NEGATIVE 11/16/2021 0955   HGBUR NEGATIVE 11/16/2021 0955   HGBUR negative 09/20/2007 0927   BILIRUBINUR NEGATIVE 11/16/2021 0955   BILIRUBINUR Negative 07/15/2020 1419   KETONESUR 20 (A) 11/16/2021 0955   PROTEINUR NEGATIVE 11/16/2021 0955   UROBILINOGEN  0.2 07/15/2020 1419   UROBILINOGEN 0.2 07/23/2010 1450   NITRITE NEGATIVE 11/16/2021 0955   LEUKOCYTESUR NEGATIVE 11/16/2021 0955   Sepsis Labs: @LABRCNTIP (procalcitonin:4,lacticidven:4) ) Recent Results (from the past 240 hours)  Resp panel by RT-PCR (RSV, Flu A&B, Covid) Anterior Nasal Swab     Status: None   Collection Time: 12/24/23 12:52 PM   Specimen: Anterior Nasal Swab  Result Value Ref Range Status   SARS Coronavirus 2 by RT PCR NEGATIVE NEGATIVE Final    Comment: (NOTE) SARS-CoV-2 target nucleic acids are NOT DETECTED.  The SARS-CoV-2 RNA is generally detectable in upper respiratory specimens during the acute phase of infection. The lowest concentration of SARS-CoV-2 viral copies this assay can detect is 138 copies/mL. A negative result does not preclude SARS-Cov-2 infection and should not be used as the sole basis for treatment or other patient management decisions. A negative result may occur with  improper specimen collection/handling, submission of  specimen other than nasopharyngeal swab, presence of viral mutation(s) within the areas targeted by this assay, and inadequate number of viral copies(<138 copies/mL). A negative result must be combined with clinical observations, patient history, and epidemiological information. The expected result is Negative.  Fact Sheet for Patients:  BloggerCourse.com  Fact Sheet for Healthcare Providers:  SeriousBroker.it  This test is no t yet approved or cleared by the [place] FDA and  has been authorized for detection and/or diagnosis of SARS-CoV-2 by FDA under an Emergency Use Authorization (EUA). This EUA will remain  in effect (meaning this test can be used) for the duration of the COVID-19 declaration under Section 564(b)(1) of the Act, 21 U.S.C.section 360bbb-3(b)(1), unless the authorization is terminated  or revoked sooner.       Influenza A by PCR NEGATIVE NEGATIVE Final   Influenza B by PCR NEGATIVE NEGATIVE Final    Comment: (NOTE) The Xpert Xpress SARS-CoV-2/FLU/RSV plus assay is intended as an aid in the diagnosis of influenza from Nasopharyngeal swab specimens and should not be used as a sole basis for treatment. Nasal washings and aspirates are unacceptable for Xpert Xpress SARS-CoV-2/FLU/RSV testing.  Fact Sheet for Patients: BloggerCourse.com  Fact Sheet for Healthcare Providers: SeriousBroker.it  This test is not yet approved or cleared by the [place] FDA and has been authorized for detection and/or diagnosis of SARS-CoV-2 by FDA under an Emergency Use Authorization (EUA). This EUA will remain in effect (meaning this test can be used) for the duration of the COVID-19 declaration under Section 564(b)(1) of the Act, 21 U.S.C. section 360bbb-3(b)(1), unless the authorization is terminated or revoked.     Resp Syncytial Virus by PCR NEGATIVE NEGATIVE Final     Comment: (NOTE) Fact Sheet for Patients: BloggerCourse.com  Fact Sheet for Healthcare Providers: SeriousBroker.it  This test is not yet approved or cleared by the [place] FDA and has been authorized for detection and/or diagnosis of SARS-CoV-2 by FDA under an Emergency Use Authorization (EUA). This EUA will remain in effect (meaning this test can be used) for the duration of the COVID-19 declaration under Section 564(b)(1) of the Act, 21 U.S.C. section 360bbb-3(b)(1), unless the authorization is terminated or revoked.  Performed at [facility], [address], [place], [place] [address]      Radiological Exams on Admission: CT Angio Chest PE W and/or Wo Contrast Result Date: 12/24/2023 CLINICAL DATA:  Pulmonary embolism (PE) suspected, high prob EXAM: CT ANGIOGRAPHY CHEST WITH CONTRAST TECHNIQUE: Multidetector CT imaging of the chest was performed using the standard  protocol during bolus administration of intravenous contrast. Multiplanar CT image reconstructions and MIPs were obtained to evaluate the vascular anatomy. RADIATION DOSE REDUCTION: This exam was performed according to the departmental dose-optimization program which includes automated exposure control, adjustment of the mA and/or kV according to patient size and/or use of iterative reconstruction technique. CONTRAST:  75mL OMNIPAQUE IOHEXOL 350 MG/ML SOLN COMPARISON:  X-ray 12/24/2023, CT 11/15/2021 FINDINGS: Cardiovascular: Satisfactory opacification of the pulmonary arteries to the segmental level. No evidence of pulmonary embolism. Thoracic aorta is nonaneurysmal. Scattered atherosclerotic vascular calcifications of the aorta and coronary arteries. Normal heart size. No pericardial effusion. Mediastinum/Nodes: 1.3 cm right hilar lymph node (series 11, image 60). 0.9 cm right paratracheal node (series 11, image 37). No axillary or left hilar  lymphadenopathy. Thyroid gland, trachea, and esophagus within normal limits. Lungs/Pleura: Perihilar mass in the right upper lobe with irregular margins measures 3.8 x 2.4 x 2.6 cm (series 19, image 53). 7 mm right upper lobe nodule (series 19, image 28). 1.6 cm subpleural nodule the anterior aspect of the right upper lobe (series 19, image 72). Background of emphysema. No pleural effusion or pneumothorax. Upper Abdomen: Cholelithiasis. Advanced abdominal aortic atherosclerosis. Multiple hepatic calcified granulomas. No acute abnormality. Musculoskeletal: Mild superior endplate compression fracture of T6, favored chronic although new since 2023. No suspicious bone lesion identified. No chest wall abnormality. Review of the MIP images confirms the above findings. IMPRESSION: 1. No evidence of pulmonary embolism. 2. Perihilar mass in the right upper lobe measuring up to 3.8 cm, highly suspicious for primary bronchogenic carcinoma. Recommend consultation with pulmonology/multidisciplinary tumor board. 3. Additional bilateral pulmonary nodules measuring up to 1.6 cm in the right upper lobe. Metastatic disease or synchronous malignancies are both considerations. 4. Mildly enlarged right hilar lymph node, highly suspicious for metastatic disease. Mildly prominent right paratracheal lymph node, indeterminate. 5. Mild superior endplate compression fracture of T6, favored chronic although new since 2023. 6. Cholelithiasis. 7. Aortic and coronary artery atherosclerosis (ICD10-I70.0). Electronically Signed   By: Duanne Guess D.O.   On: 12/24/2023 14:40   CT Head Wo Contrast Result Date: 12/24/2023 CLINICAL DATA:  Headache. EXAM: CT HEAD WITHOUT CONTRAST TECHNIQUE: Contiguous axial images were obtained from the base of the skull through the vertex without intravenous contrast. RADIATION DOSE REDUCTION: This exam was performed according to the departmental dose-optimization program which includes automated exposure  control, adjustment of the mA and/or kV according to patient size and/or use of iterative reconstruction technique. COMPARISON:  11/15/2021. FINDINGS: Brain: No evidence of acute infarction, hemorrhage, hydrocephalus, extra-axial collection or mass lesion/mass effect. Vascular: No hyperdense vessel or unexpected calcification. Skull: Normal. Negative for fracture or focal lesion. Sinuses/Orbits: Globes and orbits are unremarkable. Mucosal thickening lines the right maxillary sinus associated with dependent fluid. Mild right sphenoid sinus mucosal thickening. Remaining visualized sinuses are clear. Other: None. IMPRESSION: 1. No intracranial abnormalities. 2. Right maxillary and sphenoid sinus disease. Fluid in the right maxillary sinus can be seen with acute sinusitis in the appropriate clinical setting. Electronically Signed   By: Amie Portland M.D.   On: 12/24/2023 14:32   DG Chest Port 1 View Result Date: 12/24/2023 CLINICAL DATA:  Dyspnea.  Flu like symptoms. EXAM: PORTABLE CHEST 1 VIEW COMPARISON:  05/01/2023 FINDINGS: Heart size is normal. No pleural fluid or interstitial edema. No airspace consolidation. The lungs appear hyperinflated with coarsened interstitial markings of COPD/emphysema. New perihilar masslike opacity in the right upper lobe measures approximately 3.1 cm. Remote bilateral rib fractures noted. IMPRESSION: 1.  New 3.1 cm perihilar masslike opacity in the right upper lobe. Recommend further evaluation with contrast-enhanced chest CT. 2. COPD/emphysema. Electronically Signed   By: Signa Kell M.D.   On: 12/24/2023 13:06    EKG: Independently reviewed. Sinus tachycardia, rate 110, PVC.   Assessment/Plan   1. COPD exacerbation; acute on chronic hypoxic and hypercarbic respiratory failure  - Repeat blood gas, continue BiPAP if needed, culture sputum, continue systemic steroid, antibiotic, and short-acting bronchodilators    2. Lung mass  - 3.8 cm RUL mass noted on CT is concerning  for primary bronchogenic carcinoma; additional lung nodules and adenopathy noted   - Routine pulmonology consult has been requested, will hold pharmacologic VTE ppx and make her NPO after midnight for now      DVT prophylaxis: Lovenox  Code Status: Full  Level of Care: Level of care: Stepdown Family Communication: None present  Disposition Plan:  Patient is from: home  Anticipated d/c is to: TBD Anticipated d/c date is: 12/27/23  Patient currently: Pending improved respiratory status, pulmonology consultation  Consults called: Pulmonology   Admission status: Inpatient     Briscoe Deutscher, MD [facility]  12/24/2023, 5:05 PM
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
## 2023-12-24 NOTE — ED Notes (Signed)
 Respiratory contacted for BIPAP

## 2023-12-24 NOTE — Progress Notes (Signed)
 PT stating she can not breath and needs to come off BiPAP (VT good, Sp02 good, RR good, BBS diminished - clear). RT spoke with RN to inform PT had been removed from BiPAP and placed on 2 LPM [place] (as used at home), that order will be changed to PRN and requested she notify MD about removal and potential repeat VBG if MD feels it is needed (PT is alert and orientated) . PT states she is breathing better off BiPAP- no distress noted at this time.
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
## 2023-12-24 NOTE — ED Triage Notes (Signed)
 Pt BIBA from home for flu like symptoms x3 days with increasing shortness of breath  Pt endorses productive cough with yellow sputum, chills, sweats and headache of 10/10  Pt has hx of COPD and Asthma  Wears 2L [place] at home

## 2023-12-25 ENCOUNTER — Other Ambulatory Visit: Payer: Self-pay

## 2023-12-25 DIAGNOSIS — J441 Chronic obstructive pulmonary disease with (acute) exacerbation: Secondary | ICD-10-CM | POA: Diagnosis not present

## 2023-12-25 DIAGNOSIS — R918 Other nonspecific abnormal finding of lung field: Secondary | ICD-10-CM | POA: Diagnosis not present

## 2023-12-25 DIAGNOSIS — J9622 Acute and chronic respiratory failure with hypercapnia: Secondary | ICD-10-CM | POA: Diagnosis not present

## 2023-12-25 DIAGNOSIS — J9621 Acute and chronic respiratory failure with hypoxia: Secondary | ICD-10-CM | POA: Diagnosis not present

## 2023-12-25 DIAGNOSIS — F411 Generalized anxiety disorder: Secondary | ICD-10-CM | POA: Diagnosis not present

## 2023-12-25 LAB — MRSA NEXT GEN BY PCR, NASAL: MRSA by PCR Next Gen: NOT DETECTED

## 2023-12-25 LAB — BASIC METABOLIC PANEL WITH GFR
Anion gap: 10 (ref 5–15)
BUN: 14 mg/dL (ref 8–23)
CO2: 32 mmol/L (ref 22–32)
Calcium: 9.3 mg/dL (ref 8.9–10.3)
Chloride: 95 mmol/L — ABNORMAL LOW (ref 98–111)
Creatinine, Ser: 0.68 mg/dL (ref 0.44–1.00)
GFR, Estimated: >60 mL/min
Glucose, Bld: 185 mg/dL — ABNORMAL HIGH (ref 70–99)
Potassium: 4.3 mmol/L (ref 3.5–5.1)
Sodium: 137 mmol/L (ref 135–145)

## 2023-12-25 LAB — CBC
HCT: 40.1 % (ref 36.0–46.0)
Hemoglobin: 12.8 g/dL (ref 12.0–15.0)
MCH: 31.8 pg (ref 26.0–34.0)
MCHC: 31.9 g/dL (ref 30.0–36.0)
MCV: 99.8 fL (ref 80.0–100.0)
Platelets: 274 10*3/uL (ref 150–400)
RBC: 4.02 MIL/uL (ref 3.87–5.11)
RDW: 12 % (ref 11.5–15.5)
WBC: 10.7 10*3/uL — ABNORMAL HIGH (ref 4.0–10.5)
nRBC: 0 % (ref 0.0–0.2)

## 2023-12-25 LAB — HIV ANTIBODY (ROUTINE TESTING W REFLEX): HIV Screen 4th Generation wRfx: NONREACTIVE

## 2023-12-25 MED ORDER — ALPRAZOLAM 0.5 MG PO TABS
1.0000 mg | ORAL_TABLET | Freq: Two times a day (BID) | ORAL | Status: DC | PRN
  Administered 2023-12-25: 1 mg via ORAL
  Filled 2023-12-25: qty 2

## 2023-12-25 MED ORDER — OXYCODONE HCL 5 MG PO TABS
5.0000 mg | ORAL_TABLET | Freq: Four times a day (QID) | ORAL | Status: DC | PRN
  Administered 2023-12-25 – 2023-12-26 (×4): 5 mg via ORAL
  Filled 2023-12-25 (×4): qty 1

## 2023-12-25 MED ORDER — ALPRAZOLAM 0.5 MG PO TABS
1.0000 mg | ORAL_TABLET | Freq: Three times a day (TID) | ORAL | Status: DC | PRN
  Administered 2023-12-25 – 2023-12-28 (×11): 1 mg via ORAL
  Filled 2023-12-25 (×12): qty 2

## 2023-12-25 MED ORDER — ORAL CARE MOUTH RINSE: 15.0000 mL | OROMUCOSAL | Status: DC | PRN

## 2023-12-25 MED ORDER — CHLORHEXIDINE GLUCONATE CLOTH 2 % EX PADS
6.0000 | MEDICATED_PAD | Freq: Every day | CUTANEOUS | Status: DC
  Administered 2023-12-25 – 2023-12-28 (×4): 6 via TOPICAL

## 2023-12-25 MED ORDER — NICOTINE 21 MG/24HR TD PT24
21.0000 mg | MEDICATED_PATCH | Freq: Every day | TRANSDERMAL | Status: DC
  Administered 2023-12-25 – 2023-12-28 (×4): 21 mg via TRANSDERMAL
  Filled 2023-12-25 (×4): qty 1

## 2023-12-25 MED ORDER — ENOXAPARIN SODIUM 40 MG/0.4ML IJ SOSY
40.0000 mg | PREFILLED_SYRINGE | INTRAMUSCULAR | Status: DC
  Administered 2023-12-25 – 2023-12-27 (×3): 40 mg via SUBCUTANEOUS
  Filled 2023-12-25 (×3): qty 0.4

## 2023-12-25 MED ORDER — METHOCARBAMOL 500 MG PO TABS
500.0000 mg | ORAL_TABLET | Freq: Three times a day (TID) | ORAL | Status: DC
  Administered 2023-12-25 – 2023-12-28 (×10): 500 mg via ORAL
  Filled 2023-12-25 (×10): qty 1
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
## 2023-12-25 NOTE — Progress Notes (Signed)
 [facility]  PROGRESS NOTE  [name]:[id number] DOB: [birth year] DOA: 12/24/2023 PCP: Kristian Covey, MD   Brief HPI:    63 y.o. female with medical history significant for COPD, chronic hypoxic and hypercarbic respiratory failure, depression, anxiety, chronic back pain, and current smoker with presents with SOB.  CT demonstrates 3.6 cm RUL mass with additional lung nodules and adenopathy.    Assessment/Plan:   1. COPD exacerbation; acute on chronic hypoxic and hypercarbic respiratory failure  -Significantly improved -Continue prednisone 40 mg daily, DuoNebs every 6 hours scheduled -Continue budesonide, Brovana, Yupelri nebs    2. Lung mass  - 3.8 cm RUL mass noted on CT is concerning for primary bronchogenic carcinoma; additional lung nodules and adenopathy noted   - Routine pulmonology consult obtained. -Recommend to follow-up as outpatient for PET scan as well as bronchoscopy with biopsy for diagnosis -Palliative care consultation obtained  3.  Tobacco abuse -Will start nicotine patch    Medications     Chlorhexidine Gluconate Cloth  6 each Topical Daily   gabapentin  100 mg Oral TID   ipratropium-albuterol  3 mL Nebulization Q6H   methylPREDNISolone (SOLU-MEDROL) injection  125 mg Intravenous Q12H   Followed by   Melene Muller ON 12/26/2023] predniSONE  40 mg Oral Q breakfast   nicotine  21 mg Transdermal Daily   sodium chloride flush  3 mL Intravenous Q12H     Data Reviewed:   CBG:  No results for input(s): "GLUCAP" in the last 168 hours.  SpO2: 95 % O2 Flow Rate (L/min): 3 L/min FiO2 (%): (S) 35 % (was 100% on 40%)    Vitals:   12/25/23 0417 12/25/23 0500 12/25/23 0600 12/25/23 0700  BP:  124/62 132/75 (!) 100/54  Pulse: 96 96 92 84  Resp: (!) 26 (!) 25 (!) 25 17  Temp: 97.9 F (36.6 C)     TempSrc: Oral     SpO2: 91% 95% 91% 95%  Weight:      Height:          Data Reviewed:  Basic Metabolic Panel: Recent Labs  Lab 12/24/23 1252  12/25/23 0242  NA 133* 137  K 4.2 4.3  CL 94* 95*  CO2 33* 32  GLUCOSE 170* 185*  BUN 9 14  CREATININE 0.51 0.68  CALCIUM 8.4* 9.3  MG 3.4*  --     CBC: Recent Labs  Lab 12/24/23 1252 12/25/23 0242  WBC 10.6* 10.7*  NEUTROABS 8.4*  --   HGB 12.0 12.8  HCT 38.2 40.1  MCV 101.3* 99.8  PLT 262 274    LFT Recent Labs  Lab 12/24/23 1252  AST 12*  ALT 10  ALKPHOS 61  BILITOT 0.6  PROT 7.2  ALBUMIN 3.4*     Antibiotics: Anti-infectives (From admission, onward)    Start     Dose/Rate Route Frequency Ordered Stop   12/25/23 1500  cefTRIAXone (ROCEPHIN) 1 g in sodium chloride 0.9 % 100 mL IVPB        1 g 200 mL/hr over 30 Minutes Intravenous Every 24 hours 12/24/23 1537 12/29/23 1459   12/24/23 1500  cefTRIAXone (ROCEPHIN) 1 g in sodium chloride 0.9 % 100 mL IVPB        1 g 200 mL/hr over 30 Minutes Intravenous  Once 12/24/23 1454 12/24/23 1538   12/24/23 1500  azithromycin (ZITHROMAX) 500 mg in sodium chloride 0.9 % 250 mL IVPB  Status:  Discontinued        500  mg 250 mL/hr over 60 Minutes Intravenous  Once 12/24/23 1454 12/24/23 1515        DVT prophylaxis: Lovenox  Code Status: Full code  Family Communication: No family at bedside   CONSULTS PCCM   Subjective   Denies chest pain or shortness of breath.   Objective    Physical Examination:   General-appears in no acute distress Heart-S1-S2, regular, no murmur auscultated Lungs-clear to auscultation bilaterally, no wheezing or crackles auscultated Abdomen-soft, nontender, no organomegaly Extremities-no edema in the lower extremities Neuro-alert, oriented x3, no focal deficit noted  Status is: Inpatient:      Pressure Injury 12/24/23 Sacrum Mid Stage 1 -  Intact skin with non-blanchable redness of a localized area usually over a bony prominence. non-blanchable small, round spot (Active)  12/24/23 2045  Location: Sacrum  Location Orientation: Mid  Staging: Stage 1 -  Intact skin with  non-blanchable redness of a localized area usually over a bony prominence.  Wound Description (Comments): non-blanchable small, round spot  Present on Admission: Yes        Carole Deere S Crystin Lechtenberg   [facility] If 7PM-7AM, please contact night-coverage at [email or web address], Office  [phone]   12/25/2023, 7:50 AM  LOS: 1 day

## 2023-12-25 NOTE — Plan of Care (Signed)
  Problem: Education: Goal: Knowledge of General Education information will improve Description: Including pain rating scale, medication(s)/side effects and non-pharmacologic comfort measures Outcome: Progressing   Problem: Activity: Goal: Risk for activity intolerance will decrease Outcome: Progressing   Problem: Pain Managment: Goal: General experience of comfort will improve and/or be controlled Outcome: Progressing   Problem: Safety: Goal: Ability to remain free from injury will improve Outcome: Progressing   Problem: Respiratory: Goal: Levels of oxygenation will improve Outcome: Progressing

## 2023-12-25 NOTE — Plan of Care (Signed)
  Problem: Clinical Measurements: Goal: Ability to maintain clinical measurements within normal limits will improve Outcome: Progressing Goal: Cardiovascular complication will be avoided Outcome: Progressing   Problem: Activity: Goal: Risk for activity intolerance will decrease Outcome: Progressing   Problem: Nutrition: Goal: Adequate nutrition will be maintained Outcome: Progressing   Problem: Clinical Measurements: Goal: Will remain free from infection Outcome: Not Progressing Goal: Diagnostic test results will improve Outcome: Not Progressing Goal: Respiratory complications will improve Outcome: Not Progressing   Problem: Coping: Goal: Level of anxiety will decrease Outcome: Not Progressing

## 2023-12-26 ENCOUNTER — Other Ambulatory Visit: Payer: Self-pay

## 2023-12-26 ENCOUNTER — Telehealth: Payer: Self-pay | Admitting: Acute Care

## 2023-12-26 DIAGNOSIS — R918 Other nonspecific abnormal finding of lung field: Secondary | ICD-10-CM

## 2023-12-26 DIAGNOSIS — J441 Chronic obstructive pulmonary disease with (acute) exacerbation: Secondary | ICD-10-CM | POA: Diagnosis not present

## 2023-12-26 DIAGNOSIS — J9622 Acute and chronic respiratory failure with hypercapnia: Secondary | ICD-10-CM | POA: Diagnosis not present

## 2023-12-26 DIAGNOSIS — J9621 Acute and chronic respiratory failure with hypoxia: Secondary | ICD-10-CM | POA: Diagnosis not present

## 2023-12-26 LAB — BASIC METABOLIC PANEL
Anion gap: 7 (ref 5–15)
BUN: 31 mg/dL — ABNORMAL HIGH (ref 8–23)
CO2: 33 mmol/L — ABNORMAL HIGH (ref 22–32)
Calcium: 8.9 mg/dL (ref 8.9–10.3)
Chloride: 95 mmol/L — ABNORMAL LOW (ref 98–111)
Creatinine, Ser: 0.71 mg/dL (ref 0.44–1.00)
GFR, Estimated: >60 mL/min (ref >60)
Glucose, Bld: 169 mg/dL — ABNORMAL HIGH (ref 70–99)
Potassium: 4.6 mmol/L (ref 3.5–5.1)
Sodium: 135 mmol/L (ref 135–145)

## 2023-12-26 LAB — CBC
HCT: 35.4 % — ABNORMAL LOW (ref 36.0–46.0)
Hemoglobin: 11.2 g/dL — ABNORMAL LOW (ref 12.0–15.0)
MCH: 31.8 pg (ref 26.0–34.0)
MCHC: 31.6 g/dL (ref 30.0–36.0)
MCV: 100.6 fL — ABNORMAL HIGH (ref 80.0–100.0)
Platelets: 285 10*3/uL (ref 150–400)
RBC: 3.52 MIL/uL — ABNORMAL LOW (ref 3.87–5.11)
RDW: 12.1 % (ref 11.5–15.5)
WBC: 21.6 10*3/uL — ABNORMAL HIGH (ref 4.0–10.5)
nRBC: 0 % (ref 0.0–0.2)

## 2023-12-26 MED ORDER — GUAIFENESIN-DM 100-10 MG/5ML PO SYRP
5.0000 mL | ORAL_SOLUTION | ORAL | Status: DC | PRN
  Administered 2023-12-26 – 2023-12-28 (×12): 5 mL via ORAL
  Filled 2023-12-26: qty 10
  Filled 2023-12-26: qty 5
  Filled 2023-12-26: qty 10
  Filled 2023-12-26: qty 5
  Filled 2023-12-26 (×2): qty 10
  Filled 2023-12-26: qty 5
  Filled 2023-12-26 (×2): qty 10
  Filled 2023-12-26 (×2): qty 5
  Filled 2023-12-26: qty 10

## 2023-12-26 MED ORDER — ORAL CARE MOUTH RINSE: 15.0000 mL | OROMUCOSAL | Status: DC | PRN

## 2023-12-26 MED ORDER — PANTOPRAZOLE SODIUM 40 MG PO TBEC
40.0000 mg | DELAYED_RELEASE_TABLET | Freq: Two times a day (BID) | ORAL | Status: DC
  Administered 2023-12-26 – 2023-12-28 (×5): 40 mg via ORAL
  Filled 2023-12-26 (×5): qty 1

## 2023-12-26 MED ORDER — IPRATROPIUM-ALBUTEROL 0.5-2.5 (3) MG/3ML IN SOLN
3.0000 mL | Freq: Four times a day (QID) | RESPIRATORY_TRACT | Status: DC
  Administered 2023-12-26 – 2023-12-27 (×3): 3 mL via RESPIRATORY_TRACT
  Filled 2023-12-26 (×3): qty 3

## 2023-12-26 MED ORDER — OXYCODONE HCL 5 MG PO TABS
5.0000 mg | ORAL_TABLET | ORAL | Status: DC | PRN
  Administered 2023-12-26 – 2023-12-28 (×12): 5 mg via ORAL
  Filled 2023-12-26 (×12): qty 1

## 2023-12-26 NOTE — Progress Notes (Signed)
   12/26/23 1422  Spiritual Encounters  Type of Visit Initial  Care provided to: Patient  Reason for visit Routine spiritual support  OnCall Visit No   Visited with patient referred to palliative unit. Patient just diagnosed  with cancer.  Says it runs in her family on her father's side. Patient lost several relatives within the last few years. Also lost family dog. Patient lives at home with granddaughter, son and two dogs. Patient's sister will be visiting this evening and her niece visited earlier. Patient has a strong support system and relies on her faith.  Patient says she "knows where she is going when she dies." Patient unsure yet as to the type of treatment she will receive. Provided spiritual care and prayer while listening to patients story/history. Spiritual assessment  H.O.P.E. secure.

## 2023-12-26 NOTE — Plan of Care (Signed)
   Problem: Coping: Goal: Level of anxiety will decrease Outcome: Progressing   Problem: Pain Managment: Goal: General experience of comfort will improve and/or be controlled Outcome: Progressing

## 2023-12-26 NOTE — Telephone Encounter (Signed)
 Order has been placed for PET scan for patient. Nothing else further needed.

## 2023-12-26 NOTE — Progress Notes (Addendum)
 [facility]  PROGRESS NOTE  [name] QIO:[id number] DOB: [birth year] DOA: 12/24/2023 PCP: Kristian Covey, MD   Brief HPI:    63 y.o. female with medical history significant for COPD, chronic hypoxic and hypercarbic respiratory failure, depression, anxiety, chronic back pain, and current smoker with presents with SOB.  CT demonstrates 3.6 cm RUL mass with additional lung nodules and adenopathy.    Assessment/Plan:   1. COPD exacerbation; acute on chronic hypoxic and hypercarbic respiratory failure  -Significantly improved -Continue prednisone 40 mg daily, DuoNebs every 6 hours scheduled -Continue budesonide, Rosalyn Gess, Yupelri nebs -Pulmonology recommends to continue with treatment of COPD and then discharged on prednisone taper    2. Lung mass  - 3.8 cm RUL mass noted on CT is concerning for primary bronchogenic carcinoma; additional lung nodules and adenopathy noted   - Routine pulmonology consult obtained. -Recommend to follow-up as outpatient for PET scan as well as bronchoscopy with biopsy for diagnosis -Palliative care consultation obtained; awaiting discussion with palliative care  3.  Tobacco abuse -Continue nicotine patch    Medications     Chlorhexidine Gluconate Cloth  6 each Topical Daily   enoxaparin (LOVENOX) injection  40 mg Subcutaneous Q24H   gabapentin  100 mg Oral TID   methocarbamol  500 mg Oral TID   nicotine  21 mg Transdermal Daily   predniSONE  40 mg Oral Q breakfast   sodium chloride flush  3 mL Intravenous Q12H     Data Reviewed:   CBG:  No results for input(s): "GLUCAP" in the last 168 hours.  SpO2: 91 % O2 Flow Rate (L/min): 3 L/min FiO2 (%): (S) 35 % (was 100% on 40%)    Vitals:   12/26/23 0448 12/26/23 0600 12/26/23 0700 12/26/23 0800  BP:  (!) 98/44 (!) 101/49 (!) 105/56  Pulse:  86 85 93  Resp:  16 (!) 23 19  Temp: 98.6 F (37 C)     TempSrc: Oral     SpO2:  97% 97% 91%  Weight:      Height:          Data  Reviewed:  Basic Metabolic Panel: Recent Labs  Lab 12/24/23 1252 12/25/23 0242 12/26/23 0249  NA 133* 137 135  K 4.2 4.3 4.6  CL 94* 95* 95*  CO2 33* 32 33*  GLUCOSE 170* 185* 169*  BUN 9 14 31*  CREATININE 0.51 0.68 0.71  CALCIUM 8.4* 9.3 8.9  MG 3.4*  --   --     CBC: Recent Labs  Lab 12/24/23 1252 12/25/23 0242 12/26/23 0249  WBC 10.6* 10.7* 21.6*  NEUTROABS 8.4*  --   --   HGB 12.0 12.8 11.2*  HCT 38.2 40.1 35.4*  MCV 101.3* 99.8 100.6*  PLT 262 274 285    LFT Recent Labs  Lab 12/24/23 1252  AST 12*  ALT 10  ALKPHOS 61  BILITOT 0.6  PROT 7.2  ALBUMIN 3.4*     Antibiotics: Anti-infectives (From admission, onward)    Start     Dose/Rate Route Frequency Ordered Stop   12/25/23 1500  cefTRIAXone (ROCEPHIN) 1 g in sodium chloride 0.9 % 100 mL IVPB        1 g 200 mL/hr over 30 Minutes Intravenous Every 24 hours 12/24/23 1537 12/29/23 1459   12/24/23 1500  cefTRIAXone (ROCEPHIN) 1 g in sodium chloride 0.9 % 100 mL IVPB        1 g 200 mL/hr over 30 Minutes Intravenous  Once 12/24/23 1454 12/24/23 1538   12/24/23 1500  azithromycin (ZITHROMAX) 500 mg in sodium chloride 0.9 % 250 mL IVPB  Status:  Discontinued        500 mg 250 mL/hr over 60 Minutes Intravenous  Once 12/24/23 1454 12/24/23 1515        DVT prophylaxis: Lovenox  Code Status: Full code  Family Communication: Spoke with patient's daughter on phone   CONSULTS PCCM   Subjective    Denies shortness of breath  Objective    Physical Examination:   General-appears in no acute distress Heart-S1-S2, regular, no murmur auscultated Lungs-decreased breath sounds at lung bases Abdomen-soft, nontender, no organomegaly Extremities-no edema in the lower extremities Neuro-alert, oriented x3, no focal deficit noted  Status is: Inpatient:      Pressure Injury 12/24/23 Sacrum Mid Stage 1 -  Intact skin with non-blanchable redness of a localized area usually over a bony prominence.  non-blanchable small, round spot (Active)  12/24/23 2045  Location: Sacrum  Location Orientation: Mid  Staging: Stage 1 -  Intact skin with non-blanchable redness of a localized area usually over a bony prominence.  Wound Description (Comments): non-blanchable small, round spot  Present on Admission: Yes        [name]   [facility] If 7PM-7AM, please contact night-coverage at [email or web address], Office  [phone]   12/26/2023, 8:58 AM  LOS: 2 days
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
## 2023-12-27 DIAGNOSIS — Z515 Encounter for palliative care: Secondary | ICD-10-CM

## 2023-12-27 DIAGNOSIS — Z7189 Other specified counseling: Secondary | ICD-10-CM

## 2023-12-27 DIAGNOSIS — R918 Other nonspecific abnormal finding of lung field: Secondary | ICD-10-CM | POA: Diagnosis not present

## 2023-12-27 DIAGNOSIS — J441 Chronic obstructive pulmonary disease with (acute) exacerbation: Secondary | ICD-10-CM | POA: Diagnosis not present

## 2023-12-27 LAB — CBC
HCT: 37.8 % (ref 36.0–46.0)
Hemoglobin: 11.4 g/dL — ABNORMAL LOW (ref 12.0–15.0)
MCH: 31.4 pg (ref 26.0–34.0)
MCHC: 30.2 g/dL (ref 30.0–36.0)
MCV: 104.1 fL — ABNORMAL HIGH (ref 80.0–100.0)
Platelets: 295 10*3/uL (ref 150–400)
RBC: 3.63 MIL/uL — ABNORMAL LOW (ref 3.87–5.11)
RDW: 12.1 % (ref 11.5–15.5)
WBC: 17.2 10*3/uL — ABNORMAL HIGH (ref 4.0–10.5)
nRBC: 0 % (ref 0.0–0.2)

## 2023-12-27 LAB — BASIC METABOLIC PANEL
Anion gap: 11 (ref 5–15)
BUN: 25 mg/dL — ABNORMAL HIGH (ref 8–23)
CO2: 30 mmol/L (ref 22–32)
Calcium: 8.9 mg/dL (ref 8.9–10.3)
Chloride: 95 mmol/L — ABNORMAL LOW (ref 98–111)
Creatinine, Ser: 0.4 mg/dL — ABNORMAL LOW (ref 0.44–1.00)
GFR, Estimated: >60 mL/min (ref >60)
Glucose, Bld: 111 mg/dL — ABNORMAL HIGH (ref 70–99)
Potassium: 4.7 mmol/L (ref 3.5–5.1)
Sodium: 136 mmol/L (ref 135–145)

## 2023-12-27 MED ORDER — IPRATROPIUM-ALBUTEROL 0.5-2.5 (3) MG/3ML IN SOLN
3.0000 mL | Freq: Three times a day (TID) | RESPIRATORY_TRACT | Status: DC
  Administered 2023-12-27 – 2023-12-28 (×4): 3 mL via RESPIRATORY_TRACT
  Filled 2023-12-27 (×4): qty 3

## 2023-12-27 NOTE — TOC Progression Note (Signed)
 Transition of Care [facility]) - Progression Note    Patient Details  Name: [name] MRN: [id number] Date of Birth: Feb 13, 1961  Transition of [facility]) CM/SW Contact  Krystelle Prashad, Olegario Messier, RN Phone Number: 12/27/2023, 3:35 PM  Clinical Narrative: Per patient's sister [name] states they want [employer] to pick up home 02 tanks,concentrator-TC Jermaine w/[employer] to pick up DME home 02. Patient is on 02 while in hospital will monitor for home if needed. Provided w/medicare# admit to confirm [id number]-admit confirmed medicare. Will check on HHC agency-[facility] for HHPT.Has own transport home.      Expected Discharge Plan: Home w Home Health Services Barriers to Discharge: Continued Medical Work up  Expected Discharge Plan and Services                                               Social Determinants of Health (SDOH) Interventions SDOH Screenings   Food Insecurity: No Food Insecurity (12/24/2023)  Housing: Low Risk  (12/24/2023)  Transportation Needs: No Transportation Needs (12/24/2023)  Utilities: Not At Risk (12/24/2023)  Depression (PHQ2-9): Low Risk  (11/02/2022)  Tobacco Use: High Risk (12/24/2023)    Readmission Risk Interventions     No data to display

## 2023-12-27 NOTE — Evaluation (Addendum)
 Physical Therapy Evaluation Patient Details Name: [name] MRN: [id number] DOB: [birth year] Today's Date: 12/27/2023  History of Present Illness  63 yo  female admitted 12/24/23 with SOB. CT demonstrates 3.6 cm RUL mass with additional lung nodules and adenopathy. PMH: chronic hypoxic and hypercarbic respiratory failure, depression, anxiety, chronic back pain,ACDF,arthritis.  Clinical Impression  Pt admitted with above diagnosis.  Pt currently with functional limitations due to the deficits listed below (see PT Problem List). Pt will benefit from acute skilled PT to increase their independence and safety with mobility to allow discharge.   The patient is eager to begin ambulation , especially to the BR. The patient ambulated x 20' using the RW on RA staying >93% then dropping to 85%. Placed on 2 L(no 1.5) returned to 90% after ~ 1.5 " rest. Patient ambulated on 2 L x 20' again. SPO2 94%. Patient should progress to return home with family support.      If plan is discharge home, recommend the following: Assistance with cooking/housework;Assist for transportation;Help with stairs or ramp for entrance;A lot of help with bathing/dressing/bathroom   Can travel by private vehicle        Equipment Recommendations Rolling walker (2 wheels)  Recommendations for Other Services       Functional Status Assessment Patient has had a recent decline in their functional status and demonstrates the ability to make significant improvements in function in a reasonable and predictable amount of time.     Precautions / Restrictions Precautions Precautions: Fall Precaution/Restrictions Comments: on O2      Mobility  Bed Mobility Overal bed mobility: Modified Independent                  Transfers Overall transfer level: Needs assistance Equipment used: Rolling walker (2 wheels) Transfers: Sit to/from Stand Sit to Stand: Contact guard assist           General transfer comment:  unsteady to rise without support    Ambulation/Gait Ambulation/Gait assistance: Contact guard assist Gait Distance (Feet): 20 Feet (x 2) Assistive device: Rolling walker (2 wheels) Gait Pattern/deviations: Step-to pattern       General Gait Details: gait slow  Stairs            Wheelchair Mobility     Tilt Bed    Modified Rankin (Stroke Patients Only)       Balance Overall balance assessment: Mild deficits observed, not formally tested                                           Pertinent Vitals/Pain      Home Living Family/patient expects to be discharged to:: Private residence Living Arrangements: Other relatives Available Help at Discharge: Family;Available PRN/intermittently Type of Home: Mobile home Home Access: Stairs to enter Entrance Stairs-Rails: Doctor, general practice of Steps: 4   Home Layout: One level Home Equipment: None      Prior Function Prior Level of Function : Independent/Modified Independent             Mobility Comments: no AD ADLs Comments: independent     Extremity/Trunk Assessment   Upper Extremity Assessment Upper Extremity Assessment: Overall WFL for tasks assessed    Lower Extremity Assessment Lower Extremity Assessment: Generalized weakness    Cervical / Trunk Assessment Cervical / Trunk Assessment: Kyphotic  Communication   Communication Communication: No apparent difficulties  Cognition Arousal: Alert Behavior During Therapy: WFL for tasks assessed/performed   PT - Cognitive impairments: No apparent impairments                         Following commands: Intact       Cueing Cueing Techniques: Verbal cues     General Comments      Exercises     Assessment/Plan    PT Assessment Patient needs continued PT services  PT Problem List Decreased strength;Decreased activity tolerance;Cardiopulmonary status limiting activity;Decreased balance       PT  Treatment Interventions DME instruction;Therapeutic activities;Gait training;Functional mobility training;Therapeutic exercise;Patient/family education    PT Goals (Current goals can be found in the Care Plan section)  Acute Rehab PT Goals Patient Stated Goal: to go home PT Goal Formulation: With patient Time For Goal Achievement: 01/10/24 Potential to Achieve Goals: Good    Frequency Min 3X/week     Co-evaluation               AM-PAC PT "6 Clicks" Mobility  Outcome Measure Help needed turning from your back to your side while in a flat bed without using bedrails?: None Help needed moving from lying on your back to sitting on the side of a flat bed without using bedrails?: None Help needed moving to and from a bed to a chair (including a wheelchair)?: None Help needed standing up from a chair using your arms (e.g., wheelchair or bedside chair)?: A Little Help needed to walk in hospital room?: A Little Help needed climbing 3-5 steps with a railing? : A Lot 6 Click Score: 20    End of Session Equipment Utilized During Treatment: Oxygen Activity Tolerance: Patient limited by fatigue Patient left: in chair;with call bell/phone within reach;with chair alarm set Nurse Communication: Mobility status PT Visit Diagnosis: Unsteadiness on feet (R26.81)    Time: 1005-1020 PT Time Calculation (min) (ACUTE ONLY): 15 min   Charges:   PT Evaluation $PT Eval Low Complexity: 1 Low   PT General Charges $$ ACUTE PT VISIT: 1 Visit         Blanchard Kelch PT Acute Rehabilitation Services Office [phone]    Rada Hay 12/27/2023, 1:52 PM

## 2023-12-27 NOTE — Progress Notes (Signed)
 SATURATION QUALIFICATIONS: (This note is used to comply with regulatory documentation for home oxygen)  Patient Saturations on Room Air at Rest = 95%  Patient Saturations on Room Air while Ambulating = 85%  Patient Saturations on 2 Liters of oxygen while Ambulating = 92%  Please briefly explain why patient needs home oxygen: Oxygen desaturations with activity on room air, recovery time approx 1-2 minutes once placed back on 2L of O2 via [place] while ambulating to obtain O2 saturations > 91%.

## 2023-12-27 NOTE — Plan of Care (Signed)
  Problem: Education: Goal: Knowledge of General Education information will improve Description: Including pain rating scale, medication(s)/side effects and non-pharmacologic comfort measures Outcome: Progressing   Problem: Clinical Measurements: Goal: Ability to maintain clinical measurements within normal limits will improve Outcome: Progressing   Problem: Activity: Goal: Risk for activity intolerance will decrease Outcome: Progressing   Problem: Coping: Goal: Level of anxiety will decrease Outcome: Progressing   Problem: Safety: Goal: Ability to remain free from injury will improve Outcome: Progressing   

## 2023-12-27 NOTE — Telephone Encounter (Signed)
 Will follow for discharge to schedule one week f/u after PET is completed to review results.

## 2023-12-27 NOTE — Progress Notes (Signed)
 PROGRESS NOTE    [name]  ZOX:[id number] DOB: 06/04/1961 DOA: 12/24/2023 PCP: Kristian Covey, MD     Brief Narrative:  [name] is a 63 y.o. female with medical history significant for COPD, chronic hypoxic and hypercarbic respiratory failure, depression, anxiety, chronic back pain, and current smoker with presents with SOB. CT demonstrates 3.6 cm RUL mass with additional lung nodules and adenopathy.  Pulmonology was consulted.  New events last 24 hours / Subjective: Patient seen in stepdown unit.  She is currently on 1.5 L oxygen, which is her baseline.  Patient states that she has not been able to walk around, still feeling some shortness of breath.  She tells me that she has a pain clinic appointment coming up  Assessment & Plan:   Principal Problem:   COPD with acute exacerbation (HCC) Active Problems:   Chronic low back pain   Generalized anxiety disorder   Acute on chronic respiratory failure with hypoxia and hypercapnia (HCC)   Lung mass   COPD exacerbation with acute on chronic hypoxic and hypercarbic respiratory failure -COVID, influenza, RSV negative -Continue prednisone, breathing treatments for discharge on prednisone taper -Ceftriaxone  New right hilar mass -3.8 cm RUL mass noted on CT is concerning for primary bronchogenic carcinoma; additional lung nodules and adenopathy noted   -PCCM consulted, planning for outpatient PET scan and bronchoscopy  Tobacco abuse -Nicotine patch  Leukocytosis -In setting of steroid use  In agreement with assessment of the pressure ulcer as below:  Pressure Injury 12/24/23 Sacrum Mid Stage 1 -  Intact skin with non-blanchable redness of a localized area usually over a bony prominence. non-blanchable small, round spot (Active)  12/24/23 2045  Location: Sacrum  Location Orientation: Mid  Staging: Stage 1 -  Intact skin with non-blanchable redness of a localized area usually over a bony prominence.  Wound  Description (Comments): non-blanchable small, round spot  Present on Admission: Yes  Dressing Type Foam - Lift dressing to assess site every shift 12/27/23 0800         DVT prophylaxis:  enoxaparin (LOVENOX) injection 40 mg Start: 12/25/23 1415 Place and maintain sequential compression device Start: 12/25/23 1105  Code Status: Full code Family Communication: None at bedside Disposition Plan: Pending PT evaluation, home  Status is: Inpatient Remains inpatient appropriate because: SOB     Antimicrobials:  Anti-infectives (From admission, onward)    Start     Dose/Rate Route Frequency Ordered Stop   12/25/23 1500  cefTRIAXone (ROCEPHIN) 1 g in sodium chloride 0.9 % 100 mL IVPB        1 g 200 mL/hr over 30 Minutes Intravenous Every 24 hours 12/24/23 1537 12/29/23 1459   12/24/23 1500  cefTRIAXone (ROCEPHIN) 1 g in sodium chloride 0.9 % 100 mL IVPB        1 g 200 mL/hr over 30 Minutes Intravenous  Once 12/24/23 1454 12/24/23 1538   12/24/23 1500  azithromycin (ZITHROMAX) 500 mg in sodium chloride 0.9 % 250 mL IVPB  Status:  Discontinued        500 mg 250 mL/hr over 60 Minutes Intravenous  Once 12/24/23 1454 12/24/23 1515        Objective: Vitals:   12/27/23 0900 12/27/23 1000 12/27/23 1100 12/27/23 1118  BP: 115/67 116/62 118/65   Pulse: 88 81 87 97  Resp: 17 15 (!) 23 (!) 21  Temp:      TempSrc:      SpO2: 93% 97% 97% 100%  Weight:  Height:        Intake/Output Summary (Last 24 hours) at 12/27/2023 1150 Last data filed at 12/27/2023 0800 Gross per 24 hour  Intake 854.2 ml  Output 450 ml  Net 404.2 ml   Filed Weights   12/24/23 1241 12/24/23 2059  Weight: 58.5 kg 59.4 kg    Examination:  General exam: Appears calm and comfortable  Respiratory system: Clear to auscultation. Respiratory effort normal. No respiratory distress. No conversational dyspnea. On [place] O2  Cardiovascular system: S1 & S2 heard, RRR. No murmurs. No pedal edema. Extremities: Symmetric  in appearance  Psychiatry: Judgement and insight appear normal. Mood & affect appropriate.   Data Reviewed: I have personally reviewed following labs and imaging studies  CBC: Recent Labs  Lab 12/24/23 1252 12/25/23 0242 12/26/23 0249 12/27/23 0305  WBC 10.6* 10.7* 21.6* 17.2*  NEUTROABS 8.4*  --   --   --   HGB 12.0 12.8 11.2* 11.4*  HCT 38.2 40.1 35.4* 37.8  MCV 101.3* 99.8 100.6* 104.1*  PLT 262 274 285 295   Basic Metabolic Panel: Recent Labs  Lab 12/24/23 1252 12/25/23 0242 12/26/23 0249 12/27/23 0305  NA 133* 137 135 136  K 4.2 4.3 4.6 4.7  CL 94* 95* 95* 95*  CO2 33* 32 33* 30  GLUCOSE 170* 185* 169* 111*  BUN 9 14 31* 25*  CREATININE 0.51 0.68 0.71 0.40*  CALCIUM 8.4* 9.3 8.9 8.9  MG 3.4*  --   --   --    GFR: Estimated Creatinine Clearance: 63.9 mL/min (A) (by C-G formula based on SCr of 0.4 mg/dL (L)). Liver Function Tests: Recent Labs  Lab 12/24/23 1252  AST 12*  ALT 10  ALKPHOS 61  BILITOT 0.6  PROT 7.2  ALBUMIN 3.4*   No results for input(s): "LIPASE", "AMYLASE" in the last 168 hours. No results for input(s): "AMMONIA" in the last 168 hours. Coagulation Profile: No results for input(s): "INR", "PROTIME" in the last 168 hours. Cardiac Enzymes: No results for input(s): "CKTOTAL", "CKMB", "CKMBINDEX", "TROPONINI" in the last 168 hours. BNP (last 3 results) No results for input(s): "PROBNP" in the last 8760 hours. HbA1C: No results for input(s): "HGBA1C" in the last 72 hours. CBG: No results for input(s): "GLUCAP" in the last 168 hours. Lipid Profile: No results for input(s): "CHOL", "HDL", "LDLCALC", "TRIG", "CHOLHDL", "LDLDIRECT" in the last 72 hours. Thyroid Function Tests: No results for input(s): "TSH", "T4TOTAL", "FREET4", "T3FREE", "THYROIDAB" in the last 72 hours. Anemia Panel: No results for input(s): "VITAMINB12", "FOLATE", "FERRITIN", "TIBC", "IRON", "RETICCTPCT" in the last 72 hours. Sepsis Labs: No results for input(s):  "PROCALCITON", "LATICACIDVEN" in the last 168 hours.  Recent Results (from the past 240 hours)  Resp panel by RT-PCR (RSV, Flu A&B, Covid) Anterior Nasal Swab     Status: None   Collection Time: 12/24/23 12:52 PM   Specimen: Anterior Nasal Swab  Result Value Ref Range Status   SARS Coronavirus 2 by RT PCR NEGATIVE NEGATIVE Final    Comment: (NOTE) SARS-CoV-2 target nucleic acids are NOT DETECTED.  The SARS-CoV-2 RNA is generally detectable in upper respiratory specimens during the acute phase of infection. The lowest concentration of SARS-CoV-2 viral copies this assay can detect is 138 copies/mL. A negative result does not preclude SARS-Cov-2 infection and should not be used as the sole basis for treatment or other patient management decisions. A negative result may occur with  improper specimen collection/handling, submission of specimen other than nasopharyngeal swab, presence of viral mutation(s)  within the areas targeted by this assay, and inadequate number of viral copies(<138 copies/mL). A negative result must be combined with clinical observations, patient history, and epidemiological information. The expected result is Negative.  Fact Sheet for Patients:  BloggerCourse.com  Fact Sheet for Healthcare Providers:  SeriousBroker.it  This test is no t yet approved or cleared by the [place] FDA and  has been authorized for detection and/or diagnosis of SARS-CoV-2 by FDA under an Emergency Use Authorization (EUA). This EUA will remain  in effect (meaning this test can be used) for the duration of the COVID-19 declaration under Section 564(b)(1) of the Act, 21 U.S.C.section 360bbb-3(b)(1), unless the authorization is terminated  or revoked sooner.       Influenza A by PCR NEGATIVE NEGATIVE Final   Influenza B by PCR NEGATIVE NEGATIVE Final    Comment: (NOTE) The Xpert Xpress SARS-CoV-2/FLU/RSV plus assay is intended as  an aid in the diagnosis of influenza from Nasopharyngeal swab specimens and should not be used as a sole basis for treatment. Nasal washings and aspirates are unacceptable for Xpert Xpress SARS-CoV-2/FLU/RSV testing.  Fact Sheet for Patients: BloggerCourse.com  Fact Sheet for Healthcare Providers: SeriousBroker.it  This test is not yet approved or cleared by the [place] FDA and has been authorized for detection and/or diagnosis of SARS-CoV-2 by FDA under an Emergency Use Authorization (EUA). This EUA will remain in effect (meaning this test can be used) for the duration of the COVID-19 declaration under Section 564(b)(1) of the Act, 21 U.S.C. section 360bbb-3(b)(1), unless the authorization is terminated or revoked.     Resp Syncytial Virus by PCR NEGATIVE NEGATIVE Final    Comment: (NOTE) Fact Sheet for Patients: BloggerCourse.com  Fact Sheet for Healthcare Providers: SeriousBroker.it  This test is not yet approved or cleared by the [place] FDA and has been authorized for detection and/or diagnosis of SARS-CoV-2 by FDA under an Emergency Use Authorization (EUA). This EUA will remain in effect (meaning this test can be used) for the duration of the COVID-19 declaration under Section 564(b)(1) of the Act, 21 U.S.C. section 360bbb-3(b)(1), unless the authorization is terminated or revoked.  Performed at [facility], [address] [place], [place] [address]   MRSA Next Gen by PCR, Nasal     Status: None   Collection Time: 12/25/23  4:49 PM   Specimen: Nasal Mucosa; Nasal Swab  Result Value Ref Range Status   MRSA by PCR Next Gen NOT DETECTED NOT DETECTED Final    Comment: (NOTE) The GeneXpert MRSA Assay (FDA approved for NASAL specimens only), is one component of a comprehensive MRSA colonization surveillance program. It is not intended to  diagnose MRSA infection nor to guide or monitor treatment for MRSA infections. Test performance is not FDA approved in patients less than 52 years old. Performed at [facility], [address] [place], [place] [address]       Radiology Studies: No results found.    Scheduled Meds:  Chlorhexidine Gluconate Cloth  6 each Topical Daily   enoxaparin (LOVENOX) injection  40 mg Subcutaneous Q24H   gabapentin  100 mg Oral TID   ipratropium-albuterol  3 mL Nebulization TID   methocarbamol  500 mg Oral TID   nicotine  21 mg Transdermal Daily   pantoprazole  40 mg Oral BID   predniSONE  40 mg Oral Q breakfast   sodium chloride flush  3 mL Intravenous Q12H   Continuous Infusions:  cefTRIAXone (ROCEPHIN)  IV Stopped (12/26/23 1615)     LOS: 3 days   Time spent: 25 minutes   Noralee Stain, DO [facility] 12/27/2023, 11:50 AM   Available via Epic secure chat 7am-7pm After these hours, please refer to coverage provider listed on [email or web address]

## 2023-12-27 NOTE — Progress Notes (Signed)
   12/27/23 1617  Spiritual Encounters  Type of Visit Follow up  Care provided to: Pt and family  Reason for visit Routine spiritual support  OnCall Visit No   Returned visit with patient, patient says she is having anxiety, does not want visit, wants meds. Notified nurse
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
## 2023-12-28 DIAGNOSIS — J441 Chronic obstructive pulmonary disease with (acute) exacerbation: Secondary | ICD-10-CM | POA: Diagnosis not present

## 2023-12-28 MED ORDER — PREDNISONE 10 MG PO TABS: ORAL_TABLET | ORAL | 0 refills | Status: DC

## 2023-12-28 MED ORDER — HYDROCODONE-ACETAMINOPHEN 5-325 MG PO TABS: 1.0000 | ORAL_TABLET | Freq: Three times a day (TID) | ORAL | 0 refills | Status: DC | PRN

## 2023-12-28 MED ORDER — FLUTICASONE FUROATE-VILANTEROL 200-25 MCG/ACT IN AEPB: 1.0000 | INHALATION_SPRAY | Freq: Every day | RESPIRATORY_TRACT | 0 refills | Status: DC

## 2023-12-28 MED ORDER — NICOTINE 21 MG/24HR TD PT24: 21.0000 mg | MEDICATED_PATCH | Freq: Every day | TRANSDERMAL | 0 refills | Status: AC
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
## 2023-12-28 NOTE — Care Management Important Message (Signed)
 Important Message  Patient Details IM Letter given Name: [name] MRN: [id number] Date of Birth: [birth year]   Important Message Given:  Yes - Medicare IM     Caren Macadam 12/28/2023, 1:04 PM

## 2023-12-28 NOTE — Progress Notes (Addendum)
 Patient ambulated in hallway without oxygen. Saturations were 83% on room air; returned to room and replaced nasal cannula with 1.5L; Patient's oxygen level recovered to 99%.

## 2023-12-28 NOTE — TOC Transition Note (Signed)
 Transition of Care [facility]) - Discharge Note   Patient Details  Name: [name] MRN: [id number] Date of Birth: January 20, 1961  Transition of [facility] CM/SW Contact:  Diona Browner, LCSW Phone Number: 12/28/2023, 12:56 PM   Clinical Narrative:    Pt medically ready to d/c home. Pt requiring O2. O2 ordered through [employer]. No further TOC needs.    Final next level of care: Home w Home Health Services Barriers to Discharge: Barriers Resolved   Patient Goals and CMS Choice Patient states their goals for this hospitalization and ongoing recovery are:: return home CMS Medicare.gov Compare Post Acute Care list provided to::  (NA) Choice offered to / list presented to : NA [place] ownership interest in [facility].provided to::  (NA)    Discharge Placement                       Discharge Plan and Services Additional resources added to the After Visit Summary for                  DME Arranged: Oxygen DME Agency: [employer] Date DME Agency Contacted: 12/28/23 Time DME Agency Contacted: [phone] Representative spoke with at DME Agency: Aprl            Social Drivers of Health (SDOH) Interventions SDOH Screenings   Food Insecurity: No Food Insecurity (12/24/2023)  Housing: Low Risk  (12/24/2023)  Transportation Needs: No Transportation Needs (12/24/2023)  Utilities: Not At Risk (12/24/2023)  Depression (PHQ2-9): Low Risk  (11/02/2022)  Tobacco Use: High Risk (12/24/2023)     Readmission Risk Interventions     No data to display

## 2023-12-28 NOTE — Evaluation (Signed)
 Occupational Therapy Evaluation Patient Details Name: [name] MRN: [id number] DOB: 05/31/1961 Today's Date: 12/28/2023   History of Present Illness   Pt is a 63 yo female admitted 12/24/23 with SOB. CT demonstrates 3.6 cm RUL mass with additional lung nodules and adenopathy. PMH: chronic hypoxic and hypercarbic respiratory failure, depression, anxiety, chronic back pain,ACDF,arthritis.     Clinical Impressions PTA, patient was living independently with family close by. Patient currently presents with O2 dependency, decreased activity tolerance, pain, strength and balance deficits impacting BADL's and functional mobility. Patient requires ongoing skilled OT services while in Acute setting and recommending HHOT and a 3 in 1 commode for safe discharge home.     If plan is discharge home, recommend the following:   A little help with walking and/or transfers;A little help with bathing/dressing/bathroom;Assistance with cooking/housework;Help with stairs or ramp for entrance;Assist for transportation     Functional Status Assessment   Patient has had a recent decline in their functional status and demonstrates the ability to make significant improvements in function in a reasonable and predictable amount of time.     Equipment Recommendations   BSC/3in1      Precautions/Restrictions   Precautions Precautions: Fall Precaution/Restrictions Comments: on O2 Restrictions Weight Bearing Restrictions Per Provider Order: No     Mobility Bed Mobility Overal bed mobility: Modified Independent                  Transfers Overall transfer level: Needs assistance Equipment used: Rolling walker (2 wheels) Transfers: Sit to/from Stand, Bed to chair/wheelchair/BSC Sit to Stand: Contact guard assist                  Balance Overall balance assessment: Mild deficits observed, not formally tested                                         ADL either  performed or assessed with clinical judgement   ADL Overall ADL's : Needs assistance/impaired Eating/Feeding: Independent   Grooming: Wash/dry face;Wash/dry hands;Oral care;Applying deodorant;Brushing hair;Sitting;Modified independent   Upper Body Bathing: Modified independent;Sitting   Lower Body Bathing: Supervison/ safety;Sit to/from stand;Sitting/lateral leans   Upper Body Dressing : Modified independent;Sitting   Lower Body Dressing: Supervision/safety;Sitting/lateral leans;Sit to/from stand   Toilet Transfer: Supervision/safety;BSC/3in1         Web designer Details (indicate cue type and reason): sponge bathes at baseline Functional mobility during ADLs: Supervision/safety;Contact guard assist;Rolling walker (2 wheels) General ADL Comments:  (cues for O2 management and ECT strategies)     Vision Baseline Vision/History: 1 Wears glasses Ability to See in Adequate Light: 0 Adequate Patient Visual Report: No change from baseline Vision Assessment?: No apparent visual deficits     Perception Perception: Within Functional Limits       Praxis Praxis: WFL       Pertinent Vitals/Pain Pain Assessment Pain Assessment: 0-10 Pain Score: 8  Pain Location:  (chest and neck) Pain Descriptors / Indicators: Aching     Extremity/Trunk Assessment Upper Extremity Assessment Upper Extremity Assessment: Overall WFL for tasks assessed   Lower Extremity Assessment Lower Extremity Assessment: Generalized weakness   Cervical / Trunk Assessment Cervical / Trunk Assessment: Kyphotic   Communication Communication Communication: No apparent difficulties   Cognition Arousal: Alert Behavior During Therapy: WFL for tasks assessed/performed Cognition: No apparent impairments  Following commands: Intact       Cueing  General Comments   Cueing Techniques: Verbal cues  Sats on 2 lts O2 96% at rest and 90% with amb to bathroom  and back   Exercises Exercises: General Upper Extremity (issued foam grasp ball for anxiety and generalized CDP)        Home Living Family/patient expects to be discharged to:: Private residence Living Arrangements: Other relatives Available Help at Discharge: Family;Available PRN/intermittently Type of Home: Mobile home Home Access: Stairs to enter Entrance Stairs-Number of Steps: 4 Entrance Stairs-Rails: Right;Left Home Layout: One level     Bathroom Shower/Tub: Chief Strategy Officer: Standard Bathroom Accessibility: Yes   Home Equipment: None   Additional Comments: sponge bathes at baseline      Prior Functioning/Environment Prior Level of Function : Independent/Modified Independent             Mobility Comments: no AD ADLs Comments: independent    OT Problem List: Decreased strength;Decreased activity tolerance;Impaired balance (sitting and/or standing);Cardiopulmonary status limiting activity;Pain   OT Treatment/Interventions: Self-care/ADL training;Therapeutic exercise;Energy conservation;DME and/or AE instruction;Therapeutic activities;Patient/family education;Balance training      OT Goals(Current goals can be found in the care plan section)   Acute Rehab OT Goals Patient Stated Goal:  (to stop coughing) OT Goal Formulation: With patient Time For Goal Achievement: 01/11/24 Potential to Achieve Goals: Good ADL Goals Pt Will Perform Lower Body Bathing: with set-up Pt Will Perform Lower Body Dressing: with set-up;sitting/lateral leans;sit to/from stand Pt Will Transfer to Toilet: with supervision Additional ADL Goal #1: Patient will identify 3/3 ECT strategies for BADL's without cues   OT Frequency:  Min 2X/week       AM-PAC OT "6 Clicks" Daily Activity     Outcome Measure Help from another person eating meals?: None Help from another person taking care of personal grooming?: None Help from another person toileting, which includes  using toliet, bedpan, or urinal?: A Little Help from another person bathing (including washing, rinsing, drying)?: A Little Help from another person to put on and taking off regular upper body clothing?: None Help from another person to put on and taking off regular lower body clothing?: A Little 6 Click Score: 21   End of Session Equipment Utilized During Treatment: Gait belt;Rolling walker (2 wheels);Oxygen Nurse Communication: Mobility status;Patient requests pain meds  Activity Tolerance: Patient limited by fatigue;Patient limited by pain Patient left: in bed;with call bell/phone within reach;with bed alarm set  OT Visit Diagnosis: Unsteadiness on feet (R26.81);Muscle weakness (generalized) (M62.81);Pain                Time: 1610-9604 OT Time Calculation (min): 28 min Charges:  OT General Charges $OT Visit: 1 Visit OT Evaluation $OT Eval Low Complexity: 1 Low OT Treatments $Self Care/Home Management : 8-22 mins Zeena Starkel OT/L Acute Rehabilitation Department  [phone]  12/28/2023, 10:05 AM

## 2023-12-28 NOTE — Discharge Summary (Addendum)
 Physician Discharge Summary  [name] UJW:[id number] DOB: 02-15-1961 DOA: 12/24/2023  PCP: Kristian Covey, MD  Admit date: 12/24/2023 Discharge date: 12/28/2023  Admitted From: Home Disposition:  Home with home health   Recommendations for Outpatient Follow-up:  Follow up with PCP in 1 week Follow up with pulmonology for outpatient PET scan and bronchoscopy Repeat CBC outpatient to ensure resolution of leukocytosis in setting of steroid use   Discharge Condition: Stable CODE STATUS: Full code Diet recommendation: Regular diet  Brief/Interim Summary: [name] is a 63 y.o. female with medical history significant for COPD, chronic hypoxic and hypercarbic respiratory failure, depression, anxiety, chronic back pain, and current smoker with presents with SOB. CT demonstrates 3.6 cm RUL mass with additional lung nodules and adenopathy.  Pulmonology was consulted.  Patient was treated for COPD exacerbation with steroids and breathing treatment as well as antibiotics.  She will follow-up with pulmonology outpatient for new right hilar lung mass workup.  She will also follow-up with outpatient palliative care.  Discharge Diagnoses:   Principal Problem:   COPD with acute exacerbation (HCC) Active Problems:   Chronic low back pain   Generalized anxiety disorder   Acute on chronic respiratory failure with hypoxia and hypercapnia (HCC)   Lung mass   COPD exacerbation with acute on chronic hypoxic and hypercarbic respiratory failure -COVID, influenza, RSV negative -Continue prednisone, breathing treatments for discharge on prednisone taper -Ceftriaxone, received 5 day course  -Home O2 DME ordered    New right hilar mass -3.8 cm RUL mass noted on CT is concerning for primary bronchogenic carcinoma; additional lung nodules and adenopathy noted   -PCCM consulted, planning for outpatient PET scan and bronchoscopy   Tobacco abuse -Nicotine patch   Leukocytosis -In setting of  steroid use.  Improved  Anxiety -Xanax PRN   In agreement with assessment of the pressure ulcer as below:  Pressure Injury 12/24/23 Sacrum Mid Stage 1 -  Intact skin with non-blanchable redness of a localized area usually over a bony prominence. non-blanchable small, round spot (Active)  12/24/23 2045  Location: Sacrum  Location Orientation: Mid  Staging: Stage 1 -  Intact skin with non-blanchable redness of a localized area usually over a bony prominence.  Wound Description (Comments): non-blanchable small, round spot  Present on Admission: Yes  Dressing Type Foam - Lift dressing to assess site every shift 12/28/23 0729       Discharge Instructions  Discharge Instructions     Call MD for:  difficulty breathing, headache or visual disturbances   Complete by: As directed    Call MD for:  extreme fatigue   Complete by: As directed    Call MD for:  persistant dizziness or light-headedness   Complete by: As directed    Call MD for:  persistant nausea and vomiting   Complete by: As directed    Call MD for:  severe uncontrolled pain   Complete by: As directed    Call MD for:  temperature >100.4   Complete by: As directed    Discharge instructions   Complete by: As directed    You were cared for by a hospitalist during your hospital stay. If you have any questions about your discharge medications or the care you received while you were in the hospital after you are discharged, you can call the unit and ask to speak with the hospitalist on call if the hospitalist that took care of you is not available. Once you are discharged, your  primary care physician will handle any further medical issues. Please note that NO REFILLS for any discharge medications will be authorized once you are discharged, as it is imperative that you return to your primary care physician (or establish a relationship with a primary care physician if you do not have one) for your aftercare needs so that they can  reassess your need for medications and monitor your lab values.   Increase activity slowly   Complete by: As directed    No wound care   Complete by: As directed       Allergies as of 12/28/2023       Reactions   Aspirin Other (See Comments)   REACTION: had h. pylori due to too many goody powders. told not to take any more asa        Medication List     TAKE these medications    albuterol 108 (90 Base) MCG/ACT inhaler Commonly known as: VENTOLIN HFA INHALE 2 PUFFS INTO THE LUNGS EVERY 4 HOURS AS NEEDED FOR WHEEZING OR SHORTNESS OF BREATH.   ALPRAZolam 1 MG tablet Commonly known as: XANAX Take 1 mg by mouth 3 (three) times daily.   fluticasone furoate-vilanterol 200-25 MCG/ACT Aepb Commonly known as: Breo Ellipta Inhale 1 puff into the lungs daily.   gabapentin 100 MG capsule Commonly known as: NEURONTIN TAKE 1 CAPSULE (100 MG TOTAL) BY MOUTH THREE TIMES DAILY. What changed: See the new instructions.   HYDROcodone-acetaminophen 5-325 MG tablet Commonly known as: NORCO/VICODIN Take 1 tablet by mouth every 8 (eight) hours as needed for up to 7 days for severe pain (pain score 7-10).   ipratropium-albuterol 0.5-2.5 (3) MG/3ML Soln Commonly known as: DUONEB Take 3 mLs by nebulization every 6 (six) hours as needed.   nicotine 21 mg/24hr patch Commonly known as: NICODERM CQ - dosed in mg/24 hours Place 1 patch (21 mg total) onto the skin daily. Start taking on: December 29, 2023   predniSONE 10 MG tablet Commonly known as: DELTASONE Take 4 tabs for 3 days, then 3 tabs for 3 days, then 2 tabs for 3 days, then 1 tab for 3 days, then 1/2 tab for 4 days.               Durable Medical Equipment  (From admission, onward)           Start     Ordered   12/28/23 1231  For home use only DME oxygen  Once       Question Answer Comment  Length of Need Lifetime   Mode or (Route) Nasal cannula   Liters per Minute 2   Frequency Continuous (stationary and portable  oxygen unit needed)   Oxygen conserving device Yes   Oxygen delivery system Gas      12/28/23 1231   12/28/23 1150  For home use only DME Walker  Once       Question:  Patient needs a walker to treat with the following condition  Answer:  COPD (chronic obstructive pulmonary disease) (HCC)   12/28/23 1149            Follow-up Information     Burchette, Elberta Fortis, MD Follow up.   Specialty: Family Medicine Contact information: [address] [place] [address] [phone]         Bevelyn Ngo, NP Follow up.   Specialty: Pulmonary Disease Contact information: [address] [place] [address] [phone]  Allergies  Allergen Reactions   Aspirin Other (See Comments)    REACTION: had h. pylori due to too many goody powders. told not to take any more asa    Consultations: PCCM Palliative care    Procedures/Studies: CT Angio Chest PE W and/or Wo Contrast Result Date: 12/24/2023 CLINICAL DATA:  Pulmonary embolism (PE) suspected, high prob EXAM: CT ANGIOGRAPHY CHEST WITH CONTRAST TECHNIQUE: Multidetector CT imaging of the chest was performed using the standard protocol during bolus administration of intravenous contrast. Multiplanar CT image reconstructions and MIPs were obtained to evaluate the vascular anatomy. RADIATION DOSE REDUCTION: This exam was performed according to the departmental dose-optimization program which includes automated exposure control, adjustment of the mA and/or kV according to patient size and/or use of iterative reconstruction technique. CONTRAST:  75mL OMNIPAQUE IOHEXOL 350 MG/ML SOLN COMPARISON:  X-ray 12/24/2023, CT 11/15/2021 FINDINGS: Cardiovascular: Satisfactory opacification of the pulmonary arteries to the segmental level. No evidence of pulmonary embolism. Thoracic aorta is nonaneurysmal. Scattered atherosclerotic vascular calcifications of the aorta and coronary arteries. Normal heart size. No  pericardial effusion. Mediastinum/Nodes: 1.3 cm right hilar lymph node (series 11, image 60). 0.9 cm right paratracheal node (series 11, image 37). No axillary or left hilar lymphadenopathy. Thyroid gland, trachea, and esophagus within normal limits. Lungs/Pleura: Perihilar mass in the right upper lobe with irregular margins measures 3.8 x 2.4 x 2.6 cm (series 19, image 53). 7 mm right upper lobe nodule (series 19, image 28). 1.6 cm subpleural nodule the anterior aspect of the right upper lobe (series 19, image 72). Background of emphysema. No pleural effusion or pneumothorax. Upper Abdomen: Cholelithiasis. Advanced abdominal aortic atherosclerosis. Multiple hepatic calcified granulomas. No acute abnormality. Musculoskeletal: Mild superior endplate compression fracture of T6, favored chronic although new since 2023. No suspicious bone lesion identified. No chest wall abnormality. Review of the MIP images confirms the above findings. IMPRESSION: 1. No evidence of pulmonary embolism. 2. Perihilar mass in the right upper lobe measuring up to 3.8 cm, highly suspicious for primary bronchogenic carcinoma. Recommend consultation with pulmonology/multidisciplinary tumor board. 3. Additional bilateral pulmonary nodules measuring up to 1.6 cm in the right upper lobe. Metastatic disease or synchronous malignancies are both considerations. 4. Mildly enlarged right hilar lymph node, highly suspicious for metastatic disease. Mildly prominent right paratracheal lymph node, indeterminate. 5. Mild superior endplate compression fracture of T6, favored chronic although new since 2023. 6. Cholelithiasis. 7. Aortic and coronary artery atherosclerosis (ICD10-I70.0). Electronically Signed   By: Duanne Guess D.O.   On: 12/24/2023 14:40   CT Head Wo Contrast Result Date: 12/24/2023 CLINICAL DATA:  Headache. EXAM: CT HEAD WITHOUT CONTRAST TECHNIQUE: Contiguous axial images were obtained from the base of the skull through the vertex  without intravenous contrast. RADIATION DOSE REDUCTION: This exam was performed according to the departmental dose-optimization program which includes automated exposure control, adjustment of the mA and/or kV according to patient size and/or use of iterative reconstruction technique. COMPARISON:  11/15/2021. FINDINGS: Brain: No evidence of acute infarction, hemorrhage, hydrocephalus, extra-axial collection or mass lesion/mass effect. Vascular: No hyperdense vessel or unexpected calcification. Skull: Normal. Negative for fracture or focal lesion. Sinuses/Orbits: Globes and orbits are unremarkable. Mucosal thickening lines the right maxillary sinus associated with dependent fluid. Mild right sphenoid sinus mucosal thickening. Remaining visualized sinuses are clear. Other: None. IMPRESSION: 1. No intracranial abnormalities. 2. Right maxillary and sphenoid sinus disease. Fluid in the right maxillary sinus can be seen with acute sinusitis in the appropriate clinical setting. Electronically Signed   By:  Amie Portland M.D.   On: 12/24/2023 14:32   DG Chest Port 1 View Result Date: 12/24/2023 CLINICAL DATA:  Dyspnea.  Flu like symptoms. EXAM: PORTABLE CHEST 1 VIEW COMPARISON:  05/01/2023 FINDINGS: Heart size is normal. No pleural fluid or interstitial edema. No airspace consolidation. The lungs appear hyperinflated with coarsened interstitial markings of COPD/emphysema. New perihilar masslike opacity in the right upper lobe measures approximately 3.1 cm. Remote bilateral rib fractures noted. IMPRESSION: 1. New 3.1 cm perihilar masslike opacity in the right upper lobe. Recommend further evaluation with contrast-enhanced chest CT. 2. COPD/emphysema. Electronically Signed   By: Signa Kell M.D.   On: 12/24/2023 13:06       Discharge Exam: Vitals:   12/28/23 1231 12/28/23 1359  BP: 125/65   Pulse: 94   Resp: 18   Temp: 98.4 F (36.9 C)   SpO2: 95% 92%    General: Pt is alert, awake, not in acute  distress Cardiovascular: RRR, S1/S2 +, no edema Respiratory: CTA bilaterally, no wheezing, no rhonchi, no respiratory distress, no conversational dyspnea, on 1.5L O2  Extremities: no edema Psych: Somewhat anxious    The results of significant diagnostics from this hospitalization (including imaging, microbiology, ancillary and laboratory) are listed below for reference.     Microbiology: Recent Results (from the past 240 hours)  Resp panel by RT-PCR (RSV, Flu A&B, Covid) Anterior Nasal Swab     Status: None   Collection Time: 12/24/23 12:52 PM   Specimen: Anterior Nasal Swab  Result Value Ref Range Status   SARS Coronavirus 2 by RT PCR NEGATIVE NEGATIVE Final    Comment: (NOTE) SARS-CoV-2 target nucleic acids are NOT DETECTED.  The SARS-CoV-2 RNA is generally detectable in upper respiratory specimens during the acute phase of infection. The lowest concentration of SARS-CoV-2 viral copies this assay can detect is 138 copies/mL. A negative result does not preclude SARS-Cov-2 infection and should not be used as the sole basis for treatment or other patient management decisions. A negative result may occur with  improper specimen collection/handling, submission of specimen other than nasopharyngeal swab, presence of viral mutation(s) within the areas targeted by this assay, and inadequate number of viral copies(<138 copies/mL). A negative result must be combined with clinical observations, patient history, and epidemiological information. The expected result is Negative.  Fact Sheet for Patients:  BloggerCourse.com  Fact Sheet for Healthcare Providers:  SeriousBroker.it  This test is no t yet approved or cleared by the [place] FDA and  has been authorized for detection and/or diagnosis of SARS-CoV-2 by FDA under an Emergency Use Authorization (EUA). This EUA will remain  in effect (meaning this test can be used) for the  duration of the COVID-19 declaration under Section 564(b)(1) of the Act, 21 U.S.C.section 360bbb-3(b)(1), unless the authorization is terminated  or revoked sooner.       Influenza A by PCR NEGATIVE NEGATIVE Final   Influenza B by PCR NEGATIVE NEGATIVE Final    Comment: (NOTE) The Xpert Xpress SARS-CoV-2/FLU/RSV plus assay is intended as an aid in the diagnosis of influenza from Nasopharyngeal swab specimens and should not be used as a sole basis for treatment. Nasal washings and aspirates are unacceptable for Xpert Xpress SARS-CoV-2/FLU/RSV testing.  Fact Sheet for Patients: BloggerCourse.com  Fact Sheet for Healthcare Providers: SeriousBroker.it  This test is not yet approved or cleared by the [place] FDA and has been authorized for detection and/or diagnosis of SARS-CoV-2 by FDA under an Emergency Use Authorization (EUA). This EUA  will remain in effect (meaning this test can be used) for the duration of the COVID-19 declaration under Section 564(b)(1) of the Act, 21 U.S.C. section 360bbb-3(b)(1), unless the authorization is terminated or revoked.     Resp Syncytial Virus by PCR NEGATIVE NEGATIVE Final    Comment: (NOTE) Fact Sheet for Patients: BloggerCourse.com  Fact Sheet for Healthcare Providers: SeriousBroker.it  This test is not yet approved or cleared by the [place] FDA and has been authorized for detection and/or diagnosis of SARS-CoV-2 by FDA under an Emergency Use Authorization (EUA). This EUA will remain in effect (meaning this test can be used) for the duration of the COVID-19 declaration under Section 564(b)(1) of the Act, 21 U.S.C. section 360bbb-3(b)(1), unless the authorization is terminated or revoked.  Performed at [facility], [address] [place], [place] [address]   MRSA Next Gen by PCR, Nasal     Status: None    Collection Time: 12/25/23  4:49 PM   Specimen: Nasal Mucosa; Nasal Swab  Result Value Ref Range Status   MRSA by PCR Next Gen NOT DETECTED NOT DETECTED Final    Comment: (NOTE) The GeneXpert MRSA Assay (FDA approved for NASAL specimens only), is one component of a comprehensive MRSA colonization surveillance program. It is not intended to diagnose MRSA infection nor to guide or monitor treatment for MRSA infections. Test performance is not FDA approved in patients less than 38 years old. Performed at [facility], [address], [place], [place] [address]      Labs: BNP (last 3 results) No results for input(s): "BNP" in the last 8760 hours. Basic Metabolic Panel: Recent Labs  Lab 12/24/23 1252 12/25/23 0242 12/26/23 0249 12/27/23 0305  NA 133* 137 135 136  K 4.2 4.3 4.6 4.7  CL 94* 95* 95* 95*  CO2 33* 32 33* 30  GLUCOSE 170* 185* 169* 111*  BUN 9 14 31* 25*  CREATININE 0.51 0.68 0.71 0.40*  CALCIUM 8.4* 9.3 8.9 8.9  MG 3.4*  --   --   --    Liver Function Tests: Recent Labs  Lab 12/24/23 1252  AST 12*  ALT 10  ALKPHOS 61  BILITOT 0.6  PROT 7.2  ALBUMIN 3.4*   No results for input(s): "LIPASE", "AMYLASE" in the last 168 hours. No results for input(s): "AMMONIA" in the last 168 hours. CBC: Recent Labs  Lab 12/24/23 1252 12/25/23 0242 12/26/23 0249 12/27/23 0305  WBC 10.6* 10.7* 21.6* 17.2*  NEUTROABS 8.4*  --   --   --   HGB 12.0 12.8 11.2* 11.4*  HCT 38.2 40.1 35.4* 37.8  MCV 101.3* 99.8 100.6* 104.1*  PLT 262 274 285 295   Cardiac Enzymes: No results for input(s): "CKTOTAL", "CKMB", "CKMBINDEX", "TROPONINI" in the last 168 hours. BNP: Invalid input(s): "POCBNP" CBG: No results for input(s): "GLUCAP" in the last 168 hours. D-Dimer No results for input(s): "DDIMER" in the last 72 hours. Hgb A1c No results for input(s): "HGBA1C" in the last 72 hours. Lipid Profile No results for input(s): "CHOL", "HDL", "LDLCALC", "TRIG",  "CHOLHDL", "LDLDIRECT" in the last 72 hours. Thyroid function studies No results for input(s): "TSH", "T4TOTAL", "T3FREE", "THYROIDAB" in the last 72 hours.  Invalid input(s): "FREET3" Anemia work up No results for input(s): "VITAMINB12", "FOLATE", "FERRITIN", "TIBC", "IRON", "RETICCTPCT" in the last 72 hours. Urinalysis    Component Value Date/Time   COLORURINE YELLOW 11/16/2021 0955   APPEARANCEUR CLEAR 11/16/2021 0955   LABSPEC >1.046 (H) 11/16/2021 8295  PHURINE 5.0 11/16/2021 0955   GLUCOSEU NEGATIVE 11/16/2021 0955   HGBUR NEGATIVE 11/16/2021 0955   HGBUR negative 09/20/2007 0927   BILIRUBINUR NEGATIVE 11/16/2021 0955   BILIRUBINUR Negative 07/15/2020 1419   KETONESUR 20 (A) 11/16/2021 0955   PROTEINUR NEGATIVE 11/16/2021 0955   UROBILINOGEN 0.2 07/15/2020 1419   UROBILINOGEN 0.2 07/23/2010 1450   NITRITE NEGATIVE 11/16/2021 0955   LEUKOCYTESUR NEGATIVE 11/16/2021 0955   Sepsis Labs Recent Labs  Lab 12/24/23 1252 12/25/23 0242 12/26/23 0249 12/27/23 0305  WBC 10.6* 10.7* 21.6* 17.2*   Microbiology Recent Results (from the past 240 hours)  Resp panel by RT-PCR (RSV, Flu A&B, Covid) Anterior Nasal Swab     Status: None   Collection Time: 12/24/23 12:52 PM   Specimen: Anterior Nasal Swab  Result Value Ref Range Status   SARS Coronavirus 2 by RT PCR NEGATIVE NEGATIVE Final    Comment: (NOTE) SARS-CoV-2 target nucleic acids are NOT DETECTED.  The SARS-CoV-2 RNA is generally detectable in upper respiratory specimens during the acute phase of infection. The lowest concentration of SARS-CoV-2 viral copies this assay can detect is 138 copies/mL. A negative result does not preclude SARS-Cov-2 infection and should not be used as the sole basis for treatment or other patient management decisions. A negative result may occur with  improper specimen collection/handling, submission of specimen other than nasopharyngeal swab, presence of viral mutation(s) within the areas  targeted by this assay, and inadequate number of viral copies(<138 copies/mL). A negative result must be combined with clinical observations, patient history, and epidemiological information. The expected result is Negative.  Fact Sheet for Patients:  BloggerCourse.com  Fact Sheet for Healthcare Providers:  SeriousBroker.it  This test is no t yet approved or cleared by the [place] FDA and  has been authorized for detection and/or diagnosis of SARS-CoV-2 by FDA under an Emergency Use Authorization (EUA). This EUA will remain  in effect (meaning this test can be used) for the duration of the COVID-19 declaration under Section 564(b)(1) of the Act, 21 U.S.C.section 360bbb-3(b)(1), unless the authorization is terminated  or revoked sooner.       Influenza A by PCR NEGATIVE NEGATIVE Final   Influenza B by PCR NEGATIVE NEGATIVE Final    Comment: (NOTE) The Xpert Xpress SARS-CoV-2/FLU/RSV plus assay is intended as an aid in the diagnosis of influenza from Nasopharyngeal swab specimens and should not be used as a sole basis for treatment. Nasal washings and aspirates are unacceptable for Xpert Xpress SARS-CoV-2/FLU/RSV testing.  Fact Sheet for Patients: BloggerCourse.com  Fact Sheet for Healthcare Providers: SeriousBroker.it  This test is not yet approved or cleared by the [place] FDA and has been authorized for detection and/or diagnosis of SARS-CoV-2 by FDA under an Emergency Use Authorization (EUA). This EUA will remain in effect (meaning this test can be used) for the duration of the COVID-19 declaration under Section 564(b)(1) of the Act, 21 U.S.C. section 360bbb-3(b)(1), unless the authorization is terminated or revoked.     Resp Syncytial Virus by PCR NEGATIVE NEGATIVE Final    Comment: (NOTE) Fact Sheet for  Patients: BloggerCourse.com  Fact Sheet for Healthcare Providers: SeriousBroker.it  This test is not yet approved or cleared by the [place] FDA and has been authorized for detection and/or diagnosis of SARS-CoV-2 by FDA under an Emergency Use Authorization (EUA). This EUA will remain in effect (meaning this test can be used) for the duration of the COVID-19 declaration under Section 564(b)(1) of the Act, 21 U.S.C. section  360bbb-3(b)(1), unless the authorization is terminated or revoked.  Performed at [facility], [address], [place], [place] [address]   MRSA Next Gen by PCR, Nasal     Status: None   Collection Time: 12/25/23  4:49 PM   Specimen: Nasal Mucosa; Nasal Swab  Result Value Ref Range Status   MRSA by PCR Next Gen NOT DETECTED NOT DETECTED Final    Comment: (NOTE) The GeneXpert MRSA Assay (FDA approved for NASAL specimens only), is one component of a comprehensive MRSA colonization surveillance program. It is not intended to diagnose MRSA infection nor to guide or monitor treatment for MRSA infections. Test performance is not FDA approved in patients less than 46 years old. Performed at [facility], [address], [place], [place] [address]      Patient was seen and examined on the day of discharge and was found to be in stable condition. Time coordinating discharge: 35 minutes including assessment and coordination of care, as well as examination of the patient.   SIGNED:  Noralee Stain, DO [facility] 12/28/2023, 2:16 PM

## 2023-12-28 NOTE — Plan of Care (Signed)

## 2023-12-29 ENCOUNTER — Telehealth: Payer: Self-pay

## 2023-12-29 NOTE — Transitions of Care (Post Inpatient/ED Visit) (Signed)
   12/29/2023  Name: [name] MRN: [id number] DOB: 24-Jun-1961  Today's TOC FU Call Status: Today's TOC FU Call Status:: Unsuccessful Call (1st Attempt) Unsuccessful Call (1st Attempt) Date: 12/29/23  Attempted to reach the patient regarding the most recent Inpatient/ED visit.  Follow Up Plan: Additional outreach attempts will be made to reach the patient to complete the Transitions of Care (Post Inpatient/ED visit) call.   Lonia Chimera, RN, BSN, CEN [employer]- Transition of Care Team.  [facility] [phone]

## 2023-12-29 NOTE — Transitions of Care (Post Inpatient/ED Visit) (Signed)
 12/29/2023  Name: [name] MRN: [id number] DOB: [birth year]  Today's TOC FU Call Status: Today's TOC FU Call Status:: Successful TOC FU Call Completed TOC FU Call Complete Date: 12/29/23 Patient's Name and Date of Birth confirmed.  Transition Care Management Follow-up Telephone Call Discharge Facility: [facility]) Type of Discharge: Inpatient Admission How have you been since you were released from the hospital?: Better (Breathing is not any better. Has not picked up Rx.) Any questions or concerns?: No  Items Reviewed: Did you receive and understand the discharge instructions provided?: Yes Medications obtained,verified, and reconciled?: Yes (Medications Reviewed) Any new allergies since your discharge?: No Dietary orders reviewed?: NA Do you have support at home?: Yes People in Home: sibling(s) Name of Support/Comfort Primary Source: sister [name]  Medications Reviewed Today: Medications Reviewed Today     Reviewed by Earlie Server, RN (Registered Nurse) on 12/29/23 at 1305  Med List Status: <None>   Medication Order Taking? Sig Documenting Provider Last Dose Status Informant  albuterol (VENTOLIN HFA) 108 (90 Base) MCG/ACT inhaler [id number] Yes INHALE 2 PUFFS INTO THE LUNGS EVERY 4 HOURS AS NEEDED FOR WHEEZING OR SHORTNESS OF BREATH. Kristian Covey, MD Taking Active Self, Pharmacy Records           Med Note Vickii Chafe Dec 24, 2023  5:01 PM) LF: 12/14/23 for a 30ds  ALPRAZolam Prudy Feeler) 1 MG tablet [id number] Yes Take 1 mg by mouth 3 (three) times daily. [provider] Taking Active Self, Pharmacy Records           Med Note Vickii Chafe Dec 24, 2023  5:01 PM) LF: 08/21/23 for a 15ds  fluticasone furoate-vilanterol (BREO ELLIPTA) 200-25 MCG/ACT AEPB [id number] Yes Inhale 1 puff into the lungs daily. Noralee Stain, DO Taking Active   gabapentin (NEURONTIN) 100 MG capsule [id number] Yes TAKE 1 CAPSULE (100 MG TOTAL) BY  MOUTH THREE TIMES DAILY.  Patient taking differently: Take 100 mg by mouth 2 (two) times daily.   Ranelle Oyster, MD Taking Active Self, Pharmacy Records           Med Note Jory Ee, Berniece Salines Dec 24, 2023  5:01 PM) LF: 12/18/23 for a 30ds  HYDROcodone-acetaminophen (NORCO/VICODIN) 5-325 MG tablet [id number] Yes Take 1 tablet by mouth every 8 (eight) hours as needed for up to 7 days for severe pain (pain score 7-10). Noralee Stain, DO Taking Active   ipratropium-albuterol (DUONEB) 0.5-2.5 (3) MG/3ML SOLN [id number] Yes Take 3 mLs by nebulization every 6 (six) hours as needed. Charlott Holler, MD Taking Active Self, Pharmacy Records           Med Note Vickii Chafe Dec 24, 2023  5:01 PM) LF: 12/19/23 for a 30ds  nicotine (NICODERM CQ - DOSED IN MG/24 HOURS) 21 mg/24hr patch [id number] Yes Place 1 patch (21 mg total) onto the skin daily. Noralee Stain, DO Taking Active   predniSONE (DELTASONE) 10 MG tablet [id number] Yes Take 4 tabs for 3 days, then 3 tabs for 3 days, then 2 tabs for 3 days, then 1 tab for 3 days, then 1/2 tab for 4 days. Noralee Stain, DO Taking Active             Home Care and Equipment/Supplies: Were Home Health Services Ordered?: No Any new equipment or medical supplies ordered?: Yes Name of Medical supply agency?: [employer]. Bringing out a new tank today Were  you able to get the equipment/medical supplies?: Yes Do you have any questions related to the use of the equipment/supplies?: No  Functional Questionnaire: Do you need assistance with bathing/showering or dressing?: No Do you need assistance with meal preparation?: No Do you need assistance with eating?: No Do you have difficulty maintaining continence: No Do you need assistance with getting out of bed/getting out of a chair/moving?: No Do you have difficulty managing or taking your medications?: No  Follow up appointments reviewed: PCP Follow-up appointment confirmed?: Yes Date of PCP  follow-up appointment?: 01/01/24 Follow-up Provider: Dr. Caryl Never Specialist [facility] Follow-up appointment confirmed?: Yes Date of Specialist follow-up appointment?: 01/28/24 Follow-Up Specialty Provider:: pain management Do you need transportation to your follow-up appointment?: No Do you understand care options if your condition(s) worsen?: Yes-patient verbalized understanding  SDOH Interventions Today    Flowsheet Row Most Recent Value  SDOH Interventions   Food Insecurity Interventions Intervention Not Indicated  Housing Interventions Intervention Not Indicated  Transportation Interventions Intervention Not Indicated  Utilities Interventions Intervention Not Indicated      Interventions Today    Flowsheet Row Most Recent Value  Chronic Disease   Chronic disease during today's visit Chronic Obstructive Pulmonary Disease (COPD), Other  [new lung mass]  General Interventions   General Interventions Discussed/Reviewed General Interventions Discussed, General Interventions Reviewed  [reviewed importance of smoking cessation]  Education Interventions   Education Provided Provided Education  [encouraged patient to see PCP and follow up with specialist. Encouraged patient to use her nicotine patches to stop smoking.]  Provided Verbal Education On Nutrition  [Encouraged patient to eat healthy and stay hydrated.]  Nutrition Interventions   Nutrition Discussed/Reviewed Nutrition Discussed, Fluid intake  Pharmacy Interventions   Pharmacy Dicussed/Reviewed Medications and their functions  [Encouraged patient to call PCP for any problems or concerns.]  Safety Interventions   Safety Discussed/Reviewed Fall Risk, Home Safety  Advanced Directive Interventions   Advanced Directives Discussed/Reviewed --  [Reviewed pallative care with patient.]      TOC Interventions Today    Flowsheet Row Most Recent Value  TOC Interventions   TOC Interventions Discussed/Reviewed TOC Interventions  Discussed, TOC Interventions Reviewed, Post discharge activity limitations per provider, S/S of infection  [patient has spoken with [employer] about her oxygen,]      Patient continues to have shortness of breath.  Reports the shocking news of a lung mass.  Reports that she continues to smoke but is trying to stop.  Reports that she continues to have pain and does not have pain medications that she wants. She has follow up with the pain clinic next month.   Reviewed and offered 30 day TOC program and patient declines. Provided my contact information for patient to call me if needed.   Lonia Chimera, RN, BSN, CEN [employer]- Transition of Care Team.  [facility] [phone]

## 2023-12-29 NOTE — Telephone Encounter (Signed)
 Called the pt to inform them of their pet scan. The pt is aware and is good with the pet and follow up with Kandice Robinsons.

## 2023-12-29 NOTE — Telephone Encounter (Signed)
 Pt has been discharged home and is calling to schedule a PET. Message sent to [facility]. Will call patient back with update once received.

## 2024-01-01 ENCOUNTER — Telehealth: Payer: Self-pay

## 2024-01-01 ENCOUNTER — Encounter: Payer: Self-pay | Admitting: Family Medicine

## 2024-01-01 ENCOUNTER — Ambulatory Visit: Admitting: Family Medicine

## 2024-01-01 VITALS — BP 140/84 | HR 80 | Temp 97.4°F | Wt 142.6 lb

## 2024-01-01 DIAGNOSIS — J441 Chronic obstructive pulmonary disease with (acute) exacerbation: Secondary | ICD-10-CM

## 2024-01-01 DIAGNOSIS — R739 Hyperglycemia, unspecified: Secondary | ICD-10-CM

## 2024-01-01 DIAGNOSIS — M545 Low back pain, unspecified: Secondary | ICD-10-CM

## 2024-01-01 DIAGNOSIS — G8929 Other chronic pain: Secondary | ICD-10-CM

## 2024-01-01 DIAGNOSIS — R918 Other nonspecific abnormal finding of lung field: Secondary | ICD-10-CM

## 2024-01-01 LAB — POCT GLYCOSYLATED HEMOGLOBIN (HGB A1C): Hemoglobin A1C: 6.1 % — AB (ref 4.0–5.6)

## 2024-01-01 NOTE — Progress Notes (Signed)
 Established Patient Office Visit  Subjective   Patient ID: [name], female    DOB: 1961-03-25  Age: 63 y.o. MRN: [id number]  Chief Complaint  Patient presents with   Hospitalization Follow-up    HPI   [name] is seen for hospital follow-up.  She has history of migraine headaches, longstanding nicotine use, COPD, GERD, chronic low back pain, history of recurrent depression, chronic anxiety, history of cervical spinal fusion with chronic cervical neck pain.  She presented ER on 16 March with some increasing shortness of breath.  CT scan showed 3.6 cm right upper lobe mass with some additional lung nodules and adenopathy.  Pulmonary consult obtained.  Treated for COPD exacerbation with steroids and nebulizers as well as antibiotics.  She has pending PET scan for April 4 and follow-up with pulmonary a week later.  She has been having some anxiety issues regarding her recent diagnosis.  Longstanding history of nicotine use.  Currently using nicotine patch.  Several recent elevated glucoses but these were mostly nonfasting and patient currently on prednisone.  She was prescribed Breo inhaler but never got filled because of cost.  She does have DuoNeb at home which she uses as needed up to every 6 hours.  She has home oxygen.  She states that her O2 sat are usually around 95% but can drop into the upper 80s with walking and activity.  Long history of chronic pain.  She has pending follow-up with pain management April 17.  Currently taking hydrocodone.  She had chest x-rays back in May and July 2024 which did not show any obvious mass.  Recent CT head showed no evidence for obvious mets.  She states her current appetite and weight are stable.  Recent elevated white count likely related to steroids.  Denies any fever.  No hemoptysis.  Past Medical History:  Diagnosis Date   Arthritis    cervical spine   ASTHMA UNSPECIFIED WITH EXACERBATION 07/21/2010   CARPAL TUNNEL SYNDROME, BILATERAL  01/16/2008   COPD 05/08/2008   Dyspnea    intermittent. worsens with anxiety   GERD 04/18/2007   HELICOBACTER PYLORI GASTRITIS, HX OF 04/18/2007   PANIC DISORDER 04/18/2007   TOBACCO ABUSE 07/17/2009   Past Surgical History:  Procedure Laterality Date   ANTERIOR CERVICAL DECOMP/DISCECTOMY FUSION N/A 11/19/2021   Procedure: CERVICAL FOUR-FIVE, CERVICAL FIVE-SIX, CERVICAL SIX-SEVEN ANTERIOR CERVICAL DECOMPRESSION/DISCECTOMY FUSION;  Surgeon: Tia Alert, MD;  Location: [facility] OR;  Service: Neurosurgery;  Laterality: N/A;   APPENDECTOMY     OOPHORECTOMY     POSTERIOR CERVICAL FUSION/FORAMINOTOMY N/A 03/25/2022   Procedure: Cervical Three-Cervical Seven Posterior Cervical Fusion with Lateral Mass Fixation;  Surgeon: Tia Alert, MD;  Location: [facility] OR;  Service: Neurosurgery;  Laterality: N/A;    reports that she has been smoking cigarettes. She started smoking about 50 years ago. She has a 74.4 pack-year smoking history. She has never used smokeless tobacco. She reports that she does not drink alcohol and does not use drugs. family history includes Breast cancer in her paternal aunt; Cancer in her father; Clotting disorder in her brother; Diabetes in her mother; Heart disease in her father; Stomach cancer in her paternal uncle. Allergies  Allergen Reactions   Aspirin Other (See Comments)    REACTION: had h. pylori due to too many goody powders. told not to take any more asa    Review of Systems  Constitutional:  Negative for chills, fever and weight loss.  Respiratory:  Positive for shortness of  breath. Negative for cough and hemoptysis.   Cardiovascular:  Negative for chest pain.  Genitourinary:  Negative for dysuria.  Musculoskeletal:  Positive for back pain and neck pain.      Objective:     BP (!) 140/84   Pulse 80   Temp (!) 97.4 F (36.3 C) (Oral)   Wt 142 lb 9.6 oz (64.7 kg)   SpO2 95%   BMI 23.73 kg/m    Physical Exam Vitals reviewed.  Constitutional:       General: She is not in acute distress.    Appearance: She is not ill-appearing.  Cardiovascular:     Rate and Rhythm: Normal rate and regular rhythm.  Pulmonary:     Comments: Somewhat diminished breath sounds throughout.  She has a few faint expiratory wheezes.  O2 sat 95% on 2 L nasal cannula Musculoskeletal:     Right lower leg: No edema.     Left lower leg: No edema.  Neurological:     Mental Status: She is alert.      Results for orders placed or performed in visit on 01/01/24  POC HgB A1c  Result Value Ref Range   Hemoglobin A1C 6.1 (A) 4.0 - 5.6 %   HbA1c POC (<> result, manual entry)     HbA1c, POC (prediabetic range)     HbA1c, POC (controlled diabetic range)      Last CBC Lab Results  Component Value Date   WBC 17.2 (H) 12/27/2023   HGB 11.4 (L) 12/27/2023   HCT 37.8 12/27/2023   MCV 104.1 (H) 12/27/2023   MCH 31.4 12/27/2023   RDW 12.1 12/27/2023   PLT 295 12/27/2023   Last metabolic panel Lab Results  Component Value Date   GLUCOSE 111 (H) 12/27/2023   NA 136 12/27/2023   K 4.7 12/27/2023   CL 95 (L) 12/27/2023   CO2 30 12/27/2023   BUN 25 (H) 12/27/2023   CREATININE 0.40 (L) 12/27/2023   GFRNONAA >60 12/27/2023   CALCIUM 8.9 12/27/2023   PROT 7.2 12/24/2023   ALBUMIN 3.4 (L) 12/24/2023   BILITOT 0.6 12/24/2023   ALKPHOS 61 12/24/2023   AST 12 (L) 12/24/2023   ALT 10 12/24/2023   ANIONGAP 11 12/27/2023   Last lipids Lab Results  Component Value Date   CHOL 219 (H) 04/29/2020   HDL 77 04/29/2020   LDLCALC 125 (H) 04/29/2020   LDLDIRECT 161.0 07/31/2018   TRIG 80 04/29/2020   CHOLHDL 2.8 04/29/2020   Last hemoglobin A1c Lab Results  Component Value Date   HGBA1C 6.1 (A) 01/01/2024   Last thyroid functions Lab Results  Component Value Date   TSH 1.19 04/29/2020      The ASCVD Risk score (Arnett DK, et al., 2019) failed to calculate for the following reasons:   Cannot find a previous HDL lab   Cannot find a previous total  cholesterol lab    Assessment & Plan:   #1 recently diagnosed right upper lobe 3.6 cm lung mass.  Worrisome for primary lung cancer.  PET scan and pulmonary consult pending.  Patient is naturally anxious to get more information regarding this.  She is aware this likely represents primary lung cancer.  #2 COPD with recent exacerbation.  Currently stable.  Finishing prednisone taper.  She has DuoNeb at home.  States she is unable to afford Breo inhaler.  Has home O2 to use as needed.  Pending follow-up with pulmonary in a couple of weeks  #3 hyperglycemia.  A1c 6.1%.  Probably exacerbated by recent steroids.  Avoid excessive intake of high glycemic foods  #4 chronic neck and low back pain.  She has pending follow-up with new pain management provider April 17  #5 recent leukocytosis.  We did not recheck CBC today since she is still on prednisone and likely will still have some leukocytosis regarding that.  Consider repeating after off steroids.  No follow-ups on file.    Evelena Peat, MD

## 2024-01-01 NOTE — Telephone Encounter (Signed)
 Error

## 2024-01-03 ENCOUNTER — Other Ambulatory Visit: Payer: Self-pay | Admitting: Family Medicine

## 2024-01-03 ENCOUNTER — Telehealth: Payer: Self-pay

## 2024-01-03 NOTE — Telephone Encounter (Signed)
 Copied from CRM [id number]. Topic: Clinical - Medication Refill >> Jan 03, 2024 10:27 AM Florestine Avers wrote: Most Recent Primary Care Visit:  Provider: Kristian Covey  Department: [facility]  Visit Type: OFFICE VISIT  Date: 05/01/2023  Medication: Oxycodone every 4 hour (patient was getting that prescribed at the hospital) Patient was prescribed HYDROcodone-acetaminophen (NORCO/VICODIN) 5-325 MG tablet and she said that its not working the same way that the Oxy was working and she wants to know if Dr. Caryl Never will fill her an oxy script until her pain management appointment which she has coming up.  Has the patient contacted their pharmacy? No (Agent: If no, request that the patient contact the pharmacy for the refill. If patient does not wish to contact the pharmacy document the reason why and proceed with request.) (Agent: If yes, when and what did the pharmacy advise?)  Is this the correct pharmacy for this prescription? Yes If no, delete pharmacy and type the correct one.  This is the patient's preferred pharmacy:  [id number] [place] [place] - [address] [place] [address] Phone: [phone] Fax: [phone]  [facility] [address] [place] [address] Phone: [phone] Fax: [phone]   Has the prescription been filled recently? No  Is the patient out of the medication? Yes  Has the patient been seen for an appointment in the last year OR does the patient have an upcoming appointment? Yes  Can we respond through MyChart? Yes  Agent: Please be advised that Rx refills may take up to 3 business days. We ask that you follow-up with your pharmacy.

## 2024-01-03 NOTE — Telephone Encounter (Signed)
 Copied from CRM [id number]. Topic: Clinical - Home Health Verbal Orders >> Jan 03, 2024  1:13 PM Marica Otter wrote: Caller/Agency: Monique/[facility] Callback Number: [phone] Secure line Service Requested: Physical Therapy Frequency: 2x week for 4 weeks Any new concerns about the patient? No

## 2024-01-03 NOTE — Telephone Encounter (Signed)
Spoke with [place] and informed her of the approval as below.

## 2024-01-04 ENCOUNTER — Telehealth: Payer: Self-pay | Admitting: *Deleted

## 2024-01-04 MED ORDER — HYDROCODONE-ACETAMINOPHEN 5-325 MG PO TABS: 1.0000 | ORAL_TABLET | Freq: Three times a day (TID) | ORAL | 0 refills | Status: DC | PRN

## 2024-01-04 NOTE — Telephone Encounter (Signed)
 Copied from CRM [id number]. Topic: General - Other >> Jan 02, 2024  2:54 PM Alcus Dad wrote: Reason for CRM: Monique (PT) from [facility] needs verbal ok from Dr. Caryl Never for patient PT 2x for 2 weeks and 1x for 4weeks. [phone]

## 2024-01-04 NOTE — Telephone Encounter (Signed)
 We agree to refill only until she gets in to see pain management.  She has follow up with them on April 17 and needs to keep that appt.   Kristian Covey MD [facility]

## 2024-01-05 NOTE — Telephone Encounter (Signed)
 Monique informed of the ok for VO

## 2024-01-10 ENCOUNTER — Telehealth: Payer: Self-pay

## 2024-01-10 NOTE — Telephone Encounter (Signed)
 Copied from CRM [id number]. Topic: Clinical - Medication Question >> Jan 10, 2024  1:45 PM [name] wrote: Reason for CRM: pt states she believes the HYDROcodone-acetaminophen (NORCO/VICODIN) 5-325 MG tablet is upsetting her stomach and states she was on Oxycodone at the hospital and states she would like to try that again and would like to see if the Dr will call it in.

## 2024-01-11 ENCOUNTER — Encounter (HOSPITAL_BASED_OUTPATIENT_CLINIC_OR_DEPARTMENT_OTHER)

## 2024-01-11 ENCOUNTER — Telehealth: Payer: Self-pay | Admitting: *Deleted

## 2024-01-11 MED ORDER — OXYCODONE-ACETAMINOPHEN 5-325 MG PO TABS
1.0000 | ORAL_TABLET | Freq: Four times a day (QID) | ORAL | 0 refills | Status: DC | PRN
Start: 2024-01-11 — End: 2024-01-16

## 2024-01-11 NOTE — Telephone Encounter (Signed)
 Copied from CRM [id number]. Topic: General - Other >> Jan 11, 2024 12:58 PM Gurney Maxin H wrote: Reason for CRM: Patient returning call to [facility], please reach out to patient, thanks.  Kaysa [phone]

## 2024-01-11 NOTE — Telephone Encounter (Signed)
 Please see previous encounter

## 2024-01-11 NOTE — Telephone Encounter (Signed)
Patient informed rx was sent 

## 2024-01-11 NOTE — Telephone Encounter (Signed)
 Left message for patient to return my call.

## 2024-01-11 NOTE — Addendum Note (Signed)
 Addended by: Kristian Covey on: 01/11/2024 12:43 PM   Modules accepted: Orders

## 2024-01-11 NOTE — Telephone Encounter (Signed)
 I sent in Oxycodone.  We can only refill this until she gets in to see chronic pain management next week.   Kristian Covey MD [facility] at [facility]

## 2024-01-11 NOTE — Telephone Encounter (Signed)
Pt is returning your call and want a call back. 

## 2024-01-12 ENCOUNTER — Ambulatory Visit (HOSPITAL_COMMUNITY)
Admission: RE | Admit: 2024-01-12 | Discharge: 2024-01-12 | Disposition: A | Discharge status: Home / Self Care | Source: Ambulatory Visit | Attending: Acute Care | Admitting: Acute Care

## 2024-01-12 DIAGNOSIS — R918 Other nonspecific abnormal finding of lung field: Secondary | ICD-10-CM | POA: Insufficient documentation

## 2024-01-12 LAB — GLUCOSE, CAPILLARY: Glucose-Capillary: 106 mg/dL — ABNORMAL HIGH (ref 70–99)

## 2024-01-12 MED ORDER — FLUDEOXYGLUCOSE F - 18 (FDG) INJECTION
7.0500 | Freq: Once | INTRAVENOUS | Status: AC
Start: 2024-01-12 — End: 2024-01-12
  Administered 2024-01-12: 7.05 via INTRAVENOUS

## 2024-01-16 ENCOUNTER — Other Ambulatory Visit: Payer: Self-pay | Admitting: Family Medicine

## 2024-01-16 NOTE — Telephone Encounter (Signed)
 Copied from CRM [id number]. Topic: Clinical - Medication Refill >> Jan 16, 2024  1:09 PM [name] wrote: Most Recent Primary Care Visit:  Provider: Kristian Covey  Department: [facility]  Visit Type: HOSPITAL FOLLOW UP  Date: 01/01/2024  Medication: oxyCODONE-acetaminophen (PERCOCET/ROXICET) 5-325 MG tablet  Has the patient contacted their pharmacy? Yes (Agent: If no, request that the patient contact the pharmacy for the refill. If patient does not wish to contact the pharmacy document the reason why and proceed with request.) (Agent: If yes, when and what did the pharmacy advise?)  Is this the correct pharmacy for this prescription? Yes If no, delete pharmacy and type the correct one.  This is the patient's preferred pharmacy:  [id number] [place], [place] - [address] [place] [address] Phone: [phone] Fax: [phone]  Has the prescription been filled recently? No  Is the patient out of the medication? Yes  Has the patient been seen for an appointment in the last year OR does the patient have an upcoming appointment? Yes  Can we respond through MyChart? Yes  Agent: Please be advised that Rx refills may take up to 3 business days. We ask that you follow-up with your pharmacy.

## 2024-01-17 ENCOUNTER — Encounter: Payer: Self-pay | Admitting: Emergency Medicine

## 2024-01-17 ENCOUNTER — Encounter: Payer: Self-pay | Admitting: Acute Care

## 2024-01-17 ENCOUNTER — Ambulatory Visit (INDEPENDENT_AMBULATORY_CARE_PROVIDER_SITE_OTHER): Admitting: Acute Care

## 2024-01-17 VITALS — BP 122/76 | HR 99 | Ht 66.0 in | Wt 133.4 lb

## 2024-01-17 DIAGNOSIS — R911 Solitary pulmonary nodule: Secondary | ICD-10-CM | POA: Diagnosis not present

## 2024-01-17 DIAGNOSIS — J449 Chronic obstructive pulmonary disease, unspecified: Secondary | ICD-10-CM | POA: Diagnosis not present

## 2024-01-17 DIAGNOSIS — F17210 Nicotine dependence, cigarettes, uncomplicated: Secondary | ICD-10-CM

## 2024-01-17 DIAGNOSIS — F17200 Nicotine dependence, unspecified, uncomplicated: Secondary | ICD-10-CM

## 2024-01-17 DIAGNOSIS — J9611 Chronic respiratory failure with hypoxia: Secondary | ICD-10-CM

## 2024-01-17 MED ORDER — TRELEGY ELLIPTA 100-62.5-25 MCG/ACT IN AEPB: 1.0000 | INHALATION_SPRAY | Freq: Every day | RESPIRATORY_TRACT | Status: DC

## 2024-01-17 MED ORDER — TRELEGY ELLIPTA 100-62.5-25 MCG/ACT IN AEPB
1.0000 | INHALATION_SPRAY | Freq: Every day | RESPIRATORY_TRACT | Status: DC
Start: 2024-01-17 — End: 2024-01-17

## 2024-01-17 MED ORDER — TRELEGY ELLIPTA 100-62.5-25 MCG/ACT IN AEPB: 1.0000 | INHALATION_SPRAY | Freq: Every day | RESPIRATORY_TRACT | 6 refills | Status: DC

## 2024-01-17 MED ORDER — OXYCODONE-ACETAMINOPHEN 5-325 MG PO TABS: 1.0000 | ORAL_TABLET | Freq: Four times a day (QID) | ORAL | 0 refills | Status: AC | PRN

## 2024-01-17 NOTE — Patient Instructions (Addendum)
 It is good to see you today. Your PET scan did show that the lung mass we are following did show positive uptake on your PET scan. Next step is for biopsy for tissue diagnosis. I have placed an order for a bronchoscopy with biopsies.  We have discussed the procedure in detail.  We have reviewed the risks and benefits of the procedure. These include bleeding, infection, puncture of the lung, and adverse reaction to anesthesia. You have agreed to proceed with biopsy to evaluate the left upper lobe nodule. Your procedure will be done by Dr. Levy Pupa. You will receive a letter today with date time and information pertaining to the procedure. You will need someone to drive you to the procedure, stay with you during the procedure, and stay with you after the procedure. You will also need someone to stay with you for 24 hours after anesthesia to ensure you have cleared and are doing well. You will follow-up with me 1 week after the procedure to review the results and to ensure you are doing well. Call if you need [place] prior to the procedure or if you have any questions at all. Please contact office for sooner follow up if symptoms do not improve or worsen or seek emergency care     Please work on quitting smoking completely. You can receive free nicotine replacement therapy (patches, gum, or mints) by calling 1-800-QUIT NOW. Please call so we can get you on the path to becoming a non-smoker. I know it is hard, but you can do this!  Hypnosis for smoking cessation  [employer] [phone]  Acupuncture for smoking cessation  [employer] [phone]

## 2024-01-17 NOTE — Progress Notes (Signed)
 Trelegy  History of Present Illness [name] is a 63 y.o. female current every day smoker followed by Dr. Celine Mans for COPD, here 12/2023 for follow up of a pulmonary mass found incidentally on an ED Visit. She will be followed by Dr. Delton Coombes for her biopsy.  Pt. Has consented to use of [employer] soft wear to help capture verbal  the content of this OV.   01/17/2024 [name] is a 63 year old female with COPD who presents with an incidental  lung mass found on imaging. She is accompanied by her daughter.  A lung mass was discovered during an emergency room visit for a suspected COPD flare. Imaging, including a CT scan and chest x-ray, revealed a mass in the right upper lobe of the lung. A PET scan was ordered and done 01/12/2024, and  indicated hypermetabolism in the mass, suggesting active growth.  The PET scan confirmed the presence of a hypermetabolic mass in the right upper lobe, measuring three millimeters. Other small areas of nodularity in the right upper lobe appeared smaller and less extensive than they had on the earlier CT Chest, and are suspected to be infectious or inflammatory, but bear close observation moving forward. .  She is currently on one and a half liters of oxygen. Despite efforts to quit, she is continuing to smoke.  She is using  nicotine gum. She has a history of headaches, which she attributes to her COPD and neck issues, and frequently uses Goody powders for relief, which contain aspirin. She denies having sleep apnea, diabetes, or a latex allergy. She is not on blood thinners but has been advised to stop Goody powders before the biopsy due to their aspirin content.  We discussed the biopsy, its risks and benefits. Patient has agreed to move forward with the procedure. Her daughter who is with her today, in in agreement also.   Pt. States she cannot afford the Breo inhaler she was prescribed. We will provide her with financial assistance paperwork, and do a therapeutic  trial of Trelegy to see if this helps with her dyspnea. She she is in agreement with this plan.     Test Results: 01/12/2024 PET Scan IMPRESSION: Hypermetabolic mass seen corresponding to the lesion in the right upper lobe immediately adjacent to the right hilum consistent with a malignant mass measuring 3 cm.   No additional areas of abnormal radiotracer uptake at this time to suggest additional areas of disease.   Small areas of nodularity elsewhere the right upper lobe on the prior examination appears smaller and less extensive today. There is some stable nodular areas at the right lung base. Simple attention on follow-up.   Prominent right paratracheal node on the prior CT scan does not show any abnormal uptake.  12/24/2023 CTA Chest Mediastinum/Nodes: 1.3 cm right hilar lymph node (series 11, image 60). 0.9 cm right paratracheal node (series 11, image 37). No axillary or left hilar lymphadenopathy. Thyroid gland, trachea, and esophagus within normal limits.   Lungs/Pleura: Perihilar mass in the right upper lobe with irregular margins measures 3.8 x 2.4 x 2.6 cm (series 19, image 53). 7 mm right upper lobe nodule (series 19, image 28). 1.6 cm subpleural nodule the anterior aspect of the right upper lobe (series 19, image 72). Background of emphysema. No pleural effusion or pneumothorax.   Upper Abdomen: Cholelithiasis. Advanced abdominal aortic atherosclerosis. Multiple hepatic calcified granulomas. No acute abnormality.   Musculoskeletal: Mild superior endplate compression fracture of T6, favored chronic  although new since 2023. No suspicious bone lesion identified. No chest wall abnormality.   Review of the MIP images confirms the above findings.   IMPRESSION: 1. No evidence of pulmonary embolism. 2. Perihilar mass in the right upper lobe measuring up to 3.8 cm, highly suspicious for primary bronchogenic carcinoma. Recommend consultation with  pulmonology/multidisciplinary tumor board. 3. Additional bilateral pulmonary nodules measuring up to 1.6 cm in the right upper lobe. Metastatic disease or synchronous malignancies are both considerations. 4. Mildly enlarged right hilar lymph node, highly suspicious for metastatic disease. Mildly prominent right paratracheal lymph node, indeterminate. 5. Mild superior endplate compression fracture of T6, favored chronic although new since 2023. 6. Cholelithiasis. 7. Aortic and coronary artery atherosclerosis     Latest Ref Rng & Units 12/27/2023    3:05 AM 12/26/2023    2:49 AM 12/25/2023    2:42 AM  CBC  WBC 4.0 - 10.5 K/uL 17.2  21.6  10.7   Hemoglobin 12.0 - 15.0 g/dL 16.1  09.6  04.5   Hematocrit 36.0 - 46.0 % 37.8  35.4  40.1   Platelets 150 - 400 K/uL 295  285  274        Latest Ref Rng & Units 12/27/2023    3:05 AM 12/26/2023    2:49 AM 12/25/2023    2:42 AM  BMP  Glucose 70 - 99 mg/dL 409  811  914   BUN 8 - 23 mg/dL 25  31  14    Creatinine 0.44 - 1.00 mg/dL 7.82  9.56  2.13   Sodium 135 - 145 mmol/L 136  135  137   Potassium 3.5 - 5.1 mmol/L 4.7  4.6  4.3   Chloride 98 - 111 mmol/L 95  95  95   CO2 22 - 32 mmol/L 30  33  32   Calcium 8.9 - 10.3 mg/dL 8.9  8.9  9.3     BNP No results found for: "BNP"  ProBNP No results found for: "PROBNP"  PFT    Component Value Date/Time   FEV1PRE 1.28 06/07/2018 1610   FVCPRE 2.25 06/07/2018 1610   PREFEV1FVCRT 57 06/07/2018 1610    NM PET Image Initial (PI) Skull Base To Thigh Result Date: 01/16/2024 CLINICAL DATA:  Initial treatment strategy for lung mass. EXAM: NUCLEAR MEDICINE PET SKULL BASE TO THIGH TECHNIQUE: 7.05 mCi F-18 FDG was injected intravenously. Full-ring PET imaging was performed from the skull base to thigh after the radiotracer. CT data was obtained and used for attenuation correction and anatomic localization. Fasting blood glucose: 106 mg/dl COMPARISON:  CTA 08/65/7846. FINDINGS: Mediastinal blood pool  activity: SUV max 2.8 Liver activity: SUV max 3.0 NECK: There is uptake along both sides muscles of mastication, physiologic. No specific abnormal uptake seen in the neck along lymph node change including submandibular, posterior triangle internal jugular region. The visualized portions of the intra compartment has near symmetric uptake. Incidental CT findings: Visualized paranasal sinuses and mastoid air cells are clear. Significant streak artifact related to the spinal fixation hardware both anterior and posteriorly. Vascular calcifications are seen. Grossly the parotid glands, submandibular glands and thyroid gland is unremarkable. No specific abnormal lymph node enlargement identified in the visualized neck. CHEST: Hypermetabolic activity identified corresponding to the mass in the right lung with maximum SUV value of 18.7. This lesion is in the inferior central right upper lobe which is perihilar and again measures approximately 3.0 x 3.0 cm on axial CT image 70. Mild adjacent stranding. No additional areas  of abnormal radiotracer uptake in the thorax including elsewhere along the lung parenchyma. No areas of abnormal uptake above mediastinal blood pool in the axillary region, hilum or mediastinum. Incidental CT findings: Emphysematous lung changes identified. Previously there is some small nodules elsewhere including in the right upper lobe measuring 7 mm. This area appears smaller today. Nodular area anteromedial right upper lobe on the previous measures 16 mm is much smaller today with some minimal opacity on series 4, image 76. No specific abnormal uptake. Minimal radiotracer seen with maximum SUV value of 1.6. This may have been infectious or inflammatory or atelectatic. Persistent nodular areas at the right lung base with some thickening are stable but do not show any abnormal uptake. Coronary calcifications are seen. Heart nonenlarged. Vascular calcifications along the thoracic aorta. ABDOMEN/PELVIS:  There is physiologic distribution radiotracer along the parenchymal organs, bowel and renal collecting systems. Mild uptake along the right diaphragmatic crus is noted, nonspecific. Incidental CT findings: Gallstones. Diffuse vascular calcifications. No bowel obstruction. Scattered colonic stool. Grossly the liver, spleen, adrenal glands and pancreas are unremarkable on this noncontrast examination. Multiple Paddock granulomas are seen. No abnormal calcifications are seen within either kidney nor along the course of either ureter. SKELETON: No specific abnormal uptake identified in the skeleton. Degenerative changes seen along the spine. IMPRESSION: Hypermetabolic mass seen corresponding to the lesion in the right upper lobe immediately adjacent to the right hilum consistent with a malignant mass measuring 3 cm. No additional areas of abnormal radiotracer uptake at this time to suggest additional areas of disease. Small areas of nodularity elsewhere the right upper lobe on the prior examination appears smaller and less extensive today. There is some stable nodular areas at the right lung base. Simple attention on follow-up. Prominent right paratracheal node on the prior CT scan does not show any abnormal uptake. Electronically Signed   By: Karen Kays M.D.   On: 01/16/2024 13:36   CT Angio Chest PE W and/or Wo Contrast Result Date: 12/24/2023 CLINICAL DATA:  Pulmonary embolism (PE) suspected, high prob EXAM: CT ANGIOGRAPHY CHEST WITH CONTRAST TECHNIQUE: Multidetector CT imaging of the chest was performed using the standard protocol during bolus administration of intravenous contrast. Multiplanar CT image reconstructions and MIPs were obtained to evaluate the vascular anatomy. RADIATION DOSE REDUCTION: This exam was performed according to the departmental dose-optimization program which includes automated exposure control, adjustment of the mA and/or kV according to patient size and/or use of iterative  reconstruction technique. CONTRAST:  75mL OMNIPAQUE IOHEXOL 350 MG/ML SOLN COMPARISON:  X-ray 12/24/2023, CT 11/15/2021 FINDINGS: Cardiovascular: Satisfactory opacification of the pulmonary arteries to the segmental level. No evidence of pulmonary embolism. Thoracic aorta is nonaneurysmal. Scattered atherosclerotic vascular calcifications of the aorta and coronary arteries. Normal heart size. No pericardial effusion. Mediastinum/Nodes: 1.3 cm right hilar lymph node (series 11, image 60). 0.9 cm right paratracheal node (series 11, image 37). No axillary or left hilar lymphadenopathy. Thyroid gland, trachea, and esophagus within normal limits. Lungs/Pleura: Perihilar mass in the right upper lobe with irregular margins measures 3.8 x 2.4 x 2.6 cm (series 19, image 53). 7 mm right upper lobe nodule (series 19, image 28). 1.6 cm subpleural nodule the anterior aspect of the right upper lobe (series 19, image 72). Background of emphysema. No pleural effusion or pneumothorax. Upper Abdomen: Cholelithiasis. Advanced abdominal aortic atherosclerosis. Multiple hepatic calcified granulomas. No acute abnormality. Musculoskeletal: Mild superior endplate compression fracture of T6, favored chronic although new since 2023. No suspicious bone lesion identified. No  chest wall abnormality. Review of the MIP images confirms the above findings. IMPRESSION: 1. No evidence of pulmonary embolism. 2. Perihilar mass in the right upper lobe measuring up to 3.8 cm, highly suspicious for primary bronchogenic carcinoma. Recommend consultation with pulmonology/multidisciplinary tumor board. 3. Additional bilateral pulmonary nodules measuring up to 1.6 cm in the right upper lobe. Metastatic disease or synchronous malignancies are both considerations. 4. Mildly enlarged right hilar lymph node, highly suspicious for metastatic disease. Mildly prominent right paratracheal lymph node, indeterminate. 5. Mild superior endplate compression fracture of  T6, favored chronic although new since 2023. 6. Cholelithiasis. 7. Aortic and coronary artery atherosclerosis (ICD10-I70.0). Electronically Signed   By: Duanne Guess D.O.   On: 12/24/2023 14:40   CT Head Wo Contrast Result Date: 12/24/2023 CLINICAL DATA:  Headache. EXAM: CT HEAD WITHOUT CONTRAST TECHNIQUE: Contiguous axial images were obtained from the base of the skull through the vertex without intravenous contrast. RADIATION DOSE REDUCTION: This exam was performed according to the departmental dose-optimization program which includes automated exposure control, adjustment of the mA and/or kV according to patient size and/or use of iterative reconstruction technique. COMPARISON:  11/15/2021. FINDINGS: Brain: No evidence of acute infarction, hemorrhage, hydrocephalus, extra-axial collection or mass lesion/mass effect. Vascular: No hyperdense vessel or unexpected calcification. Skull: Normal. Negative for fracture or focal lesion. Sinuses/Orbits: Globes and orbits are unremarkable. Mucosal thickening lines the right maxillary sinus associated with dependent fluid. Mild right sphenoid sinus mucosal thickening. Remaining visualized sinuses are clear. Other: None. IMPRESSION: 1. No intracranial abnormalities. 2. Right maxillary and sphenoid sinus disease. Fluid in the right maxillary sinus can be seen with acute sinusitis in the appropriate clinical setting. Electronically Signed   By: Amie Portland M.D.   On: 12/24/2023 14:32   DG Chest Port 1 View Result Date: 12/24/2023 CLINICAL DATA:  Dyspnea.  Flu like symptoms. EXAM: PORTABLE CHEST 1 VIEW COMPARISON:  05/01/2023 FINDINGS: Heart size is normal. No pleural fluid or interstitial edema. No airspace consolidation. The lungs appear hyperinflated with coarsened interstitial markings of COPD/emphysema. New perihilar masslike opacity in the right upper lobe measures approximately 3.1 cm. Remote bilateral rib fractures noted. IMPRESSION: 1. New 3.1 cm perihilar  masslike opacity in the right upper lobe. Recommend further evaluation with contrast-enhanced chest CT. 2. COPD/emphysema. Electronically Signed   By: Signa Kell M.D.   On: 12/24/2023 13:06     Past medical hx Past Medical History:  Diagnosis Date   Arthritis    cervical spine   ASTHMA UNSPECIFIED WITH EXACERBATION 07/21/2010   CARPAL TUNNEL SYNDROME, BILATERAL 01/16/2008   COPD 05/08/2008   Dyspnea    intermittent. worsens with anxiety   GERD 04/18/2007   HELICOBACTER PYLORI GASTRITIS, HX OF 04/18/2007   PANIC DISORDER 04/18/2007   TOBACCO ABUSE 07/17/2009     Social History   Tobacco Use   Smoking status: Every Day    Current packs/day: 0.75    Average packs/day: 1.5 packs/day for 50.3 years (74.5 ttl pk-yrs)    Types: Cigarettes    Start date: 1975   Smokeless tobacco: Never   Tobacco comments:    Trying nicotine patches, made her nauseous at first, going to cut patches in half.    1 cigarette a day-01/17/2024  Vaping Use   Vaping status: Never Used  Substance Use Topics   Alcohol use: No    Comment: occasional   Drug use: No    [name] reports that she has been smoking cigarettes. She started smoking about 50 years  ago. She has a 74.5 pack-year smoking history. She has never used smokeless tobacco. She reports that she does not drink alcohol and does not use drugs.  Tobacco Cessation: Ready to quit: Not Answered Counseling given: Not Answered Tobacco comments: Trying nicotine patches, made her nauseous at first, going to cut patches in half. 1 cigarette a day-01/17/2024 Current every day smoker who has cut down significantly, but does still smoke 1-2 cigarettes a day.  Past surgical hx, Family hx, Social hx all reviewed.  Current Outpatient Medications on File Prior to Visit  Medication Sig   albuterol (VENTOLIN HFA) 108 (90 Base) MCG/ACT inhaler INHALE 2 PUFFS INTO THE LUNGS EVERY 4 HOURS AS NEEDED FOR WHEEZING OR SHORTNESS OF BREATH.   ALPRAZolam (XANAX)  1 MG tablet Take 1 mg by mouth 3 (three) times daily.   fluticasone furoate-vilanterol (BREO ELLIPTA) 200-25 MCG/ACT AEPB Inhale 1 puff into the lungs daily. (Patient not taking: Reported on 01/17/2024)   gabapentin (NEURONTIN) 100 MG capsule TAKE 1 CAPSULE (100 MG TOTAL) BY MOUTH THREE TIMES DAILY. (Patient taking differently: Take 100 mg by mouth 2 (two) times daily.)   ipratropium-albuterol (DUONEB) 0.5-2.5 (3) MG/3ML SOLN Take 3 mLs by nebulization every 6 (six) hours as needed.   nicotine (NICODERM CQ - DOSED IN MG/24 HOURS) 21 mg/24hr patch Place 1 patch (21 mg total) onto the skin daily.   oxyCODONE-acetaminophen (PERCOCET/ROXICET) 5-325 MG tablet Take 1 tablet by mouth every 6 (six) hours as needed for up to 5 days for severe pain (pain score 7-10).   predniSONE (DELTASONE) 10 MG tablet Take 4 tabs for 3 days, then 3 tabs for 3 days, then 2 tabs for 3 days, then 1 tab for 3 days, then 1/2 tab for 4 days.   No current facility-administered medications on file prior to visit.     Allergies  Allergen Reactions   Aspirin Other (See Comments)    REACTION: had h. pylori due to too many goody powders. told not to take any more asa    Review Of Systems:  Constitutional:   +  weight loss, night sweats,  Fevers, chills, + fatigue, or  lassitude.  HEENT:   + headaches,  No Difficulty swallowing,  Tooth/dental problems, or  Sore throat,                No sneezing, itching, ear ache, nasal congestion, post nasal drip,   CV:  No chest pain,  Orthopnea, PND, swelling in lower extremities, anasarca, dizziness, palpitations, syncope.   GI  No heartburn, indigestion, abdominal pain, nausea, vomiting, diarrhea, change in bowel habits, loss of appetite, bloody stools.   Resp: + baseline  shortness of breath with exertion less at rest.  + Baseline  excess mucus, + baseline productive cough,  No non-productive cough,  No coughing up of blood.  No change in color of mucus.  + wheezing.  No chest wall  deformity  Skin: no rash or lesions.  GU: no dysuria, change in color of urine, no urgency or frequency.  No flank pain, no hematuria   MS:  No joint pain or swelling.  + decreased range of motion.  + back pain.  Psych:  No change in mood or affect. + depression  and + anxiety.  No memory loss.   Vital Signs BP 122/76 (BP Location: Right Arm, Patient Position: Sitting, Cuff Size: Normal)   Pulse 99   Ht 5\' 6"  (1.676 m)   Wt 133 lb 6.4 oz (60.5  kg)   SpO2 96%   BMI 21.53 kg/m    Physical Exam:  General- No distress,  A&Ox3, pleasant frail female in wheelchair ENT: No sinus tenderness, TM clear, pale nasal mucosa, no oral exudate,no post nasal drip, no LAN Cardiac: S1, S2, regular rate and rhythm, no murmur Chest: + wheeze/ No rales/ dullness; no accessory muscle use, no nasal flaring, no sternal retractions Abd.: Soft Non-tender, ND, BS +Body mass index is 21.53 kg/m.  Ext: No clubbing cyanosis, edema, no obvious deformities Neuro: Physical deconditioning, MAE x 4, A&O x 3 Skin: No rashes, warm and dry, no obvious lesions  Psych: normal mood and behavior   Assessment/Plan Pulmonary nodule Mass in right upper lobe with hypermetabolism on PET scan concerning for malignancy.  Biopsy necessary for tissue diagnosis and treatment planning. - Order bronchoscopy with biopsy under general anesthesia. - Your PET scan did show that the lung mass we are following did show positive uptake on your PET scan. Next step is for biopsy for tissue diagnosis. I have placed an order for a bronchoscopy with biopsies.  We have discussed the procedure in detail.  We have reviewed the risks and benefits of the procedure. These include bleeding, infection, puncture of the lung, and adverse reaction to anesthesia. You have agreed to proceed with biopsy to evaluate the left upper lobe nodule. Your procedure will be done by Dr. Levy Pupa. You will receive a letter today with date time and  information pertaining to the procedure. You will need someone to drive you to the procedure, stay with you during the procedure, and stay with you after the procedure. You will also need someone to stay with you for 24 hours after anesthesia to ensure you have cleared and are doing well. You will follow-up with me 1 week after the procedure to review the results and to ensure you are doing well. Call if you need [place] prior to the procedure or if you have any questions at all. Please contact office for sooner follow up if symptoms do not improve or worsen or seek emergency care     - Ensure she refrains from taking aspirin-containing goody powders prior to procedure. - Schedule follow-up in one week to discuss biopsy results.  Chronic Obstructive Pulmonary Disease (COPD) COPD with recent flare-up. Smoking cessation critical to manage COPD and prevent further lung damage. - Encourage smoking cessation with resources such as hypnosis, acupuncture, and nicotine replacement therapy. - Provide contact for 1-800-QUIT-NOW for free nicotine patches, gum, and mints. - Therapeutic Trial of Trelegy - 1 puff once daily, rinse mouth after use. - We have provided paperwork for financial assistance.  Current Every Day smoker Working hard to quit Counseling done x 3 minutes today in the office. Plan Please work on quitting smoking completely. You can receive free nicotine replacement therapy (patches, gum, or mints) by calling 1-800-QUIT NOW. Please call so we can get you on the path to becoming a non-smoker. I know it is hard, but you can do this!  Hypnosis for smoking cessation  [employer] [phone]  Acupuncture for smoking cessation  [employer] [phone]    I spent 35 minutes dedicated to the care of this patient on the date of this encounter to include pre-visit review of records, face-to-face time with the patient discussing conditions above, post visit ordering of  testing, clinical documentation with the electronic health record, making appropriate referrals as documented, and communicating necessary information to the patient's healthcare team.  Bevelyn Ngo, NP 01/17/2024  11:01 AM    January 17, 2024   OL[facility] GILK[facility] [address] [place] [address]   Dear [name],  The following has been scheduled for you:   Procedure: Bronchoscopy                 Surgeon:  Dr. Delton Coombes   Procedure date:  01/23/24 at  9:15am Arrival Time:  6:45am Location: [facility] [address] [place], [place] [address]   Additional Information: Do not eat anything after midnight - ok to take medications on the day of procedure with only sips of water You will need someone to drive you home after this procedure & to be with you for the next 24 hours You may receive a call from the hospital a day or two prior to procedure to go over additional instructions & medications  NEXT VISIT:  01/30/2024 at 2:00pm with Kandice Robinsons, NP at    [facility]    [address] [place], [place]  [address]    [facility]    [address] [place], [place]  [address]

## 2024-01-17 NOTE — H&P (View-Only) (Signed)
 Trelegy  History of Present Illness [name] is a 63 y.o. female current every day smoker followed by Dr. Celine Mans for COPD, here 12/2023 for follow up of a pulmonary mass found incidentally on an ED Visit. She will be followed by Dr. Delton Coombes for her biopsy.  Pt. Has consented to use of [employer] soft wear to help capture verbal  the content of this OV.   01/17/2024 [name] is a 63 year old female with COPD who presents with an incidental  lung mass found on imaging. She is accompanied by her daughter.  A lung mass was discovered during an emergency room visit for a suspected COPD flare. Imaging, including a CT scan and chest x-ray, revealed a mass in the right upper lobe of the lung. A PET scan was ordered and done 01/12/2024, and  indicated hypermetabolism in the mass, suggesting active growth.  The PET scan confirmed the presence of a hypermetabolic mass in the right upper lobe, measuring three millimeters. Other small areas of nodularity in the right upper lobe appeared smaller and less extensive than they had on the earlier CT Chest, and are suspected to be infectious or inflammatory, but bear close observation moving forward. .  She is currently on one and a half liters of oxygen. Despite efforts to quit, she is continuing to smoke.  She is using  nicotine gum. She has a history of headaches, which she attributes to her COPD and neck issues, and frequently uses Goody powders for relief, which contain aspirin. She denies having sleep apnea, diabetes, or a latex allergy. She is not on blood thinners but has been advised to stop Goody powders before the biopsy due to their aspirin content.  We discussed the biopsy, its risks and benefits. Patient has agreed to move forward with the procedure. Her daughter who is with her today, in in agreement also.   Pt. States she cannot afford the Breo inhaler she was prescribed. We will provide her with financial assistance paperwork, and do a therapeutic  trial of Trelegy to see if this helps with her dyspnea. She she is in agreement with this plan.     Test Results: 01/12/2024 PET Scan IMPRESSION: Hypermetabolic mass seen corresponding to the lesion in the right upper lobe immediately adjacent to the right hilum consistent with a malignant mass measuring 3 cm.   No additional areas of abnormal radiotracer uptake at this time to suggest additional areas of disease.   Small areas of nodularity elsewhere the right upper lobe on the prior examination appears smaller and less extensive today. There is some stable nodular areas at the right lung base. Simple attention on follow-up.   Prominent right paratracheal node on the prior CT scan does not show any abnormal uptake.  12/24/2023 CTA Chest Mediastinum/Nodes: 1.3 cm right hilar lymph node (series 11, image 60). 0.9 cm right paratracheal node (series 11, image 37). No axillary or left hilar lymphadenopathy. Thyroid gland, trachea, and esophagus within normal limits.   Lungs/Pleura: Perihilar mass in the right upper lobe with irregular margins measures 3.8 x 2.4 x 2.6 cm (series 19, image 53). 7 mm right upper lobe nodule (series 19, image 28). 1.6 cm subpleural nodule the anterior aspect of the right upper lobe (series 19, image 72). Background of emphysema. No pleural effusion or pneumothorax.   Upper Abdomen: Cholelithiasis. Advanced abdominal aortic atherosclerosis. Multiple hepatic calcified granulomas. No acute abnormality.   Musculoskeletal: Mild superior endplate compression fracture of T6, favored chronic  although new since 2023. No suspicious bone lesion identified. No chest wall abnormality.   Review of the MIP images confirms the above findings.   IMPRESSION: 1. No evidence of pulmonary embolism. 2. Perihilar mass in the right upper lobe measuring up to 3.8 cm, highly suspicious for primary bronchogenic carcinoma. Recommend consultation with  pulmonology/multidisciplinary tumor board. 3. Additional bilateral pulmonary nodules measuring up to 1.6 cm in the right upper lobe. Metastatic disease or synchronous malignancies are both considerations. 4. Mildly enlarged right hilar lymph node, highly suspicious for metastatic disease. Mildly prominent right paratracheal lymph node, indeterminate. 5. Mild superior endplate compression fracture of T6, favored chronic although new since 2023. 6. Cholelithiasis. 7. Aortic and coronary artery atherosclerosis     Latest Ref Rng & Units 12/27/2023    3:05 AM 12/26/2023    2:49 AM 12/25/2023    2:42 AM  CBC  WBC 4.0 - 10.5 K/uL 17.2  21.6  10.7   Hemoglobin 12.0 - 15.0 g/dL 16.1  09.6  04.5   Hematocrit 36.0 - 46.0 % 37.8  35.4  40.1   Platelets 150 - 400 K/uL 295  285  274        Latest Ref Rng & Units 12/27/2023    3:05 AM 12/26/2023    2:49 AM 12/25/2023    2:42 AM  BMP  Glucose 70 - 99 mg/dL 409  811  914   BUN 8 - 23 mg/dL 25  31  14    Creatinine 0.44 - 1.00 mg/dL 7.82  9.56  2.13   Sodium 135 - 145 mmol/L 136  135  137   Potassium 3.5 - 5.1 mmol/L 4.7  4.6  4.3   Chloride 98 - 111 mmol/L 95  95  95   CO2 22 - 32 mmol/L 30  33  32   Calcium 8.9 - 10.3 mg/dL 8.9  8.9  9.3     BNP No results found for: "BNP"  ProBNP No results found for: "PROBNP"  PFT    Component Value Date/Time   FEV1PRE 1.28 06/07/2018 1610   FVCPRE 2.25 06/07/2018 1610   PREFEV1FVCRT 57 06/07/2018 1610    NM PET Image Initial (PI) Skull Base To Thigh Result Date: 01/16/2024 CLINICAL DATA:  Initial treatment strategy for lung mass. EXAM: NUCLEAR MEDICINE PET SKULL BASE TO THIGH TECHNIQUE: 7.05 mCi F-18 FDG was injected intravenously. Full-ring PET imaging was performed from the skull base to thigh after the radiotracer. CT data was obtained and used for attenuation correction and anatomic localization. Fasting blood glucose: 106 mg/dl COMPARISON:  CTA 08/65/7846. FINDINGS: Mediastinal blood pool  activity: SUV max 2.8 Liver activity: SUV max 3.0 NECK: There is uptake along both sides muscles of mastication, physiologic. No specific abnormal uptake seen in the neck along lymph node change including submandibular, posterior triangle internal jugular region. The visualized portions of the intra compartment has near symmetric uptake. Incidental CT findings: Visualized paranasal sinuses and mastoid air cells are clear. Significant streak artifact related to the spinal fixation hardware both anterior and posteriorly. Vascular calcifications are seen. Grossly the parotid glands, submandibular glands and thyroid gland is unremarkable. No specific abnormal lymph node enlargement identified in the visualized neck. CHEST: Hypermetabolic activity identified corresponding to the mass in the right lung with maximum SUV value of 18.7. This lesion is in the inferior central right upper lobe which is perihilar and again measures approximately 3.0 x 3.0 cm on axial CT image 70. Mild adjacent stranding. No additional areas  of abnormal radiotracer uptake in the thorax including elsewhere along the lung parenchyma. No areas of abnormal uptake above mediastinal blood pool in the axillary region, hilum or mediastinum. Incidental CT findings: Emphysematous lung changes identified. Previously there is some small nodules elsewhere including in the right upper lobe measuring 7 mm. This area appears smaller today. Nodular area anteromedial right upper lobe on the previous measures 16 mm is much smaller today with some minimal opacity on series 4, image 76. No specific abnormal uptake. Minimal radiotracer seen with maximum SUV value of 1.6. This may have been infectious or inflammatory or atelectatic. Persistent nodular areas at the right lung base with some thickening are stable but do not show any abnormal uptake. Coronary calcifications are seen. Heart nonenlarged. Vascular calcifications along the thoracic aorta. ABDOMEN/PELVIS:  There is physiologic distribution radiotracer along the parenchymal organs, bowel and renal collecting systems. Mild uptake along the right diaphragmatic crus is noted, nonspecific. Incidental CT findings: Gallstones. Diffuse vascular calcifications. No bowel obstruction. Scattered colonic stool. Grossly the liver, spleen, adrenal glands and pancreas are unremarkable on this noncontrast examination. Multiple Paddock granulomas are seen. No abnormal calcifications are seen within either kidney nor along the course of either ureter. SKELETON: No specific abnormal uptake identified in the skeleton. Degenerative changes seen along the spine. IMPRESSION: Hypermetabolic mass seen corresponding to the lesion in the right upper lobe immediately adjacent to the right hilum consistent with a malignant mass measuring 3 cm. No additional areas of abnormal radiotracer uptake at this time to suggest additional areas of disease. Small areas of nodularity elsewhere the right upper lobe on the prior examination appears smaller and less extensive today. There is some stable nodular areas at the right lung base. Simple attention on follow-up. Prominent right paratracheal node on the prior CT scan does not show any abnormal uptake. Electronically Signed   By: Karen Kays M.D.   On: 01/16/2024 13:36   CT Angio Chest PE W and/or Wo Contrast Result Date: 12/24/2023 CLINICAL DATA:  Pulmonary embolism (PE) suspected, high prob EXAM: CT ANGIOGRAPHY CHEST WITH CONTRAST TECHNIQUE: Multidetector CT imaging of the chest was performed using the standard protocol during bolus administration of intravenous contrast. Multiplanar CT image reconstructions and MIPs were obtained to evaluate the vascular anatomy. RADIATION DOSE REDUCTION: This exam was performed according to the departmental dose-optimization program which includes automated exposure control, adjustment of the mA and/or kV according to patient size and/or use of iterative  reconstruction technique. CONTRAST:  75mL OMNIPAQUE IOHEXOL 350 MG/ML SOLN COMPARISON:  X-ray 12/24/2023, CT 11/15/2021 FINDINGS: Cardiovascular: Satisfactory opacification of the pulmonary arteries to the segmental level. No evidence of pulmonary embolism. Thoracic aorta is nonaneurysmal. Scattered atherosclerotic vascular calcifications of the aorta and coronary arteries. Normal heart size. No pericardial effusion. Mediastinum/Nodes: 1.3 cm right hilar lymph node (series 11, image 60). 0.9 cm right paratracheal node (series 11, image 37). No axillary or left hilar lymphadenopathy. Thyroid gland, trachea, and esophagus within normal limits. Lungs/Pleura: Perihilar mass in the right upper lobe with irregular margins measures 3.8 x 2.4 x 2.6 cm (series 19, image 53). 7 mm right upper lobe nodule (series 19, image 28). 1.6 cm subpleural nodule the anterior aspect of the right upper lobe (series 19, image 72). Background of emphysema. No pleural effusion or pneumothorax. Upper Abdomen: Cholelithiasis. Advanced abdominal aortic atherosclerosis. Multiple hepatic calcified granulomas. No acute abnormality. Musculoskeletal: Mild superior endplate compression fracture of T6, favored chronic although new since 2023. No suspicious bone lesion identified. No  chest wall abnormality. Review of the MIP images confirms the above findings. IMPRESSION: 1. No evidence of pulmonary embolism. 2. Perihilar mass in the right upper lobe measuring up to 3.8 cm, highly suspicious for primary bronchogenic carcinoma. Recommend consultation with pulmonology/multidisciplinary tumor board. 3. Additional bilateral pulmonary nodules measuring up to 1.6 cm in the right upper lobe. Metastatic disease or synchronous malignancies are both considerations. 4. Mildly enlarged right hilar lymph node, highly suspicious for metastatic disease. Mildly prominent right paratracheal lymph node, indeterminate. 5. Mild superior endplate compression fracture of  T6, favored chronic although new since 2023. 6. Cholelithiasis. 7. Aortic and coronary artery atherosclerosis (ICD10-I70.0). Electronically Signed   By: Duanne Guess D.O.   On: 12/24/2023 14:40   CT Head Wo Contrast Result Date: 12/24/2023 CLINICAL DATA:  Headache. EXAM: CT HEAD WITHOUT CONTRAST TECHNIQUE: Contiguous axial images were obtained from the base of the skull through the vertex without intravenous contrast. RADIATION DOSE REDUCTION: This exam was performed according to the departmental dose-optimization program which includes automated exposure control, adjustment of the mA and/or kV according to patient size and/or use of iterative reconstruction technique. COMPARISON:  11/15/2021. FINDINGS: Brain: No evidence of acute infarction, hemorrhage, hydrocephalus, extra-axial collection or mass lesion/mass effect. Vascular: No hyperdense vessel or unexpected calcification. Skull: Normal. Negative for fracture or focal lesion. Sinuses/Orbits: Globes and orbits are unremarkable. Mucosal thickening lines the right maxillary sinus associated with dependent fluid. Mild right sphenoid sinus mucosal thickening. Remaining visualized sinuses are clear. Other: None. IMPRESSION: 1. No intracranial abnormalities. 2. Right maxillary and sphenoid sinus disease. Fluid in the right maxillary sinus can be seen with acute sinusitis in the appropriate clinical setting. Electronically Signed   By: Amie Portland M.D.   On: 12/24/2023 14:32   DG Chest Port 1 View Result Date: 12/24/2023 CLINICAL DATA:  Dyspnea.  Flu like symptoms. EXAM: PORTABLE CHEST 1 VIEW COMPARISON:  05/01/2023 FINDINGS: Heart size is normal. No pleural fluid or interstitial edema. No airspace consolidation. The lungs appear hyperinflated with coarsened interstitial markings of COPD/emphysema. New perihilar masslike opacity in the right upper lobe measures approximately 3.1 cm. Remote bilateral rib fractures noted. IMPRESSION: 1. New 3.1 cm perihilar  masslike opacity in the right upper lobe. Recommend further evaluation with contrast-enhanced chest CT. 2. COPD/emphysema. Electronically Signed   By: Signa Kell M.D.   On: 12/24/2023 13:06     Past medical hx Past Medical History:  Diagnosis Date   Arthritis    cervical spine   ASTHMA UNSPECIFIED WITH EXACERBATION 07/21/2010   CARPAL TUNNEL SYNDROME, BILATERAL 01/16/2008   COPD 05/08/2008   Dyspnea    intermittent. worsens with anxiety   GERD 04/18/2007   HELICOBACTER PYLORI GASTRITIS, HX OF 04/18/2007   PANIC DISORDER 04/18/2007   TOBACCO ABUSE 07/17/2009     Social History   Tobacco Use   Smoking status: Every Day    Current packs/day: 0.75    Average packs/day: 1.5 packs/day for 50.3 years (74.5 ttl pk-yrs)    Types: Cigarettes    Start date: 1975   Smokeless tobacco: Never   Tobacco comments:    Trying nicotine patches, made her nauseous at first, going to cut patches in half.    1 cigarette a day-01/17/2024  Vaping Use   Vaping status: Never Used  Substance Use Topics   Alcohol use: No    Comment: occasional   Drug use: No    [name] reports that she has been smoking cigarettes. She started smoking about 50 years  ago. She has a 74.5 pack-year smoking history. She has never used smokeless tobacco. She reports that she does not drink alcohol and does not use drugs.  Tobacco Cessation: Ready to quit: Not Answered Counseling given: Not Answered Tobacco comments: Trying nicotine patches, made her nauseous at first, going to cut patches in half. 1 cigarette a day-01/17/2024 Current every day smoker who has cut down significantly, but does still smoke 1-2 cigarettes a day.  Past surgical hx, Family hx, Social hx all reviewed.  Current Outpatient Medications on File Prior to Visit  Medication Sig   albuterol (VENTOLIN HFA) 108 (90 Base) MCG/ACT inhaler INHALE 2 PUFFS INTO THE LUNGS EVERY 4 HOURS AS NEEDED FOR WHEEZING OR SHORTNESS OF BREATH.   ALPRAZolam (XANAX)  1 MG tablet Take 1 mg by mouth 3 (three) times daily.   fluticasone furoate-vilanterol (BREO ELLIPTA) 200-25 MCG/ACT AEPB Inhale 1 puff into the lungs daily. (Patient not taking: Reported on 01/17/2024)   gabapentin (NEURONTIN) 100 MG capsule TAKE 1 CAPSULE (100 MG TOTAL) BY MOUTH THREE TIMES DAILY. (Patient taking differently: Take 100 mg by mouth 2 (two) times daily.)   ipratropium-albuterol (DUONEB) 0.5-2.5 (3) MG/3ML SOLN Take 3 mLs by nebulization every 6 (six) hours as needed.   nicotine (NICODERM CQ - DOSED IN MG/24 HOURS) 21 mg/24hr patch Place 1 patch (21 mg total) onto the skin daily.   oxyCODONE-acetaminophen (PERCOCET/ROXICET) 5-325 MG tablet Take 1 tablet by mouth every 6 (six) hours as needed for up to 5 days for severe pain (pain score 7-10).   predniSONE (DELTASONE) 10 MG tablet Take 4 tabs for 3 days, then 3 tabs for 3 days, then 2 tabs for 3 days, then 1 tab for 3 days, then 1/2 tab for 4 days.   No current facility-administered medications on file prior to visit.     Allergies  Allergen Reactions   Aspirin Other (See Comments)    REACTION: had h. pylori due to too many goody powders. told not to take any more asa    Review Of Systems:  Constitutional:   +  weight loss, night sweats,  Fevers, chills, + fatigue, or  lassitude.  HEENT:   + headaches,  No Difficulty swallowing,  Tooth/dental problems, or  Sore throat,                No sneezing, itching, ear ache, nasal congestion, post nasal drip,   CV:  No chest pain,  Orthopnea, PND, swelling in lower extremities, anasarca, dizziness, palpitations, syncope.   GI  No heartburn, indigestion, abdominal pain, nausea, vomiting, diarrhea, change in bowel habits, loss of appetite, bloody stools.   Resp: + baseline  shortness of breath with exertion less at rest.  + Baseline  excess mucus, + baseline productive cough,  No non-productive cough,  No coughing up of blood.  No change in color of mucus.  + wheezing.  No chest wall  deformity  Skin: no rash or lesions.  GU: no dysuria, change in color of urine, no urgency or frequency.  No flank pain, no hematuria   MS:  No joint pain or swelling.  + decreased range of motion.  + back pain.  Psych:  No change in mood or affect. + depression  and + anxiety.  No memory loss.   Vital Signs BP 122/76 (BP Location: Right Arm, Patient Position: Sitting, Cuff Size: Normal)   Pulse 99   Ht 5\' 6"  (1.676 m)   Wt 133 lb 6.4 oz (60.5  kg)   SpO2 96%   BMI 21.53 kg/m    Physical Exam:  General- No distress,  A&Ox3, pleasant frail female in wheelchair ENT: No sinus tenderness, TM clear, pale nasal mucosa, no oral exudate,no post nasal drip, no LAN Cardiac: S1, S2, regular rate and rhythm, no murmur Chest: + wheeze/ No rales/ dullness; no accessory muscle use, no nasal flaring, no sternal retractions Abd.: Soft Non-tender, ND, BS +Body mass index is 21.53 kg/m.  Ext: No clubbing cyanosis, edema, no obvious deformities Neuro: Physical deconditioning, MAE x 4, A&O x 3 Skin: No rashes, warm and dry, no obvious lesions  Psych: normal mood and behavior   Assessment/Plan Pulmonary nodule Mass in right upper lobe with hypermetabolism on PET scan concerning for malignancy.  Biopsy necessary for tissue diagnosis and treatment planning. - Order bronchoscopy with biopsy under general anesthesia. - Your PET scan did show that the lung mass we are following did show positive uptake on your PET scan. Next step is for biopsy for tissue diagnosis. I have placed an order for a bronchoscopy with biopsies.  We have discussed the procedure in detail.  We have reviewed the risks and benefits of the procedure. These include bleeding, infection, puncture of the lung, and adverse reaction to anesthesia. You have agreed to proceed with biopsy to evaluate the left upper lobe nodule. Your procedure will be done by Dr. Levy Pupa. You will receive a letter today with date time and  information pertaining to the procedure. You will need someone to drive you to the procedure, stay with you during the procedure, and stay with you after the procedure. You will also need someone to stay with you for 24 hours after anesthesia to ensure you have cleared and are doing well. You will follow-up with me 1 week after the procedure to review the results and to ensure you are doing well. Call if you need [place] prior to the procedure or if you have any questions at all. Please contact office for sooner follow up if symptoms do not improve or worsen or seek emergency care     - Ensure she refrains from taking aspirin-containing goody powders prior to procedure. - Schedule follow-up in one week to discuss biopsy results.  Chronic Obstructive Pulmonary Disease (COPD) COPD with recent flare-up. Smoking cessation critical to manage COPD and prevent further lung damage. - Encourage smoking cessation with resources such as hypnosis, acupuncture, and nicotine replacement therapy. - Provide contact for 1-800-QUIT-NOW for free nicotine patches, gum, and mints. - Therapeutic Trial of Trelegy - 1 puff once daily, rinse mouth after use. - We have provided paperwork for financial assistance.  Current Every Day smoker Working hard to quit Counseling done x 3 minutes today in the office. Plan Please work on quitting smoking completely. You can receive free nicotine replacement therapy (patches, gum, or mints) by calling 1-800-QUIT NOW. Please call so we can get you on the path to becoming a non-smoker. I know it is hard, but you can do this!  Hypnosis for smoking cessation  [employer] [phone]  Acupuncture for smoking cessation  [employer] [phone]    I spent 35 minutes dedicated to the care of this patient on the date of this encounter to include pre-visit review of records, face-to-face time with the patient discussing conditions above, post visit ordering of  testing, clinical documentation with the electronic health record, making appropriate referrals as documented, and communicating necessary information to the patient's healthcare team.  Bevelyn Ngo, NP 01/17/2024  11:01 AM    January 17, 2024   [name] [address] [place] [address]   Dear [name],  The following has been scheduled for you:   Procedure: Bronchoscopy                 Surgeon:  Dr. Delton Coombes   Procedure date:  01/23/24 at  9:15am Arrival Time:  6:45am Location: [facility] [address] [place], [place] [address]   Additional Information: Do not eat anything after midnight - ok to take medications on the day of procedure with only sips of water You will need someone to drive you home after this procedure & to be with you for the next 24 hours You may receive a call from the hospital a day or two prior to procedure to go over additional instructions & medications  NEXT VISIT:  01/30/2024 at 2:00pm with Kandice Robinsons, NP at    [facility]    [address] [place], [place]  [address]    [facility]    [address] [place], [place]  [address]

## 2024-01-17 NOTE — Telephone Encounter (Signed)
 Refill oxycodone once more.  This will be the final refill until she gets in to see chronic pain management-which I believe is set up for the 14th  Kristian Covey MD [facility] at [facility]

## 2024-01-18 ENCOUNTER — Other Ambulatory Visit: Payer: Self-pay | Admitting: Acute Care

## 2024-01-18 ENCOUNTER — Telehealth: Payer: Self-pay | Admitting: Acute Care

## 2024-01-18 DIAGNOSIS — R911 Solitary pulmonary nodule: Secondary | ICD-10-CM

## 2024-01-18 NOTE — Telephone Encounter (Signed)
 I have called the patient to let her know we will need to get a super D CT Chest before her bronchoscopy with biopsies. The scan we have did not work when run  through the computer . Patient is in agreement with getting the scan. She understands it must be done before Tuesday of next week. The order has been placed. Nothing further is needed.

## 2024-01-19 ENCOUNTER — Ambulatory Visit (HOSPITAL_BASED_OUTPATIENT_CLINIC_OR_DEPARTMENT_OTHER)
Admission: RE | Admit: 2024-01-19 | Discharge: 2024-01-19 | Disposition: A | Discharge status: Home / Self Care | Source: Ambulatory Visit | Attending: Acute Care | Admitting: Acute Care

## 2024-01-19 ENCOUNTER — Encounter (HOSPITAL_COMMUNITY): Payer: Self-pay | Admitting: Emergency Medicine

## 2024-01-19 ENCOUNTER — Other Ambulatory Visit: Payer: Self-pay

## 2024-01-19 DIAGNOSIS — R911 Solitary pulmonary nodule: Secondary | ICD-10-CM | POA: Insufficient documentation

## 2024-01-19 NOTE — Progress Notes (Signed)
 PCP - Dr Edwin Cap Cardiologist - none Pulmonology - Dr Durel Salts  Chest x-ray - 12/24/23 EKG - 12/24/23 Stress Test - 06/24/14 ECHO - 06/10/19 Cardiac Cath - n/a  ICD Pacemaker/Loop - n/a  Sleep Study -  n/a  Diabetes - n/a  Aspirin & Blood Thinner Instructions:  n/a  NPO   Anesthesia review: no  STOP now taking any Aspirin (unless otherwise instructed by your surgeon), Aleve, Naproxen, Ibuprofen, Motrin, Advil, Goody's, BC's, all herbal medications, fish oil, and all vitamins.   Coronavirus Screening Do you have any of the following symptoms:  Cough yes/no: No Fever (>100.5F)  yes/no: No Runny nose yes/no: No Sore throat yes/no: No Difficulty breathing/shortness of breath  yes/no: No  Have you traveled in the last 14 days and where? yes/no: No  Patient verbalized understanding of instructions that were given via phone.

## 2024-01-22 ENCOUNTER — Other Ambulatory Visit: Payer: Self-pay | Admitting: Family Medicine

## 2024-01-22 NOTE — Telephone Encounter (Signed)
 Copied from CRM [id number]. Topic: Clinical - Medication Refill >> Jan 22, 2024  9:45 AM Artemio Larry wrote: Most Recent Primary Care Visit:  Provider: [name]  Department: [facility]  Visit Type: HOSPITAL FOLLOW UP  Date: 01/01/2024  Medication: oxyCODONE   Has the patient contacted their pharmacy? Yes (Agent: If no, request that the patient contact the pharmacy for the refill. If patient does not wish to contact the pharmacy document the reason why and proceed with request.) (Agent: If yes, when and what did the pharmacy advise?)  Is this the correct pharmacy for this prescription? Yes If no, delete pharmacy and type the correct one.  This is the patient's preferred pharmacy:  [id number] - [place], [place] - [address] [place] [address] Phone: [phone] Fax: [phone]  [facility] [address] [place] [address] Phone: [phone] Fax: [phone]   Has the prescription been filled recently? Yes  Is the patient out of the medication? No  Has the patient been seen for an appointment in the last year OR does the patient have an upcoming appointment? No  Can we respond through MyChart? Yes  Agent: Please be advised that Rx refills may take up to 3 business days. We ask that you follow-up with your pharmacy.

## 2024-01-23 ENCOUNTER — Ambulatory Visit (HOSPITAL_COMMUNITY)
Admission: RE | Admit: 2024-01-23 | Discharge: 2024-01-23 | Disposition: A | Discharge status: Home / Self Care | Attending: Emergency Medicine | Admitting: Emergency Medicine

## 2024-01-23 ENCOUNTER — Encounter (HOSPITAL_COMMUNITY): Payer: Self-pay | Admitting: Emergency Medicine

## 2024-01-23 ENCOUNTER — Ambulatory Visit (HOSPITAL_COMMUNITY)

## 2024-01-23 ENCOUNTER — Ambulatory Visit (HOSPITAL_BASED_OUTPATIENT_CLINIC_OR_DEPARTMENT_OTHER)

## 2024-01-23 ENCOUNTER — Encounter (HOSPITAL_COMMUNITY)
Admission: RE | Disposition: A | Discharge status: Home / Self Care | Payer: Self-pay | Source: Home / Self Care | Attending: Emergency Medicine

## 2024-01-23 DIAGNOSIS — F418 Other specified anxiety disorders: Secondary | ICD-10-CM | POA: Insufficient documentation

## 2024-01-23 DIAGNOSIS — Z87891 Personal history of nicotine dependence: Secondary | ICD-10-CM | POA: Diagnosis not present

## 2024-01-23 DIAGNOSIS — I700 Atherosclerosis of aorta: Secondary | ICD-10-CM | POA: Diagnosis not present

## 2024-01-23 DIAGNOSIS — J449 Chronic obstructive pulmonary disease, unspecified: Secondary | ICD-10-CM | POA: Insufficient documentation

## 2024-01-23 DIAGNOSIS — M1990 Unspecified osteoarthritis, unspecified site: Secondary | ICD-10-CM | POA: Insufficient documentation

## 2024-01-23 DIAGNOSIS — C3411 Malignant neoplasm of upper lobe, right bronchus or lung: Secondary | ICD-10-CM

## 2024-01-23 DIAGNOSIS — J439 Emphysema, unspecified: Secondary | ICD-10-CM | POA: Insufficient documentation

## 2024-01-23 DIAGNOSIS — F17210 Nicotine dependence, cigarettes, uncomplicated: Secondary | ICD-10-CM | POA: Insufficient documentation

## 2024-01-23 DIAGNOSIS — R918 Other nonspecific abnormal finding of lung field: Secondary | ICD-10-CM | POA: Diagnosis not present

## 2024-01-23 DIAGNOSIS — R911 Solitary pulmonary nodule: Secondary | ICD-10-CM | POA: Diagnosis present

## 2024-01-23 HISTORY — DX: Pneumonia, unspecified organism: J18.9

## 2024-01-23 HISTORY — PX: BRONCHIAL BIOPSY: SHX5109

## 2024-01-23 HISTORY — PX: BRONCHIAL NEEDLE ASPIRATION BIOPSY: SHX5106

## 2024-01-23 SURGERY — BRONCHOSCOPY, WITH BIOPSY USING ELECTROMAGNETIC NAVIGATION
Anesthesia: General

## 2024-01-23 MED ORDER — GABAPENTIN 100 MG PO CAPS
100.0000 mg | ORAL_CAPSULE | Freq: Two times a day (BID) | ORAL | Status: DC
Start: 2024-01-23 — End: 2024-02-09

## 2024-01-23 MED ORDER — DEXAMETHASONE SODIUM PHOSPHATE 10 MG/ML IJ SOLN
INTRAMUSCULAR | Status: DC | PRN
  Administered 2024-01-23: 10 mg via INTRAVENOUS

## 2024-01-23 MED ORDER — PROPOFOL 10 MG/ML IV BOLUS
INTRAVENOUS | Status: DC | PRN
  Administered 2024-01-23: 100 mg via INTRAVENOUS

## 2024-01-23 MED ORDER — OXYCODONE HCL 5 MG/5ML PO SOLN: 5.0000 mg | Freq: Once | ORAL | Status: DC | PRN

## 2024-01-23 MED ORDER — PHENYLEPHRINE 80 MCG/ML (10ML) SYRINGE FOR IV PUSH (FOR BLOOD PRESSURE SUPPORT)
PREFILLED_SYRINGE | INTRAVENOUS | Status: DC | PRN
  Administered 2024-01-23: 160 ug via INTRAVENOUS
  Administered 2024-01-23: 80 ug via INTRAVENOUS

## 2024-01-23 MED ORDER — IPRATROPIUM-ALBUTEROL 0.5-2.5 (3) MG/3ML IN SOLN
3.0000 mL | Freq: Once | RESPIRATORY_TRACT | Status: AC
  Administered 2024-01-23: 3 mL via RESPIRATORY_TRACT

## 2024-01-23 MED ORDER — PHENYLEPHRINE HCL-NACL 20-0.9 MG/250ML-% IV SOLN
INTRAVENOUS | Status: DC | PRN
  Administered 2024-01-23: 40 ug/min via INTRAVENOUS

## 2024-01-23 MED ORDER — AMISULPRIDE (ANTIEMETIC) 5 MG/2ML IV SOLN: 10.0000 mg | Freq: Once | INTRAVENOUS | Status: DC | PRN

## 2024-01-23 MED ORDER — CHLORHEXIDINE GLUCONATE 0.12 % MT SOLN
15.0000 mL | OROMUCOSAL | Status: AC
  Administered 2024-01-23: 15 mL via OROMUCOSAL
  Filled 2024-01-23: qty 15

## 2024-01-23 MED ORDER — ROCURONIUM BROMIDE 10 MG/ML (PF) SYRINGE
PREFILLED_SYRINGE | INTRAVENOUS | Status: DC | PRN
  Administered 2024-01-23: 40 mg via INTRAVENOUS

## 2024-01-23 MED ORDER — LACTATED RINGERS IV SOLN: INTRAVENOUS | Status: DC

## 2024-01-23 MED ORDER — SUGAMMADEX SODIUM 200 MG/2ML IV SOLN
INTRAVENOUS | Status: DC | PRN
  Administered 2024-01-23: 200 mg via INTRAVENOUS

## 2024-01-23 MED ORDER — OXYCODONE HCL 5 MG PO TABS: 5.0000 mg | ORAL_TABLET | Freq: Once | ORAL | Status: DC | PRN

## 2024-01-23 MED ORDER — ONDANSETRON HCL 4 MG/2ML IJ SOLN
INTRAMUSCULAR | Status: DC | PRN
  Administered 2024-01-23: 4 mg via INTRAVENOUS

## 2024-01-23 MED ORDER — IPRATROPIUM-ALBUTEROL 0.5-2.5 (3) MG/3ML IN SOLN
RESPIRATORY_TRACT | Status: AC
Start: 2024-01-23 — End: 2024-01-23
  Filled 2024-01-23: qty 3

## 2024-01-23 MED ORDER — PROPOFOL 500 MG/50ML IV EMUL
INTRAVENOUS | Status: DC | PRN
  Administered 2024-01-23: 150 ug/kg/min via INTRAVENOUS

## 2024-01-23 MED ORDER — ALBUTEROL SULFATE HFA 108 (90 BASE) MCG/ACT IN AERS
INHALATION_SPRAY | RESPIRATORY_TRACT | Status: DC | PRN
Start: 2024-01-23 — End: 2024-01-23
  Administered 2024-01-23: 2 via RESPIRATORY_TRACT

## 2024-01-23 MED ORDER — FENTANYL CITRATE (PF) 100 MCG/2ML IJ SOLN: 25.0000 ug | INTRAMUSCULAR | Status: DC | PRN

## 2024-01-23 MED ORDER — LIDOCAINE 2% (20 MG/ML) 5 ML SYRINGE
INTRAMUSCULAR | Status: DC | PRN
  Administered 2024-01-23: 60 mg via INTRAVENOUS

## 2024-01-23 NOTE — Transfer of Care (Signed)
 Immediate Anesthesia Transfer of Care Note  Patient: [name]  Procedure(s) Performed: BRONCHOSCOPY, WITH BIOPSY USING ELECTROMAGNETIC NAVIGATION BRONCHOSCOPY, WITH NEEDLE ASPIRATION BIOPSY BRONCHOSCOPY, WITH BIOPSY  Patient Location: PACU  Anesthesia Type:General  Level of Consciousness: awake, alert , and oriented  Airway & Oxygen Therapy: Patient connected to face mask oxygen  Post-op Assessment: Report given to RN and Post -op Vital signs reviewed and stable  Post vital signs: Reviewed and stable  Last Vitals:  Vitals Value Taken Time  BP 122/63 01/23/24 0930  Temp    Pulse 107 01/23/24 0931  Resp 13 01/23/24 0931  SpO2 97 % 01/23/24 0931  Vitals shown include unfiled device data.  Last Pain:  Vitals:   01/23/24 0716  TempSrc: Oral         Complications: No notable events documented.

## 2024-01-23 NOTE — Op Note (Signed)
 Video Bronchoscopy with Robotic Assisted Bronchoscopic Navigation   Date of Operation: 01/23/2024   Pre-op Diagnosis: Right upper lobe mass  Post-op Diagnosis: Right upper lobe mass  Surgeon: Racheal Buddle  Assistants: None  Anesthesia: General endotracheal anesthesia  Operation: Flexible video fiberoptic bronchoscopy with robotic assistance and biopsies.  Estimated Blood Loss: Minimal  Complications: None  Indications and History: [name] is a 63 y.o. female with history of tobacco use and COPD.  Found to have right upper lobe mass on chest imaging when she was being evaluated for exacerbation of COPD.  This was hypermetabolic on PET scan.  Recommendation made to achieve a tissue diagnosis via robotic assisted navigational bronchoscopy with biopsies. The risks, benefits, complications, treatment options and expected outcomes were discussed with the patient.  The possibilities of pneumothorax, pneumonia, reaction to medication, pulmonary aspiration, perforation of a viscus, bleeding, failure to diagnose a condition and creating a complication requiring transfusion or operation were discussed with the patient who freely signed the consent.    Description of Procedure: The patient was seen in the Preoperative Area, was examined and was deemed appropriate to proceed.  The patient was taken to [facility] endoscopy room 3, identified as [name] and the procedure verified as Flexible Video Fiberoptic Bronchoscopy.  A Time Out was held and the above information confirmed.   Prior to the date of the procedure a high-resolution CT scan of the chest was performed. Utilizing ION software program a virtual tracheobronchial tree was generated to allow the creation of distinct navigation pathways to the patient's parenchymal abnormalities. After being taken to the operating room general anesthesia was initiated and the patient  was orally intubated. The video fiberoptic bronchoscope was introduced  via the endotracheal tube and a general inspection was performed which showed normal left lung anatomy.  A pale vascular endobronchial lesion could be seen barely emanating from the orifice of subsegmental airway of the right upper lobe posterior segment.  Standard bronchoscope was too large to be directed at the lesion.  Aspiration of the bilateral mainstems was completed to remove any remaining secretions. Robotic catheter inserted into patient's endotracheal tube.   Target #1 right upper lobe mass: The distinct navigation pathways prepared prior to this procedure were then utilized to navigate to patient's lesion identified on CT scan. The robotic catheter was secured into place and the vision probe was withdrawn.  Lesion location was approximated using fluoroscopy. Under fluoroscopic guidance transbronchial needle brushings, transbronchial needle biopsies, and transbronchial forceps biopsies were performed to be sent for cytology and pathology.  Needle in lesion was confirmed using Cios three-dimensional imaging.   At the end of the procedure a general airway inspection was performed and there was no evidence of active bleeding. The bronchoscope was removed.  The patient tolerated the procedure well. There was no significant blood loss and there were no obvious complications. A post-procedural chest x-ray is pending.  Samples Target #1: 1. Transbronchial needle brushings from right upper lobe mass 2. Transbronchial Wang needle biopsies from right upper lobe mass 3. Transbronchial forceps biopsies from right upper lobe mass    Plans:  The patient will be discharged from the PACU to home when recovered from anesthesia and after chest x-ray is reviewed. We will review the cytology, pathology and microbiology results with the patient when they become available. Outpatient followup will be with Braden Caddy, NP and Dr. Baldwin Levee.    Racheal Buddle, MD, PhD 01/23/2024, 9:24 AM [facility]  [phone] or if no answer before 7:00PM call [phone] For any issues after 7:00PM please call eLink [phone]

## 2024-01-23 NOTE — Anesthesia Postprocedure Evaluation (Signed)
 Anesthesia Post Note  Patient: [name]  Procedure(s) Performed: BRONCHOSCOPY, WITH BIOPSY USING ELECTROMAGNETIC NAVIGATION BRONCHOSCOPY, WITH NEEDLE ASPIRATION BIOPSY BRONCHOSCOPY, WITH BIOPSY     Patient location during evaluation: PACU Anesthesia Type: General Level of consciousness: awake and alert Pain management: pain level controlled Vital Signs Assessment: post-procedure vital signs reviewed and stable Respiratory status: spontaneous breathing, nonlabored ventilation, respiratory function stable and patient connected to nasal cannula oxygen Cardiovascular status: blood pressure returned to baseline and stable Postop Assessment: no apparent nausea or vomiting Anesthetic complications: no  No notable events documented.  Last Vitals:  Vitals:   01/23/24 1000 01/23/24 1015  BP: (!) 99/52 (!) 101/53  Pulse: (!) 109 (!) 102  Resp: 15 18  Temp:  37.2 C  SpO2: 100% 92%    Last Pain:  Vitals:   01/23/24 1015  TempSrc:   PainSc: 0-No pain                 Reyaan Thoma L Neya Creegan

## 2024-01-23 NOTE — Anesthesia Procedure Notes (Signed)
 Procedure Name: Intubation Date/Time: 01/23/2024 8:46 AM  Performed by: Hershall Lory, CRNAPre-anesthesia Checklist: Patient identified, Emergency Drugs available, Suction available and Patient being monitored Patient Re-evaluated:Patient Re-evaluated prior to induction Oxygen Delivery Method: Circle system utilized Preoxygenation: Pre-oxygenation with 100% oxygen Induction Type: IV induction Ventilation: Mask ventilation without difficulty Laryngoscope Size: Glidescope Tube type: Oral Tube size: 8.5 mm Number of attempts: 1 Airway Equipment and Method: Stylet and Oral airway Placement Confirmation: ETT inserted through vocal cords under direct vision, positive ETCO2 and breath sounds checked- equal and bilateral Tube secured with: Tape Dental Injury: Teeth and Oropharynx as per pre-operative assessment

## 2024-01-23 NOTE — Discharge Instructions (Addendum)
 Flexible Bronchoscopy, Care After This sheet gives you information about how to care for yourself after your test. Your doctor may also give you more specific instructions. If you have problems or questions, contact your doctor. Follow these instructions at home: Eating and drinking When your numbness is gone and your cough and gag reflexes have come back, you may: Eat only soft foods. Slowly drink liquids. When you get home after the test, go back to your normal diet. Driving Do not drive for 24 hours if you were given a medicine to help you relax (sedative). Do not drive or use heavy machinery while taking prescription pain medicine. General instructions  Take over-the-counter and prescription medicines only as told by your doctor. Return to your normal activities as told. Ask what activities are safe for you. Do not use any products that have nicotine or tobacco in them. This includes cigarettes and e-cigarettes. If you need help quitting, ask your doctor. Keep all follow-up visits as told by your doctor. This is important. It is very important if you had a tissue sample (biopsy) taken. Get help right away if: You have shortness of breath that gets worse. You get light-headed. You feel like you are going to pass out (faint). You have chest pain. You cough up: More than a little blood. More blood than before. Summary Do not eat or drink anything (not even water) for 2 hours after your test, or until your numbing medicine wears off. Do not use cigarettes. Do not use e-cigarettes. Get help right away if you have chest pain.  Please call our office for any questions or concerns.  [phone].  This information is not intended to replace advice given to you by your health care provider. Make sure you discuss any questions you have with your health care provider. Document Released: 07/24/2009 Document Revised: 09/08/2017 Document Reviewed: 10/14/2016 Elsevier Patient Education  2020  [employer].

## 2024-01-23 NOTE — Anesthesia Preprocedure Evaluation (Addendum)
 Anesthesia Evaluation  Patient identified by MRN, date of birth, ID band Patient awake    Reviewed: Allergy & Precautions, NPO status , Patient's Chart, lab work & pertinent test results  Airway Mallampati: II  TM Distance: >3 FB Neck ROM: Full    Dental  (+) Edentulous Upper, Edentulous Lower, Dental Advisory Given   Pulmonary asthma , COPD (O2 prn),  COPD inhaler and oxygen dependent, Current Smoker and Patient abstained from smoking.   Pulmonary exam normal breath sounds clear to auscultation       Cardiovascular negative cardio ROS Normal cardiovascular exam Rhythm:Regular Rate:Normal  TTE 2020 1. The left ventricle has normal systolic function, with an ejection  fraction of 55-60%. The cavity size was normal. Left ventricular diastolic  Doppler parameters are consistent with impaired relaxation.   2. The right ventricle has normal systolic function. The cavity was  normal. There is no increase in right ventricular wall thickness.   3. Mild thickening of the aortic valve. Mild calcification of the aortic  valve. Aortic valve regurgitation was not assessed by color flow Doppler.   4. The aorta is normal unless otherwise noted.     Neuro/Psych  Headaches PSYCHIATRIC DISORDERS Anxiety Depression       GI/Hepatic Neg liver ROS,GERD  ,,  Endo/Other  negative endocrine ROS    Renal/GU negative Renal ROS  negative genitourinary   Musculoskeletal  (+) Arthritis ,    Abdominal   Peds  Hematology negative hematology ROS (+)   Anesthesia Other Findings   Reproductive/Obstetrics                             Anesthesia Physical Anesthesia Plan  ASA: 3  Anesthesia Plan: General   Post-op Pain Management:    Induction: Intravenous  PONV Risk Score and Plan: 2 and Dexamethasone, Ondansetron and TIVA  Airway Management Planned: Oral ETT  Additional Equipment:   Intra-op Plan:    Post-operative Plan: Extubation in OR  Informed Consent: I have reviewed the patients History and Physical, chart, labs and discussed the procedure including the risks, benefits and alternatives for the proposed anesthesia with the patient or authorized representative who has indicated his/her understanding and acceptance.     Dental advisory given  Plan Discussed with: CRNA  Anesthesia Plan Comments:        Anesthesia Quick Evaluation

## 2024-01-23 NOTE — Interval H&P Note (Signed)
 History and Physical Interval Note:  01/23/2024 7:36 AM  [name]  has presented today for surgery, with the diagnosis of LUNG NODULE.  The various methods of treatment have been discussed with the patient and family. After consideration of risks, benefits and other options for treatment, the patient has consented to  Procedure(s): BRONCHOSCOPY, WITH BIOPSY USING ELECTROMAGNETIC NAVIGATION (N/A) as a surgical intervention.  The patient's history has been reviewed, patient examined, no change in status, stable for surgery.  I have reviewed the patient's chart and labs.  Questions were answered to the patient's satisfaction.     Denson Flake

## 2024-01-24 ENCOUNTER — Telehealth: Payer: Self-pay | Admitting: Family Medicine

## 2024-01-24 ENCOUNTER — Encounter (HOSPITAL_COMMUNITY): Payer: Self-pay | Admitting: Emergency Medicine

## 2024-01-24 LAB — CYTOLOGY - NON PAP

## 2024-01-24 NOTE — Telephone Encounter (Signed)
 Copied from CRM [id number]. Topic: Clinical - Medication Refill >> Jan 22, 2024  9:45 AM Artemio Larry wrote: Most Recent Primary Care Visit:  Provider: [name]  Department: [facility]  Visit Type: HOSPITAL FOLLOW UP  Date: 01/01/2024  Medication: oxyCODONE   Has the patient contacted their pharmacy? Yes (Agent: If no, request that the patient contact the pharmacy for the refill. If patient does not wish to contact the pharmacy document the reason why and proceed with request.) (Agent: If yes, when and what did the pharmacy advise?)  Is this the correct pharmacy for this prescription? Yes If no, delete pharmacy and type the correct one.  This is the patient's preferred pharmacy:  [id number] [place], [place] - [address] [place] [address] Phone: [phone] Fax: [phone]  [facility] [address] [place] [address] Phone: [phone] Fax: [phone]   Has the prescription been filled recently? Yes  Is the patient out of the medication? No  Has the patient been seen for an appointment in the last year OR does the patient have an upcoming appointment? No  Can we respond through MyChart? Yes  Agent: Please be advised that Rx refills may take up to 3 business days. We ask that you follow-up with your pharmacy. >> Jan 24, 2024  1:41 PM Alysia Jumbo S wrote: Patient calling to check the status of med refill. Advised patient that med refills can take up to 3 business days.

## 2024-01-29 NOTE — Telephone Encounter (Signed)
 So I was out of office  for 4 days so unfortunately I did not see this message to refill  CS medicine for Dr Darren Em.  I will forward to PCP  now .

## 2024-01-30 ENCOUNTER — Encounter: Payer: Self-pay | Admitting: Acute Care

## 2024-01-30 ENCOUNTER — Ambulatory Visit: Admitting: Acute Care

## 2024-01-30 VITALS — BP 113/70 | HR 95 | Ht 66.0 in | Wt 133.2 lb

## 2024-01-30 DIAGNOSIS — F17210 Nicotine dependence, cigarettes, uncomplicated: Secondary | ICD-10-CM | POA: Diagnosis not present

## 2024-01-30 DIAGNOSIS — F17200 Nicotine dependence, unspecified, uncomplicated: Secondary | ICD-10-CM

## 2024-01-30 DIAGNOSIS — C3411 Malignant neoplasm of upper lobe, right bronchus or lung: Secondary | ICD-10-CM

## 2024-01-30 DIAGNOSIS — J449 Chronic obstructive pulmonary disease, unspecified: Secondary | ICD-10-CM | POA: Diagnosis not present

## 2024-01-30 DIAGNOSIS — Z9889 Other specified postprocedural states: Secondary | ICD-10-CM | POA: Diagnosis not present

## 2024-01-30 NOTE — Patient Instructions (Signed)
 It is good to see you today. 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  I will refer you to radiation oncology for treatment. You will get a call to get this scheduled.  I do not believe with your current lung function you are a candidate for surgery. Please let me know if you change your mind and  would like to repeat the pulmonary function tests, and be referred to surgery. Please work on quitting smoking. You can receive free nicotine  replacement therapy ( patches, gum or mints) by calling 1-800-QUIT NOW. Please call so we can get you on the path to becoming  a non-smoker. I know it is hard, but you can do this!  Other options for assistance in smoking cessation ( As covered by your insurance benefits)  Hypnosis for smoking cessation  [employer] [phone]  Acupuncture for smoking cessation  [facility] [phone]     Call if you need us  for anything. The cancer center will take great care of you. Please contact office for sooner follow up if symptoms do not improve or worsen or seek emergency care

## 2024-01-30 NOTE — Progress Notes (Signed)
 History of Present Illness [name] is a 63 y.o. female current every day smoker followed by Dr. Dione Franks for COPD, here 12/2023 for follow up of a pulmonary mass found incidentally on an ED Visit. She will be followed by Dr. Baldwin Levee for her biopsy.   Synopsis A lung mass was discovered during an emergency room visit for a suspected COPD flare. Imaging, including a CT scan and chest x-ray, revealed a mass in the right upper lobe of the lung. A PET scan was ordered and done 01/12/2024, and  indicated hypermetabolism in the mass, suggesting active growth.  The PET scan confirmed the presence of a hypermetabolic mass in the right upper lobe, measuring three millimeters. Other small areas of nodularity in the right upper lobe appeared smaller and less extensive than they had on the earlier CT Chest, and are suspected to be infectious or inflammatory, but bear close observation moving forward. .   She is currently on one and a half liters of oxygen. Despite efforts to quit, she is continuing to smoke.  She is using  nicotine  gum. She has a history of headaches, which she attributes to her COPD and neck issues, and frequently uses Goody powders for relief, which contain aspirin. She denies having sleep apnea, diabetes, or a latex allergy. She is not on blood thinners but has been advised to stop Goody powders before the biopsy due to their aspirin content.  Pt. Underwent bronchoscopy with biopsies on 01/23/2024. She is here to review results and ensure she has done well since the procedure.   01/30/2024 Pt. Presents for follow up after bronchoscopy with biopsies.  She states she has done well since the procedure. No bleeding, fever, discolored secretions, worsening dyspnea, or adverse reaction to anesthesia.  We have reviewed the results of the biopsy. It was positive for squamous cell lung cancer. Her previous PFT's done in 2019 which showed severe obstructive disease. We discussed options for treatment  for early stage lung cancer are surgical intervention in the setting of adequate pulmonary function test or radiation treatment through radiation oncology.  I am doubtful based on her PFTs of 6 years ago that she will be a candidate for surgery as she has continued to smoke.  I will refer to radiation oncology for evaluation.  Patient is agreement with this plan. I have offered pulmonary function tests in the event she wants to be referred to thoracic surgery.  At this time she is not interested in having pulmonary function tests to be evaluated for surgery.  She is here with her sister who is a great support person for her.  They have lost several family members to lung cancer over the last several years.    Test Results: Cytology 01/30/2024 A. LUNG, RUL, FINE NEEDLE ASPRIATION AND BIOPSIES:  - Squamous cell carcinoma, see comment   PET scan 01/12/2024 Hypermetabolic mass seen corresponding to the lesion in the right upper lobe immediately adjacent to the right hilum consistent with a malignant mass measuring 3 cm.   No additional areas of abnormal radiotracer uptake at this time to suggest additional areas of disease.   Small areas of nodularity elsewhere the right upper lobe on the prior examination appears smaller and less extensive today. There is some stable nodular areas at the right lung base. Simple attention on follow-up.   Prominent right paratracheal node on the prior CT scan does not show any abnormal uptake.  12/24/2023 CT head No evidence of acute infarction, hemorrhage,  hydrocephalus, extra-axial collection or mass lesion/mass effect. No intracranial abnormalities. Right maxillary and sphenoid sinus disease. Fluid in the right maxillary sinus can be seen with acute sinusitis in the appropriate clinical setting.     Latest Ref Rng & Units 12/27/2023    3:05 AM 12/26/2023    2:49 AM 12/25/2023    2:42 AM  CBC  WBC 4.0 - 10.5 K/uL 17.2  21.6  10.7   Hemoglobin 12.0 - 15.0  g/dL 11.9  14.7  82.9   Hematocrit 36.0 - 46.0 % 37.8  35.4  40.1   Platelets 150 - 400 K/uL 295  285  274        Latest Ref Rng & Units 12/27/2023    3:05 AM 12/26/2023    2:49 AM 12/25/2023    2:42 AM  BMP  Glucose 70 - 99 mg/dL 562  130  865   BUN 8 - 23 mg/dL 25  31  14    Creatinine 0.44 - 1.00 mg/dL 7.84  6.96  2.95   Sodium 135 - 145 mmol/L 136  135  137   Potassium 3.5 - 5.1 mmol/L 4.7  4.6  4.3   Chloride 98 - 111 mmol/L 95  95  95   CO2 22 - 32 mmol/L 30  33  32   Calcium  8.9 - 10.3 mg/dL 8.9  8.9  9.3     BNP No results found for: "BNP"  ProBNP No results found for: "PROBNP"  PFT    Component Value Date/Time   FEV1PRE 1.28 06/07/2018 1610   FVCPRE 2.25 06/07/2018 1610   PREFEV1FVCRT 57 06/07/2018 1610    CT Super D Chest Wo Contrast Result Date: 01/23/2024 CLINICAL DATA:  Provided history "". History from EMR right upper lobe mass, status post bronchoscopy and tissue sampling." * Tracking Code: BO * EXAM: CT CHEST WITHOUT CONTRAST TECHNIQUE: Multidetector CT imaging of the chest was performed using thin slice collimation for electromagnetic bronchoscopy planning purposes, without intravenous contrast. RADIATION DOSE REDUCTION: This exam was performed according to the departmental dose-optimization program which includes automated exposure control, adjustment of the mA and/or kV according to patient size and/or use of iterative reconstruction technique. COMPARISON:  PET-CT scan from 01/12/2024. FINDINGS: Cardiovascular: Normal cardiac size. No pericardial effusion. No aortic aneurysm. There are coronary artery calcifications, in keeping with coronary artery disease. There are also mild peripheral atherosclerotic vascular calcifications of thoracic aorta and its major branches. Mediastinum/Nodes: Visualized thyroid gland appears grossly unremarkable. No solid / cystic mediastinal masses. The esophagus is nondistended precluding optimal assessment. There are few mildly  prominent mediastinal lymph nodes, which do not meet the size criteria for lymphadenopathy and appear grossly similar to the prior study. No axillary lymphadenopathy by size criteria. Evaluation of bilateral hila is limited due to lack on intravenous contrast: however, no large hilar lymphadenopathy identified. Lungs/Pleura: The central tracheo-bronchial tree is patent. There are mild to moderate centrilobular emphysematous changes, with upper lobe predominance. Redemonstration of patient's known mass in the right lung upper lobe, posterior segment measuring 3.1 x 3.5 cm, slightly increased since the prior study when it measured up to 2.8 x 3.3 cm, when measured in similar fashion. There is new minimal opacity distally, likely due to post obstructive changes. Redemonstration of small grouping of tree-in-bud configuration nodules in the right lung lower lobe, which is unchanged and may represent sequela of prior infection or inflammation. No new mass or consolidation. No pleural effusion or pneumothorax. No suspicious lung nodules. Upper Abdomen:  Scattered calcified granulomas noted in the liver. Remaining visualized upper abdominal viscera within normal limits. Musculoskeletal: The visualized soft tissues of the chest wall are grossly unremarkable. No suspicious osseous lesions. There are mild multilevel degenerative changes in the visualized spine. Redemonstration of mild compression deformities of T4, T6 and T8 vertebrae, similar to the prior study from 12/24/2023. No significant retropulsion or spinal canal compromise. C4-C7 ACDF noted. IMPRESSION: 1. There is redemonstration of patient's known right lung upper lobe mass, which is slightly increased in size since the prior study. No new lung mass or consolidation. No new lymphadenopathy. 2. Multiple other nonacute observations, as described above. Aortic Atherosclerosis (ICD10-I70.0) and Emphysema (ICD10-J43.9). Electronically Signed   By: Beula Brunswick M.D.    On: 01/23/2024 10:36   DG Chest Port 1 View Result Date: 01/23/2024 CLINICAL DATA:  [id number] S/P bronchoscopy with biopsy [id number] EXAM: PORTABLE CHEST 1 VIEW COMPARISON:  12/24/2023. FINDINGS: Redemonstration of a well-circumscribed 3.1 x 3.6 cm mass overlying the right mid lung zone, which appears enlarged in the interim. Bilateral lung fields are otherwise clear. Bilateral costophrenic angles are clear. No pneumothorax. Normal cardio-mediastinal silhouette. No acute osseous abnormalities. Redemonstration of multiple old left upper posteromedial rib fractures. Lower cervical spinal fixation hardware noted. The soft tissues are within normal limits. IMPRESSION: Redemonstration of a well-circumscribed 3.1 x 3.6 cm mass overlying the right mid lung zone, which appears enlarged in the interim. No pneumothorax. Electronically Signed   By: Beula Brunswick M.D.   On: 01/23/2024 10:25   DG C-ARM BRONCHOSCOPY Result Date: 01/23/2024 C-ARM BRONCHOSCOPY: Fluoroscopy was utilized by the requesting physician.  No radiographic interpretation.   NM PET Image Initial (PI) Skull Base To Thigh Result Date: 01/16/2024 CLINICAL DATA:  Initial treatment strategy for lung mass. EXAM: NUCLEAR MEDICINE PET SKULL BASE TO THIGH TECHNIQUE: 7.05 mCi F-18 FDG was injected intravenously. Full-ring PET imaging was performed from the skull base to thigh after the radiotracer. CT data was obtained and used for attenuation correction and anatomic localization. Fasting blood glucose: 106 mg/dl COMPARISON:  CTA 78/29/5621. FINDINGS: Mediastinal blood pool activity: SUV max 2.8 Liver activity: SUV max 3.0 NECK: There is uptake along both sides muscles of mastication, physiologic. No specific abnormal uptake seen in the neck along lymph node change including submandibular, posterior triangle internal jugular region. The visualized portions of the intra compartment has near symmetric uptake. Incidental CT findings: Visualized paranasal sinuses and  mastoid air cells are clear. Significant streak artifact related to the spinal fixation hardware both anterior and posteriorly. Vascular calcifications are seen. Grossly the parotid glands, submandibular glands and thyroid gland is unremarkable. No specific abnormal lymph node enlargement identified in the visualized neck. CHEST: Hypermetabolic activity identified corresponding to the mass in the right lung with maximum SUV value of 18.7. This lesion is in the inferior central right upper lobe which is perihilar and again measures approximately 3.0 x 3.0 cm on axial CT image 70. Mild adjacent stranding. No additional areas of abnormal radiotracer uptake in the thorax including elsewhere along the lung parenchyma. No areas of abnormal uptake above mediastinal blood pool in the axillary region, hilum or mediastinum. Incidental CT findings: Emphysematous lung changes identified. Previously there is some small nodules elsewhere including in the right upper lobe measuring 7 mm. This area appears smaller today. Nodular area anteromedial right upper lobe on the previous measures 16 mm is much smaller today with some minimal opacity on series 4, image 76. No specific abnormal uptake. Minimal radiotracer seen with  maximum SUV value of 1.6. This may have been infectious or inflammatory or atelectatic. Persistent nodular areas at the right lung base with some thickening are stable but do not show any abnormal uptake. Coronary calcifications are seen. Heart nonenlarged. Vascular calcifications along the thoracic aorta. ABDOMEN/PELVIS: There is physiologic distribution radiotracer along the parenchymal organs, bowel and renal collecting systems. Mild uptake along the right diaphragmatic crus is noted, nonspecific. Incidental CT findings: Gallstones. Diffuse vascular calcifications. No bowel obstruction. Scattered colonic stool. Grossly the liver, spleen, adrenal glands and pancreas are unremarkable on this noncontrast  examination. Multiple Paddock granulomas are seen. No abnormal calcifications are seen within either kidney nor along the course of either ureter. SKELETON: No specific abnormal uptake identified in the skeleton. Degenerative changes seen along the spine. IMPRESSION: Hypermetabolic mass seen corresponding to the lesion in the right upper lobe immediately adjacent to the right hilum consistent with a malignant mass measuring 3 cm. No additional areas of abnormal radiotracer uptake at this time to suggest additional areas of disease. Small areas of nodularity elsewhere the right upper lobe on the prior examination appears smaller and less extensive today. There is some stable nodular areas at the right lung base. Simple attention on follow-up. Prominent right paratracheal node on the prior CT scan does not show any abnormal uptake. Electronically Signed   By: Adrianna Horde M.D.   On: 01/16/2024 13:36     Past medical hx Past Medical History:  Diagnosis Date   Arthritis    cervical spine   ASTHMA UNSPECIFIED WITH EXACERBATION 07/21/2010   CARPAL TUNNEL SYNDROME, BILATERAL 01/16/2008   COPD 05/08/2008   Dyspnea    intermittent. worsens with anxiety   GERD 04/18/2007   HELICOBACTER PYLORI GASTRITIS, HX OF 04/18/2007   PANIC DISORDER 04/18/2007   Pneumonia    yrs ago   TOBACCO ABUSE 07/17/2009     Social History   Tobacco Use   Smoking status: Every Day    Current packs/day: 0.75    Average packs/day: 1.5 packs/day for 50.3 years (74.5 ttl pk-yrs)    Types: Cigarettes    Start date: 1975   Smokeless tobacco: Never   Tobacco comments:    Trying nicotine  patches, made her nauseous at first, going to cut patches in half.    1 cigarette a day-01/17/2024  Vaping Use   Vaping status: Never Used  Substance Use Topics   Alcohol use: Yes    Comment: occasional   Drug use: No    Ms.Aldrete reports that she has been smoking cigarettes. She started smoking about 50 years ago. She has a 74.5  pack-year smoking history. She has never used smokeless tobacco. She reports current alcohol use. She reports that she does not use drugs.  Tobacco Cessation: Ready to quit: Not Answered Counseling given: Not Answered Tobacco comments: Trying nicotine  patches, made her nauseous at first, going to cut patches in half. 1 cigarette a day-01/17/2024 Current every day smoker. Counseled heavily x 5 minutes on the need to quit smoking completely.Given contact numbers for free nicotine  replacement therapy as well as hypnosis and acupuncture.  Past surgical hx, Family hx, Social hx all reviewed.  Current Outpatient Medications on File Prior to Visit  Medication Sig   albuterol  (VENTOLIN  HFA) 108 (90 Base) MCG/ACT inhaler INHALE 2 PUFFS INTO THE LUNGS EVERY 4 HOURS AS NEEDED FOR WHEEZING OR SHORTNESS OF BREATH.   ALPRAZolam  (XANAX ) 1 MG tablet Take 1 mg by mouth 3 (three) times daily.   cyclobenzaprine  (  FLEXERIL ) 10 MG tablet Take 10 mg by mouth.   Fluticasone -Umeclidin-Vilant (TRELEGY ELLIPTA ) 100-62.5-25 MCG/ACT AEPB Inhale 1 Inhalation into the lungs daily.   Fluticasone -Umeclidin-Vilant (TRELEGY ELLIPTA ) 100-62.5-25 MCG/ACT AEPB Inhale 1 Inhalation into the lungs daily.   Fluticasone -Umeclidin-Vilant (TRELEGY ELLIPTA ) 100-62.5-25 MCG/ACT AEPB Inhale 1 puff into the lungs daily at 2 PM.   ipratropium-albuterol  (DUONEB) 0.5-2.5 (3) MG/3ML SOLN Take 3 mLs by nebulization every 6 (six) hours as needed.   nicotine  (NICODERM CQ  - DOSED IN MG/24 HOURS) 21 mg/24hr patch Place 1 patch (21 mg total) onto the skin daily.   Oxycodone  HCl 10 MG TABS SMARTSIG:1 Tablet(s) By Mouth Every 12 Hours   predniSONE  (DELTASONE ) 10 MG tablet Take 4 tabs for 3 days, then 3 tabs for 3 days, then 2 tabs for 3 days, then 1 tab for 3 days, then 1/2 tab for 4 days.   fluticasone  furoate-vilanterol (BREO ELLIPTA ) 200-25 MCG/ACT AEPB Inhale 1 puff into the lungs daily. (Patient not taking: Reported on 01/30/2024)   gabapentin   (NEURONTIN ) 100 MG capsule Take 1 capsule (100 mg total) by mouth 2 (two) times daily. (Patient not taking: Reported on 01/30/2024)   No current facility-administered medications on file prior to visit.     Allergies  Allergen Reactions   Aspirin Other (See Comments)    REACTION: had h. pylori due to too many goody powders. told not to take any more asa    Review Of Systems:  Constitutional:   No  weight loss, night sweats,  Fevers, chills, fatigue, or  lassitude.  HEENT:   No headaches,  Difficulty swallowing,  Tooth/dental problems, or  Sore throat,                No sneezing, itching, ear ache, nasal congestion, post nasal drip,   CV:  No chest pain,  Orthopnea, PND, swelling in lower extremities, anasarca, dizziness, palpitations, syncope.   GI  No heartburn, indigestion, abdominal pain, nausea, vomiting, diarrhea, change in bowel habits, loss of appetite, bloody stools.   Resp: No shortness of breath with exertion or at rest.  No excess mucus, no productive cough,  No non-productive cough,  No coughing up of blood.  No change in color of mucus.  + wheezing.  No chest wall deformity  Skin: no rash or lesions.  GU: no dysuria, change in color of urine, no urgency or frequency.  No flank pain, no hematuria   MS:  No joint pain or swelling.  No decreased range of motion.  No back pain.  Psych:  No change in mood or affect. No depression or anxiety.  No memory loss.   Vital Signs BP 113/70 (BP Location: Left Arm, Patient Position: Sitting, Cuff Size: Normal)   Pulse 95   Ht 5\' 6"  (1.676 m)   Wt 133 lb 3.2 oz (60.4 kg)   SpO2 92%   BMI 21.50 kg/m    Physical Exam:  General- No distress,  A&Ox3, pleasant , appropriately concerned  ENT: No sinus tenderness, TM clear, pale nasal mucosa, no oral exudate,no post nasal drip, no LAN Cardiac: S1, S2, regular rate and rhythm, no murmur Chest: + wheeze/ No rales/ dullness; no accessory muscle use, no nasal flaring, no sternal  retractions Abd.: Soft Non-tender, ND, BS +, Body mass index is 21.5 kg/m.  Ext: No clubbing cyanosis, edema, no obvious deformities Neuro:  normal strength, MAE x 4, A&O x 3, appropriate Skin: No rashes, warm and dry, no obvious lesions Psych: normal mood and  behavior   Assessment/Plan New diagnosis squamous cell lung cancer Current every day smoker  COPD Plan Your biopsy is positive for squamous cell lung cancer  of the right upper lobe.  I will refer you to radiation oncology for treatment. You will get a call to get this scheduled.  I do not believe with your current lung function you are a candidate for surgery. Please let me know if you change your mind and  would like to repeat the pulmonary function tests, and be referred to surgery. Please work on quitting smoking. You can receive free nicotine  replacement therapy ( patches, gum or mints) by calling 1-800-QUIT NOW. Please call so we can get you on the path to becoming  a non-smoker. I know it is hard, but you can do this!  Other options for assistance in smoking cessation ( As covered by your insurance benefits)  Hypnosis for smoking cessation  [employer] [phone]  Acupuncture for smoking cessation  [facility] [phone]     Call if you need us  for anything. The cancer center will take great care of you. Please contact office for sooner follow up if symptoms do not improve or worsen or seek emergency care    I spent 30 minutes dedicated to the care of this patient on the date of this encounter to include pre-visit review of records, face-to-face time with the patient discussing conditions above, post visit ordering of testing, clinical documentation with the electronic health record, making appropriate referrals as documented, and communicating necessary information to the patient's healthcare team.     [name], NP 01/30/2024  2:03 PM

## 2024-01-31 ENCOUNTER — Encounter: Payer: Self-pay | Admitting: Acute Care

## 2024-01-31 NOTE — Telephone Encounter (Signed)
 Patient has seen Pain management specialist and nothing further needed

## 2024-02-01 ENCOUNTER — Other Ambulatory Visit: Payer: Self-pay

## 2024-02-01 NOTE — Progress Notes (Signed)
 The proposed treatment discussed in conference is for discussion purpose only and is not a binding recommendation.  The patients have not been physically examined, or presented with their treatment options.  Therefore, final treatment plans cannot be decided.

## 2024-02-05 ENCOUNTER — Inpatient Hospital Stay (HOSPITAL_COMMUNITY)
Admission: EM | Admit: 2024-02-05 | Discharge: 2024-02-09 | DRG: 193 | Disposition: A | Discharge status: Home Health | Attending: Internal Medicine | Admitting: Internal Medicine

## 2024-02-05 ENCOUNTER — Other Ambulatory Visit: Payer: Self-pay

## 2024-02-05 ENCOUNTER — Emergency Department (HOSPITAL_COMMUNITY)

## 2024-02-05 DIAGNOSIS — F17200 Nicotine dependence, unspecified, uncomplicated: Secondary | ICD-10-CM | POA: Diagnosis not present

## 2024-02-05 DIAGNOSIS — R918 Other nonspecific abnormal finding of lung field: Secondary | ICD-10-CM | POA: Diagnosis present

## 2024-02-05 DIAGNOSIS — F411 Generalized anxiety disorder: Secondary | ICD-10-CM | POA: Diagnosis present

## 2024-02-05 DIAGNOSIS — Z981 Arthrodesis status: Secondary | ICD-10-CM | POA: Diagnosis not present

## 2024-02-05 DIAGNOSIS — Z7951 Long term (current) use of inhaled steroids: Secondary | ICD-10-CM | POA: Diagnosis not present

## 2024-02-05 DIAGNOSIS — Z886 Allergy status to analgesic agent status: Secondary | ICD-10-CM | POA: Diagnosis not present

## 2024-02-05 DIAGNOSIS — J440 Chronic obstructive pulmonary disease with acute lower respiratory infection: Secondary | ICD-10-CM | POA: Diagnosis present

## 2024-02-05 DIAGNOSIS — Z803 Family history of malignant neoplasm of breast: Secondary | ICD-10-CM

## 2024-02-05 DIAGNOSIS — Z79899 Other long term (current) drug therapy: Secondary | ICD-10-CM | POA: Diagnosis not present

## 2024-02-05 DIAGNOSIS — E871 Hypo-osmolality and hyponatremia: Secondary | ICD-10-CM | POA: Diagnosis present

## 2024-02-05 DIAGNOSIS — J9622 Acute and chronic respiratory failure with hypercapnia: Secondary | ICD-10-CM | POA: Diagnosis present

## 2024-02-05 DIAGNOSIS — J9621 Acute and chronic respiratory failure with hypoxia: Secondary | ICD-10-CM | POA: Diagnosis present

## 2024-02-05 DIAGNOSIS — Z832 Family history of diseases of the blood and blood-forming organs and certain disorders involving the immune mechanism: Secondary | ICD-10-CM

## 2024-02-05 DIAGNOSIS — J441 Chronic obstructive pulmonary disease with (acute) exacerbation: Secondary | ICD-10-CM | POA: Diagnosis not present

## 2024-02-05 DIAGNOSIS — Z833 Family history of diabetes mellitus: Secondary | ICD-10-CM | POA: Diagnosis not present

## 2024-02-05 DIAGNOSIS — Z801 Family history of malignant neoplasm of trachea, bronchus and lung: Secondary | ICD-10-CM

## 2024-02-05 DIAGNOSIS — J189 Pneumonia, unspecified organism: Secondary | ICD-10-CM | POA: Diagnosis present

## 2024-02-05 DIAGNOSIS — E8729 Other acidosis: Secondary | ICD-10-CM | POA: Diagnosis present

## 2024-02-05 DIAGNOSIS — Z1152 Encounter for screening for COVID-19: Secondary | ICD-10-CM | POA: Diagnosis not present

## 2024-02-05 DIAGNOSIS — J449 Chronic obstructive pulmonary disease, unspecified: Secondary | ICD-10-CM | POA: Diagnosis not present

## 2024-02-05 DIAGNOSIS — C3491 Malignant neoplasm of unspecified part of right bronchus or lung: Secondary | ICD-10-CM | POA: Diagnosis present

## 2024-02-05 DIAGNOSIS — E785 Hyperlipidemia, unspecified: Secondary | ICD-10-CM | POA: Diagnosis present

## 2024-02-05 DIAGNOSIS — C3411 Malignant neoplasm of upper lobe, right bronchus or lung: Secondary | ICD-10-CM

## 2024-02-05 DIAGNOSIS — Z8249 Family history of ischemic heart disease and other diseases of the circulatory system: Secondary | ICD-10-CM

## 2024-02-05 DIAGNOSIS — Z79891 Long term (current) use of opiate analgesic: Secondary | ICD-10-CM | POA: Diagnosis not present

## 2024-02-05 DIAGNOSIS — M8448XD Pathological fracture, other site, subsequent encounter for fracture with routine healing: Secondary | ICD-10-CM | POA: Diagnosis present

## 2024-02-05 DIAGNOSIS — D7589 Other specified diseases of blood and blood-forming organs: Secondary | ICD-10-CM | POA: Diagnosis present

## 2024-02-05 DIAGNOSIS — F419 Anxiety disorder, unspecified: Secondary | ICD-10-CM | POA: Diagnosis not present

## 2024-02-05 DIAGNOSIS — Z800 Family history of malignant neoplasm of digestive organs: Secondary | ICD-10-CM

## 2024-02-05 DIAGNOSIS — Z9981 Dependence on supplemental oxygen: Secondary | ICD-10-CM | POA: Diagnosis not present

## 2024-02-05 DIAGNOSIS — R6510 Systemic inflammatory response syndrome (SIRS) of non-infectious origin without acute organ dysfunction: Secondary | ICD-10-CM | POA: Diagnosis present

## 2024-02-05 DIAGNOSIS — F17210 Nicotine dependence, cigarettes, uncomplicated: Secondary | ICD-10-CM | POA: Diagnosis present

## 2024-02-05 DIAGNOSIS — J188 Other pneumonia, unspecified organism: Secondary | ICD-10-CM

## 2024-02-05 LAB — BLOOD GAS, VENOUS
Acid-Base Excess: 3.6 mmol/L — ABNORMAL HIGH (ref 0.0–2.0)
Bicarbonate: 34.4 mmol/L — ABNORMAL HIGH (ref 20.0–28.0)
O2 Saturation: 60 %
Patient temperature: 37
pCO2, Ven: 86 mmHg (ref 44–60)
pH, Ven: 7.21 — ABNORMAL LOW (ref 7.25–7.43)
pO2, Ven: 38 mmHg (ref 32–45)

## 2024-02-05 LAB — CBC WITH DIFFERENTIAL/PLATELET
Abs Immature Granulocytes: 1.3 10*3/uL — ABNORMAL HIGH (ref 0.00–0.07)
Basophils Absolute: 0.1 10*3/uL (ref 0.0–0.1)
Basophils Relative: 0 %
Eosinophils Absolute: 0 10*3/uL (ref 0.0–0.5)
Eosinophils Relative: 0 %
HCT: 41.4 % (ref 36.0–46.0)
Hemoglobin: 12.7 g/dL (ref 12.0–15.0)
Immature Granulocytes: 4 %
Lymphocytes Relative: 9 %
Lymphs Abs: 2.9 10*3/uL (ref 0.7–4.0)
MCH: 30.8 pg (ref 26.0–34.0)
MCHC: 30.7 g/dL (ref 30.0–36.0)
MCV: 100.5 fL — ABNORMAL HIGH (ref 80.0–100.0)
Monocytes Absolute: 1.9 10*3/uL — ABNORMAL HIGH (ref 0.1–1.0)
Monocytes Relative: 6 %
Neutro Abs: 26.2 10*3/uL — ABNORMAL HIGH (ref 1.7–7.7)
Neutrophils Relative %: 81 %
Platelets: 257 10*3/uL (ref 150–400)
RBC: 4.12 MIL/uL (ref 3.87–5.11)
RDW: 13.8 % (ref 11.5–15.5)
WBC: 32.5 10*3/uL — ABNORMAL HIGH (ref 4.0–10.5)
nRBC: 0 % (ref 0.0–0.2)

## 2024-02-05 LAB — RESP PANEL BY RT-PCR (RSV, FLU A&B, COVID)  RVPGX2
Influenza A by PCR: NEGATIVE
Influenza B by PCR: NEGATIVE
Resp Syncytial Virus by PCR: NEGATIVE
SARS Coronavirus 2 by RT PCR: NEGATIVE

## 2024-02-05 LAB — COMPREHENSIVE METABOLIC PANEL WITH GFR
ALT: 13 U/L (ref 0–44)
AST: 12 U/L — ABNORMAL LOW (ref 15–41)
Albumin: 3.3 g/dL — ABNORMAL LOW (ref 3.5–5.0)
Alkaline Phosphatase: 83 U/L (ref 38–126)
Anion gap: 10 (ref 5–15)
BUN: 14 mg/dL (ref 8–23)
CO2: 31 mmol/L (ref 22–32)
Calcium: 8.8 mg/dL — ABNORMAL LOW (ref 8.9–10.3)
Chloride: 93 mmol/L — ABNORMAL LOW (ref 98–111)
Creatinine, Ser: 0.7 mg/dL (ref 0.44–1.00)
GFR, Estimated: >60 mL/min (ref >60)
Glucose, Bld: 129 mg/dL — ABNORMAL HIGH (ref 70–99)
Potassium: 3.9 mmol/L (ref 3.5–5.1)
Sodium: 134 mmol/L — ABNORMAL LOW (ref 135–145)
Total Bilirubin: 0.8 mg/dL (ref 0.0–1.2)
Total Protein: 7.3 g/dL (ref 6.5–8.1)

## 2024-02-05 LAB — BLOOD GAS, ARTERIAL
Acid-Base Excess: 6 mmol/L — ABNORMAL HIGH (ref 0.0–2.0)
Bicarbonate: 33.3 mmol/L — ABNORMAL HIGH (ref 20.0–28.0)
Drawn by: 20012
FIO2: 32 %
O2 Content: 3 L/min
O2 Saturation: 97.5 %
Patient temperature: 37.1
pCO2 arterial: 59 mmHg — ABNORMAL HIGH (ref 32–48)
pH, Arterial: 7.36 (ref 7.35–7.45)
pO2, Arterial: 78 mmHg — ABNORMAL LOW (ref 83–108)

## 2024-02-05 LAB — BRAIN NATRIURETIC PEPTIDE: B Natriuretic Peptide: 186.8 pg/mL — ABNORMAL HIGH (ref 0.0–100.0)

## 2024-02-05 LAB — MAGNESIUM: Magnesium: 2.9 mg/dL — ABNORMAL HIGH (ref 1.7–2.4)

## 2024-02-05 LAB — MRSA NEXT GEN BY PCR, NASAL: MRSA by PCR Next Gen: NOT DETECTED

## 2024-02-05 MED ORDER — ALPRAZOLAM 0.5 MG PO TABS: 0.5000 mg | ORAL_TABLET | Freq: Three times a day (TID) | ORAL | Status: DC | PRN

## 2024-02-05 MED ORDER — ORAL CARE MOUTH RINSE: 15.0000 mL | OROMUCOSAL | Status: DC | PRN

## 2024-02-05 MED ORDER — PANTOPRAZOLE SODIUM 40 MG IV SOLR: 40.0000 mg | INTRAVENOUS | Status: DC

## 2024-02-05 MED ORDER — CYCLOBENZAPRINE HCL 10 MG PO TABS
10.0000 mg | ORAL_TABLET | Freq: Three times a day (TID) | ORAL | Status: DC | PRN
  Administered 2024-02-07 – 2024-02-09 (×4): 10 mg via ORAL
  Filled 2024-02-05 (×4): qty 1

## 2024-02-05 MED ORDER — ACETAMINOPHEN 325 MG PO TABS
650.0000 mg | ORAL_TABLET | Freq: Four times a day (QID) | ORAL | Status: DC | PRN
  Administered 2024-02-07 – 2024-02-09 (×4): 650 mg via ORAL
  Filled 2024-02-05 (×4): qty 2

## 2024-02-05 MED ORDER — SENNOSIDES-DOCUSATE SODIUM 8.6-50 MG PO TABS
2.0000 | ORAL_TABLET | Freq: Two times a day (BID) | ORAL | Status: DC
  Administered 2024-02-05 – 2024-02-07 (×5): 2 via ORAL
  Filled 2024-02-05 (×7): qty 2

## 2024-02-05 MED ORDER — ENOXAPARIN SODIUM 40 MG/0.4ML IJ SOSY
40.0000 mg | PREFILLED_SYRINGE | INTRAMUSCULAR | Status: DC
  Administered 2024-02-05 – 2024-02-08 (×4): 40 mg via SUBCUTANEOUS
  Filled 2024-02-05 (×4): qty 0.4

## 2024-02-05 MED ORDER — SODIUM CHLORIDE 0.9 % IV SOLN
1.0000 g | INTRAVENOUS | Status: AC
  Administered 2024-02-06 – 2024-02-09 (×4): 1 g via INTRAVENOUS
  Filled 2024-02-05 (×4): qty 10

## 2024-02-05 MED ORDER — CHLORHEXIDINE GLUCONATE CLOTH 2 % EX PADS
6.0000 | MEDICATED_PAD | Freq: Every day | CUTANEOUS | Status: DC
  Administered 2024-02-05: 6 via TOPICAL

## 2024-02-05 MED ORDER — ORAL CARE MOUTH RINSE
15.0000 mL | OROMUCOSAL | Status: DC
  Administered 2024-02-06 – 2024-02-09 (×10): 15 mL via OROMUCOSAL

## 2024-02-05 MED ORDER — ALPRAZOLAM 0.5 MG PO TABS
1.0000 mg | ORAL_TABLET | Freq: Three times a day (TID) | ORAL | Status: DC | PRN
  Administered 2024-02-06 – 2024-02-09 (×9): 1 mg via ORAL
  Filled 2024-02-05 (×10): qty 2

## 2024-02-05 MED ORDER — METHYLPREDNISOLONE SODIUM SUCC 125 MG IJ SOLR
60.0000 mg | Freq: Every day | INTRAMUSCULAR | Status: DC
  Administered 2024-02-06: 60 mg via INTRAVENOUS
  Filled 2024-02-05: qty 2

## 2024-02-05 MED ORDER — OXYCODONE HCL 5 MG PO TABS
10.0000 mg | ORAL_TABLET | Freq: Two times a day (BID) | ORAL | Status: DC | PRN
  Administered 2024-02-06 – 2024-02-09 (×7): 10 mg via ORAL
  Filled 2024-02-05 (×7): qty 2

## 2024-02-05 MED ORDER — LORAZEPAM 2 MG/ML IJ SOLN
0.5000 mg | Freq: Once | INTRAMUSCULAR | Status: AC
  Administered 2024-02-05: 0.5 mg via INTRAVENOUS
  Filled 2024-02-05: qty 1

## 2024-02-05 MED ORDER — NICOTINE 21 MG/24HR TD PT24
21.0000 mg | MEDICATED_PATCH | Freq: Every day | TRANSDERMAL | Status: DC
  Administered 2024-02-05 – 2024-02-09 (×5): 21 mg via TRANSDERMAL
  Filled 2024-02-05 (×5): qty 1

## 2024-02-05 MED ORDER — PANTOPRAZOLE SODIUM 40 MG IV SOLR
40.0000 mg | Freq: Every day | INTRAVENOUS | Status: DC
  Administered 2024-02-05: 40 mg via INTRAVENOUS
  Filled 2024-02-05: qty 10

## 2024-02-05 MED ORDER — SODIUM CHLORIDE 0.9 % IV SOLN
500.0000 mg | INTRAVENOUS | Status: AC
  Administered 2024-02-06 – 2024-02-07 (×2): 500 mg via INTRAVENOUS
  Filled 2024-02-05 (×2): qty 5

## 2024-02-05 MED ORDER — SODIUM CHLORIDE 0.9 % IV BOLUS: 250.0000 mL | Freq: Once | INTRAVENOUS | Status: DC

## 2024-02-05 MED ORDER — IPRATROPIUM-ALBUTEROL 0.5-2.5 (3) MG/3ML IN SOLN
3.0000 mL | RESPIRATORY_TRACT | Status: DC
  Administered 2024-02-05 – 2024-02-07 (×13): 3 mL via RESPIRATORY_TRACT
  Filled 2024-02-05 (×12): qty 3

## 2024-02-05 MED ORDER — POLYETHYLENE GLYCOL 3350 17 G PO PACK: 17.0000 g | PACK | Freq: Every day | ORAL | Status: DC | PRN

## 2024-02-05 MED ORDER — NICOTINE POLACRILEX 2 MG MT GUM: 2.0000 mg | CHEWING_GUM | OROMUCOSAL | Status: DC | PRN

## 2024-02-05 MED ORDER — SODIUM CHLORIDE 0.9% FLUSH
3.0000 mL | Freq: Two times a day (BID) | INTRAVENOUS | Status: DC
  Administered 2024-02-06 – 2024-02-09 (×7): 3 mL via INTRAVENOUS

## 2024-02-05 MED ORDER — SODIUM CHLORIDE 0.9 % IV SOLN
500.0000 mg | Freq: Once | INTRAVENOUS | Status: AC
  Administered 2024-02-05: 500 mg via INTRAVENOUS
  Filled 2024-02-05: qty 5

## 2024-02-05 MED ORDER — ACETAMINOPHEN 650 MG RE SUPP: 650.0000 mg | Freq: Four times a day (QID) | RECTAL | Status: DC | PRN

## 2024-02-05 MED ORDER — FLUTICASONE FUROATE-VILANTEROL 200-25 MCG/ACT IN AEPB
1.0000 | INHALATION_SPRAY | Freq: Every day | RESPIRATORY_TRACT | Status: DC
  Administered 2024-02-06 – 2024-02-09 (×4): 1 via RESPIRATORY_TRACT
  Filled 2024-02-05: qty 28

## 2024-02-05 MED ORDER — ALBUTEROL SULFATE (2.5 MG/3ML) 0.083% IN NEBU
2.5000 mg | INHALATION_SOLUTION | Freq: Once | RESPIRATORY_TRACT | Status: AC
  Administered 2024-02-05: 2.5 mg via RESPIRATORY_TRACT
  Filled 2024-02-05: qty 3

## 2024-02-05 MED ORDER — SODIUM CHLORIDE 0.9 % IV SOLN
1.0000 g | Freq: Once | INTRAVENOUS | Status: AC
  Administered 2024-02-05: 1 g via INTRAVENOUS
  Filled 2024-02-05: qty 10

## 2024-02-05 MED ORDER — SODIUM CHLORIDE 0.9 % IV BOLUS
250.0000 mL | Freq: Once | INTRAVENOUS | Status: AC
  Administered 2024-02-05: 250 mL via INTRAVENOUS

## 2024-02-05 NOTE — Assessment & Plan Note (Signed)
 Is chronic, patient takes Xanax  at home, 1 mg 3 times a day.  Corroborated by PDMP

## 2024-02-05 NOTE — Assessment & Plan Note (Signed)
 Baseline is 1.5 - 2lpm oxygen with active smoking, trying to quit. Review of ABG/VBG suggest some chronic hypercapnic resp failure with now acute hypercapnic resp failure s.p bipap with resolution down to her chronic hypercapniea. Although still having acute on chronic hypoxic , requiring 4 lpm oxygen. S.p duoneb, salumedorl and magnesium  in EMS.  Continue with methylprednisolone  60 mg daily with GI prophylaxis.  DuoNeb every 4 hourly.  Acting beta adrenergic therapy.  Inhaled corticosteroids ordered.  Empiric ceftriaxone  and azithromycin  ordered. Patinet being monitoirng in Step down unit overnight.

## 2024-02-05 NOTE — ED Triage Notes (Signed)
 Pt BIBA from home for shob and malaise since Saturday. Using home neb with no relief. 80s room air. Hx copd, emphysema, recent lung cx diagnosis. Endorse fever at home, denies urinary sx. 2 duoneg, 125mg  solumedrol, 2g mag en route 18 lf.   104/76 HR 130s

## 2024-02-05 NOTE — Assessment & Plan Note (Signed)
 [name]

## 2024-02-05 NOTE — Assessment & Plan Note (Signed)
 Is chronic, continue with oxycodone  10 mg every 12 hours as needed for pain.  With acetaminophen  available in between.  Continue with Flexeril  as needed for spasm.  Patient reports not taking gabapentin .

## 2024-02-05 NOTE — Progress Notes (Signed)
   02/05/24 1641  BiPAP/CPAP/SIPAP  $ Non-Invasive Ventilator  Non-Invasive Vent Set Up;Non-Invasive Vent Initial  $ Face Mask Medium Yes  BiPAP/CPAP/SIPAP Pt Type Adult  BiPAP/CPAP/SIPAP (S)  SERVO (12/6, 40%.)  Mask Type Full face mask  Set Rate 0 breaths/min (NIV/PS)  Respiratory Rate 19 breaths/min  Flow Rate 2 lpm  Patient Home Machine No  Patient Home Mask No  Patient Home Tubing No  Auto Titrate No  Press High Alarm 20 cmH2O  Nasal massage performed No (comment)  CPAP/SIPAP surface wiped down Yes  Device Plugged into RED Power Outlet Yes  Oxygen Percent 40 %  BiPAP/CPAP /SiPAP Vitals  Pulse Rate (!) 135  Resp (!) 25  BP 98/78  SpO2 91 %  Bilateral Breath Sounds Coarse crackles  MEWS Score/Color  MEWS Score 5  MEWS Score Color Red

## 2024-02-05 NOTE — ED Notes (Signed)
 Patient requested BIPAP to be off. Respiratory informed.

## 2024-02-05 NOTE — ED Notes (Signed)
 Patient very anxious and unable to tolerate BIPAP. Hospitalist informed and ordered ABG. Respiratory informed.

## 2024-02-05 NOTE — ED Provider Notes (Signed)
 [place] EMERGENCY DEPARTMENT AT [facility] Provider Note   CSN: [id number] Arrival date & time: 02/05/24  1413     History  Chief Complaint  Patient presents with   Shortness of Breath    Kaitlyn BRISON is a 63 y.o. female.  HPI Patient presents for shortness of breath.  Medical history includes asthma, COPD, lung cancer, GERD, anxiety, anemia HLD, migraines.  Lung mass was recently found on ED visit.  She followed up with pulmonology 6 days ago.  She underwent PET scan which showed hypermetabolic mass.  At baseline, she uses 1.5 L of oxygen.  She underwent bronchoscopy with biopsies.  Biopsies were positive for squamous cell lung cancer.  She is unlikely a candidate for surgery given her continued smoking.  Plan is for follow-up with radiation oncology.  Over the past 2 days, she has had worsening shortness of breath.  This has been refractory to home DuoNeb treatments.  EMS noted hypoxia with SpO2 of 88% as well as tachycardia in the 140s.  She was given 2 breathing treatments and 125 mg of Solu-Medrol  prior to arrival.    Home Medications Prior to Admission medications   Medication Sig Start Date End Date Taking? Authorizing Provider  albuterol  (VENTOLIN  HFA) 108 (90 Base) MCG/ACT inhaler INHALE 2 PUFFS INTO THE LUNGS EVERY 4 HOURS AS NEEDED FOR WHEEZING OR SHORTNESS OF BREATH. 12/14/23   Burchette, Marijean Shouts, MD  ALPRAZolam  (XANAX ) 1 MG tablet Take 1 mg by mouth 3 (three) times daily. 12/18/23   [provider]  cyclobenzaprine  (FLEXERIL ) 10 MG tablet Take 10 mg by mouth.    [provider]  fluticasone  furoate-vilanterol (BREO ELLIPTA ) 200-25 MCG/ACT AEPB Inhale 1 puff into the lungs daily. Patient not taking: Reported on 01/30/2024 12/28/23   [name], DO  Fluticasone -Umeclidin-Vilant (TRELEGY ELLIPTA ) 100-62.5-25 MCG/ACT AEPB Inhale 1 Inhalation into the lungs daily. 01/17/24   [name], NP  Fluticasone -Umeclidin-Vilant (TRELEGY ELLIPTA )  100-62.5-25 MCG/ACT AEPB Inhale 1 Inhalation into the lungs daily. 01/17/24 01/11/25  [name], NP  Fluticasone -Umeclidin-Vilant (TRELEGY ELLIPTA ) 100-62.5-25 MCG/ACT AEPB Inhale 1 puff into the lungs daily at 2 PM. 01/17/24   [name], NP  gabapentin  (NEURONTIN ) 100 MG capsule Take 1 capsule (100 mg total) by mouth 2 (two) times daily. Patient not taking: Reported on 01/30/2024 01/23/24   Denson Flake, MD  ipratropium-albuterol  (DUONEB) 0.5-2.5 (3) MG/3ML SOLN Take 3 mLs by nebulization every 6 (six) hours as needed. 06/13/23   [name], [name], MD  nicotine  (NICODERM CQ  - DOSED IN MG/24 HOURS) 21 mg/24hr patch Place 1 patch (21 mg total) onto the skin daily. 12/29/23   [name], DO  Oxycodone  HCl 10 MG TABS SMARTSIG:1 Tablet(s) By Mouth Every 12 Hours    [provider]  predniSONE  (DELTASONE ) 10 MG tablet Take 4 tabs for 3 days, then 3 tabs for 3 days, then 2 tabs for 3 days, then 1 tab for 3 days, then 1/2 tab for 4 days. 12/28/23   [name], DO      Allergies    Aspirin    Review of Systems   Review of Systems  Constitutional:  Positive for fatigue.  Respiratory:  Positive for chest tightness and shortness of breath.   All other systems reviewed and are negative.   Physical Exam Updated Vital Signs BP 121/64 (BP Location: Right Arm)   Pulse (!) 133   Temp 98.4 F (36.9 C) (Oral)   Resp [name])  30   SpO2 98%  Physical Exam Vitals and nursing note reviewed.  Constitutional:      General: She is not in acute distress.    Appearance: Normal appearance. She is well-developed. She is ill-appearing. She is not toxic-appearing or diaphoretic.  HENT:     Head: Normocephalic and atraumatic.     Right Ear: External ear normal.     Left Ear: External ear normal.     Nose: Nose normal.     Mouth/Throat:     Mouth: Mucous membranes are moist.  Eyes:     Extraocular Movements: Extraocular movements intact.     Conjunctiva/sclera: Conjunctivae normal.   Cardiovascular:     Rate and Rhythm: Normal rate and regular rhythm.     Heart sounds: No murmur heard. Pulmonary:     Effort: Tachypnea and accessory muscle usage present. No respiratory distress.     Breath sounds: Decreased air movement present. Decreased breath sounds and wheezing present.  Abdominal:     General: There is no distension.     Palpations: Abdomen is soft.     Tenderness: There is no abdominal tenderness.  Musculoskeletal:        General: No swelling.     Cervical back: Normal range of motion and neck supple.  Skin:    General: Skin is warm and dry.     Coloration: Skin is not jaundiced or pale.  Neurological:     General: No focal deficit present.     Mental Status: She is alert and oriented to person, place, and time.  Psychiatric:        Mood and Affect: Mood normal.        Behavior: Behavior normal.     ED Results / Procedures / Treatments   Labs (all labs ordered are listed, but only abnormal results are displayed) Labs Reviewed  COMPREHENSIVE METABOLIC PANEL WITH GFR - Abnormal; Notable for the following components:      Result Value   Sodium 134 (*)    Chloride 93 (*)    Glucose, Bld 129 (*)    Calcium  8.8 (*)    Albumin 3.3 (*)    AST 12 (*)    All other components within normal limits  BRAIN NATRIURETIC PEPTIDE - Abnormal; Notable for the following components:   B Natriuretic Peptide 186.8 (*)    All other components within normal limits  BLOOD GAS, VENOUS - Abnormal; Notable for the following components:   pH, Ven 7.21 (*)    pCO2, Ven 86 (*)    Bicarbonate 34.4 (*)    Acid-Base Excess 3.6 (*)    All other components within normal limits  CBC WITH DIFFERENTIAL/PLATELET - Abnormal; Notable for the following components:   WBC 32.5 (*)    MCV 100.5 (*)    Neutro Abs 26.2 (*)    Monocytes Absolute 1.9 (*)    Abs Immature Granulocytes 1.30 (*)    All other components within normal limits  MAGNESIUM  - Abnormal; Notable for the following  components:   Magnesium  2.9 (*)    All other components within normal limits  RESP PANEL BY RT-PCR (RSV, FLU A&B, COVID)  [id number]    EKG None  Radiology No results found.  Procedures Procedures    Medications Ordered in ED Medications  cefTRIAXone  (ROCEPHIN ) 1 g in sodium chloride  0.9 % 100 mL IVPB (has no administration in time range)  azithromycin  (ZITHROMAX ) 500 mg in sodium chloride  0.9 % 250 mL IVPB (has  no administration in time range)  albuterol  (PROVENTIL ) (2.5 MG/3ML) 0.083% nebulizer solution 2.5 mg (2.5 mg Nebulization Given 02/05/24 1512)    ED Course/ Medical Decision Making/ A&P                                 Medical Decision Making Amount and/or Complexity of Data Reviewed Labs: ordered. Radiology: ordered.  Risk Prescription drug management.   This patient presents to the ED for concern of shortness of breath, this involves an extensive number of treatment options, and is a complaint that carries with it a high risk of complications and morbidity.  The differential diagnosis includes COPD exacerbation, pneumonia, worsening of cancer, acidosis, anemia, CHF   Co morbidities that complicate the patient evaluation  asthma, COPD, lung cancer, GERD, anxiety, anemia HLD, migraines   Additional history obtained:  Additional history obtained from EMS External records from outside source obtained and reviewed including EMR   Lab Tests:  I Ordered, and personally interpreted labs.  The pertinent results include: Large leukocytosis and respiratory acidosis are present.   Imaging Studies ordered:  I ordered imaging studies including chest x-ray I independently visualized and interpreted imaging which showed (pending at time of signout) I agree with the radiologist interpretation   Cardiac Monitoring: / EKG:  The patient was maintained on a cardiac monitor.  I personally viewed and interpreted the cardiac monitored which showed an underlying rhythm of:  Sinus rhythm   Problem List / ED Course / Critical interventions / Medication management  Patient presenting for 2 days of worsening shortness of breath.  On arrival, patient is tachypneic with mildly increased work of breathing.  She is able to speak in complete sentences.  She is finishing her second DuoNeb at this time.  She has continued diminished breath sounds and wheezing on lung auscultation.  Continued albuterol  was ordered.  Initial lab work notable for large leukocytosis.  Patient remains tachycardic and tachypneic at this time.  Antibiotics were ordered for empiric treatment of pneumonia.  She required increased oxygen.  Blood gases notable for respiratory acidosis.  BiPAP was ordered.  Chest x-ray pending at time of signout.  Care of patient was signed out to oncoming ED provider. I ordered medication including albuterol , ceftriaxone , azithromycin  for COPD exacerbation Reevaluation of the patient after these medicines showed that the patient improved I have reviewed the patients home medicines and have made adjustments as needed   Social Determinants of Health:  Has access to outpatient care   CRITICAL CARE Performed by: Iva Mariner   Total critical care time: 34 minutes  Critical care time was exclusive of separately billable procedures and treating other patients.  Critical care was necessary to treat or prevent imminent or life-threatening deterioration.  Critical care was time spent personally by me on the following activities: development of treatment plan with patient and/or surrogate as well as nursing, discussions with consultants, evaluation of patient's response to treatment, examination of patient, obtaining history from patient or surrogate, ordering and performing treatments and interventions, ordering and review of laboratory studies, ordering and review of radiographic studies, pulse oximetry and re-evaluation of patient's condition.        Final Clinical  Impression(s) / ED Diagnoses Final diagnoses:  COPD exacerbation (HCC)  Acute on chronic respiratory failure with hypoxia and hypercapnia (HCC)    Rx / DC Orders ED Discharge Orders     None  Iva Mariner, MD 02/05/24 [phone]
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
## 2024-02-05 NOTE — H&P (Signed)
 History and Physical    Patient: [name] ZOX:[id number] DOB: Mar 11, 1961 DOA: 02/05/2024 DOS: the patient was seen and examined on 02/05/2024 PCP: [name], MD  Patient coming from: Home  Chief Complaint:  Chief Complaint  Patient presents with   Shortness of Breath   HPI: [name] is a 63 y.o. female with medical history significant of COPD, patient does use oxygen at home due to remitted at nighttime or during periods of exertion.  Patient was in her usual state of health till approximately 2 days ago when she reports show new onset of worsening sensation of shortness of breath.  It was present at rest worse with exertion and got progressively worse suspect patient could not sleep all night last night.  Patient does not report much new cough or expectoration any new chest pain or any leg swelling or fever.  Her major complaint is really just shortness of breath that got worse.  Daughter at the bedside corroborates above states that the patient was gasping for breath and could not speak in full sentences at home.  EMS was called and patient was found to be hypoxic to 80% SpO2 on her usual 2 L/min of supplementary oxygen.  Patient was given Solu-Medrol  and magnesium  as well as DuoNeb therapy by EMS.  Patient had oxygen uptitrated to 6 L/min a VBG done in the ER revealed CO2 retention with pH of 7.21.  Patient is status post noninvasive ventilatory therapy.  Patient finally requested that the BiPAP be removed.  Repeat ABG shows a pH of 7.36.  Medical evaluation is sought.   Review of Systems: As mentioned in the history of present illness. All other systems reviewed and are negative. Past Medical History:  Diagnosis Date   Arthritis    cervical spine   ASTHMA UNSPECIFIED WITH EXACERBATION 07/21/2010   CARPAL TUNNEL SYNDROME, BILATERAL 01/16/2008   COPD 05/08/2008   Dyspnea    intermittent. worsens with anxiety   GERD 04/18/2007   HELICOBACTER PYLORI GASTRITIS, HX OF  04/18/2007   PANIC DISORDER 04/18/2007   Pneumonia    yrs ago   TOBACCO ABUSE 07/17/2009   Past Surgical History:  Procedure Laterality Date   ANTERIOR CERVICAL DECOMP/DISCECTOMY FUSION N/A 11/19/2021   Procedure: CERVICAL FOUR-FIVE, CERVICAL FIVE-SIX, CERVICAL SIX-SEVEN ANTERIOR CERVICAL DECOMPRESSION/DISCECTOMY FUSION;  Surgeon: Isadora Mar, MD;  Location: [facility] OR;  Service: Neurosurgery;  Laterality: N/A;   APPENDECTOMY     BRONCHIAL BIOPSY  01/23/2024   Procedure: BRONCHOSCOPY, WITH BIOPSY;  Surgeon: Denson Flake, MD;  Location: [facility] ENDOSCOPY;  Service: Pulmonary;;   BRONCHIAL NEEDLE ASPIRATION BIOPSY  01/23/2024   Procedure: BRONCHOSCOPY, WITH NEEDLE ASPIRATION BIOPSY;  Surgeon: Denson Flake, MD;  Location: [facility] ENDOSCOPY;  Service: Pulmonary;;   OOPHORECTOMY     POSTERIOR CERVICAL FUSION/FORAMINOTOMY N/A 03/25/2022   Procedure: Cervical Three-Cervical Seven Posterior Cervical Fusion with Lateral Mass Fixation;  Surgeon: Isadora Mar, MD;  Location: [facility] OR;  Service: Neurosurgery;  Laterality: N/A;   Social History:  reports that she has been smoking cigarettes. She started smoking about 50 years ago. She has a 74.5 pack-year smoking history. She has never used smokeless tobacco. She reports current alcohol use. She reports that she does not use drugs.  Allergies  Allergen Reactions   Aspirin Other (See Comments)    REACTION: had h. pylori due to too many goody powders. told not to take any more asa    Family History  Problem Relation Age of  Onset   Diabetes Mother    Cancer Father        lung smoke    Heart disease Father    Clotting disorder Brother    Breast cancer Paternal Aunt    Stomach cancer Paternal Uncle    Esophageal cancer Neg Hx    Colon cancer Neg Hx     Prior to Admission medications   Medication Sig Start Date End Date Taking? Authorizing Provider  albuterol  (VENTOLIN  HFA) 108 (90 Base) MCG/ACT inhaler INHALE 2 PUFFS INTO THE LUNGS EVERY 4 HOURS AS  NEEDED FOR WHEEZING OR SHORTNESS OF BREATH. 12/14/23   Burchette, Marijean Shouts, MD  ALPRAZolam  (XANAX ) 1 MG tablet Take 1 mg by mouth 3 (three) times daily. 12/18/23   [provider]  cyclobenzaprine  (FLEXERIL ) 10 MG tablet Take 10 mg by mouth.    [provider]  fluticasone  furoate-vilanterol (BREO ELLIPTA ) 200-25 MCG/ACT AEPB Inhale 1 puff into the lungs daily. Patient not taking: Reported on 01/30/2024 12/28/23   [name], DO  Fluticasone -Umeclidin-Vilant (TRELEGY ELLIPTA ) 100-62.5-25 MCG/ACT AEPB Inhale 1 Inhalation into the lungs daily. 01/17/24   [name], NP  Fluticasone -Umeclidin-Vilant (TRELEGY ELLIPTA ) 100-62.5-25 MCG/ACT AEPB Inhale 1 Inhalation into the lungs daily. 01/17/24 01/11/25  [name], NP  Fluticasone -Umeclidin-Vilant (TRELEGY ELLIPTA ) 100-62.5-25 MCG/ACT AEPB Inhale 1 puff into the lungs daily at 2 PM. 01/17/24   [name], NP  gabapentin  (NEURONTIN ) 100 MG capsule Take 1 capsule (100 mg total) by mouth 2 (two) times daily. Patient not taking: Reported on 01/30/2024 01/23/24   Denson Flake, MD  ipratropium-albuterol  (DUONEB) 0.5-2.5 (3) MG/3ML SOLN Take 3 mLs by nebulization every 6 (six) hours as needed. 06/13/23   [name], [name], MD  nicotine  (NICODERM CQ  - DOSED IN MG/24 HOURS) 21 mg/24hr patch Place 1 patch (21 mg total) onto the skin daily. 12/29/23   [name], DO  Oxycodone  HCl 10 MG TABS SMARTSIG:1 Tablet(s) By Mouth Every 12 Hours    [provider]  predniSONE  (DELTASONE ) 10 MG tablet Take 4 tabs for 3 days, then 3 tabs for 3 days, then 2 tabs for 3 days, then 1 tab for 3 days, then 1/2 tab for 4 days. 12/28/23   [name], DO    Physical Exam: Vitals:   02/05/24 1816 02/05/24 1838 02/05/24 1845 02/05/24 1900  BP:  (!) 112/57  (!) 121/103  Pulse:  (!) 109 (!) 114 (!) 118  Resp:  (!) 23 19 (!) 27  Temp:  98.2 F (36.8 C)    TempSrc:  Oral    SpO2: 94% 93% 90% (!) 85%  Weight:  60.3 kg    Height:  5\' 6"  (1.676 m)      General: Patient is alert and awake, gives a coherent and patient does not appear to be in any immediate distress.  However occasionally sentence has to be interrupted to take of breath. Respiratory exam: Bilateral air entry vesicular with markedly diminished expiratory and prolonged breath of breath sounds during expiration space. Cardiovascular exam S1-S2 normal, tachycardia Abd - soft non tender Extremity - warm no edmea. No focal deficit. Data Reviewed:  Labs on Admission:  Results for orders placed or performed during the hospital encounter of 02/05/24 (from the past 24 hours)  Resp panel by RT-PCR (RSV, Flu A&B, Covid) Anterior Nasal Swab     Status: None   Collection Time: 02/05/24  2:48 PM   Specimen: Anterior Nasal Swab  Result Value Ref Range  SARS Coronavirus 2 by RT PCR NEGATIVE NEGATIVE   Influenza A by PCR NEGATIVE NEGATIVE   Influenza B by PCR NEGATIVE NEGATIVE   Resp Syncytial Virus by PCR NEGATIVE NEGATIVE  Comprehensive metabolic panel     Status: Abnormal   Collection Time: 02/05/24  2:48 PM  Result Value Ref Range   Sodium 134 (L) 135 - 145 mmol/L   Potassium 3.9 3.5 - 5.1 mmol/L   Chloride 93 (L) 98 - 111 mmol/L   CO2 31 22 - 32 mmol/L   Glucose, Bld 129 (H) 70 - 99 mg/dL   BUN 14 8 - 23 mg/dL   Creatinine, Ser 1.61 0.44 - 1.00 mg/dL   Calcium  8.8 (L) 8.9 - 10.3 mg/dL   Total Protein 7.3 6.5 - 8.1 g/dL   Albumin 3.3 (L) 3.5 - 5.0 g/dL   AST 12 (L) 15 - 41 U/L   ALT 13 0 - 44 U/L   Alkaline Phosphatase 83 38 - 126 U/L   Total Bilirubin 0.8 0.0 - 1.2 mg/dL   GFR, Estimated >09 >60 mL/min   Anion gap 10 5 - 15  Brain natriuretic peptide     Status: Abnormal   Collection Time: 02/05/24  2:48 PM  Result Value Ref Range   B Natriuretic Peptide 186.8 (H) 0.0 - 100.0 pg/mL  Blood gas, venous     Status: Abnormal   Collection Time: 02/05/24  2:48 PM  Result Value Ref Range   pH, Ven 7.21 (L) 7.25 - 7.43   pCO2, Ven 86 (HH) 44 - 60 mmHg   pO2, Ven 38 32 -  45 mmHg   Bicarbonate 34.4 (H) 20.0 - 28.0 mmol/L   Acid-Base Excess 3.6 (H) 0.0 - 2.0 mmol/L   O2 Saturation 60 %   Patient temperature 37.0   CBC with Differential/Platelet     Status: Abnormal   Collection Time: 02/05/24  2:48 PM  Result Value Ref Range   WBC 32.5 (H) 4.0 - 10.5 K/uL   RBC 4.12 3.87 - 5.11 MIL/uL   Hemoglobin 12.7 12.0 - 15.0 g/dL   HCT 45.4 09.8 - 11.9 %   MCV 100.5 (H) 80.0 - 100.0 fL   MCH 30.8 26.0 - 34.0 pg   MCHC 30.7 30.0 - 36.0 g/dL   RDW 14.7 82.9 - 56.2 %   Platelets 257 150 - 400 K/uL   nRBC 0.0 0.0 - 0.2 %   Neutrophils Relative % 81 %   Neutro Abs 26.2 (H) 1.7 - 7.7 K/uL   Lymphocytes Relative 9 %   Lymphs Abs 2.9 0.7 - 4.0 K/uL   Monocytes Relative 6 %   Monocytes Absolute 1.9 (H) 0.1 - 1.0 K/uL   Eosinophils Relative 0 %   Eosinophils Absolute 0.0 0.0 - 0.5 K/uL   Basophils Relative 0 %   Basophils Absolute 0.1 0.0 - 0.1 K/uL   Immature Granulocytes 4 %   Abs Immature Granulocytes 1.30 (H) 0.00 - 0.07 K/uL  Magnesium      Status: Abnormal   Collection Time: 02/05/24  2:48 PM  Result Value Ref Range   Magnesium  2.9 (H) 1.7 - 2.4 mg/dL  Blood gas, arterial     Status: Abnormal   Collection Time: 02/05/24  6:16 PM  Result Value Ref Range   FIO2 32 %   O2 Content 3.0 L/min   Delivery systems NASAL CANNULA    pH, Arterial 7.36 7.35 - 7.45   pCO2 arterial 59 (H) 32 - 48 mmHg  pO2, Arterial 78 (L) 83 - 108 mmHg   Bicarbonate 33.3 (H) 20.0 - 28.0 mmol/L   Acid-Base Excess 6.0 (H) 0.0 - 2.0 mmol/L   O2 Saturation 97.5 %   Patient temperature 37.1    Collection site LEFT BRACHIAL    Drawn by [id number]    Allens test (pass/fail) PASS PASS   Basic Metabolic Panel: Recent Labs  Lab 02/05/24 1448  NA 134*  K 3.9  CL 93*  CO2 31  GLUCOSE 129*  BUN 14  CREATININE 0.70  CALCIUM  8.8*  MG 2.9*   Liver Function Tests: Recent Labs  Lab 02/05/24 1448  AST 12*  ALT 13  ALKPHOS 83  BILITOT 0.8  PROT 7.3  ALBUMIN 3.3*   No results for  input(s): "LIPASE", "AMYLASE" in the last 168 hours. No results for input(s): "AMMONIA" in the last 168 hours. CBC: Recent Labs  Lab 02/05/24 1448  WBC 32.5*  NEUTROABS 26.2*  HGB 12.7  HCT 41.4  MCV 100.5*  PLT 257   Cardiac Enzymes: No results for input(s): "CKTOTAL", "CKMB", "CKMBINDEX", "TROPONINIHS" in the last 168 hours.  BNP (last 3 results) No results for input(s): "PROBNP" in the last 8760 hours. CBG: No results for input(s): "GLUCAP" in the last 168 hours.  Radiological Exams on Admission:  DG Chest Port 1 View Result Date: 02/05/2024 CLINICAL DATA:  Dyspnea. EXAM: PORTABLE CHEST 1 VIEW COMPARISON:  01/23/2024. FINDINGS: Redemonstration of ill-defined faint opacity overlying the right mid lung zone which corresponds to patient's known right lung upper lobe mass. There are additional nonspecific heterogeneous opacities throughout bilateral lungs with lower lobe predominance, which are also similar to the prior study. There is new blunting of bilateral lateral costophrenic angles, which may be due to overlying lung parenchymal opacities versus pleural effusions. No pneumothorax on either side. Stable cardio-mediastinal silhouette. No acute osseous abnormalities. Old healed bilateral posterior rib fractures noted. The soft tissues are within normal limits. IMPRESSION: *Redemonstration of ill-defined opacity overlying the right mid lung zone, corresponding to patient's known right lung upper lobe mass. *There is new blunting of bilateral lateral costophrenic angles, which may be due to overlying lung parenchymal opacities versus pleural effusions. Electronically Signed   By: Beula Brunswick M.D.   On: 02/05/2024 16:32    chest X-ray   I/O last 3 completed shifts: In: 350 [IV Piggyback:350] Out: -  No intake/output data recorded.   EKG - sinus tachycardia.    Assessment and Plan: * COPD exacerbation (HCC) Baseline is 1.5 - 2lpm oxygen with active smoking, trying to quit.  Review of ABG/VBG suggest some chronic hypercapnic resp failure with now acute hypercapnic resp failure s.p bipap with resolution down to her chronic hypercapniea. Although still having acute on chronic hypoxic , requiring 4 lpm oxygen. S.p duoneb, salumedorl and magnesium  in EMS.  Continue with methylprednisolone  60 mg daily with GI prophylaxis.  DuoNeb every 4 hourly.  Acting beta adrenergic therapy.  Inhaled corticosteroids ordered.  Empiric ceftriaxone  and azithromycin  ordered. Patinet being monitoirng in Step down unit overnight.  Lung mass A lung mass was discovered during an emergency room visit for a suspected COPD flare. Imaging, including a CT scan and chest x-ray, revealed a mass in the right upper lobe of the lung. A PET scan was ordered and done 01/12/2024, and indicated hypermetabolism in the mass, suggesting active growth. The PET scan confirmed the presence of a hypermetabolic mass in the right upper lobe, measuring three millimeters. Other small areas of nodularity in  the right upper lobe appeared smaller and less extensive than they had on the earlier CT Chest, and are suspected to be infectious or inflammatory, but bear close observation moving forward. .   Pt. Underwent bronchoscopy with biopsies on 01/23/2024 . It was positive for squamous cell lung cancer .  Per daughter: Due to functional status, surgical/chemo Rx has been deferred. And patient is opted for radiation therapy - pending    Cervical spine fracture (HCC) Is chronic, continue with oxycodone  10 mg every 12 hours as needed for pain.  With acetaminophen  available in between.  Continue with Flexeril  as needed for spasm.  Patient reports not taking gabapentin .  Anxiety state Is chronic, patient takes Xanax  at home, 1 mg 3 times a day.  Corroborated by PDMP  TOBACCO ABUSE Patient is trying to quit, she has good insight into the benefits of quitting smoking including potential reduction in repeated hospitalizations from  COPD exacerbation.  Outpatient follow-up.  I did discuss mock danger of smoking with oxygen use study due to flash burns to the nose or mouth.      Advance Care Planning:   Code Status: Full Code   Consults:  Patient is well tuned in with pulmonary disease services given her recent bronchoscopies. I have sent a message to Dr. Baldwin Levee to see if he can advise regarding initiating this patient on chronic bipap therapy. Will update, once I hear back.  Family Communication: daughter at bedsdie.  Severity of Illness: The appropriate patient status for this patient is INPATIENT. Inpatient status is judged to be reasonable and necessary in order to provide the required intensity of service to ensure the patient's safety. The patient's presenting symptoms, physical exam findings, and initial radiographic and laboratory data in the context of their chronic comorbidities is felt to place them at high risk for further clinical deterioration. Furthermore, it is not anticipated that the patient will be medically stable for discharge from the hospital within 2 midnights of admission.   * I certify that at the point of admission it is my clinical judgment that the patient will require inpatient hospital care spanning beyond 2 midnights from the point of admission due to high intensity of service, high risk for further deterioration and high frequency of surveillance required.*  Author: Bennie Brave, MD 02/05/2024 7:35 PM  For on call review [email or web address].

## 2024-02-05 NOTE — Progress Notes (Signed)
 BIPAP is on standby: pt wants a Xanax  before going on the bipap, she is crying, on the phone with her sister. RT notified RN to call when pt is ready.  Reese weaned from 5 to 3 L.

## 2024-02-06 ENCOUNTER — Other Ambulatory Visit: Payer: Self-pay | Admitting: Radiation Oncology

## 2024-02-06 ENCOUNTER — Other Ambulatory Visit: Payer: Self-pay

## 2024-02-06 ENCOUNTER — Telehealth: Payer: Self-pay | Admitting: Radiation Oncology

## 2024-02-06 DIAGNOSIS — R918 Other nonspecific abnormal finding of lung field: Secondary | ICD-10-CM

## 2024-02-06 DIAGNOSIS — J9622 Acute and chronic respiratory failure with hypercapnia: Secondary | ICD-10-CM

## 2024-02-06 DIAGNOSIS — C3411 Malignant neoplasm of upper lobe, right bronchus or lung: Secondary | ICD-10-CM

## 2024-02-06 DIAGNOSIS — J189 Pneumonia, unspecified organism: Secondary | ICD-10-CM | POA: Diagnosis not present

## 2024-02-06 DIAGNOSIS — C3491 Malignant neoplasm of unspecified part of right bronchus or lung: Secondary | ICD-10-CM | POA: Diagnosis not present

## 2024-02-06 DIAGNOSIS — J441 Chronic obstructive pulmonary disease with (acute) exacerbation: Secondary | ICD-10-CM | POA: Diagnosis not present

## 2024-02-06 DIAGNOSIS — F17200 Nicotine dependence, unspecified, uncomplicated: Secondary | ICD-10-CM

## 2024-02-06 DIAGNOSIS — J9621 Acute and chronic respiratory failure with hypoxia: Secondary | ICD-10-CM | POA: Diagnosis not present

## 2024-02-06 DIAGNOSIS — F419 Anxiety disorder, unspecified: Secondary | ICD-10-CM

## 2024-02-06 HISTORY — DX: Malignant neoplasm of upper lobe, right bronchus or lung: C34.11

## 2024-02-06 LAB — CBC
HCT: 38 % (ref 36.0–46.0)
Hemoglobin: 11.4 g/dL — ABNORMAL LOW (ref 12.0–15.0)
MCH: 30.2 pg (ref 26.0–34.0)
MCHC: 30 g/dL (ref 30.0–36.0)
MCV: 100.8 fL — ABNORMAL HIGH (ref 80.0–100.0)
Platelets: 222 10*3/uL (ref 150–400)
RBC: 3.77 MIL/uL — ABNORMAL LOW (ref 3.87–5.11)
RDW: 13.5 % (ref 11.5–15.5)
WBC: 24.5 10*3/uL — ABNORMAL HIGH (ref 4.0–10.5)
nRBC: 0 % (ref 0.0–0.2)

## 2024-02-06 LAB — PROTIME-INR
INR: 1.2 (ref 0.8–1.2)
Prothrombin Time: 15 s (ref 11.4–15.2)

## 2024-02-06 LAB — BASIC METABOLIC PANEL WITH GFR
Anion gap: 7 (ref 5–15)
BUN: 16 mg/dL (ref 8–23)
CO2: 32 mmol/L (ref 22–32)
Calcium: 8.7 mg/dL — ABNORMAL LOW (ref 8.9–10.3)
Chloride: 96 mmol/L — ABNORMAL LOW (ref 98–111)
Creatinine, Ser: 0.64 mg/dL (ref 0.44–1.00)
GFR, Estimated: >60 mL/min (ref >60)
Glucose, Bld: 144 mg/dL — ABNORMAL HIGH (ref 70–99)
Potassium: 4.5 mmol/L (ref 3.5–5.1)
Sodium: 135 mmol/L (ref 135–145)

## 2024-02-06 LAB — FOLATE: Folate: 10 ng/mL (ref >5.9)

## 2024-02-06 LAB — APTT: aPTT: 38 s — ABNORMAL HIGH (ref 24–36)

## 2024-02-06 LAB — PROCALCITONIN: Procalcitonin: 0.73 ng/mL

## 2024-02-06 LAB — VITAMIN B12: Vitamin B-12: 654 pg/mL (ref 180–914)

## 2024-02-06 MED ORDER — METHYLPREDNISOLONE SODIUM SUCC 40 MG IJ SOLR
40.0000 mg | Freq: Every day | INTRAMUSCULAR | Status: AC
  Administered 2024-02-07 – 2024-02-09 (×3): 40 mg via INTRAVENOUS
  Filled 2024-02-06 (×3): qty 1

## 2024-02-06 MED ORDER — PANTOPRAZOLE SODIUM 40 MG PO TBEC
40.0000 mg | DELAYED_RELEASE_TABLET | Freq: Every day | ORAL | Status: DC
  Administered 2024-02-06 – 2024-02-08 (×3): 40 mg via ORAL
  Filled 2024-02-06 (×3): qty 1

## 2024-02-06 NOTE — Progress Notes (Signed)
Report called. Pt stable at time of transfer.

## 2024-02-06 NOTE — TOC Initial Note (Addendum)
 Transition of Care [facility]) - Initial/Assessment Note    Patient Details  Name: [name] MRN: [id number] Date of Birth: 1961-06-02  Transition of Care [facility] CM/SW Contact:    Bari Leys, RN Phone Number: 02/06/2024, 12:06 PM  Clinical Narrative:  TOC Consult for NIV. Referral to [employer], rep-Mitch, reviewed and confirmed meets criteria. Md notified of needed signature form  NIV Equipment order from ( placed on patient hard chart) and narrative for insurance medical necessity to be placed in MD progress note provided to attending.   -2:25pm Met with patent at bedside to introduce role of TOC/NCM and review for dc planning, reports she has an established PCP and pharmacy, reports currently receiving HH PT with [facility], reports she lives on her family land with good support from family members, reports she has all the home DME she needs, RW, W/C, etc. Patient reports she is on home 87 with [employer] and will need travel tank for transport at discharge. NCM call to [employer], rep-Jermaine, confirmed patient on service for home 02 and will bring travel tank to bedside. TOC will continue to follow.                      Patient Goals and CMS Choice            Expected Discharge Plan and Services                                              Prior Living Arrangements/Services                       Activities of Daily Living      Permission Sought/Granted                  Emotional Assessment              Admission diagnosis:  COPD exacerbation (HCC) [J44.1] Acute on chronic respiratory failure with hypoxia and hypercapnia (HCC) [J96.21, J96.22] Patient Active Problem List   Diagnosis Date Noted   COPD exacerbation (HCC) 02/05/2024   Acute on chronic respiratory failure with hypoxia and hypercapnia (HCC) 12/24/2023   Lung mass 12/24/2023   Cervical post-laminectomy syndrome 05/18/2022   Adhesive capsulitis of left knee 05/18/2022    Fracture of multiple ribs 12/08/2021   Hypoxia 12/08/2021   Acute blood loss anemia    Generalized anxiety disorder    Neuropathic pain    Trauma 11/23/2021   S/P cervical spinal fusion 11/19/2021   Cervical spine fracture (HCC) 11/16/2021   Syncope 06/10/2019   Hypokalemia 06/10/2019   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   Laryngopharyngeal reflux (LPR) 06/13/2018   Depression, major, single episode, severe (HCC) 11/15/2017   Anxiety state 01/02/2017   Maxillary sinusitis, acute 12/30/2015   Migraine 12/30/2015   Special screening for malignant neoplasms, colon 04/14/2014   Chronic low back pain 05/23/2013   Dyslipidemia 11/14/2011   Hyperglycemia 11/14/2011   COPD with acute exacerbation (HCC) 08/26/2011   Low back pain 12/22/2010   ASTHMA UNSPECIFIED WITH EXACERBATION 07/21/2010   Tobacco dependence 07/17/2009   CERVICALGIA 02/20/2009   COPD type B (HCC) 05/08/2008   CARPAL TUNNEL SYNDROME, BILATERAL 01/16/2008   ABDOMIAL BRUIT 04/19/2007   Panic disorder 04/18/2007   GERD 04/18/2007   DEGENERATIVE JOINT DISEASE, GENERALIZED 04/18/2007   HELICOBACTER PYLORI GASTRITIS,  HX OF 04/18/2007   PCP:  [name], MD Pharmacy:   [facility] [phone] - [place], [place] - [address] [place] [address] Phone: [phone] Fax: [phone]  [facility] [address] [place] [address] Phone: [phone] Fax: [phone]     Social Drivers of Health (SDOH) Social History: SDOH Screenings   Food Insecurity: No Food Insecurity (02/06/2024)  Housing: Low Risk  (02/06/2024)  Transportation Needs: No Transportation Needs (02/06/2024)  Utilities: Not At Risk (02/06/2024)  Depression (PHQ2-9): Low Risk  (11/02/2022)  Tobacco Use: High Risk (01/31/2024)   SDOH Interventions:     Readmission Risk Interventions    02/06/2024   12:06 PM  Readmission Risk  Prevention Plan  Transportation Screening Complete  PCP or Specialist Appt within 5-7 Days Complete  Home Care Screening Complete  Medication Review (RN CM) Complete

## 2024-02-06 NOTE — Progress Notes (Signed)
   02/06/24 2335  BiPAP/CPAP/SIPAP  BiPAP/CPAP/SIPAP Pt Type Adult  BiPAP/CPAP/SIPAP SERVO  Mask Type Full face mask  Mask Size Medium  Respiratory Rate 26 breaths/min  IPAP 12 cmH20  EPAP 6 cmH2O  PEEP 6 cmH20  FiO2 (%) 40 %  Leak 61  Peak Inspiratory Pressure (PIP) 13  Tidal Volume (Vt) 429  Patient Home Machine No  Patient Home Mask No  Patient Home Tubing No  Auto Titrate No  Press High Alarm 40 cmH2O  Device Plugged into RED Power Outlet Yes  Oxygen Percent 40 %  BiPAP/CPAP /SiPAP Vitals  Pulse Rate 98  Resp (!) 26  SpO2 98 %

## 2024-02-06 NOTE — Consult Note (Signed)
 NAME:  [name], MRN:  [id number], DOB:  Jan 09, 1961, LOS: 1 ADMISSION DATE:  02/05/2024, CONSULTATION DATE:  02/06/24  REFERRING MD:  Ariel Begun CHIEF COMPLAINT:  Dyspnea   History of Present Illness:  [name] is a 63 y.o. female who has a PMH as outlined below including but not limited to COPD (PFTs from 2019 with FEV1 1.28/47% predicted, ratio 57/73% predicted), recent diagnosis squamous cell carcinoma after biopsy on 01/23/2024 (Dr. Baldwin Levee), ongoing tobacco dependence, chronic hypoxic and hypercapnic respiratory failure on 2 L of oxygen.  She had been referred to radiation oncology but has not yet seen them.  Due to her pulmonary limitations based off PFTs from 6 years ago she, she was deemed to not be a appropriate surgical candidate and she also was not interested in surgery.  She is at 2 admissions in the past 6 months due to COPD exacerbation.  On both admissions, her blood gases have demonstrated hypercapnia with CO2 in the 80 range (pH 7.3 and 7.21).  She was brought to [facility] ED on 4/28 with dyspnea, hypoxia, COPD exacerbation.  ABG in the ED demonstrated acute on chronic hypoxic hypercapnic respiratory failure (7.36/59/78).  She was admitted by the hospitalist service and on 4/29, PCCM was asked to see in consultation for consideration of BiPAP/NIV due to recurrent admissions for acute on chronic hypoxic and hypercapnic respiratory failure.  She did wear BiPAP overnight on 4/28 and did not like the feel of it.  She is not unsure whether she will be able to tolerate this long-term however, she was willing to try this while inpatient.  Tells me that she has decided that she will quit smoking.  Has had ongoing cough, no change in sputum production.  No recent fever/chills/sweats  Pertinent  Medical History:  has Panic disorder; TOBACCO ABUSE; CARPAL TUNNEL SYNDROME, BILATERAL; ASTHMA UNSPECIFIED WITH EXACERBATION; COPD type B (HCC); GERD; DEGENERATIVE JOINT DISEASE, GENERALIZED; CERVICALGIA;  ABDOMIAL BRUIT; HELICOBACTER PYLORI GASTRITIS, HX OF; Low back pain; COPD with acute exacerbation (HCC); Dyslipidemia; Hyperglycemia; Chronic low back pain; Special screening for malignant neoplasms, colon; Maxillary sinusitis, acute; Migraine; Anxiety state; Depression, major, single episode, severe (HCC); Vocal cord leukoplakia; Syncope; Hypokalemia; Laryngopharyngeal reflux (LPR); Cervical spine fracture (HCC); S/P cervical spinal fusion; Trauma; Acute blood loss anemia; Generalized anxiety disorder; Neuropathic pain; Fracture of multiple ribs; Hypoxia; Cervical post-laminectomy syndrome; Adhesive capsulitis of left knee; Acute on chronic respiratory failure with hypoxia and hypercapnia (HCC); Lung mass; and COPD exacerbation (HCC) on their problem list.  Significant Hospital Events: Including procedures, antibiotic start and stop dates in addition to other pertinent events   4/28 admit. 4/29 PCCM consult.  Interim History / Subjective:  Comfortable on 2L O2. She wore BiPAP overnight and did not like the feel of it, felt that it was a lot of pressure on her face.  After explaining her recent admissions and her blood gases, she is willing to try this for the next few nights and hopefully become more comfortable with it.   Objective:  Blood pressure (!) 105/55, pulse 87, temperature 97.8 F (36.6 C), temperature source Oral, resp. rate 16, height 5\' 6"  (1.676 m), weight 60.3 kg, SpO2 100%.    FiO2 (%):  [32 %] 32 %   Intake/Output Summary (Last 24 hours) at 02/06/2024 1025 Last data filed at 02/06/2024 0930 Gross per 24 hour  Intake 600 ml  Output 300 ml  Net 300 ml   Filed Weights   02/05/24 1838  Weight: 60.3  kg    Examination: General: Adult female, resting in bed, in NAD. Neuro: A&O x 3, no deficits. HEENT: [place]/AT. Sclerae anicteric. EOMI. Cardiovascular: RRR, no M/R/G.  Lungs: Respirations even and unlabored.  CTA bilaterally, No W/R/R. Abdomen: BS x 4, soft, NT/ND.   Musculoskeletal: No gross deformities, no edema.  Skin: Intact, warm, no rashes.  Labs/imaging personally reviewed:  CXR 4/29 > known RUL nodule, small effusions  Assessment & Plan:   AECOPD - unclear etiology at this point. COVID/Flu/RSV negative. Tobacco dependence. - Continue empiric antibiotics. - Continue steroids, decreased from 60mg  to 40mg  daily. - Continue bronchodilators. - Routine bronchial hygiene. - Continue nicotine  patch. - Continue smoking cessation (patient states that she has decided to quit which I commended her for). - Close follow up as outpatient (has an appointment 02/12/25).  Acute on chronic hypoxic and hypercapnic respiratory failure  - 2/2 above. - Continue nocturnal BiPAP. - Patient will benefit from home BiPAP or NIV given her recent admissions with recurrent hypercapnia.  BiPAP/NIV will help prevent further readmissions as well as complications related to this including but not limited to progressive respiratory failure requiring mechanical ventilation or death.  Recently diagnosed squamous cell carcinoma (biopsy 01/23/24 by Dr. Baldwin Levee). - F/u with radiation oncology as an outpatient.  Rest per primary team. Will follow.  Best practice (evaluated daily):  Per primary.  Labs   CBC: Recent Labs  Lab 02/05/24 1448 02/06/24 0255  WBC 32.5* 24.5*  NEUTROABS 26.2*  --   HGB 12.7 11.4*  HCT 41.4 38.0  MCV 100.5* 100.8*  PLT 257 222    Basic Metabolic Panel: Recent Labs  Lab 02/05/24 1448 02/06/24 0255  NA 134* 135  K 3.9 4.5  CL 93* 96*  CO2 31 32  GLUCOSE 129* 144*  BUN 14 16  CREATININE 0.70 0.64  CALCIUM  8.8* 8.7*  MG 2.9*  --    GFR: Estimated Creatinine Clearance: 66.5 mL/min (by C-G formula based on SCr of 0.64 mg/dL). Recent Labs  Lab 02/05/24 1448 02/06/24 0255  WBC 32.5* 24.5*    Liver Function Tests: Recent Labs  Lab 02/05/24 1448  AST 12*  ALT 13  ALKPHOS 83  BILITOT 0.8  PROT 7.3  ALBUMIN 3.3*   No  results for input(s): "LIPASE", "AMYLASE" in the last 168 hours. No results for input(s): "AMMONIA" in the last 168 hours.  ABG    Component Value Date/Time   PHART 7.36 02/05/2024 1816   PCO2ART 59 (H) 02/05/2024 1816   PO2ART 78 (L) 02/05/2024 1816   HCO3 33.3 (H) 02/05/2024 1816   TCO2 26 11/15/2021 2306   O2SAT 97.5 02/05/2024 1816     Coagulation Profile: Recent Labs  Lab 02/06/24 0255  INR 1.2    Cardiac Enzymes: No results for input(s): "CKTOTAL", "CKMB", "CKMBINDEX", "TROPONINI" in the last 168 hours.  HbA1C: Hemoglobin A1C  Date/Time Value Ref Range Status  01/01/2024 04:05 PM 6.1 (A) 4.0 - 5.6 % Final   Hgb A1c MFr Bld  Date/Time Value Ref Range Status  04/07/2017 10:11 AM 6.0 4.6 - 6.5 % Final    Comment:    Glycemic Control Guidelines for People with Diabetes:Non Diabetic:  <6%Goal of Therapy: <7%Additional Action Suggested:  >8%   05/16/2016 09:37 AM 5.8 4.6 - 6.5 % Final    Comment:    Glycemic Control Guidelines for People with Diabetes:Non Diabetic:  <6%Goal of Therapy: <7%Additional Action Suggested:  >8%     CBG: No results for input(s): "GLUCAP"  in the last 168 hours.  Review of Systems:   All negative; except for those that are bolded, which indicate positives.  Constitutional: weight loss, weight gain, night sweats, fevers, chills, fatigue, weakness.  HEENT: headaches, sore throat, sneezing, nasal congestion, post nasal drip, difficulty swallowing, tooth/dental problems, visual complaints, visual changes, ear aches. Neuro: difficulty with speech, weakness, numbness, ataxia. CV:  chest pain, orthopnea, PND, swelling in lower extremities, dizziness, palpitations, syncope.  Resp: cough, hemoptysis, dyspnea, wheezing. GI: heartburn, indigestion, abdominal pain, nausea, vomiting, diarrhea, constipation, change in bowel habits, loss of appetite, hematemesis, melena, hematochezia.  GU: dysuria, change in color of urine, urgency or frequency, flank  pain, hematuria. MSK: joint pain or swelling, decreased range of motion. Psych: change in mood or affect, depression, anxiety, suicidal ideations, homicidal ideations. Skin: rash, itching, bruising.   Past Medical History:  She,  has a past medical history of Arthritis, ASTHMA UNSPECIFIED WITH EXACERBATION (07/21/2010), CARPAL TUNNEL SYNDROME, BILATERAL (01/16/2008), COPD (05/08/2008), Dyspnea, GERD (04/18/2007), HELICOBACTER PYLORI GASTRITIS, HX OF (04/18/2007), PANIC DISORDER (04/18/2007), Pneumonia, and TOBACCO ABUSE (07/17/2009).   Surgical History:   Past Surgical History:  Procedure Laterality Date   ANTERIOR CERVICAL DECOMP/DISCECTOMY FUSION N/A 11/19/2021   Procedure: CERVICAL FOUR-FIVE, CERVICAL FIVE-SIX, CERVICAL SIX-SEVEN ANTERIOR CERVICAL DECOMPRESSION/DISCECTOMY FUSION;  Surgeon: Isadora Mar, MD;  Location: [facility] OR;  Service: Neurosurgery;  Laterality: N/A;   APPENDECTOMY     BRONCHIAL BIOPSY  01/23/2024   Procedure: BRONCHOSCOPY, WITH BIOPSY;  Surgeon: Denson Flake, MD;  Location: [facility] ENDOSCOPY;  Service: Pulmonary;;   BRONCHIAL NEEDLE ASPIRATION BIOPSY  01/23/2024   Procedure: BRONCHOSCOPY, WITH NEEDLE ASPIRATION BIOPSY;  Surgeon: Denson Flake, MD;  Location: [facility] ENDOSCOPY;  Service: Pulmonary;;   OOPHORECTOMY     POSTERIOR CERVICAL FUSION/FORAMINOTOMY N/A 03/25/2022   Procedure: Cervical Three-Cervical Seven Posterior Cervical Fusion with Lateral Mass Fixation;  Surgeon: Isadora Mar, MD;  Location: [facility] OR;  Service: Neurosurgery;  Laterality: N/A;     Social History:   reports that she has been smoking cigarettes. She started smoking about 50 years ago. She has a 74.5 pack-year smoking history. She has never used smokeless tobacco. She reports current alcohol use. She reports that she does not use drugs.   Family History:  Her family history includes Breast cancer in her paternal aunt; Cancer in her father; Clotting disorder in her brother; Diabetes in her mother;  Heart disease in her father; Stomach cancer in her paternal uncle. There is no history of Esophageal cancer or Colon cancer.   Allergies Allergies  Allergen Reactions   Aspirin Other (See Comments)    REACTION: had h. pylori due to too many goody powders. told not to take any more asa     Home Medications  Prior to Admission medications   Medication Sig Start Date End Date Taking? Authorizing Provider  albuterol  (VENTOLIN  HFA) 108 (90 Base) MCG/ACT inhaler INHALE 2 PUFFS INTO THE LUNGS EVERY 4 HOURS AS NEEDED FOR WHEEZING OR SHORTNESS OF BREATH. 12/14/23   Burchette, Marijean Shouts, MD  ALPRAZolam  (XANAX ) 1 MG tablet Take 1 mg by mouth 3 (three) times daily. 12/18/23   [provider]  cyclobenzaprine  (FLEXERIL ) 10 MG tablet Take 10 mg by mouth.    [provider]  fluticasone  furoate-vilanterol (BREO ELLIPTA ) 200-25 MCG/ACT AEPB Inhale 1 puff into the lungs daily. Patient not taking: Reported on 01/30/2024 12/28/23   [name], DO  Fluticasone -Umeclidin-Vilant (TRELEGY ELLIPTA ) 100-62.5-25 MCG/ACT AEPB Inhale 1 Inhalation into the lungs daily. 01/17/24  [name], NP  Fluticasone -Umeclidin-Vilant (TRELEGY ELLIPTA ) 100-62.5-25 MCG/ACT AEPB Inhale 1 Inhalation into the lungs daily. 01/17/24 01/11/25  [name], NP  Fluticasone -Umeclidin-Vilant (TRELEGY ELLIPTA ) 100-62.5-25 MCG/ACT AEPB Inhale 1 puff into the lungs daily at 2 PM. 01/17/24   [name], NP  gabapentin  (NEURONTIN ) 100 MG capsule Take 1 capsule (100 mg total) by mouth 2 (two) times daily. Patient not taking: Reported on 01/30/2024 01/23/24   Byrum, Robert S, MD  ipratropium-albuterol  (DUONEB) 0.5-2.5 (3) MG/3ML SOLN Take 3 mLs by nebulization every 6 (six) hours as needed. 06/13/23   Elanor Cale, [name], MD  nicotine  (NICODERM CQ  - DOSED IN MG/24 HOURS) 21 mg/24hr patch Place 1 patch (21 mg total) onto the skin daily. 12/29/23   [name], DO  Oxycodone  HCl 10 MG TABS SMARTSIG:1 Tablet(s) By Mouth Every 12 Hours     [provider]  predniSONE  (DELTASONE ) 10 MG tablet Take 4 tabs for 3 days, then 3 tabs for 3 days, then 2 tabs for 3 days, then 1 tab for 3 days, then 1/2 tab for 4 days. 12/28/23   [name], DO     Rafael Bun, PA - [facility] For pager details, please see AMION or use Epic chat  After 1900, please call [facility] for cross coverage needs 02/06/2024, 10:25 AM

## 2024-02-06 NOTE — Assessment & Plan Note (Signed)
 Initially requiring BiPAP, currently on 4 L of oxygen with baseline 2 L use.  Patient with new diagnosis of lung cancer and advanced COPD.  ABG with concern of hypercapnia -NIV ordered

## 2024-02-06 NOTE — Assessment & Plan Note (Signed)
 Also concern of multifocal pneumonia, history of squamous cell carcinoma and elevated procalcitonin and leukocytosis. - Continue ceftriaxone  and Zithromax 

## 2024-02-06 NOTE — Assessment & Plan Note (Signed)
 Recent diagnosis of squamous cell lung cancer involving right upper and part of middle lobe.  S/p PET scan and biopsy Not a candidate for any surgery or systemic chemotherapy due to poor underlying lung function and functional status -Radiation therapy was recommended-has not started yet -Outpatient follow-up

## 2024-02-06 NOTE — Telephone Encounter (Signed)
 4/29 @ 1:58 pm Received call from patient's daughter.  Patient is now inpatient at [facility].  Secure chat sent to Allana Ishikawa and copied Latoya and Luda, so they are aware.

## 2024-02-06 NOTE — Hospital Course (Addendum)
 Taken from H&P.  [name] is a 63 y.o. female with medical history significant of COPD on home oxygen at night and during the day she was using as needed, came with worsening shortness of breath for 2 days.  No fever or chills.  No orthopnea or chest pain.  EMS was called and patient was found to be hypoxic to 80% on her usual 2 L of oxygen.  Patient was placed on 6 L of oxygen  On presentation to ED found to have significant CO2 retention on VBG, she was placed on BiPAP.  Labs pertinent for leukocytosis at 32.5, macrocytosis, BNP 186, respiratory panel negative.  CT XR with known right mid to upper lung opacity along with new parenchymal opacities versus pleural effusion. Admitted for COPD exacerbation.  Patient has a recent diagnosis of squamous cell lung cancer, biopsy was done on 01/23/2024.  Due to poor underlying functional status surgical/chemo treatment has been deferred and patient is going to get radiation therapy-has not been started yet.  4/29: Vitals currently stable on 3 L of oxygen.  ABG done last night with CO2 of 59, O2 78, a.m. labs with improving leukocytosis at 24.5.  Procalcitonin at 0.73.  Folate and B12 was normal which was checked due to macrocytosis. NIV was ordered, pulmonary would like to give her couple of days and use BiPAP at night while in the hospital to see the tolerability. Patient has upcoming radiation oncology appointment on Thursday.  The patient's condition quickly deteriorates without a ventilator. Removal of the ventilator may cause serious harm to the patient, exacerbation of condition and hospital readmission. Bilevel/RAD has been tried and failed to maintain or stabilize the patient. Bilevel cannot meet current volume requirements. Patient requires frequent durations of ventilatory support. Intermittent usage is insufficient for the patient.

## 2024-02-06 NOTE — Progress Notes (Signed)
 Progress Note   Patient: [name]:[id number] DOB: 06-25-1961 DOA: 02/05/2024     1 DOS: the patient was seen and examined on 02/06/2024   Brief hospital course: Taken from H&P.  DOMINGA LAMMONS is a 63 y.o. female with medical history significant of COPD on home oxygen at night and during the day she was using as needed, came with worsening shortness of breath for 2 days.  No fever or chills.  No orthopnea or chest pain.  EMS was called and patient was found to be hypoxic to 80% on her usual 2 L of oxygen.  Patient was placed on 6 L of oxygen  On presentation to ED found to have significant CO2 retention on VBG, she was placed on BiPAP.  Labs pertinent for leukocytosis at 32.5, macrocytosis, BNP 186, respiratory panel negative.  CT XR with known right mid to upper lung opacity along with new parenchymal opacities versus pleural effusion. Admitted for COPD exacerbation.  Patient has a recent diagnosis of squamous cell lung cancer, biopsy was done on 01/23/2024.  Due to poor underlying functional status surgical/chemo treatment has been deferred and patient is going to get radiation therapy-has not been started yet.  4/29: Vitals currently stable on 3 L of oxygen.  ABG done last night with CO2 of 59, O2 78, a.m. labs with improving leukocytosis at 24.5.  Procalcitonin at 0.73.  Folate and B12 was normal which was checked due to macrocytosis. NIV was ordered, pulmonary would like to give her couple of days and use BiPAP at night while in the hospital to see the tolerability. Patient has upcoming radiation oncology appointment on Thursday.  The patient's condition quickly deteriorates without a ventilator. Removal of the ventilator may cause serious harm to the patient, exacerbation of condition and hospital readmission. Bilevel/RAD has been tried and failed to maintain or stabilize the patient. Bilevel cannot meet current volume requirements. Patient requires frequent durations of  ventilatory support. Intermittent usage is insufficient for the patient.   Assessment and Plan: * COPD exacerbation (HCC) Baseline is 1.5 - 2lpm oxygen with active smoking, trying to quit. Review of ABG/VBG suggest some chronic hypercapnic resp failure with now acute hypercapnic resp failure s.p bipap with resolution down to her chronic hypercapniea. Although still having acute on chronic hypoxic , requiring 4 lpm oxygen. S.p duoneb, salumedorl and magnesium  in EMS.   -Continue with methylprednisolone  60 mg daily with GI prophylaxis.   -DuoNeb every 4 hourly.   -Acting beta adrenergic therapy.   -Inhaled corticosteroids ordered.  - NIV was also ordered  Multifocal pneumonia Also concern of multifocal pneumonia, history of squamous cell carcinoma and elevated procalcitonin and leukocytosis. - Continue ceftriaxone  and Zithromax   Acute on chronic respiratory failure with hypoxia and hypercapnia (HCC) Initially requiring BiPAP, currently on 4 L of oxygen with baseline 2 L use.  Patient with new diagnosis of lung cancer and advanced COPD.  ABG with concern of hypercapnia -NIV ordered  Squamous cell lung cancer, right (HCC) Recent diagnosis of squamous cell lung cancer involving right upper and part of middle lobe.  S/p PET scan and biopsy Not a candidate for any surgery or systemic chemotherapy due to poor underlying lung function and functional status -Radiation therapy was recommended-has not started yet -Outpatient follow-up  Tobacco dependence Patient is trying to quit, she has good insight into the benefits of quitting smoking including potential reduction in repeated hospitalizations from COPD exacerbation.   - Counseling was again provided. -Nicotine  patch as  needed  Lung mass    Cervical spine fracture (HCC) Is chronic, continue with oxycodone  10 mg every 12 hours as needed for pain.  With acetaminophen  available in between.  Continue with Flexeril  as needed for spasm.  Patient  reports not taking gabapentin .  Anxiety Is chronic, patient takes Xanax  at home, 1 mg 3 times a day.  Corroborated by PDMP      Subjective: Patient was still having cough, shortness of breath seems improving.  Per patient she is trying to quit smoking. Patient was little concerned that how she will tolerate BiPAP, she is willing to try while in the hospital.  Physical Exam: Vitals:   02/06/24 1000 02/06/24 1100 02/06/24 1151 02/06/24 1200  BP: 103/66 (!) 93/53  (!) 119/57  Pulse: 89 (!) 101  (!) 107  Resp: 18 12  (!) 21  Temp:    98.2 F (36.8 C)  TempSrc:    Oral  SpO2: 98% 95% 95% 96%  Weight:      Height:       General.  Malnourished lady, in no acute distress. Pulmonary.  Bronchial breath sounds at right upper lung, normal respiratory effort. CV.  Regular rate and rhythm, no JVD, rub or murmur. Abdomen.  Soft, nontender, nondistended, BS positive. CNS.  Alert and oriented .  No focal neurologic deficit. Extremities.  No edema, no cyanosis, pulses intact and symmetrical. Psychiatry.  Judgment and insight appears normal.   Data Reviewed: Prior data reviewed  Family Communication: Discussed with patient  Disposition: Status is: Inpatient Remains inpatient appropriate because: Severity of illness  Planned Discharge Destination: Home  DVT prophylaxis.  Lovenox  Time spent: 50 minutes  This record has been created using Conservation officer, historic buildings. Errors have been sought and corrected,but may not always be located. Such creation errors do not reflect on the standard of care.   Author: Luna Salinas, MD 02/06/2024 2:17 PM  For on call review [email or web address].

## 2024-02-07 ENCOUNTER — Encounter (HOSPITAL_COMMUNITY): Payer: Self-pay | Admitting: Internal Medicine

## 2024-02-07 DIAGNOSIS — J441 Chronic obstructive pulmonary disease with (acute) exacerbation: Secondary | ICD-10-CM | POA: Diagnosis not present

## 2024-02-07 MED ORDER — GUAIFENESIN 100 MG/5ML PO LIQD
5.0000 mL | ORAL | Status: DC | PRN
  Administered 2024-02-07 – 2024-02-09 (×7): 5 mL via ORAL
  Filled 2024-02-07 (×7): qty 10

## 2024-02-07 MED ORDER — IPRATROPIUM-ALBUTEROL 0.5-2.5 (3) MG/3ML IN SOLN
3.0000 mL | Freq: Four times a day (QID) | RESPIRATORY_TRACT | Status: DC
  Administered 2024-02-08: 3 mL via RESPIRATORY_TRACT
  Filled 2024-02-07: qty 3

## 2024-02-07 NOTE — Progress Notes (Signed)
 NAME:  [name], MRN:  [id number], DOB:  25-Jun-1961, LOS: 2 ADMISSION DATE:  02/05/2024, CONSULTATION DATE:  02/06/24  REFERRING MD:  Ariel Begun CHIEF COMPLAINT:  Dyspnea   History of Present Illness:  [name] is a 63 y.o. female who has a PMH as outlined below including but not limited to COPD (PFTs from 2019 with FEV1 1.28/47% predicted, ratio 57/73% predicted), recent diagnosis squamous cell carcinoma after biopsy on 01/23/2024 (Dr. Baldwin Levee), ongoing tobacco dependence, chronic hypoxic and hypercapnic respiratory failure on 2 L of oxygen.  She had been referred to radiation oncology but has not yet seen them.  Due to her pulmonary limitations based off PFTs from 6 years ago she, she was deemed to not be a appropriate surgical candidate and she also was not interested in surgery.  She is at 2 admissions in the past 6 months due to COPD exacerbation.  On both admissions, her blood gases have demonstrated hypercapnia with CO2 in the 80 range (pH 7.3 and 7.21).  She was brought to [facility] ED on 4/28 with dyspnea, hypoxia, COPD exacerbation.  ABG in the ED demonstrated acute on chronic hypoxic hypercapnic respiratory failure (7.36/59/78).  She was admitted by the hospitalist service and on 4/29, PCCM was asked to see in consultation for consideration of BiPAP/NIV due to recurrent admissions for acute on chronic hypoxic and hypercapnic respiratory failure.  She did wear BiPAP overnight on 4/28 and did not like the feel of it.  She is not unsure whether she will be able to tolerate this long-term however, she was willing to try this while inpatient.  Tells me that she has decided that she will quit smoking.  Has had ongoing cough, no change in sputum production.  No recent fever/chills/sweats  Pertinent  Medical History:  has Panic disorder; Tobacco dependence; CARPAL TUNNEL SYNDROME, BILATERAL; ASTHMA UNSPECIFIED WITH EXACERBATION; COPD type B (HCC); GERD; DEGENERATIVE JOINT DISEASE, GENERALIZED;  CERVICALGIA; ABDOMIAL BRUIT; HELICOBACTER PYLORI GASTRITIS, HX OF; Low back pain; COPD with acute exacerbation (HCC); Dyslipidemia; Hyperglycemia; Chronic low back pain; Special screening for malignant neoplasms, colon; Maxillary sinusitis, acute; Migraine; Anxiety; Depression, major, single episode, severe (HCC); Vocal cord leukoplakia; Syncope; Hypokalemia; Laryngopharyngeal reflux (LPR); Cervical spine fracture (HCC); S/P cervical spinal fusion; Trauma; Acute blood loss anemia; Generalized anxiety disorder; Neuropathic pain; Fracture of multiple ribs; Hypoxia; Cervical post-laminectomy syndrome; Adhesive capsulitis of left knee; Acute on chronic respiratory failure with hypoxia and hypercapnia (HCC); Lung mass; COPD exacerbation (HCC); Multifocal pneumonia; and Squamous cell lung cancer, right (HCC) on their problem list.  Significant Hospital Events: Including procedures, antibiotic start and stop dates in addition to other pertinent events   4/28 admit. 4/29 PCCM consult.  Interim History / Subjective:  Tolerated BiPAP overnight for about 2 hours. Willing to try and double to 4 hours tonight.   Objective:  Blood pressure 111/72, pulse 88, temperature 97.7 F (36.5 C), temperature source Oral, resp. rate 18, height 5\' 6"  (1.676 m), weight 60.3 kg, SpO2 98%.    FiO2 (%):  [40 %] 40 % PEEP:  [6 cmH20] 6 cmH20   Intake/Output Summary (Last 24 hours) at 02/07/2024 1028 Last data filed at 02/07/2024 0815 Gross per 24 hour  Intake 584.65 ml  Output --  Net 584.65 ml   Filed Weights   02/05/24 1838  Weight: 60.3 kg    Examination: General: Adult female, resting in bed, in NAD. Neuro: A&O x 3, no deficits. HEENT: [place]/AT. Sclerae anicteric. EOMI. Cardiovascular: RRR, no M/R/G.  Lungs: Respirations even and unlabored.  CTA bilaterally, No W/R/R. Abdomen: BS x 4, soft, NT/ND.  Musculoskeletal: No gross deformities, no edema.  Skin: Intact, warm, no rashes.  Labs/imaging personally  reviewed:  CXR 4/29 > known RUL nodule, small effusions  Assessment & Plan:   AECOPD - unclear etiology at this point. COVID/Flu/RSV negative. Tobacco dependence. - Continue empiric CTX. - Continue steroids. - Continue bronchodilators. - Routine bronchial hygiene. - Continue nicotine  patch. - Continue smoking cessation (patient states that she has decided to quit which I commended her for). - Close follow up as outpatient (has an appointment 02/12/25).  Acute on chronic hypoxic and hypercapnic respiratory failure  - 2/2 above. - Continue nocturnal BiPAP. - Patient will benefit from home NIV given her recent admissions with recurrent hypercapnia.  NIV will help prevent further readmissions as well as complications related to this including but not limited to progressive respiratory failure requiring mechanical ventilation or death. She should prove that she can tolerate it for ideally two nights prior to discharge, tonight she is willing to try and increase from 2 to 4 hours.  Recently diagnosed squamous cell carcinoma (biopsy 01/23/24 by Dr. Baldwin Levee). - F/u with radiation oncology as an outpatient.  Rest per primary team. Will follow.  Best practice (evaluated daily):  Per primary.   Rafael Bun, PA - [facility] For pager details, please see AMION or use Epic chat  After 1900, please call [facility] for cross coverage needs 02/07/2024, 10:28 AM

## 2024-02-07 NOTE — Progress Notes (Signed)
 PROGRESS NOTE    [name]  ZOX:[id number] DOB: 26-Jun-1961 DOA: 02/05/2024 PCP: [name], MD    Brief Narrative:    [name] is a 63 y.o. female with medical history significant of COPD on home oxygen at night and during the day she was using as needed, came with worsening shortness of breath for 2 days.  No fever or chills.  Found to be in a COPD exacerbation.  Pulmonology consulted and plan is to discharge on BiPAP/NIV once we know she is tolerating   Assessment and Plan: * COPD exacerbation (HCC) Baseline is 1.5 - 2lpm oxygen with active smoking but plans to quit -Continue with methylprednisolone  60 mg daily with GI prophylaxis.   - Nebs - NIV was also ordered  Multifocal pneumonia -Finished treatment with antibiotics  Acute on chronic respiratory failure with hypoxia and hypercapnia (HCC) Initially requiring BiPAP, currently on 4 L of oxygen with baseline 2 L use.  Patient with new diagnosis of lung cancer and advanced COPD.  ABG with concern of hypercapnia -NIV ordered for home -Pulmonary would like to know that she is tolerating in the hospital prior to discharge  Squamous cell lung cancer, right [facility]) Recent diagnosis of squamous cell lung cancer involving right upper and part of middle lobe.  S/p PET scan and biopsy Not a candidate for any surgery or systemic chemotherapy due to poor underlying lung function and functional status -Radiation therapy was recommended-has not started yet -Outpatient follow-up  Tobacco dependence -Encouraged cessation   Cervical spine fracture (HCC) -chronic, continue with oxycodone  10 mg every 12 hours as needed for pain.  With acetaminophen  available in between.  Continue with Flexeril  as needed for spasm.  Patient reports not taking gabapentin .  Anxiety -chronic, patient takes Xanax  at home, 1 mg 3 times a day.  Corroborated by PDMP     DVT prophylaxis: enoxaparin  (LOVENOX ) injection 40 mg Start: 02/05/24  2200 SCDs Start: 02/05/24 1707    Code Status: Full Code   Disposition Plan:  Level of care: Progressive Status is: Inpatient     Consultants:  Pulmonary  Subjective: Was able to wear the BiPAP 2 hours last night  Objective: Vitals:   02/07/24 0405 02/07/24 0538 02/07/24 0745 02/07/24 1139  BP:  111/72    Pulse:  88    Resp:      Temp:  97.7 F (36.5 C)    TempSrc:  Oral    SpO2: 96% 98% 98% 98%  Weight:      Height:        Intake/Output Summary (Last 24 hours) at 02/07/2024 1141 Last data filed at 02/07/2024 0815 Gross per 24 hour  Intake 584.65 ml  Output --  Net 584.65 ml   Filed Weights   02/05/24 1838  Weight: 60.3 kg    Examination:   General: Appearance:    Well developed, well nourished female in no acute distress     Lungs:   Diminished, respirations unlabored  Heart:    Normal heart rate. Normal rhythm. No murmurs, rubs, or gallops.    MS:   All extremities are intact.    Neurologic:   Awake, alert, oriented x 3. No apparent focal neurological           defect.        Data Reviewed: I have personally reviewed following labs and imaging studies  CBC: Recent Labs  Lab 02/05/24 1448 02/06/24 0255  WBC 32.5* 24.5*  NEUTROABS 26.2*  --  HGB 12.7 11.4*  HCT 41.4 38.0  MCV 100.5* 100.8*  PLT 257 222   Basic Metabolic Panel: Recent Labs  Lab 02/05/24 1448 02/06/24 0255  NA 134* 135  K 3.9 4.5  CL 93* 96*  CO2 31 32  GLUCOSE 129* 144*  BUN 14 16  CREATININE 0.70 0.64  CALCIUM  8.8* 8.7*  MG 2.9*  --    GFR: Estimated Creatinine Clearance: 66.5 mL/min (by C-G formula based on SCr of 0.64 mg/dL). Liver Function Tests: Recent Labs  Lab 02/05/24 1448  AST 12*  ALT 13  ALKPHOS 83  BILITOT 0.8  PROT 7.3  ALBUMIN 3.3*   No results for input(s): "LIPASE", "AMYLASE" in the last 168 hours. No results for input(s): "AMMONIA" in the last 168 hours. Coagulation Profile: Recent Labs  Lab 02/06/24 0255  INR 1.2   Cardiac  Enzymes: No results for input(s): "CKTOTAL", "CKMB", "CKMBINDEX", "TROPONINI" in the last 168 hours. BNP (last 3 results) No results for input(s): "PROBNP" in the last 8760 hours. HbA1C: No results for input(s): "HGBA1C" in the last 72 hours. CBG: No results for input(s): "GLUCAP" in the last 168 hours. Lipid Profile: No results for input(s): "CHOL", "HDL", "LDLCALC", "TRIG", "CHOLHDL", "LDLDIRECT" in the last 72 hours. Thyroid Function Tests: No results for input(s): "TSH", "T4TOTAL", "FREET4", "T3FREE", "THYROIDAB" in the last 72 hours. Anemia Panel: Recent Labs    02/06/24 0859  VITAMINB12 654  FOLATE 10.0   Sepsis Labs: Recent Labs  Lab 02/06/24 0859  PROCALCITON 0.73    Recent Results (from the past 240 hours)  Resp panel by RT-PCR (RSV, Flu A&B, Covid) Anterior Nasal Swab     Status: None   Collection Time: 02/05/24  2:48 PM   Specimen: Anterior Nasal Swab  Result Value Ref Range Status   SARS Coronavirus 2 by RT PCR NEGATIVE NEGATIVE Final    Comment: (NOTE) SARS-CoV-2 target nucleic acids are NOT DETECTED.  The SARS-CoV-2 RNA is generally detectable in upper respiratory specimens during the acute phase of infection. The lowest concentration of SARS-CoV-2 viral copies this assay can detect is 138 copies/mL. A negative result does not preclude SARS-Cov-2 infection and should not be used as the sole basis for treatment or other patient management decisions. A negative result may occur with  improper specimen collection/handling, submission of specimen other than nasopharyngeal swab, presence of viral mutation(s) within the areas targeted by this assay, and inadequate number of viral copies(<138 copies/mL). A negative result must be combined with clinical observations, patient history, and epidemiological information. The expected result is Negative.  Fact Sheet for Patients:  BloggerCourse.com  Fact Sheet for Healthcare Providers:   SeriousBroker.it  This test is no t yet approved or cleared by the [place]  FDA and  has been authorized for detection and/or diagnosis of SARS-CoV-2 by FDA under an Emergency Use Authorization (EUA). This EUA will remain  in effect (meaning this test can be used) for the duration of the COVID-19 declaration under Section 564(b)(1) of the Act, 21 U.S.C.section 360bbb-3(b)(1), unless the authorization is terminated  or revoked sooner.       Influenza A by PCR NEGATIVE NEGATIVE Final   Influenza B by PCR NEGATIVE NEGATIVE Final    Comment: (NOTE) The Xpert Xpress SARS-CoV-2/FLU/RSV plus assay is intended as an aid in the diagnosis of influenza from Nasopharyngeal swab specimens and should not be used as a sole basis for treatment. Nasal washings and aspirates are unacceptable for Xpert Xpress SARS-CoV-2/FLU/RSV testing.  Fact Sheet  for Patients: BloggerCourse.com  Fact Sheet for Healthcare Providers: SeriousBroker.it  This test is not yet approved or cleared by the [place]  FDA and has been authorized for detection and/or diagnosis of SARS-CoV-2 by FDA under an Emergency Use Authorization (EUA). This EUA will remain in effect (meaning this test can be used) for the duration of the COVID-19 declaration under Section 564(b)(1) of the Act, 21 U.S.C. section 360bbb-3(b)(1), unless the authorization is terminated or revoked.     Resp Syncytial Virus by PCR NEGATIVE NEGATIVE Final    Comment: (NOTE) Fact Sheet for Patients: BloggerCourse.com  Fact Sheet for Healthcare Providers: SeriousBroker.it  This test is not yet approved or cleared by the [place]  FDA and has been authorized for detection and/or diagnosis of SARS-CoV-2 by FDA under an Emergency Use Authorization (EUA). This EUA will remain in effect (meaning this test can be used) for  the duration of the COVID-19 declaration under Section 564(b)(1) of the Act, 21 U.S.C. section 360bbb-3(b)(1), unless the authorization is terminated or revoked.  Performed at [facility], [address], [place], [place] [address]   MRSA Next Gen by PCR, Nasal     Status: None   Collection Time: 02/05/24  6:39 PM   Specimen: Nasal Mucosa; Nasal Swab  Result Value Ref Range Status   MRSA by PCR Next Gen NOT DETECTED NOT DETECTED Final    Comment: (NOTE) The GeneXpert MRSA Assay (FDA approved for NASAL specimens only), is one component of a comprehensive MRSA colonization surveillance program. It is not intended to diagnose MRSA infection nor to guide or monitor treatment for MRSA infections. Test performance is not FDA approved in patients less than 66 years old. Performed at [facility], [address] [place], [place] [address]          Radiology Studies: [facility] Chest Port 1 View Result Date: 02/05/2024 CLINICAL DATA:  Dyspnea. EXAM: PORTABLE CHEST 1 VIEW COMPARISON:  01/23/2024. FINDINGS: Redemonstration of ill-defined faint opacity overlying the right mid lung zone which corresponds to patient's known right lung upper lobe mass. There are additional nonspecific heterogeneous opacities throughout bilateral lungs with lower lobe predominance, which are also similar to the prior study. There is new blunting of bilateral lateral costophrenic angles, which may be due to overlying lung parenchymal opacities versus pleural effusions. No pneumothorax on either side. Stable cardio-mediastinal silhouette. No acute osseous abnormalities. Old healed bilateral posterior rib fractures noted. The soft tissues are within normal limits. IMPRESSION: *Redemonstration of ill-defined opacity overlying the right mid lung zone, corresponding to patient's known right lung upper lobe mass. *There is new blunting of bilateral lateral costophrenic angles, which may be due  to overlying lung parenchymal opacities versus pleural effusions. Electronically Signed   By: Beula Brunswick M.D.   On: 02/05/2024 16:32        Scheduled Meds:  Chlorhexidine  Gluconate Cloth  6 each Topical Daily   enoxaparin  (LOVENOX ) injection  40 mg Subcutaneous Q24H   fluticasone  furoate-vilanterol  1 puff Inhalation Daily   ipratropium-albuterol   3 mL Nebulization Q4H   methylPREDNISolone  (SOLU-MEDROL ) injection  40 mg Intravenous Daily   nicotine   21 mg Transdermal Daily   mouth rinse  15 mL Mouth Rinse 4 times per day   pantoprazole   40 mg Oral QHS   senna-docusate  2 tablet Oral BID   sodium chloride  flush  3 mL Intravenous Q12H   Continuous Infusions:  azithromycin  500 mg (02/07/24 1055)   cefTRIAXone  (ROCEPHIN )  IV  1 g (02/07/24 0834)     LOS: 2 days    Time spent: 45 minutes spent on chart review, discussion with nursing staff, consultants, updating family and interview/physical exam; more than 50% of that time was spent in counseling and/or coordination of care.    Enrigue Harvard, DO [facility] Available via Epic secure chat 7am-7pm After these hours, please refer to coverage provider listed on [email or web address] 02/07/2024, 11:41 AM

## 2024-02-08 ENCOUNTER — Ambulatory Visit

## 2024-02-08 ENCOUNTER — Ambulatory Visit: Admitting: Radiation Oncology

## 2024-02-08 DIAGNOSIS — J441 Chronic obstructive pulmonary disease with (acute) exacerbation: Secondary | ICD-10-CM

## 2024-02-08 MED ORDER — HYDROXYZINE HCL 10 MG PO TABS
10.0000 mg | ORAL_TABLET | Freq: Three times a day (TID) | ORAL | Status: DC | PRN
  Administered 2024-02-08: 10 mg via ORAL
  Filled 2024-02-08 (×2): qty 1

## 2024-02-08 MED ORDER — IPRATROPIUM-ALBUTEROL 0.5-2.5 (3) MG/3ML IN SOLN
3.0000 mL | Freq: Four times a day (QID) | RESPIRATORY_TRACT | Status: DC
  Administered 2024-02-08 – 2024-02-09 (×5): 3 mL via RESPIRATORY_TRACT
  Filled 2024-02-08 (×5): qty 3

## 2024-02-08 NOTE — TOC Progression Note (Signed)
 Transition of Care [facility]) - Progression Note    Patient Details  Name: [name] MRN: [id number] Date of Birth: [birth year]  [facility] CM/SW Contact  Bexton Haak, Thersia Flax, RN Phone Number: 02/08/2024, 3:11 PM  Clinical Narrative: Already active w/[employer] for home 02-family [employer] has travel tank to bring to hospital @ d/c. [employer] [facility] has already has received approval for NIV to deliver to rm tomorrow prior d/c. [facility] active for HHPT-[facility]. Has own transport home.        Barriers to Discharge: Continued Medical Work up  Expected Discharge Plan and Services   Discharge Planning Services: CM Consult Post Acute Care Choice: Durable Medical Equipment, Home Health Living arrangements for the past 2 months: Single Family Home                                       Social Determinants of Health (SDOH) Interventions SDOH Screenings   Food Insecurity: No Food Insecurity (02/06/2024)  Housing: Low Risk  (02/06/2024)  Transportation Needs: No Transportation Needs (02/06/2024)  Utilities: Not At Risk (02/06/2024)  Depression (PHQ2-9): Low Risk  (11/02/2022)  Tobacco Use: High Risk (02/07/2024)    Readmission Risk Interventions    02/06/2024   12:06 PM  Readmission Risk Prevention Plan  Transportation Screening Complete  PCP or Specialist Appt within 5-7 Days Complete  Home Care Screening Complete  Medication Review (RN CM) Complete

## 2024-02-08 NOTE — Progress Notes (Signed)
 PROGRESS NOTE    [name]  ZOX:[id number] DOB: 1960/12/08 DOA: 02/05/2024 PCP: [name], MD    Brief Narrative:    [name] is a 63 y.o. female with medical history significant of COPD on home oxygen at night and during the day she was using as needed, came with worsening shortness of breath for 2 days.  No fever or chills.  Found to be in a COPD exacerbation.  Pulmonology consulted and plan is to discharge on BiPAP/NIV once we know she is tolerating   Assessment and Plan: * COPD exacerbation (HCC) Baseline is 1.5 - 2lpm oxygen with active smoking but plans to quit -Continue with methylprednisolone  60 mg daily with GI prophylaxis.   - Nebs  Multifocal pneumonia -Finished treatment with antibiotics  Acute on chronic respiratory failure with hypoxia and hypercapnia (HCC) Initially requiring BiPAP, currently on 4 L of oxygen with baseline 2 L use.  Patient with new diagnosis of lung cancer and advanced COPD.  ABG with concern of hypercapnia -NIV ordered for home -Pulmonary would like to know that she is tolerating in the hospital prior to discharge  Squamous cell lung cancer, right [facility]) Recent diagnosis of squamous cell lung cancer involving right upper and part of middle lobe.  S/p PET scan and biopsy Not a candidate for any surgery or systemic chemotherapy due to poor underlying lung function and functional status -Radiation therapy was recommended-has not started yet -Outpatient follow-up  Tobacco dependence -Encouraged cessation   Cervical spine fracture (HCC) -chronic, continue with oxycodone  10 mg every 12 hours as needed for pain.  With acetaminophen  available in between.  Continue with Flexeril  as needed for spasm.  Patient reports not taking gabapentin .  Anxiety -chronic, patient takes Xanax  at home, 1 mg 3 times a day.  Corroborated by PDMP   PT eval for complaints of weakness  DVT prophylaxis: enoxaparin  (LOVENOX ) injection 40 mg Start:  02/05/24 2200 SCDs Start: 02/05/24 1707    Code Status: Full Code   Disposition Plan:  Level of care: Telemetry Status is: Inpatient     Consultants:  Pulmonary  Subjective: C/o weakness  Objective: Vitals:   02/08/24 0023 02/08/24 0549 02/08/24 0804 02/08/24 0900  BP:  116/74  (!) 108/58  Pulse:  82  94  Resp: 19 17    Temp:  97.6 F (36.4 C)  98.2 F (36.8 C)  TempSrc:  Oral  Oral  SpO2:  100% 100% 96%  Weight:      Height:       No intake or output data in the 24 hours ending 02/08/24 1225  Filed Weights   02/05/24 1838  Weight: 60.3 kg    Examination:   General: Appearance:    Well developed, well nourished female in no acute distress     Lungs:   Diminished, respirations unlabored  Heart:    Normal heart rate. Normal rhythm. No murmurs, rubs, or gallops.    MS:   All extremities are intact.    Neurologic:   Awake, alert, oriented x 3. No apparent focal neurological           defect.        Data Reviewed: I have personally reviewed following labs and imaging studies  CBC: Recent Labs  Lab 02/05/24 1448 02/06/24 0255  WBC 32.5* 24.5*  NEUTROABS 26.2*  --   HGB 12.7 11.4*  HCT 41.4 38.0  MCV 100.5* 100.8*  PLT 257 222   Basic Metabolic Panel:  Recent Labs  Lab 02/05/24 1448 02/06/24 0255  NA 134* 135  K 3.9 4.5  CL 93* 96*  CO2 31 32  GLUCOSE 129* 144*  BUN 14 16  CREATININE 0.70 0.64  CALCIUM  8.8* 8.7*  MG 2.9*  --    GFR: Estimated Creatinine Clearance: 66.5 mL/min (by C-G formula based on SCr of 0.64 mg/dL). Liver Function Tests: Recent Labs  Lab 02/05/24 1448  AST 12*  ALT 13  ALKPHOS 83  BILITOT 0.8  PROT 7.3  ALBUMIN 3.3*   No results for input(s): "LIPASE", "AMYLASE" in the last 168 hours. No results for input(s): "AMMONIA" in the last 168 hours. Coagulation Profile: Recent Labs  Lab 02/06/24 0255  INR 1.2   Cardiac Enzymes: No results for input(s): "CKTOTAL", "CKMB", "CKMBINDEX", "TROPONINI" in the last  168 hours. BNP (last 3 results) No results for input(s): "PROBNP" in the last 8760 hours. HbA1C: No results for input(s): "HGBA1C" in the last 72 hours. CBG: No results for input(s): "GLUCAP" in the last 168 hours. Lipid Profile: No results for input(s): "CHOL", "HDL", "LDLCALC", "TRIG", "CHOLHDL", "LDLDIRECT" in the last 72 hours. Thyroid Function Tests: No results for input(s): "TSH", "T4TOTAL", "FREET4", "T3FREE", "THYROIDAB" in the last 72 hours. Anemia Panel: Recent Labs    02/06/24 0859  VITAMINB12 654  FOLATE 10.0   Sepsis Labs: Recent Labs  Lab 02/06/24 0859  PROCALCITON 0.73    Recent Results (from the past 240 hours)  Resp panel by RT-PCR (RSV, Flu A&B, Covid) Anterior Nasal Swab     Status: None   Collection Time: 02/05/24  2:48 PM   Specimen: Anterior Nasal Swab  Result Value Ref Range Status   SARS Coronavirus 2 by RT PCR NEGATIVE NEGATIVE Final    Comment: (NOTE) SARS-CoV-2 target nucleic acids are NOT DETECTED.  The SARS-CoV-2 RNA is generally detectable in upper respiratory specimens during the acute phase of infection. The lowest concentration of SARS-CoV-2 viral copies this assay can detect is 138 copies/mL. A negative result does not preclude SARS-Cov-2 infection and should not be used as the sole basis for treatment or other patient management decisions. A negative result may occur with  improper specimen collection/handling, submission of specimen other than nasopharyngeal swab, presence of viral mutation(s) within the areas targeted by this assay, and inadequate number of viral copies(<138 copies/mL). A negative result must be combined with clinical observations, patient history, and epidemiological information. The expected result is Negative.  Fact Sheet for Patients:  BloggerCourse.com  Fact Sheet for Healthcare Providers:  SeriousBroker.it  This test is no t yet approved or cleared by the  [place]  FDA and  has been authorized for detection and/or diagnosis of SARS-CoV-2 by FDA under an Emergency Use Authorization (EUA). This EUA will remain  in effect (meaning this test can be used) for the duration of the COVID-19 declaration under Section 564(b)(1) of the Act, 21 U.S.C.section 360bbb-3(b)(1), unless the authorization is terminated  or revoked sooner.       Influenza A by PCR NEGATIVE NEGATIVE Final   Influenza B by PCR NEGATIVE NEGATIVE Final    Comment: (NOTE) The Xpert Xpress SARS-CoV-2/FLU/RSV plus assay is intended as an aid in the diagnosis of influenza from Nasopharyngeal swab specimens and should not be used as a sole basis for treatment. Nasal washings and aspirates are unacceptable for Xpert Xpress SARS-CoV-2/FLU/RSV testing.  Fact Sheet for Patients: BloggerCourse.com  Fact Sheet for Healthcare Providers: SeriousBroker.it  This test is not yet approved or cleared by  the [place]  FDA and has been authorized for detection and/or diagnosis of SARS-CoV-2 by FDA under an Emergency Use Authorization (EUA). This EUA will remain in effect (meaning this test can be used) for the duration of the COVID-19 declaration under Section 564(b)(1) of the Act, 21 U.S.C. section 360bbb-3(b)(1), unless the authorization is terminated or revoked.     Resp Syncytial Virus by PCR NEGATIVE NEGATIVE Final    Comment: (NOTE) Fact Sheet for Patients: BloggerCourse.com  Fact Sheet for Healthcare Providers: SeriousBroker.it  This test is not yet approved or cleared by the [place]  FDA and has been authorized for detection and/or diagnosis of SARS-CoV-2 by FDA under an Emergency Use Authorization (EUA). This EUA will remain in effect (meaning this test can be used) for the duration of the COVID-19 declaration under Section 564(b)(1) of the Act, 21 U.S.C. section  360bbb-3(b)(1), unless the authorization is terminated or revoked.  Performed at [facility], [address] [place], [place] [address]   MRSA Next Gen by PCR, Nasal     Status: None   Collection Time: 02/05/24  6:39 PM   Specimen: Nasal Mucosa; Nasal Swab  Result Value Ref Range Status   MRSA by PCR Next Gen NOT DETECTED NOT DETECTED Final    Comment: (NOTE) The GeneXpert MRSA Assay (FDA approved for NASAL specimens only), is one component of a comprehensive MRSA colonization surveillance program. It is not intended to diagnose MRSA infection nor to guide or monitor treatment for MRSA infections. Test performance is not FDA approved in patients less than 5 years old. Performed at [facility], [address] [place], [place] [address]          Radiology Studies: No results found.       Scheduled Meds:  Chlorhexidine  Gluconate Cloth  6 each Topical Daily   enoxaparin  (LOVENOX ) injection  40 mg Subcutaneous Q24H   fluticasone  furoate-vilanterol  1 puff Inhalation Daily   ipratropium-albuterol   3 mL Nebulization Q6H   methylPREDNISolone  (SOLU-MEDROL ) injection  40 mg Intravenous Daily   nicotine   21 mg Transdermal Daily   mouth rinse  15 mL Mouth Rinse 4 times per day   pantoprazole   40 mg Oral QHS   senna-docusate  2 tablet Oral BID   sodium chloride  flush  3 mL Intravenous Q12H   Continuous Infusions:  cefTRIAXone  (ROCEPHIN )  IV 1 g (02/08/24 1015)     LOS: 3 days    Time spent: 45 minutes spent on chart review, discussion with nursing staff, consultants, updating family and interview/physical exam; more than 50% of that time was spent in counseling and/or coordination of care.    Enrigue Harvard, DO [facility] Available via Epic secure chat 7am-7pm After these hours, please refer to coverage provider listed on [email or web address] 02/08/2024, 12:25 PM

## 2024-02-08 NOTE — Progress Notes (Signed)
 RT attempted two times to place patient on BIPAP for night. Pt states does not want to wear BIPAP tonight. Pt is currently on 3L [place] and is tolerating well. No distress noted. BIPAP is on standby at bedside. Will continue to monitor.

## 2024-02-09 ENCOUNTER — Other Ambulatory Visit: Payer: Self-pay

## 2024-02-09 ENCOUNTER — Other Ambulatory Visit (HOSPITAL_COMMUNITY): Payer: Self-pay

## 2024-02-09 DIAGNOSIS — J441 Chronic obstructive pulmonary disease with (acute) exacerbation: Secondary | ICD-10-CM | POA: Diagnosis not present

## 2024-02-09 DIAGNOSIS — R918 Other nonspecific abnormal finding of lung field: Secondary | ICD-10-CM

## 2024-02-09 LAB — BASIC METABOLIC PANEL WITH GFR
Anion gap: 6 (ref 5–15)
BUN: 20 mg/dL (ref 8–23)
CO2: 36 mmol/L — ABNORMAL HIGH (ref 22–32)
Calcium: 8.7 mg/dL — ABNORMAL LOW (ref 8.9–10.3)
Chloride: 95 mmol/L — ABNORMAL LOW (ref 98–111)
Creatinine, Ser: 0.5 mg/dL (ref 0.44–1.00)
GFR, Estimated: >60 mL/min (ref >60)
Glucose, Bld: 90 mg/dL (ref 70–99)
Potassium: 4 mmol/L (ref 3.5–5.1)
Sodium: 137 mmol/L (ref 135–145)

## 2024-02-09 LAB — CBC
HCT: 34.2 % — ABNORMAL LOW (ref 36.0–46.0)
Hemoglobin: 10.5 g/dL — ABNORMAL LOW (ref 12.0–15.0)
MCH: 30.4 pg (ref 26.0–34.0)
MCHC: 30.7 g/dL (ref 30.0–36.0)
MCV: 99.1 fL (ref 80.0–100.0)
Platelets: 203 10*3/uL (ref 150–400)
RBC: 3.45 MIL/uL — ABNORMAL LOW (ref 3.87–5.11)
RDW: 13.7 % (ref 11.5–15.5)
WBC: 8.3 10*3/uL (ref 4.0–10.5)
nRBC: 0 % (ref 0.0–0.2)

## 2024-02-09 MED ORDER — GUAIFENESIN-CODEINE 100-10 MG/5ML PO SOLN
5.0000 mL | ORAL | 0 refills | Status: DC | PRN
  Filled 2024-02-09: qty 120, 4d supply, fill #0

## 2024-02-09 MED ORDER — PREDNISONE 20 MG PO TABS: 40.0000 mg | ORAL_TABLET | Freq: Every day | ORAL | Status: DC

## 2024-02-09 MED ORDER — PANTOPRAZOLE SODIUM 40 MG PO TBEC
40.0000 mg | DELAYED_RELEASE_TABLET | Freq: Every day | ORAL | 0 refills | Status: DC
  Filled 2024-02-09: qty 30, 30d supply, fill #0

## 2024-02-09 MED ORDER — PREDNISONE 10 MG PO TABS
ORAL_TABLET | ORAL | 0 refills | Status: AC
  Filled 2024-02-09: qty 30, 12d supply, fill #0

## 2024-02-09 NOTE — Progress Notes (Signed)
 Discharge medications delivered to patietn at bedside D [facility]

## 2024-02-09 NOTE — Discharge Instructions (Signed)
Continue home O2. 

## 2024-02-09 NOTE — TOC Transition Note (Signed)
 Transition of Care [facility]) - Discharge Note   Patient Details  Name: [name] MRN: [id number] Date of Birth: [birth year]  Transition of Care [facility]) CM/SW Contact:  Ruben Corolla, RN Phone Number: 02/09/2024, 3:05 PM   Clinical Narrative:  d/c home w/HHC [facility];[employer]-rollator delivered to rm prior d/c.No further CM needs.     Final next level of care: Home w Home Health Services Barriers to Discharge: No Barriers Identified   Patient Goals and CMS Choice Patient states their goals for this hospitalization and ongoing recovery are:: Home CMS Medicare.gov Compare Post Acute Care list provided to:: Patient Choice offered to / list presented to : Patient [place] ownership interest in [facility].provided to:: Patient    Discharge Placement                       Discharge Plan and Services Additional resources added to the After Visit Summary for     Discharge Planning Services: CM Consult Post Acute Care Choice: Durable Medical Equipment, Home Health          DME Arranged: Walker rolling with seat DME Agency: [employer] Date DME Agency Contacted: 02/09/24 Time DME Agency Contacted: [id number] Representative spoke with at DME Agency: Zula Hitch HH Arranged: PT HH Agency: Other - See comment [facility]) Date HH Agency Contacted: 02/09/24 Time HH Agency Contacted: 1001 Representative spoke with at [facility] Agency: Shelvy Dickens  Social Drivers of Health (SDOH) Interventions SDOH Screenings   Food Insecurity: No Food Insecurity (02/06/2024)  Housing: Low Risk  (02/06/2024)  Transportation Needs: No Transportation Needs (02/06/2024)  Utilities: Not At Risk (02/06/2024)  Depression (PHQ2-9): Low Risk  (11/02/2022)  Tobacco Use: High Risk (02/07/2024)     Readmission Risk Interventions    02/06/2024   12:06 PM  Readmission Risk Prevention Plan  Transportation Screening Complete  PCP or Specialist Appt within 5-7 Days Complete  Home Care Screening Complete   Medication Review (RN CM) Complete

## 2024-02-09 NOTE — TOC Progression Note (Signed)
 Transition of Care [facility]) - Progression Note    Patient Details  Name: RANDALYN ELLERS MRN: [id number] Date of Birth: [birth year]  Transition of Care [facility]) CM/SW Contact  Gurnoor Ursua, Thersia Flax, RN Phone Number: 02/09/2024, 10:01 AM  Clinical Narrative: Noted per PCCM d/c NIV-see note-Patient does not want to pursue NIV @ home-[employer] rep Mitch notified. Active w/[facility]-HHPT;[employer] home 02- home 02 travel tank.      Expected Discharge Plan: Home w Home Health Services Barriers to Discharge: Continued Medical Work up  Expected Discharge Plan and Services   Discharge Planning Services: CM Consult Post Acute Care Choice: Durable Medical Equipment, Home Health Living arrangements for the past 2 months: Single Family Home                           HH Arranged: PT HH Agency: Other - See comment [facility]) Date HH Agency Contacted: 02/09/24 Time HH Agency Contacted: 1001 Representative spoke with at [facility] Agency: Shelvy Dickens   Social Determinants of Health (SDOH) Interventions SDOH Screenings   Food Insecurity: No Food Insecurity (02/06/2024)  Housing: Low Risk  (02/06/2024)  Transportation Needs: No Transportation Needs (02/06/2024)  Utilities: Not At Risk (02/06/2024)  Depression (PHQ2-9): Low Risk  (11/02/2022)  Tobacco Use: High Risk (02/07/2024)    Readmission Risk Interventions    02/06/2024   12:06 PM  Readmission Risk Prevention Plan  Transportation Screening Complete  PCP or Specialist Appt within 5-7 Days Complete  Home Care Screening Complete  Medication Review (RN CM) Complete

## 2024-02-09 NOTE — Discharge Summary (Signed)
 Physician Discharge Summary  TENEAL SHRYOCK ZOX:[id number] DOB: 1960-10-20 DOA: 02/05/2024  PCP: [name], MD  Admit date: 02/05/2024 Discharge date: 02/09/2024  Admitted From: Home Discharge disposition: Home   Recommendations for Outpatient Follow-Up:   Smoking cessation Long taper of steroids Continue pulmonary toiletry and nebs Home health  Discharge Diagnosis:   Principal Problem:   COPD exacerbation (HCC) Active Problems:   Acute on chronic respiratory failure with hypoxia and hypercapnia (HCC)   Multifocal pneumonia   Squamous cell lung cancer, right (HCC)   Tobacco dependence   Anxiety   Lung mass    Discharge Condition: Improved.  Diet recommendation: Regular.  Wound care: None.  Code status: Full.   History of Present Illness:   [name] is a 63 y.o. female with medical history significant of COPD, patient does use oxygen at home due to remitted at nighttime or during periods of exertion.  Patient was in her usual state of health till approximately 2 days ago when she reports show new onset of worsening sensation of shortness of breath.  It was present at rest worse with exertion and got progressively worse suspect patient could not sleep all night last night.  Patient does not report much new cough or expectoration any new chest pain or any leg swelling or fever.  Her major complaint is really just shortness of breath that got worse.   Daughter at the bedside corroborates above states that the patient was gasping for breath and could not speak in full sentences at home.  EMS was called and patient was found to be hypoxic to 80% SpO2 on her usual 2 L/min of supplementary oxygen.  Patient was given Solu-Medrol  and magnesium  as well as DuoNeb therapy by EMS.  Patient had oxygen uptitrated to 6 L/min a VBG done in the ER revealed CO2 retention with pH of 7.21.  Patient is status post noninvasive ventilatory therapy.  Patient finally requested  that the BiPAP be removed.  Repeat ABG shows a pH of 7.36.  Medical evaluation is sought.     Hospital Course by Problem:   COPD exacerbation (HCC) Baseline is 1.5 - 2lpm oxygen with active smoking but plans to quit - Taper of steroids - Nebs   Multifocal pneumonia -Finished treatment with antibiotics   Acute on chronic respiratory failure with hypoxia and hypercapnia (HCC) Initially requiring BiPAP, currently on 4 L of oxygen with baseline 2 L use.  Patient with new diagnosis of lung cancer and advanced COPD.  ABG with concern of hypercapnia -NIV ordered for home but patient refused    Squamous cell lung cancer, right (HCC) Recent diagnosis of squamous cell lung cancer involving right upper and part of middle lobe.  S/p PET scan and biopsy Not a candidate for any surgery or systemic chemotherapy due to poor underlying lung function and functional status -Radiation therapy was recommended-has not started yet -Outpatient follow-up   Tobacco dependence -Encouraged cessation     Cervical spine fracture (HCC) -chronic, continue with oxycodone  10 mg every 12 hours as needed for pain.  With acetaminophen  available in between.  Continue with Flexeril  as needed for spasm.  Patient reports not taking gabapentin .   Anxiety -chronic, patient takes Xanax  at home, 1 mg 3 times a day.  Corroborated by PDMP      Medical Consultants:   Pulm   Discharge Exam:   Vitals:   02/09/24 0818 02/09/24 1302  BP:  125/66  Pulse:  89  Resp:  18  Temp:  98.2 F (36.8 C)  SpO2: 100% 98%   Vitals:   02/09/24 0210 02/09/24 0620 02/09/24 0818 02/09/24 1302  BP:  131/72  125/66  Pulse: 81 88  89  Resp: 20 18  18   Temp:  98.4 F (36.9 C)  98.2 F (36.8 C)  TempSrc:  Oral    SpO2: 99% 94% 100% 98%  Weight:      Height:        General exam: Appears calm and comfortable.    The results of significant diagnostics from this hospitalization (including imaging, microbiology, ancillary  and laboratory) are listed below for reference.     Procedures and Diagnostic Studies:   DG Chest Port 1 View Result Date: 02/05/2024 CLINICAL DATA:  Dyspnea. EXAM: PORTABLE CHEST 1 VIEW COMPARISON:  01/23/2024. FINDINGS: Redemonstration of ill-defined faint opacity overlying the right mid lung zone which corresponds to patient's known right lung upper lobe mass. There are additional nonspecific heterogeneous opacities throughout bilateral lungs with lower lobe predominance, which are also similar to the prior study. There is new blunting of bilateral lateral costophrenic angles, which may be due to overlying lung parenchymal opacities versus pleural effusions. No pneumothorax on either side. Stable cardio-mediastinal silhouette. No acute osseous abnormalities. Old healed bilateral posterior rib fractures noted. The soft tissues are within normal limits. IMPRESSION: *Redemonstration of ill-defined opacity overlying the right mid lung zone, corresponding to patient's known right lung upper lobe mass. *There is new blunting of bilateral lateral costophrenic angles, which may be due to overlying lung parenchymal opacities versus pleural effusions. Electronically Signed   By: Beula Brunswick M.D.   On: 02/05/2024 16:32     Labs:   Basic Metabolic Panel: Recent Labs  Lab 02/05/24 1448 02/06/24 0255 02/09/24 0501  NA 134* 135 137  K 3.9 4.5 4.0  CL 93* 96* 95*  CO2 31 32 36*  GLUCOSE 129* 144* 90  BUN 14 16 20   CREATININE 0.70 0.64 0.50  CALCIUM  8.8* 8.7* 8.7*  MG 2.9*  --   --    GFR Estimated Creatinine Clearance: 66.5 mL/min (by C-G formula based on SCr of 0.5 mg/dL). Liver Function Tests: Recent Labs  Lab 02/05/24 1448  AST 12*  ALT 13  ALKPHOS 83  BILITOT 0.8  PROT 7.3  ALBUMIN 3.3*   No results for input(s): "LIPASE", "AMYLASE" in the last 168 hours. No results for input(s): "AMMONIA" in the last 168 hours. Coagulation profile Recent Labs  Lab 02/06/24 0255  INR 1.2     CBC: Recent Labs  Lab 02/05/24 1448 02/06/24 0255 02/09/24 0501  WBC 32.5* 24.5* 8.3  NEUTROABS 26.2*  --   --   HGB 12.7 11.4* 10.5*  HCT 41.4 38.0 34.2*  MCV 100.5* 100.8* 99.1  PLT 257 222 203   Cardiac Enzymes: No results for input(s): "CKTOTAL", "CKMB", "CKMBINDEX", "TROPONINI" in the last 168 hours. BNP: Invalid input(s): "POCBNP" CBG: No results for input(s): "GLUCAP" in the last 168 hours. D-Dimer No results for input(s): "DDIMER" in the last 72 hours. Hgb A1c No results for input(s): "HGBA1C" in the last 72 hours. Lipid Profile No results for input(s): "CHOL", "HDL", "LDLCALC", "TRIG", "CHOLHDL", "LDLDIRECT" in the last 72 hours. Thyroid function studies No results for input(s): "TSH", "T4TOTAL", "T3FREE", "THYROIDAB" in the last 72 hours.  Invalid input(s): "FREET3" Anemia work up No results for input(s): "VITAMINB12", "FOLATE", "FERRITIN", "TIBC", "IRON", "RETICCTPCT" in the last 72 hours. Microbiology Recent Results (from  the past 240 hours)  Resp panel by RT-PCR (RSV, Flu A&B, Covid) Anterior Nasal Swab     Status: None   Collection Time: 02/05/24  2:48 PM   Specimen: Anterior Nasal Swab  Result Value Ref Range Status   SARS Coronavirus 2 by RT PCR NEGATIVE NEGATIVE Final    Comment: (NOTE) SARS-CoV-2 target nucleic acids are NOT DETECTED.  The SARS-CoV-2 RNA is generally detectable in upper respiratory specimens during the acute phase of infection. The lowest concentration of SARS-CoV-2 viral copies this assay can detect is 138 copies/mL. A negative result does not preclude SARS-Cov-2 infection and should not be used as the sole basis for treatment or other patient management decisions. A negative result may occur with  improper specimen collection/handling, submission of specimen other than nasopharyngeal swab, presence of viral mutation(s) within the areas targeted by this assay, and inadequate number of viral copies(<138 copies/mL). A negative  result must be combined with clinical observations, patient history, and epidemiological information. The expected result is Negative.  Fact Sheet for Patients:  BloggerCourse.com  Fact Sheet for Healthcare Providers:  SeriousBroker.it  This test is no t yet approved or cleared by the [place]  FDA and  has been authorized for detection and/or diagnosis of SARS-CoV-2 by FDA under an Emergency Use Authorization (EUA). This EUA will remain  in effect (meaning this test can be used) for the duration of the COVID-19 declaration under Section 564(b)(1) of the Act, 21 U.S.C.section 360bbb-3(b)(1), unless the authorization is terminated  or revoked sooner.       Influenza A by PCR NEGATIVE NEGATIVE Final   Influenza B by PCR NEGATIVE NEGATIVE Final    Comment: (NOTE) The Xpert Xpress SARS-CoV-2/FLU/RSV plus assay is intended as an aid in the diagnosis of influenza from Nasopharyngeal swab specimens and should not be used as a sole basis for treatment. Nasal washings and aspirates are unacceptable for Xpert Xpress SARS-CoV-2/FLU/RSV testing.  Fact Sheet for Patients: BloggerCourse.com  Fact Sheet for Healthcare Providers: SeriousBroker.it  This test is not yet approved or cleared by the [place]  FDA and has been authorized for detection and/or diagnosis of SARS-CoV-2 by FDA under an Emergency Use Authorization (EUA). This EUA will remain in effect (meaning this test can be used) for the duration of the COVID-19 declaration under Section 564(b)(1) of the Act, 21 U.S.C. section 360bbb-3(b)(1), unless the authorization is terminated or revoked.     Resp Syncytial Virus by PCR NEGATIVE NEGATIVE Final    Comment: (NOTE) Fact Sheet for Patients: BloggerCourse.com  Fact Sheet for Healthcare Providers: SeriousBroker.it  This  test is not yet approved or cleared by the [place]  FDA and has been authorized for detection and/or diagnosis of SARS-CoV-2 by FDA under an Emergency Use Authorization (EUA). This EUA will remain in effect (meaning this test can be used) for the duration of the COVID-19 declaration under Section 564(b)(1) of the Act, 21 U.S.C. section 360bbb-3(b)(1), unless the authorization is terminated or revoked.  Performed at [facility], [address] [place], [place] [address]   MRSA Next Gen by PCR, Nasal     Status: None   Collection Time: 02/05/24  6:39 PM   Specimen: Nasal Mucosa; Nasal Swab  Result Value Ref Range Status   MRSA by PCR Next Gen NOT DETECTED NOT DETECTED Final    Comment: (NOTE) The GeneXpert MRSA Assay (FDA approved for NASAL specimens only), is one component of a comprehensive MRSA colonization surveillance program. It is not intended  to diagnose MRSA infection nor to guide or monitor treatment for MRSA infections. Test performance is not FDA approved in patients less than 77 years old. Performed at [facility], [address] [place], [place] [address]      Discharge Instructions:   Discharge Instructions     Diet general   Complete by: As directed    Discharge instructions   Complete by: As directed    Stop smoking, continue flutter valve   Increase activity slowly   Complete by: As directed       Allergies as of 02/09/2024       Reactions   Aspirin Other (See Comments)   REACTION: had h. pylori due to too many goody powders. told not to take any more asa        Medication List     STOP taking these medications    gabapentin  100 MG capsule Commonly known as: NEURONTIN    methylPREDNISolone  4 MG Tbpk tablet Commonly known as: MEDROL  DOSEPAK       TAKE these medications    acetaminophen  500 MG tablet Commonly known as: TYLENOL  Take 1,000 mg by mouth every 8 (eight) hours.   albuterol  108  (90 Base) MCG/ACT inhaler Commonly known as: VENTOLIN  HFA INHALE 2 PUFFS INTO THE LUNGS EVERY 4 HOURS AS NEEDED FOR WHEEZING OR SHORTNESS OF BREATH.   ALPRAZolam  1 MG tablet Commonly known as: XANAX  Take 1 mg by mouth 3 (three) times daily.   cyclobenzaprine  10 MG tablet Commonly known as: FLEXERIL  Take 10 mg by mouth 2 (two) times daily as needed for muscle spasms.   guaiFENesin  100 MG/5ML liquid Commonly known as: ROBITUSSIN Take 5 mLs by mouth every 4 (four) hours as needed for cough or to loosen phlegm.   ipratropium-albuterol  0.5-2.5 (3) MG/3ML Soln Commonly known as: DUONEB Take 3 mLs by nebulization every 6 (six) hours as needed. What changed: reasons to take this   nicotine  21 mg/24hr patch Commonly known as: NICODERM CQ  - dosed in mg/24 hours Place 1 patch (21 mg total) onto the skin daily.   Oxycodone  HCl 10 MG Tabs Take 10 mg by mouth in the morning and at bedtime.   pantoprazole  40 MG tablet Commonly known as: PROTONIX  Take 1 tablet (40 mg total) by mouth at bedtime.   predniSONE  10 MG tablet Commonly known as: DELTASONE  40 mg x 3 days then 30mg  x 3 days then 20mg  x 3 days then 10mg  x3 days then stop Start taking on: Feb 10, 2024   Trelegy Ellipta  100-62.5-25 MCG/ACT Aepb Generic drug: Fluticasone -Umeclidin-Vilant Inhale 1 puff into the lungs daily at 2 PM.               Durable Medical Equipment  (From admission, onward)           Start     Ordered   02/09/24 1058  For home use only DME 4 wheeled rolling walker with seat  Once       Question:  Patient needs a walker to treat with the following condition  Answer:  COPD (chronic obstructive pulmonary disease) (HCC)   02/09/24 1057            Follow-up Information     [facility], [place] Follow up.   Why: HH physical therapy Contact information: [address] [place] [address] [phone]         [facility] Follow up.   Why:  rollator  Contact information: [address] Fredi January [phone]                 Time coordinating discharge: 45 min  Signed:  Enrigue Harvard DO  [facility] 02/09/2024, 1:23 PM

## 2024-02-09 NOTE — Progress Notes (Signed)
 Per Dr. Hortense Lyons last note: She does not want to pursue NIV at home, does not feel like she would tolerate it.  Will d/c order if not already done. JV
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
## 2024-02-09 NOTE — TOC Progression Note (Signed)
 Transition of Care [facility]) - Progression Note    Patient Details  Name: [name] MRN: [id number] Date of Birth: 06-Aug-1961  [facility]) CM/SW Contact  Abel Hageman, Thersia Flax, RN Phone Number: 02/09/2024, 11:08 AM  Clinical Narrative: Patient ordered for rollator-patient agree to [facility]-rep Jermaine to deliver to rm prior d/c(they will discuss insurance info) then patient can decide if she wants it or not-[employer] rep Jermaine updated.      Expected Discharge Plan: Home w Home Health Services Barriers to Discharge: Continued Medical Work up  Expected Discharge Plan and Services   Discharge Planning Services: CM Consult Post Acute Care Choice: Durable Medical Equipment, Home Health Living arrangements for the past 2 months: Single Family Home                 DME Arranged: Walker rolling with seat DME Agency: [employer] Date DME Agency Contacted: 02/09/24 Time DME Agency Contacted: 1108 Representative spoke with at DME Agency: Zula Hitch HH Arranged: PT HH Agency: Other - See comment [facility]) Date HH Agency Contacted: 02/09/24 Time HH Agency Contacted: 1001 Representative spoke with at [facility] Agency: Shelvy Dickens   Social Determinants of Health (SDOH) Interventions SDOH Screenings   Food Insecurity: No Food Insecurity (02/06/2024)  Housing: Low Risk  (02/06/2024)  Transportation Needs: No Transportation Needs (02/06/2024)  Utilities: Not At Risk (02/06/2024)  Depression (PHQ2-9): Low Risk  (11/02/2022)  Tobacco Use: High Risk (02/07/2024)    Readmission Risk Interventions    02/06/2024   12:06 PM  Readmission Risk Prevention Plan  Transportation Screening Complete  PCP or Specialist Appt within 5-7 Days Complete  Home Care Screening Complete  Medication Review (RN CM) Complete

## 2024-02-12 ENCOUNTER — Telehealth: Payer: Self-pay

## 2024-02-12 ENCOUNTER — Inpatient Hospital Stay: Attending: Internal Medicine | Admitting: Internal Medicine

## 2024-02-12 ENCOUNTER — Inpatient Hospital Stay

## 2024-02-12 VITALS — BP 125/72 | HR 92 | Temp 98.1°F | Resp 17 | Ht 66.0 in | Wt 133.5 lb

## 2024-02-12 DIAGNOSIS — Z9981 Dependence on supplemental oxygen: Secondary | ICD-10-CM | POA: Insufficient documentation

## 2024-02-12 DIAGNOSIS — Z800 Family history of malignant neoplasm of digestive organs: Secondary | ICD-10-CM | POA: Insufficient documentation

## 2024-02-12 DIAGNOSIS — Z803 Family history of malignant neoplasm of breast: Secondary | ICD-10-CM | POA: Diagnosis not present

## 2024-02-12 DIAGNOSIS — Z801 Family history of malignant neoplasm of trachea, bronchus and lung: Secondary | ICD-10-CM | POA: Insufficient documentation

## 2024-02-12 DIAGNOSIS — J449 Chronic obstructive pulmonary disease, unspecified: Secondary | ICD-10-CM | POA: Diagnosis not present

## 2024-02-12 DIAGNOSIS — F17210 Nicotine dependence, cigarettes, uncomplicated: Secondary | ICD-10-CM | POA: Insufficient documentation

## 2024-02-12 DIAGNOSIS — R918 Other nonspecific abnormal finding of lung field: Secondary | ICD-10-CM

## 2024-02-12 DIAGNOSIS — C3411 Malignant neoplasm of upper lobe, right bronchus or lung: Secondary | ICD-10-CM | POA: Insufficient documentation

## 2024-02-12 DIAGNOSIS — C3490 Malignant neoplasm of unspecified part of unspecified bronchus or lung: Secondary | ICD-10-CM

## 2024-02-12 LAB — CBC WITH DIFFERENTIAL (CANCER CENTER ONLY)
Abs Immature Granulocytes: 0.18 10*3/uL — ABNORMAL HIGH (ref 0.00–0.07)
Basophils Absolute: 0 10*3/uL (ref 0.0–0.1)
Basophils Relative: 0 %
Eosinophils Absolute: 0 10*3/uL (ref 0.0–0.5)
Eosinophils Relative: 0 %
HCT: 40.4 % (ref 36.0–46.0)
Hemoglobin: 12.9 g/dL (ref 12.0–15.0)
Immature Granulocytes: 1 %
Lymphocytes Relative: 6 %
Lymphs Abs: 0.8 10*3/uL (ref 0.7–4.0)
MCH: 29.9 pg (ref 26.0–34.0)
MCHC: 31.9 g/dL (ref 30.0–36.0)
MCV: 93.5 fL (ref 80.0–100.0)
Monocytes Absolute: 0.1 10*3/uL (ref 0.1–1.0)
Monocytes Relative: 1 %
Neutro Abs: 13.1 10*3/uL — ABNORMAL HIGH (ref 1.7–7.7)
Neutrophils Relative %: 92 %
Platelet Count: 363 10*3/uL (ref 150–400)
RBC: 4.32 MIL/uL (ref 3.87–5.11)
RDW: 13.9 % (ref 11.5–15.5)
WBC Count: 14.2 10*3/uL — ABNORMAL HIGH (ref 4.0–10.5)
nRBC: 0 % (ref 0.0–0.2)

## 2024-02-12 LAB — CMP (CANCER CENTER ONLY)
ALT: 22 U/L (ref 0–44)
AST: 16 U/L (ref 15–41)
Albumin: 4 g/dL (ref 3.5–5.0)
Alkaline Phosphatase: 61 U/L (ref 38–126)
Anion gap: 7 (ref 5–15)
BUN: 14 mg/dL (ref 8–23)
CO2: 34 mmol/L — ABNORMAL HIGH (ref 22–32)
Calcium: 9.8 mg/dL (ref 8.9–10.3)
Chloride: 96 mmol/L — ABNORMAL LOW (ref 98–111)
Creatinine: 0.69 mg/dL (ref 0.44–1.00)
GFR, Estimated: >60 mL/min (ref >60)
Glucose, Bld: 175 mg/dL — ABNORMAL HIGH (ref 70–99)
Potassium: 4.6 mmol/L (ref 3.5–5.1)
Sodium: 137 mmol/L (ref 135–145)
Total Bilirubin: 0.4 mg/dL (ref 0.0–1.2)
Total Protein: 7.4 g/dL (ref 6.5–8.1)

## 2024-02-12 NOTE — Telephone Encounter (Signed)
 Noted.  Kristian Covey MD [facility]

## 2024-02-12 NOTE — Transitions of Care (Post Inpatient/ED Visit) (Signed)
 02/12/2024  Name: [name] MRN: [id number] DOB: [birth year]  Today's TOC FU Call Status: Today's TOC FU Call Status:: Successful TOC FU Call Completed TOC FU Call Complete Date: 02/12/24 Patient's Name and Date of Birth confirmed.  Transition Care Management Follow-up Telephone Call Date of Discharge: 02/09/24 Discharge Facility: [facility]) Type of Discharge: Inpatient Admission Primary Inpatient Discharge Diagnosis:: COPD exacerbation How have you been since you were released from the hospital?: Better (feels weak,  feels congestion) Any questions or concerns?: No  Items Reviewed: Did you receive and understand the discharge instructions provided?: Yes Medications obtained,verified, and reconciled?: Yes (Medications Reviewed) Any new allergies since your discharge?: No Dietary orders reviewed?: Yes Type of Diet Ordered:: regular Do you have support at home?: Yes People in Home [RPT]: sibling(s) Name of Support/Comfort Primary Source: [name]  Medications Reviewed Today: Medications Reviewed Today     Reviewed by Vanetta Generous, RN (Registered Nurse) on 02/12/24 at 1006  Med List Status: <None>   Medication Order Taking? Sig Documenting Provider Last Dose Status Informant  acetaminophen  (TYLENOL ) 500 MG tablet [id number] Yes Take 1,000 mg by mouth every 8 (eight) hours. [provider] Taking Active Self, Pharmacy Records, Multiple Informants  albuterol  (VENTOLIN  HFA) 108 (90 Base) MCG/ACT inhaler [id number] Yes INHALE 2 PUFFS INTO THE LUNGS EVERY 4 HOURS AS NEEDED FOR WHEEZING OR SHORTNESS OF BREATH. [name], MD Taking Active Self, Pharmacy Records, Multiple Informants           Med Note [name] Dec 24, 2023  5:01 PM) LF: 12/14/23 for a 30ds  ALPRAZolam  (XANAX ) 1 MG tablet [id number] Yes Take 1 mg by mouth 3 (three) times daily. [provider] Taking Active Self, Pharmacy Records, Multiple Informants           Med Note  [name], [name]   Tue Feb 06, 2024 12:11 PM)    cyclobenzaprine  (FLEXERIL ) 10 MG tablet [id number] No Take 10 mg by mouth 2 (two) times daily as needed for muscle spasms.  Patient not taking: Reported on 02/12/2024   [provider] Not Taking Active Self, Pharmacy Records, Multiple Informants  Fluticasone -Umeclidin-Vilant (TRELEGY ELLIPTA ) 100-62.5-25 MCG/ACT AEPB [id number] Yes Inhale 1 puff into the lungs daily at 2 PM. [name], NP Taking Active Self, Pharmacy Records, Multiple Informants  guaiFENesin -codeine  (GUAIATUSSIN AC) 100-10 MG/5ML syrup [id number] Yes Take 5 mLs by mouth every 4 (four) hours as needed for cough or to loosen phlegm. [name], [name], DO Taking Active   ipratropium-albuterol  (DUONEB) 0.5-2.5 (3) MG/3ML SOLN [id number] Yes Take 3 mLs by nebulization every 6 (six) hours as needed.  Patient taking differently: Take 3 mLs by nebulization every 6 (six) hours as needed (Wheezing/SOB).   [name], [name], MD Taking Active Self, Pharmacy Records, Multiple Informants           Med Note [name], [name]   Tue Feb 06, 2024 12:11 PM)    nicotine  (NICODERM CQ  - DOSED IN MG/24 HOURS) 21 mg/24hr patch [id number] Yes Place 1 patch (21 mg total) onto the skin daily. [name], DO Taking Active Self, Pharmacy Records, Multiple Informants  Oxycodone  HCl 10 MG TABS [id number] Yes Take 10 mg by mouth in the morning and at bedtime. [provider] Taking Active Self, Pharmacy Records, Multiple Informants  pantoprazole  (PROTONIX ) 40 MG tablet [id number] No Take 1 tablet (40 mg total) by mouth at bedtime.  Patient not taking: Reported on 02/12/2024   [name], [name], DO Not Taking Active   predniSONE  (DELTASONE ) 10 MG tablet [id number] Yes Take 4 tablets (40 mg total) daily for 3 days, THEN 3 tablets (30 mg total) daily for 3 days, THEN 2 tablets (20 mg total) daily for 3 days, THEN 1 tablet (10 mg total) daily for 3 days.  Then stop [name], [name], DO Taking Active              Home Care and Equipment/Supplies: Were Home Health Services Ordered?: Yes Name of Home Health Agency:: [facility] Has Agency set up a time to come to your home?: Yes (PT called patient and patient did not accept home visit due to numerous appointments and feeling overwhelmed.) First Home Health Visit Date:  (patient did not schedule when appointment was offered  by Home health) Any new equipment or medical supplies ordered?: No  Functional Questionnaire: Do you need assistance with bathing/showering or dressing?: No Do you need assistance with meal preparation?: No Do you need assistance with eating?: No Do you have difficulty maintaining continence: No Do you need assistance with getting out of bed/getting out of a chair/moving?: No Do you have difficulty managing or taking your medications?: No  Follow up appointments reviewed: PCP Follow-up appointment confirmed?: No (offered to assist with scheduling and patient declined and states she will do it herself.) MD Provider Line [phone] Given: No Specialist Hospital Follow-up appointment confirmed?: Yes Date of Specialist follow-up appointment?: 02/12/24 Follow-Up Specialty Provider:: [name] Do you need transportation to your follow-up appointment?: No Do you understand care options if your condition(s) worsen?: Yes-patient verbalized understanding  SDOH Interventions Today    Flowsheet Row Most Recent Value  SDOH Interventions   Food Insecurity Interventions Intervention Not Indicated  Housing Interventions Intervention Not Indicated  Transportation Interventions Intervention Not Indicated  Utilities Interventions Intervention Not Indicated      Patient reports that she feels very congested. States that she needs to cough up "this stuff" but it is not coming up. Patient states that she overwhelmed with all her appointments and her diagnosis of lung cancer.  Reports she  has good support with her sister.  Patient reports that she is taking her medications as prescribed, Reports using her oxygen and nebs are prescribed.  Reviewed and offered 30 day TOC program and patient declined.  Provided my contact information for patient to call me if needed.  Offered to assist with PCP follow up appointment and patient declined.   Orpha Blade, RN, BSN, CEN [employer]- Transition of Care Team.  [facility] [phone]

## 2024-02-12 NOTE — Telephone Encounter (Signed)
 Based on note taken nothing further needed at this time
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
## 2024-02-12 NOTE — Telephone Encounter (Signed)
 Copied from CRM [id number]. Topic: Clinical - Home Health Verbal Orders >> Feb 12, 2024  4:22 PM Elle L wrote: Caller/Agency: [facility] Number: [phone] Service Requested: Physical Therapy Frequency: [facility] called to advise that they will be moving her admission to physical therapy home healthcare to 5/8.  Any new concerns about the patient? No

## 2024-02-12 NOTE — Telephone Encounter (Signed)
 Copied from CRM [id number]. Topic: General - Other >> Feb 12, 2024  9:05 AM Georgeann Kindred wrote: Reason for CRM: Darnell from [facility] called to inform provider that patient was discharged on Friday, 05/02, from [facility]. They attempted to see patient on Saturday but was asked by the patient if they could come out on Monday, 05/05, to resume her physical therapy.

## 2024-02-12 NOTE — Progress Notes (Signed)
 Radiation Oncology         [phone] ________________________________  Name: [name]        MRN: [id number]  Date of Service: 02/13/2024 DOB: 12/01/1960  WU:[id number], Kaitlyn Shouts, MD  Kaitlyn Flake, MD     REFERRING PHYSICIAN: Denson Flake, MD   DIAGNOSIS: The encounter diagnosis was Malignant neoplasm of upper lobe of right lung [facility]).   HISTORY OF PRESENT ILLNESS: [name] is a 63 y.o. female seen at the request of [name] for a diagnosis of lung cancer.  The patient was seen in the emergency department with shortness of breath in March 2025 and imaging on 12/24/2023 showed a perihilar mass in the right upper lobe measuring 3.8 cm, a 7 mm right upper lobe nodule, a 1.6 cm subpleural nodule in the anterior aspect of the right upper lobe as well as a 1.3 cm right hilar lymph node with 9 mm right paratracheal lymph node.  There was endplate compression fracture at T6 favored to be chronic, and evidence of atherosclerotic disease of the aorta and coronary vessels.  A CT head without contrast that day showed thickening in the right maxillary and sphenoid sinus and no intracranial abnormalities.  A PET on 01/12/2024 showed hypermetabolic activity within the lesion seen on CT in the right upper lobe adjacent to the right hilum consistent with a malignant mass measuring 3 cm, no other hypermetabolic uptake was identified and the prominent right paratracheal node did not show any abnormal uptake.  She underwent a bronchoscopy on 01/23/2024 and cytology showed squamous cell carcinoma in the fine-needle aspirate and biopsies of the right upper lobe.  Discussion in multidisciplinary thoracic oncology conference was to consider neoadjuvant chemoimmunotherapy followed by consideration of surgical resection versus chemoradiation.  Of note the patient was recently hospitalized for COPD exacerbation between 02/05/2024 and 02/09/2024.  She saw Dr. Marguerita [name] yesterday who recommended***    PREVIOUS  RADIATION THERAPY: {EXAM; YES/NO:19492::"No"}   PAST MEDICAL HISTORY:  Past Medical History:  Diagnosis Date   Arthritis    cervical spine   ASTHMA UNSPECIFIED WITH EXACERBATION 07/21/2010   CARPAL TUNNEL SYNDROME, BILATERAL 01/16/2008   COPD 05/08/2008   Dyspnea    intermittent. worsens with anxiety   GERD 04/18/2007   HELICOBACTER PYLORI GASTRITIS, HX OF 04/18/2007   PANIC DISORDER 04/18/2007   Pneumonia    yrs ago   TOBACCO ABUSE 07/17/2009       PAST SURGICAL HISTORY: Past Surgical History:  Procedure Laterality Date   ANTERIOR CERVICAL DECOMP/DISCECTOMY FUSION N/A 11/19/2021   Procedure: CERVICAL FOUR-FIVE, CERVICAL FIVE-SIX, CERVICAL SIX-SEVEN ANTERIOR CERVICAL DECOMPRESSION/DISCECTOMY FUSION;  Surgeon: Kaitlyn Mar, MD;  Location: [facility] OR;  Service: Neurosurgery;  Laterality: N/A;   APPENDECTOMY     BRONCHIAL BIOPSY  01/23/2024   Procedure: BRONCHOSCOPY, WITH BIOPSY;  Surgeon: Kaitlyn Flake, MD;  Location: [facility] ENDOSCOPY;  Service: Pulmonary;;   BRONCHIAL NEEDLE ASPIRATION BIOPSY  01/23/2024   Procedure: BRONCHOSCOPY, WITH NEEDLE ASPIRATION BIOPSY;  Surgeon: Kaitlyn Flake, MD;  Location: [facility] ENDOSCOPY;  Service: Pulmonary;;   OOPHORECTOMY     POSTERIOR CERVICAL FUSION/FORAMINOTOMY N/A 03/25/2022   Procedure: Cervical Three-Cervical Seven Posterior Cervical Fusion with Lateral Mass Fixation;  Surgeon: Kaitlyn Mar, MD;  Location: [facility] OR;  Service: Neurosurgery;  Laterality: N/A;     FAMILY HISTORY:  Family History  Problem Relation Age of Onset   Diabetes Mother    Cancer Father        lung  smoke    Heart disease Father    Clotting disorder Brother    Breast cancer Paternal Aunt    Stomach cancer Paternal Uncle    Esophageal cancer Neg Hx    Colon cancer Neg Hx      SOCIAL HISTORY:  reports that she has been smoking cigarettes. She started smoking about 50 years ago. She has a 74.5 pack-year smoking history. She has never used smokeless tobacco. She reports  current alcohol use. She reports that she does not use drugs.   ALLERGIES: Aspirin   MEDICATIONS:  Current Outpatient Medications  Medication Sig Dispense Refill   acetaminophen  (TYLENOL ) 500 MG tablet Take 1,000 mg by mouth every 8 (eight) hours.     albuterol  (VENTOLIN  HFA) 108 (90 Base) MCG/ACT inhaler INHALE 2 PUFFS INTO THE LUNGS EVERY 4 HOURS AS NEEDED FOR WHEEZING OR SHORTNESS OF BREATH. 18 each 1   ALPRAZolam  (XANAX ) 1 MG tablet Take 1 mg by mouth 3 (three) times daily.     cyclobenzaprine  (FLEXERIL ) 10 MG tablet Take 10 mg by mouth 2 (two) times daily as needed for muscle spasms.     Fluticasone -Umeclidin-Vilant (TRELEGY ELLIPTA ) 100-62.5-25 MCG/ACT AEPB Inhale 1 puff into the lungs daily at 2 PM.     guaiFENesin -codeine  (GUAIATUSSIN AC) 100-10 MG/5ML syrup Take 5 mLs by mouth every 4 (four) hours as needed for cough or to loosen phlegm. 120 mL 0   ipratropium-albuterol  (DUONEB) 0.5-2.5 (3) MG/3ML SOLN Take 3 mLs by nebulization every 6 (six) hours as needed. (Patient taking differently: Take 3 mLs by nebulization every 6 (six) hours as needed (Wheezing/SOB).) 360 mL 5   nicotine  (NICODERM CQ  - DOSED IN MG/24 HOURS) 21 mg/24hr patch Place 1 patch (21 mg total) onto the skin daily. 28 patch 0   Oxycodone  HCl 10 MG TABS Take 10 mg by mouth in the morning and at bedtime.     pantoprazole  (PROTONIX ) 40 MG tablet Take 1 tablet (40 mg total) by mouth at bedtime. 30 tablet 0   predniSONE  (DELTASONE ) 10 MG tablet Take 4 tablets (40 mg total) daily for 3 days, THEN 3 tablets (30 mg total) daily for 3 days, THEN 2 tablets (20 mg total) daily for 3 days, THEN 1 tablet (10 mg total) daily for 3 days.  Then stop 30 tablet 0   No current facility-administered medications for this visit.     REVIEW OF SYSTEMS: On review of systems, the patient reports that *** is doing well overall. *** denies any chest pain, shortness of breath, cough, fevers, chills, night sweats, unintended weight changes. ***  denies any bowel or bladder disturbances, and denies abdominal pain, nausea or vomiting. *** denies any new musculoskeletal or joint aches or pains. A complete review of systems is obtained and is otherwise negative.     PHYSICAL EXAM:  Wt Readings from Last 3 Encounters:  02/05/24 132 lb 15 oz (60.3 kg)  01/30/24 133 lb 3.2 oz (60.4 kg)  01/23/24 131 lb (59.4 kg)   Temp Readings from Last 3 Encounters:  02/09/24 98.2 F (36.8 C)  01/23/24 99 F (37.2 C)  01/01/24 (!) 97.4 F (36.3 C) (Oral)   BP Readings from Last 3 Encounters:  02/09/24 125/66  01/30/24 113/70  01/23/24 (!) 101/53   Pulse Readings from Last 3 Encounters:  02/09/24 89  01/30/24 95  01/23/24 (!) 102    /10  In general this is a well appearing *** in no acute distress. ***'s alert and oriented  x4 and appropriate throughout the examination. Cardiopulmonary assessment is negative for acute distress and *** exhibits normal effort.     ECOG = ***  0 - Asymptomatic (Fully active, able to carry on all predisease activities without restriction)  1 - Symptomatic but completely ambulatory (Restricted in physically strenuous activity but ambulatory and able to carry out work of a light or sedentary nature. For example, light housework, office work)  2 - Symptomatic, <50% in bed during the day (Ambulatory and capable of all self care but unable to carry out any work activities. Up and about more than 50% of waking hours)  3 - Symptomatic, >50% in bed, but not bedbound (Capable of only limited self-care, confined to bed or chair 50% or more of waking hours)  4 - Bedbound (Completely disabled. Cannot carry on any self-care. Totally confined to bed or chair)  5 - Death   Aurea Blossom MM, Creech RH, Tormey DC, et al. [id number]). "Toxicity and response criteria of the [facility]". Am. Hillard [employer]es. Oncol. 5 (6): 649-55    LABORATORY DATA:  Lab Results  Component Value Date   WBC 8.3 02/09/2024   HGB 10.5  (L) 02/09/2024   HCT 34.2 (L) 02/09/2024   MCV 99.1 02/09/2024   PLT 203 02/09/2024   Lab Results  Component Value Date   NA 137 02/09/2024   K 4.0 02/09/2024   CL 95 (L) 02/09/2024   CO2 36 (H) 02/09/2024   Lab Results  Component Value Date   ALT 13 02/05/2024   AST 12 (L) 02/05/2024   ALKPHOS 83 02/05/2024   BILITOT 0.8 02/05/2024      RADIOGRAPHY: DG Chest Port 1 View Result Date: 02/05/2024 CLINICAL DATA:  Dyspnea. EXAM: PORTABLE CHEST 1 VIEW COMPARISON:  01/23/2024. FINDINGS: Redemonstration of ill-defined faint opacity overlying the right mid lung zone which corresponds to patient's known right lung upper lobe mass. There are additional nonspecific heterogeneous opacities throughout bilateral lungs with lower lobe predominance, which are also similar to the prior study. There is new blunting of bilateral lateral costophrenic angles, which may be due to overlying lung parenchymal opacities versus pleural effusions. No pneumothorax on either side. Stable cardio-mediastinal silhouette. No acute osseous abnormalities. Old healed bilateral posterior rib fractures noted. The soft tissues are within normal limits. IMPRESSION: *Redemonstration of ill-defined opacity overlying the right mid lung zone, corresponding to patient's known right lung upper lobe mass. *There is new blunting of bilateral lateral costophrenic angles, which may be due to overlying lung parenchymal opacities versus pleural effusions. Electronically Signed   By: Beula Brunswick M.D.   On: 02/05/2024 16:32   CT Super D Chest Wo Contrast Result Date: 01/23/2024 CLINICAL DATA:  Provided history "". History from EMR right upper lobe mass, status post bronchoscopy and tissue sampling." * Tracking Code: BO * EXAM: CT CHEST WITHOUT CONTRAST TECHNIQUE: Multidetector CT imaging of the chest was performed using thin slice collimation for electromagnetic bronchoscopy planning purposes, without intravenous contrast. RADIATION DOSE  REDUCTION: This exam was performed according to the departmental dose-optimization program which includes automated exposure control, adjustment of the mA and/or kV according to patient size and/or use of iterative reconstruction technique. COMPARISON:  PET-CT scan from 01/12/2024. FINDINGS: Cardiovascular: Normal cardiac size. No pericardial effusion. No aortic aneurysm. There are coronary artery calcifications, in keeping with coronary artery disease. There are also mild peripheral atherosclerotic vascular calcifications of thoracic aorta and its major branches. Mediastinum/Nodes: Visualized thyroid gland appears grossly unremarkable. No solid /  cystic mediastinal masses. The esophagus is nondistended precluding optimal assessment. There are few mildly prominent mediastinal lymph nodes, which do not meet the size criteria for lymphadenopathy and appear grossly similar to the prior study. No axillary lymphadenopathy by size criteria. Evaluation of bilateral hila is limited due to lack on intravenous contrast: however, no large hilar lymphadenopathy identified. Lungs/Pleura: The central tracheo-bronchial tree is patent. There are mild to moderate centrilobular emphysematous changes, with upper lobe predominance. Redemonstration of patient's known mass in the right lung upper lobe, posterior segment measuring 3.1 x 3.5 cm, slightly increased since the prior study when it measured up to 2.8 x 3.3 cm, when measured in similar fashion. There is new minimal opacity distally, likely due to post obstructive changes. Redemonstration of small grouping of tree-in-bud configuration nodules in the right lung lower lobe, which is unchanged and may represent sequela of prior infection or inflammation. No new mass or consolidation. No pleural effusion or pneumothorax. No suspicious lung nodules. Upper Abdomen: Scattered calcified granulomas noted in the liver. Remaining visualized upper abdominal viscera within normal limits.  Musculoskeletal: The visualized soft tissues of the chest wall are grossly unremarkable. No suspicious osseous lesions. There are mild multilevel degenerative changes in the visualized spine. Redemonstration of mild compression deformities of T4, T6 and T8 vertebrae, similar to the prior study from 12/24/2023. No significant retropulsion or spinal canal compromise. C4-C7 ACDF noted. IMPRESSION: 1. There is redemonstration of patient's known right lung upper lobe mass, which is slightly increased in size since the prior study. No new lung mass or consolidation. No new lymphadenopathy. 2. Multiple other nonacute observations, as described above. Aortic Atherosclerosis (ICD10-I70.0) and Emphysema (ICD10-J43.9). Electronically Signed   By: Beula Brunswick M.D.   On: 01/23/2024 10:36   DG Chest Port 1 View Result Date: 01/23/2024 CLINICAL DATA:  [id number] S/P bronchoscopy with biopsy [id number] EXAM: PORTABLE CHEST 1 VIEW COMPARISON:  12/24/2023. FINDINGS: Redemonstration of a well-circumscribed 3.1 x 3.6 cm mass overlying the right mid lung zone, which appears enlarged in the interim. Bilateral lung fields are otherwise clear. Bilateral costophrenic angles are clear. No pneumothorax. Normal cardio-mediastinal silhouette. No acute osseous abnormalities. Redemonstration of multiple old left upper posteromedial rib fractures. Lower cervical spinal fixation hardware noted. The soft tissues are within normal limits. IMPRESSION: Redemonstration of a well-circumscribed 3.1 x 3.6 cm mass overlying the right mid lung zone, which appears enlarged in the interim. No pneumothorax. Electronically Signed   By: Beula Brunswick M.D.   On: 01/23/2024 10:25   DG C-ARM BRONCHOSCOPY Result Date: 01/23/2024 C-ARM BRONCHOSCOPY: Fluoroscopy was utilized by the requesting physician.  No radiographic interpretation.       IMPRESSION/PLAN: 1. At least Stage IA3 versus IIIA, cT1cN0-2M0, NSCLC, squamous cell carcinoma of the RUL. Dr. Jeryl Moris  discusses the pathology findings and reviews the nature of ***. Discussion in thoracic oncology conference included neoadjuvant chemoimmunotherapy followed by possible resection versus chemoRT if not a surgical candidate.  We discussed the risks, benefits, short, and long term effects of radiotherapy, as well as the curative intent, and the patient is interested in proceeding. Dr. Jeryl Moris discusses the delivery and logistics of radiotherapy and anticipates a course of up to 6 1/2 weeks of radiotherapy if she were not a surgical candidate.***   2. Recent COPD exacerbation with multifocal pneumonia.  The patient has completed antibiotics and is tapering her steroids, she was discharged on 4 L of oxygen and will be working with pulmonary medicine to determine her baseline oxygen needs.  In a visit lasting *** minutes, greater than 50% of the time was spent face to face discussing the patient's condition, in preparation for the discussion, and coordinating the patient's care.   The above documentation reflects my direct findings during this shared patient visit. Please see the separate note by Dr. Jeryl Moris on this date for the remainder of the patient's plan of care.    Shelvia Dick, [facility]   **Disclaimer: This note was dictated with voice recognition software. Similar sounding words can inadvertently be transcribed and this note may contain transcription errors which may not have been corrected upon publication of note.**

## 2024-02-13 ENCOUNTER — Ambulatory Visit
Admission: RE | Admit: 2024-02-13 | Discharge: 2024-02-13 | Disposition: A | Discharge status: Home / Self Care | Source: Ambulatory Visit | Attending: Radiation Oncology | Admitting: Radiation Oncology

## 2024-02-13 ENCOUNTER — Ambulatory Visit (INDEPENDENT_AMBULATORY_CARE_PROVIDER_SITE_OTHER): Admitting: Acute Care

## 2024-02-13 ENCOUNTER — Encounter: Payer: Self-pay | Admitting: Acute Care

## 2024-02-13 ENCOUNTER — Encounter: Payer: Self-pay | Admitting: Radiation Oncology

## 2024-02-13 VITALS — BP 103/67 | HR 100 | Ht 66.0 in | Wt 133.4 lb

## 2024-02-13 VITALS — BP 117/75 | HR 83 | Temp 97.8°F | Resp 20 | Ht 66.0 in | Wt 133.4 lb

## 2024-02-13 DIAGNOSIS — Z803 Family history of malignant neoplasm of breast: Secondary | ICD-10-CM | POA: Diagnosis not present

## 2024-02-13 DIAGNOSIS — J449 Chronic obstructive pulmonary disease, unspecified: Secondary | ICD-10-CM

## 2024-02-13 DIAGNOSIS — C3490 Malignant neoplasm of unspecified part of unspecified bronchus or lung: Secondary | ICD-10-CM | POA: Diagnosis not present

## 2024-02-13 DIAGNOSIS — Z87891 Personal history of nicotine dependence: Secondary | ICD-10-CM | POA: Diagnosis not present

## 2024-02-13 DIAGNOSIS — Z79899 Other long term (current) drug therapy: Secondary | ICD-10-CM | POA: Diagnosis not present

## 2024-02-13 DIAGNOSIS — Z7952 Long term (current) use of systemic steroids: Secondary | ICD-10-CM | POA: Diagnosis not present

## 2024-02-13 DIAGNOSIS — Z9289 Personal history of other medical treatment: Secondary | ICD-10-CM

## 2024-02-13 DIAGNOSIS — I2510 Atherosclerotic heart disease of native coronary artery without angina pectoris: Secondary | ICD-10-CM | POA: Insufficient documentation

## 2024-02-13 DIAGNOSIS — F17210 Nicotine dependence, cigarettes, uncomplicated: Secondary | ICD-10-CM | POA: Diagnosis not present

## 2024-02-13 DIAGNOSIS — K219 Gastro-esophageal reflux disease without esophagitis: Secondary | ICD-10-CM | POA: Insufficient documentation

## 2024-02-13 DIAGNOSIS — C3411 Malignant neoplasm of upper lobe, right bronchus or lung: Secondary | ICD-10-CM

## 2024-02-13 DIAGNOSIS — J4489 Other specified chronic obstructive pulmonary disease: Secondary | ICD-10-CM | POA: Diagnosis not present

## 2024-02-13 DIAGNOSIS — I700 Atherosclerosis of aorta: Secondary | ICD-10-CM | POA: Insufficient documentation

## 2024-02-13 DIAGNOSIS — Z801 Family history of malignant neoplasm of trachea, bronchus and lung: Secondary | ICD-10-CM | POA: Diagnosis not present

## 2024-02-13 DIAGNOSIS — R051 Acute cough: Secondary | ICD-10-CM

## 2024-02-13 DIAGNOSIS — Z800 Family history of malignant neoplasm of digestive organs: Secondary | ICD-10-CM | POA: Diagnosis not present

## 2024-02-13 DIAGNOSIS — F17200 Nicotine dependence, unspecified, uncomplicated: Secondary | ICD-10-CM

## 2024-02-13 MED ORDER — TRELEGY ELLIPTA 200-62.5-25 MCG/ACT IN AEPB: 1.0000 | INHALATION_SPRAY | Freq: Every day | RESPIRATORY_TRACT | Status: DC

## 2024-02-13 MED ORDER — GUAIFENESIN-CODEINE 100-10 MG/5ML PO SOLN: 5.0000 mL | ORAL | 0 refills | Status: DC | PRN

## 2024-02-13 MED ORDER — OHTUVAYRE 3 MG/2.5ML IN SUSP: 1.0000 | Freq: Two times a day (BID) | RESPIRATORY_TRACT | Status: DC

## 2024-02-13 NOTE — Progress Notes (Signed)
 History of Present Illness [name] is a 63 y.o. female current every day smoker originally referred to Dr. Baldwin Levee April 2025 for finding of a lung nodule during hospitalization.   Synopsis A lung mass was discovered during an emergency room visit for a suspected COPD flare. Imaging, including a CT scan and chest x-ray, revealed a mass in the right upper lobe of the lung. A PET scan was ordered and done 01/12/2024, and  indicated hypermetabolism in the mass, suggesting active growth.  The PET scan confirmed the presence of a hypermetabolic mass in the right upper lobe, measuring three millimeters. Other small areas of nodularity in the right upper lobe appeared smaller and less extensive than they had on the earlier CT Chest, and are suspected to be infectious or inflammatory, but bear close observation moving forward. .   She is currently on one and a half liters of oxygen. Despite efforts to quit, she is continuing to smoke.  She is using  nicotine  gum. She has a history of headaches, which she attributes to her COPD and neck issues, and frequently uses Goody powders for relief, which contain aspirin. She denies having sleep apnea, diabetes, or a latex allergy. She is not on blood thinners but has been advised to stop Goody powders before the biopsy due to their aspirin content.   Pt. Underwent bronchoscopy with biopsies on 01/23/2024.  Biopsy of the right upper lung was positive for squamous cell carcinoma.  At that time she was referred to radiation oncology for treatment.  With her current lung function patient was not a candidate for surgical options.  Patient had a recent hospitalization at [facility] from 02/05/2024 through 02/09/2024 for COPD exacerbation. Primary purpose of today's visit is for POC walk, and to ensure she is doing well at hospital follow-up.  02/13/2024 Pt. Presents for a POC walk. She was recently admitted to [facility] for a COPD exacerbation from 02/05/2024- 02/09/2024.  She had oxygen saturations as low as 80% on her 2 L [place]. pH was 7.21 and she was placed on BiAP . ABG improved to 7.36 and she was admitted. She was treated with oxygen, steroids, antibiotics and bronchodilators.  She was offered home BiPAP which she did not want to start at present time. She is working on quitting smoking, currently about 3-4 per day. She is compliant with her Trelegy. We have completed financial assistance paperwork. I will also give her 2 samples today. I will increase her to the 200 dose.   As patient has had 2 relatively recent COPD exacerbations we will add Ohtuvayre  nebs twice daily. Additionally we will increase her Trelegy Ellipta  to 200 mcg from the 100 dose. She does feel she benefits from the guaifenesin  with codeine  as a mucolytic primarily.  We will refill this x 1 as patient is also currently seen in the pain management clinic.  Patient did qualify for POC on 3 L.  Orders have been placed to her DME.  We will have patient follow-up in 2 months to ensure she is doing better and also to see if new nebulized medication is helpful.   Test Results: CXR 02/05/2024 Redemonstration of ill-defined opacity overlying the right mid lung zone, corresponding to patient's known right lung upper lobe mass. *There is new blunting of bilateral lateral costophrenic angles, which may be due to overlying lung parenchymal opacities versus pleural effusions.     Latest Ref Rng & Units 02/12/2024    2:01 PM 02/09/2024  5:01 AM 02/06/2024    2:55 AM  CBC  WBC 4.0 - 10.5 K/uL 14.2  8.3  24.5   Hemoglobin 12.0 - 15.0 g/dL 60.4  54.0  98.1   Hematocrit 36.0 - 46.0 % 40.4  34.2  38.0   Platelets 150 - 400 K/uL 363  203  222        Latest Ref Rng & Units 02/12/2024    2:01 PM 02/09/2024    5:01 AM 02/06/2024    2:55 AM  BMP  Glucose 70 - 99 mg/dL 191  90  478   BUN 8 - 23 mg/dL 14  20  16    Creatinine 0.44 - 1.00 mg/dL 2.95  6.21  3.08   Sodium 135 - 145 mmol/L 137  137  135    Potassium 3.5 - 5.1 mmol/L 4.6  4.0  4.5   Chloride 98 - 111 mmol/L 96  95  96   CO2 22 - 32 mmol/L 34  36  32   Calcium  8.9 - 10.3 mg/dL 9.8  8.7  8.7     BNP    Component Value Date/Time   BNP 186.8 (H) 02/05/2024 1448    ProBNP No results found for: "PROBNP"  PFT    Component Value Date/Time   FEV1PRE 1.28 06/07/2018 1610   FVCPRE 2.25 06/07/2018 1610   PREFEV1FVCRT 57 06/07/2018 1610    DG Chest Port 1 View Result Date: 02/05/2024 CLINICAL DATA:  Dyspnea. EXAM: PORTABLE CHEST 1 VIEW COMPARISON:  01/23/2024. FINDINGS: Redemonstration of ill-defined faint opacity overlying the right mid lung zone which corresponds to patient's known right lung upper lobe mass. There are additional nonspecific heterogeneous opacities throughout bilateral lungs with lower lobe predominance, which are also similar to the prior study. There is new blunting of bilateral lateral costophrenic angles, which may be due to overlying lung parenchymal opacities versus pleural effusions. No pneumothorax on either side. Stable cardio-mediastinal silhouette. No acute osseous abnormalities. Old healed bilateral posterior rib fractures noted. The soft tissues are within normal limits. IMPRESSION: *Redemonstration of ill-defined opacity overlying the right mid lung zone, corresponding to patient's known right lung upper lobe mass. *There is new blunting of bilateral lateral costophrenic angles, which may be due to overlying lung parenchymal opacities versus pleural effusions. Electronically Signed   By: Beula Brunswick M.D.   On: 02/05/2024 16:32   CT Super D Chest Wo Contrast Result Date: 01/23/2024 CLINICAL DATA:  Provided history "". History from EMR right upper lobe mass, status post bronchoscopy and tissue sampling." * Tracking Code: BO * EXAM: CT CHEST WITHOUT CONTRAST TECHNIQUE: Multidetector CT imaging of the chest was performed using thin slice collimation for electromagnetic bronchoscopy planning purposes,  without intravenous contrast. RADIATION DOSE REDUCTION: This exam was performed according to the departmental dose-optimization program which includes automated exposure control, adjustment of the mA and/or kV according to patient size and/or use of iterative reconstruction technique. COMPARISON:  PET-CT scan from 01/12/2024. FINDINGS: Cardiovascular: Normal cardiac size. No pericardial effusion. No aortic aneurysm. There are coronary artery calcifications, in keeping with coronary artery disease. There are also mild peripheral atherosclerotic vascular calcifications of thoracic aorta and its major branches. Mediastinum/Nodes: Visualized thyroid gland appears grossly unremarkable. No solid / cystic mediastinal masses. The esophagus is nondistended precluding optimal assessment. There are few mildly prominent mediastinal lymph nodes, which do not meet the size criteria for lymphadenopathy and appear grossly similar to the prior study. No axillary lymphadenopathy by size criteria. Evaluation of bilateral hila  is limited due to lack on intravenous contrast: however, no large hilar lymphadenopathy identified. Lungs/Pleura: The central tracheo-bronchial tree is patent. There are mild to moderate centrilobular emphysematous changes, with upper lobe predominance. Redemonstration of patient's known mass in the right lung upper lobe, posterior segment measuring 3.1 x 3.5 cm, slightly increased since the prior study when it measured up to 2.8 x 3.3 cm, when measured in similar fashion. There is new minimal opacity distally, likely due to post obstructive changes. Redemonstration of small grouping of tree-in-bud configuration nodules in the right lung lower lobe, which is unchanged and may represent sequela of prior infection or inflammation. No new mass or consolidation. No pleural effusion or pneumothorax. No suspicious lung nodules. Upper Abdomen: Scattered calcified granulomas noted in the liver. Remaining visualized upper  abdominal viscera within normal limits. Musculoskeletal: The visualized soft tissues of the chest wall are grossly unremarkable. No suspicious osseous lesions. There are mild multilevel degenerative changes in the visualized spine. Redemonstration of mild compression deformities of T4, T6 and T8 vertebrae, similar to the prior study from 12/24/2023. No significant retropulsion or spinal canal compromise. C4-C7 ACDF noted. IMPRESSION: 1. There is redemonstration of patient's known right lung upper lobe mass, which is slightly increased in size since the prior study. No new lung mass or consolidation. No new lymphadenopathy. 2. Multiple other nonacute observations, as described above. Aortic Atherosclerosis (ICD10-I70.0) and Emphysema (ICD10-J43.9). Electronically Signed   By: Beula Brunswick M.D.   On: 01/23/2024 10:36   DG Chest Port 1 View Result Date: 01/23/2024 CLINICAL DATA:  [id number] S/P bronchoscopy with biopsy [id number] EXAM: PORTABLE CHEST 1 VIEW COMPARISON:  12/24/2023. FINDINGS: Redemonstration of a well-circumscribed 3.1 x 3.6 cm mass overlying the right mid lung zone, which appears enlarged in the interim. Bilateral lung fields are otherwise clear. Bilateral costophrenic angles are clear. No pneumothorax. Normal cardio-mediastinal silhouette. No acute osseous abnormalities. Redemonstration of multiple old left upper posteromedial rib fractures. Lower cervical spinal fixation hardware noted. The soft tissues are within normal limits. IMPRESSION: Redemonstration of a well-circumscribed 3.1 x 3.6 cm mass overlying the right mid lung zone, which appears enlarged in the interim. No pneumothorax. Electronically Signed   By: Beula Brunswick M.D.   On: 01/23/2024 10:25   DG C-ARM BRONCHOSCOPY Result Date: 01/23/2024 C-ARM BRONCHOSCOPY: Fluoroscopy was utilized by the requesting physician.  No radiographic interpretation.     Past medical hx Past Medical History:  Diagnosis Date   Arthritis    cervical  spine   ASTHMA UNSPECIFIED WITH EXACERBATION 07/21/2010   CARPAL TUNNEL SYNDROME, BILATERAL 01/16/2008   COPD 05/08/2008   Dyspnea    intermittent. worsens with anxiety   GERD 04/18/2007   HELICOBACTER PYLORI GASTRITIS, HX OF 04/18/2007   PANIC DISORDER 04/18/2007   Pneumonia    yrs ago   TOBACCO ABUSE 07/17/2009     Social History   Tobacco Use   Smoking status: Every Day    Current packs/day: 0.75    Average packs/day: 1.5 packs/day for 50.3 years (74.5 ttl pk-yrs)    Types: Cigarettes    Start date: 1975   Smokeless tobacco: Never   Tobacco comments:    Trying nicotine  patches, made her nauseous at first, going to cut patches in half.    1 cigarette a day-01/17/2024  Vaping Use   Vaping status: Never Used  Substance Use Topics   Alcohol use: Yes    Comment: occasional   Drug use: No    Ms.Grogan reports that she has  been smoking cigarettes. She started smoking about 50 years ago. She has a 74.5 pack-year smoking history. She has never used smokeless tobacco. She reports current alcohol use. She reports that she does not use drugs.  Tobacco Cessation: Ready to quit: Not Answered Counseling given: Not Answered Tobacco comments: Trying nicotine  patches, made her nauseous at first, going to cut patches in half. 1 cigarette a day-01/17/2024 Current everyday smoker working very hard to quit Counseled for 3 to 4 minutes at today's office visit on cessation  Past surgical hx, Family hx, Social hx all reviewed.  Current Outpatient Medications on File Prior to Visit  Medication Sig   acetaminophen  (TYLENOL ) 500 MG tablet Take 1,000 mg by mouth every 8 (eight) hours.   albuterol  (VENTOLIN  HFA) 108 (90 Base) MCG/ACT inhaler INHALE 2 PUFFS INTO THE LUNGS EVERY 4 HOURS AS NEEDED FOR WHEEZING OR SHORTNESS OF BREATH.   ALPRAZolam  (XANAX ) 1 MG tablet Take 1 mg by mouth 3 (three) times daily.   cyclobenzaprine  (FLEXERIL ) 10 MG tablet Take 10 mg by mouth 2 (two) times daily as needed  for muscle spasms.   Fluticasone -Umeclidin-Vilant (TRELEGY ELLIPTA ) 100-62.5-25 MCG/ACT AEPB Inhale 1 puff into the lungs daily at 2 PM.   guaiFENesin -codeine  (GUAIATUSSIN AC) 100-10 MG/5ML syrup Take 5 mLs by mouth every 4 (four) hours as needed for cough or to loosen phlegm.   ipratropium-albuterol  (DUONEB) 0.5-2.5 (3) MG/3ML SOLN Take 3 mLs by nebulization every 6 (six) hours as needed. (Patient taking differently: Take 3 mLs by nebulization every 6 (six) hours as needed (Wheezing/SOB).)   nicotine  (NICODERM CQ  - DOSED IN MG/24 HOURS) 21 mg/24hr patch Place 1 patch (21 mg total) onto the skin daily.   Oxycodone  HCl 10 MG TABS Take 10 mg by mouth in the morning and at bedtime.   pantoprazole  (PROTONIX ) 40 MG tablet Take 1 tablet (40 mg total) by mouth at bedtime.   predniSONE  (DELTASONE ) 10 MG tablet Take 4 tablets (40 mg total) daily for 3 days, THEN 3 tablets (30 mg total) daily for 3 days, THEN 2 tablets (20 mg total) daily for 3 days, THEN 1 tablet (10 mg total) daily for 3 days.  Then stop   No current facility-administered medications on file prior to visit.     Allergies  Allergen Reactions   Aspirin Other (See Comments)    REACTION: had h. pylori due to too many goody powders. told not to take any more asa    Review Of Systems:  Constitutional:   + weight loss, night sweats,  Fevers, chills, +fatigue, or  lassitude.  HEENT:   No headaches,  Difficulty swallowing,  Tooth/dental problems, or  Sore throat,                No sneezing, itching, ear ache, nasal congestion, post nasal drip,   CV:  No chest pain,  Orthopnea, PND, swelling in lower extremities, anasarca, dizziness, palpitations, syncope.   GI  No heartburn, indigestion, abdominal pain, nausea, vomiting, diarrhea, change in bowel habits, + loss of appetite, bloody stools.   Resp: + shortness of breath with exertion less at rest.  + baseline excess mucus, + baseline productive cough,  + baseline non-productive cough,  No  coughing up of blood.  No change in color of mucus.  + Occasional wheezing.  No chest wall deformity  Skin: no rash or lesions.  GU: no dysuria, change in color of urine, no urgency or frequency.  No flank pain, no hematuria  MS:  No joint pain or swelling.  + decreased range of motion.  No back pain.  Psych:  No change in mood or affect. No depression or anxiety.  No memory loss.   Vital Signs BP 103/67 (BP Location: Left Arm, Patient Position: Sitting, Cuff Size: Normal)   Pulse 100   Ht 5\' 6"  (1.676 m)   Wt 133 lb 6.4 oz (60.5 kg)   SpO2 94%   BMI 21.53 kg/m    Physical Exam:  General- No distress,  A&Ox3, pleasant, frail appearing in wheelchair ENT: No sinus tenderness, TM clear, pale nasal mucosa, no oral exudate,no post nasal drip, no LAN Cardiac: S1, S2, regular rate and rhythm, no murmur Chest: No wheeze/ rales/ dullness; no accessory muscle use, no nasal flaring, no sternal retractions, diminished to bases, few wheezing and rhonchi Abd.: Soft Non-tender, nondistended, bowel sounds positive,Body mass index is 21.53 kg/m.  Ext: No clubbing cyanosis, edema, no obvious deformities Neuro: Physical deconditioning, alert and oriented x 3, moving all extremities x 4, Skin: No rashes, warm and dry, no obvious skin lesions Psych: normal mood and behavior   Assessment/Plan Recent hospitalization for COPD flare Walk for POC oxygen device New diagnosis non-small cell lung cancer, plan for radiation treatment Plan I am glad you are feeling better after being in the hospital.  I have sent in a prescription for : guaiFENesin -codeine  (GUAIATUSSIN AC) 100-10 MG/5ML syrup 120 mL 0 02/13/2024 --   Sig - Route: Take 5 mLs by mouth every 4 (four) hours as needed for cough. Take 5 mLs by mouth every 4 (four) hours as needed for cough or to loosen phlegm   Do not drive when sleepy. Do not take with your percocet. Continue Trelegy and Albuterol  as you have been doing. We will walk you  today on  the POC to see if you qualify for the device. If you do, we will place an order.  Follow up in 2 months with Isa Manuel NP or Dr. Baldwin Levee to make sure your COPD is getting better.  Note your daily symptoms > remember "red flags" for COPD:  Increase in cough, increase in sputum production, increase in shortness of breath or activity intolerance. If you notice these symptoms, please call to be seen.    Please call if you have another flare.  I have sent in a prescription for ohtuvayre . This is a neb treatment that you use twice a day. This may not be covered by your insurance  Please contact office for sooner follow up if symptoms do not improve or worsen or seek emergency care    I spent 40 minutes dedicated to the care of this patient on the date of this encounter to include pre-visit review of records, face-to-face time with the patient discussing conditions above, post visit ordering of testing, clinical documentation with the electronic health record, making appropriate referrals as documented, and communicating necessary information to the patient's healthcare team.   [name], NP 02/13/2024  3:12 PM

## 2024-02-13 NOTE — Patient Instructions (Addendum)
 It is good to see you today. I am glad you are feeling better after being in the hospital.  I have sent in a prescription for : guaiFENesin -codeine  (GUAIATUSSIN AC) 100-10 MG/5ML syrup 120 mL 0 02/13/2024 --   Sig - Route: Take 5 mLs by mouth every 4 (four) hours as needed for cough. Take 5 mLs by mouth every 4 (four) hours as needed for cough or to loosen phlegm   Do not drive when sleepy. Do not take with your percocet. Continue Trelegy and Albuterol  as you have been doing. We will walk you today on  the POC to see if you qualify for the device. If you do, we will place an order.  Follow up in 2 months with Isa Manuel NP or Dr. Baldwin Levee to make sure your COPD is getting better.  Note your daily symptoms > remember "red flags" for COPD:  Increase in cough, increase in sputum production, increase in shortness of breath or activity intolerance. If you notice these symptoms, please call to be seen.    Please call if you have another flare.  I have sent in a prescription for ohtuvayre . This is a neb treatment that you use twice a day. This may not be covered by your insurance  Please contact office for sooner follow up if symptoms do not improve or worsen or seek emergency care

## 2024-02-13 NOTE — Progress Notes (Signed)
 Thoracic Location of Tumor / Histology: Right Upper Lobe Lung  Patient presented to the ER with complaints of shortness of breath in March 2025.  Imaging obtained showed a perihilar mass in the right upper lobe measuring 3.8 cm, a 7 mm right upper lobe nodule, a 1.6 cm subpleural nodule in the anterior aspect of the right upper lobe as well as a 1.3 cm right hilar lymph node with 9 mm right paratracheal lymph node.    Bronchoscopy 01/23/2024   PET 01/12/2024:    Biopsies of Right Upper Lobe Lung Nodule 01/23/2024   Past/Anticipated interventions by cardiothoracic surgery, if any:    Past/Anticipated interventions by medical oncology, if any:  Dr. [name] 02/12/2024 -She is not a good surgical candidate secondary to significant COPD and poor pulmonary function.  -Radiation therapy is recommended as the primary treatment modality.  -Chemotherapy or immunotherapy may be considered if there is future tumor growth    Tobacco/Marijuana/Snuff/ETOH use: Current, working on quitting.  Signs/Symptoms Weight changes, if any: Denies weight loss over the past month. Respiratory complaints, if any: Occasional SOB with increased activities.  Wearing 2 liters oxygen, wears 1.5 at home. Hemoptysis, if any: Productive with clear/yellowish phlegm, taking guaifenesin -codeine . Pain issues, if any:  Denies chest pain, pressure, or tightness.  SAFETY ISSUES: Prior radiation? No Pacemaker/ICD? No  Possible current pregnancy? Postmenopausal Is the patient on methotrexate? No  Current Complaints / other details:

## 2024-02-14 DIAGNOSIS — C3411 Malignant neoplasm of upper lobe, right bronchus or lung: Secondary | ICD-10-CM | POA: Diagnosis not present

## 2024-02-14 DIAGNOSIS — F410 Panic disorder [episodic paroxysmal anxiety] without agoraphobia: Secondary | ICD-10-CM | POA: Diagnosis not present

## 2024-02-14 DIAGNOSIS — F32A Depression, unspecified: Secondary | ICD-10-CM | POA: Diagnosis not present

## 2024-02-14 DIAGNOSIS — Z87891 Personal history of nicotine dependence: Secondary | ICD-10-CM | POA: Diagnosis not present

## 2024-02-14 DIAGNOSIS — F411 Generalized anxiety disorder: Secondary | ICD-10-CM | POA: Diagnosis not present

## 2024-02-15 ENCOUNTER — Other Ambulatory Visit (HOSPITAL_COMMUNITY): Payer: Self-pay

## 2024-02-15 ENCOUNTER — Telehealth: Payer: Self-pay

## 2024-02-15 ENCOUNTER — Telehealth: Payer: Self-pay | Admitting: *Deleted

## 2024-02-15 NOTE — Telephone Encounter (Signed)
 Copied from CRM [id number]. Topic: Clinical - Medical Advice >> Feb 15, 2024  3:35 PM Allyne Areola wrote: Reason for CRM: Liji from [facility] home health is calling because patient is refusing at home health physical therapy. Best call back number is [phone].

## 2024-02-15 NOTE — Telephone Encounter (Signed)
 Received Ohtuvayre new start paperwork. Completed form and faxed with clinicals and insurance card copy to [facility]   Phone#: [phone] Fax#: [phone]

## 2024-02-21 ENCOUNTER — Ambulatory Visit
Admission: RE | Admit: 2024-02-21 | Discharge: 2024-02-21 | Disposition: A | Discharge status: Home / Self Care | Source: Ambulatory Visit | Attending: Radiation Oncology | Admitting: Radiation Oncology

## 2024-02-21 DIAGNOSIS — Z510 Encounter for antineoplastic radiation therapy: Secondary | ICD-10-CM | POA: Diagnosis not present

## 2024-02-21 DIAGNOSIS — C3411 Malignant neoplasm of upper lobe, right bronchus or lung: Secondary | ICD-10-CM | POA: Insufficient documentation

## 2024-02-21 DIAGNOSIS — F17210 Nicotine dependence, cigarettes, uncomplicated: Secondary | ICD-10-CM | POA: Diagnosis not present

## 2024-02-21 DIAGNOSIS — Z87891 Personal history of nicotine dependence: Secondary | ICD-10-CM | POA: Diagnosis not present

## 2024-02-22 DIAGNOSIS — M5459 Other low back pain: Secondary | ICD-10-CM | POA: Diagnosis not present

## 2024-02-22 DIAGNOSIS — M542 Cervicalgia: Secondary | ICD-10-CM | POA: Diagnosis not present

## 2024-02-22 DIAGNOSIS — Z79891 Long term (current) use of opiate analgesic: Secondary | ICD-10-CM | POA: Diagnosis not present

## 2024-02-22 DIAGNOSIS — G894 Chronic pain syndrome: Secondary | ICD-10-CM | POA: Diagnosis not present

## 2024-02-22 DIAGNOSIS — M961 Postlaminectomy syndrome, not elsewhere classified: Secondary | ICD-10-CM | POA: Diagnosis not present

## 2024-02-28 ENCOUNTER — Telehealth: Payer: Self-pay

## 2024-02-28 DIAGNOSIS — Z510 Encounter for antineoplastic radiation therapy: Secondary | ICD-10-CM | POA: Diagnosis not present

## 2024-02-28 DIAGNOSIS — J441 Chronic obstructive pulmonary disease with (acute) exacerbation: Secondary | ICD-10-CM

## 2024-02-28 DIAGNOSIS — F17210 Nicotine dependence, cigarettes, uncomplicated: Secondary | ICD-10-CM | POA: Diagnosis not present

## 2024-02-28 DIAGNOSIS — Z87891 Personal history of nicotine dependence: Secondary | ICD-10-CM | POA: Diagnosis not present

## 2024-02-28 DIAGNOSIS — C3411 Malignant neoplasm of upper lobe, right bronchus or lung: Secondary | ICD-10-CM | POA: Diagnosis not present

## 2024-02-28 NOTE — Telephone Encounter (Signed)
 Copied from CRM [id number]. Topic: Clinical - Order For Equipment >> Feb 28, 2024 11:22 AM Alyse July wrote: Reason for CRM: Patient would like an order placed for [employer] to deliver her oxygen to her home. Patient current has [employer]ex and the device is currently not working properly and the company doesn't sent anyone out for equipment maintenance.

## 2024-02-29 NOTE — Addendum Note (Signed)
 Addended by: Aurelio Leer on: 02/29/2024 03:28 PM   Modules accepted: Orders

## 2024-02-29 NOTE — Addendum Note (Signed)
 Addended by: Aurelio Leer on: 02/29/2024 02:49 PM   Modules accepted: Orders

## 2024-02-29 NOTE — Telephone Encounter (Signed)
 Order placed and community message via Epic

## 2024-03-05 ENCOUNTER — Other Ambulatory Visit: Payer: Self-pay

## 2024-03-05 ENCOUNTER — Telehealth: Payer: Self-pay

## 2024-03-05 ENCOUNTER — Ambulatory Visit
Admission: RE | Admit: 2024-03-05 | Discharge: 2024-03-05 | Disposition: A | Discharge status: Home / Self Care | Source: Ambulatory Visit | Attending: Radiation Oncology | Admitting: Radiation Oncology

## 2024-03-05 DIAGNOSIS — C3411 Malignant neoplasm of upper lobe, right bronchus or lung: Secondary | ICD-10-CM | POA: Diagnosis not present

## 2024-03-05 DIAGNOSIS — F17210 Nicotine dependence, cigarettes, uncomplicated: Secondary | ICD-10-CM | POA: Diagnosis not present

## 2024-03-05 DIAGNOSIS — Z510 Encounter for antineoplastic radiation therapy: Secondary | ICD-10-CM | POA: Diagnosis not present

## 2024-03-05 DIAGNOSIS — Z87891 Personal history of nicotine dependence: Secondary | ICD-10-CM | POA: Diagnosis not present

## 2024-03-05 LAB — RAD ONC ARIA SESSION SUMMARY
Course Elapsed Days: 0
Plan Fractions Treated to Date: 1
Plan Prescribed Dose Per Fraction: 6 Gy
Plan Total Fractions Prescribed: 10
Plan Total Prescribed Dose: 60 Gy
Reference Point Dosage Given to Date: 6 Gy
Reference Point Session Dosage Given: 6 Gy
Session Number: 1

## 2024-03-05 NOTE — Telephone Encounter (Signed)
 Left a message for the patient to return my call in regards to inform her of Message from [employer] in regard to Oxygen. Per Shirline Dover with [employer]: "This pt is currently on service with [employer]. She has been with [employer] since it looks like February of 2023.She would have to wait till the 5 year recertification to switch DME providers due billing of insurance. "

## 2024-03-05 NOTE — Telephone Encounter (Signed)
 Copied from CRM [id number]. Topic: General - Other >> Mar 05, 2024 11:22 AM Albertha Alosa wrote: Reason for CRM: Patient called in regarding [employer] stated , she spoke with them and they stated Dr.Burchette sent the order in for a Bpack and not oxygen she needs an oxygen tank, and also stated she needs a portable oxygen that she can wear with her

## 2024-03-05 NOTE — Telephone Encounter (Signed)
 Patient informed of the message below and another community message was sent to [employer] for clarification on DME oxygen

## 2024-03-06 ENCOUNTER — Telehealth: Payer: Self-pay

## 2024-03-06 ENCOUNTER — Ambulatory Visit
Admission: RE | Admit: 2024-03-06 | Discharge: 2024-03-06 | Disposition: A | Discharge status: Home / Self Care | Source: Ambulatory Visit | Attending: Radiation Oncology

## 2024-03-06 ENCOUNTER — Other Ambulatory Visit: Payer: Self-pay

## 2024-03-06 DIAGNOSIS — Z87891 Personal history of nicotine dependence: Secondary | ICD-10-CM | POA: Diagnosis not present

## 2024-03-06 DIAGNOSIS — Z510 Encounter for antineoplastic radiation therapy: Secondary | ICD-10-CM | POA: Diagnosis not present

## 2024-03-06 DIAGNOSIS — C3411 Malignant neoplasm of upper lobe, right bronchus or lung: Secondary | ICD-10-CM | POA: Diagnosis not present

## 2024-03-06 DIAGNOSIS — F17210 Nicotine dependence, cigarettes, uncomplicated: Secondary | ICD-10-CM | POA: Diagnosis not present

## 2024-03-06 LAB — RAD ONC ARIA SESSION SUMMARY
Course Elapsed Days: 1
Plan Fractions Treated to Date: 2
Plan Prescribed Dose Per Fraction: 6 Gy
Plan Total Fractions Prescribed: 10
Plan Total Prescribed Dose: 60 Gy
Reference Point Dosage Given to Date: 12 Gy
Reference Point Session Dosage Given: 6 Gy
Session Number: 2

## 2024-03-06 NOTE — Telephone Encounter (Signed)
 Copied from CRM [id number]. Topic: Clinical - Order For Equipment >> Mar 06, 2024 10:24 AM [name] wrote: Reason for CRM:  Patient needs new prescription for oxygen (at home), clinical notes, and testing results faxed to [facility] [phone]  Patient would like a call back from nurse to discuss a portable oxygen machine that she can take with her to the grocery store, etc. If possible, a call back by 2pm as she has radiation appt. today at 2:30p.   Spoke to lisa at [employer] and everything is in order and they have the order for the poc and lisa from [employer] is going to reach out to the patient,but everything on our end is done,NFN

## 2024-03-07 ENCOUNTER — Ambulatory Visit
Admission: RE | Admit: 2024-03-07 | Discharge: 2024-03-07 | Disposition: A | Discharge status: Home / Self Care | Source: Ambulatory Visit | Attending: Radiation Oncology | Admitting: Radiation Oncology

## 2024-03-07 ENCOUNTER — Other Ambulatory Visit: Payer: Self-pay

## 2024-03-07 DIAGNOSIS — Z87891 Personal history of nicotine dependence: Secondary | ICD-10-CM | POA: Diagnosis not present

## 2024-03-07 DIAGNOSIS — F17210 Nicotine dependence, cigarettes, uncomplicated: Secondary | ICD-10-CM | POA: Diagnosis not present

## 2024-03-07 DIAGNOSIS — Z510 Encounter for antineoplastic radiation therapy: Secondary | ICD-10-CM | POA: Diagnosis not present

## 2024-03-07 DIAGNOSIS — C3411 Malignant neoplasm of upper lobe, right bronchus or lung: Secondary | ICD-10-CM | POA: Diagnosis not present

## 2024-03-07 LAB — RAD ONC ARIA SESSION SUMMARY
Course Elapsed Days: 2
Plan Fractions Treated to Date: 3
Plan Prescribed Dose Per Fraction: 6 Gy
Plan Total Fractions Prescribed: 10
Plan Total Prescribed Dose: 60 Gy
Reference Point Dosage Given to Date: 18 Gy
Reference Point Session Dosage Given: 6 Gy
Session Number: 3

## 2024-03-08 ENCOUNTER — Other Ambulatory Visit: Payer: Self-pay

## 2024-03-08 ENCOUNTER — Ambulatory Visit
Admission: RE | Admit: 2024-03-08 | Discharge: 2024-03-08 | Disposition: A | Discharge status: Home / Self Care | Source: Ambulatory Visit | Attending: Radiation Oncology | Admitting: Radiation Oncology

## 2024-03-08 DIAGNOSIS — Z87891 Personal history of nicotine dependence: Secondary | ICD-10-CM | POA: Diagnosis not present

## 2024-03-08 DIAGNOSIS — F17210 Nicotine dependence, cigarettes, uncomplicated: Secondary | ICD-10-CM | POA: Diagnosis not present

## 2024-03-08 DIAGNOSIS — C3411 Malignant neoplasm of upper lobe, right bronchus or lung: Secondary | ICD-10-CM | POA: Diagnosis not present

## 2024-03-08 DIAGNOSIS — Z510 Encounter for antineoplastic radiation therapy: Secondary | ICD-10-CM | POA: Diagnosis not present

## 2024-03-08 LAB — RAD ONC ARIA SESSION SUMMARY
Course Elapsed Days: 3
Plan Fractions Treated to Date: 4
Plan Prescribed Dose Per Fraction: 6 Gy
Plan Total Fractions Prescribed: 10
Plan Total Prescribed Dose: 60 Gy
Reference Point Dosage Given to Date: 24 Gy
Reference Point Session Dosage Given: 6 Gy
Session Number: 4

## 2024-03-11 ENCOUNTER — Other Ambulatory Visit: Payer: Self-pay

## 2024-03-11 ENCOUNTER — Telehealth: Payer: Self-pay

## 2024-03-11 ENCOUNTER — Telehealth: Payer: Self-pay | Admitting: *Deleted

## 2024-03-11 ENCOUNTER — Ambulatory Visit
Admission: RE | Admit: 2024-03-11 | Discharge: 2024-03-11 | Disposition: A | Discharge status: Home / Self Care | Source: Ambulatory Visit | Attending: Radiation Oncology | Admitting: Radiation Oncology

## 2024-03-11 DIAGNOSIS — Z510 Encounter for antineoplastic radiation therapy: Secondary | ICD-10-CM | POA: Insufficient documentation

## 2024-03-11 DIAGNOSIS — C3411 Malignant neoplasm of upper lobe, right bronchus or lung: Secondary | ICD-10-CM | POA: Diagnosis not present

## 2024-03-11 DIAGNOSIS — F17210 Nicotine dependence, cigarettes, uncomplicated: Secondary | ICD-10-CM | POA: Diagnosis not present

## 2024-03-11 DIAGNOSIS — T1490XA Injury, unspecified, initial encounter: Secondary | ICD-10-CM | POA: Diagnosis not present

## 2024-03-11 DIAGNOSIS — Z87891 Personal history of nicotine dependence: Secondary | ICD-10-CM | POA: Diagnosis not present

## 2024-03-11 LAB — RAD ONC ARIA SESSION SUMMARY
Course Elapsed Days: 6
Plan Fractions Treated to Date: 5
Plan Prescribed Dose Per Fraction: 6 Gy
Plan Total Fractions Prescribed: 10
Plan Total Prescribed Dose: 60 Gy
Reference Point Dosage Given to Date: 30 Gy
Reference Point Session Dosage Given: 6 Gy
Session Number: 5

## 2024-03-11 NOTE — Telephone Encounter (Signed)
 I spoke with the patient in regards to O2 supply and patient reported she received O2 from [employer] and she is not in need of any assistance currently. Nothing further needed

## 2024-03-11 NOTE — Telephone Encounter (Signed)
 Copied from CRM [id number]. Topic: General - Call Back - No Documentation >> Mar 11, 2024 11:11 AM [name] wrote: Reason for CRM: Patient returning a call back from the office, there is no documentation of who called but patient stated that there was a voicemail left.

## 2024-03-11 NOTE — Telephone Encounter (Signed)
 Copied from CRM [id number]. Topic: Clinical - Order For Equipment >> Mar 11, 2024 10:41 AM [name] wrote: Reason for CRM:   Pt is requesting machine that removed CO2 (BiPAP)hat she utilized in the hospital with her last admission for COPD exacerbation. She found the machine to be beneficial and is now willing to use it  Requesting call back  # [phone] >> Mar 11, 2024 11:11 AM [name] wrote: Patient returning a call back from the office, there is no documentation of who called but patient stated that there was a voicemail left.    Please advise [name] pt is requesting to start on Bipap

## 2024-03-11 NOTE — Telephone Encounter (Signed)
 Patient is scheduled

## 2024-03-12 ENCOUNTER — Ambulatory Visit
Admission: RE | Admit: 2024-03-12 | Discharge: 2024-03-12 | Disposition: A | Discharge status: Home / Self Care | Source: Ambulatory Visit | Attending: Radiation Oncology | Admitting: Radiation Oncology

## 2024-03-12 ENCOUNTER — Other Ambulatory Visit: Payer: Self-pay

## 2024-03-12 DIAGNOSIS — Z87891 Personal history of nicotine dependence: Secondary | ICD-10-CM | POA: Diagnosis not present

## 2024-03-12 DIAGNOSIS — F17210 Nicotine dependence, cigarettes, uncomplicated: Secondary | ICD-10-CM | POA: Diagnosis not present

## 2024-03-12 DIAGNOSIS — C3411 Malignant neoplasm of upper lobe, right bronchus or lung: Secondary | ICD-10-CM | POA: Diagnosis not present

## 2024-03-12 DIAGNOSIS — Z510 Encounter for antineoplastic radiation therapy: Secondary | ICD-10-CM | POA: Diagnosis not present

## 2024-03-12 LAB — RAD ONC ARIA SESSION SUMMARY
Course Elapsed Days: 7
Plan Fractions Treated to Date: 6
Plan Prescribed Dose Per Fraction: 6 Gy
Plan Total Fractions Prescribed: 10
Plan Total Prescribed Dose: 60 Gy
Reference Point Dosage Given to Date: 36 Gy
Reference Point Session Dosage Given: 6 Gy
Session Number: 6

## 2024-03-13 ENCOUNTER — Other Ambulatory Visit: Payer: Self-pay | Admitting: Family Medicine

## 2024-03-13 ENCOUNTER — Telehealth: Payer: Self-pay | Admitting: *Deleted

## 2024-03-13 ENCOUNTER — Ambulatory Visit
Admission: RE | Admit: 2024-03-13 | Discharge: 2024-03-13 | Disposition: A | Discharge status: Home / Self Care | Source: Ambulatory Visit | Attending: Radiation Oncology

## 2024-03-13 ENCOUNTER — Other Ambulatory Visit: Payer: Self-pay

## 2024-03-13 DIAGNOSIS — C3411 Malignant neoplasm of upper lobe, right bronchus or lung: Secondary | ICD-10-CM | POA: Diagnosis not present

## 2024-03-13 DIAGNOSIS — Z87891 Personal history of nicotine dependence: Secondary | ICD-10-CM | POA: Diagnosis not present

## 2024-03-13 DIAGNOSIS — Z510 Encounter for antineoplastic radiation therapy: Secondary | ICD-10-CM | POA: Diagnosis not present

## 2024-03-13 DIAGNOSIS — J441 Chronic obstructive pulmonary disease with (acute) exacerbation: Secondary | ICD-10-CM

## 2024-03-13 DIAGNOSIS — F32A Depression, unspecified: Secondary | ICD-10-CM | POA: Diagnosis not present

## 2024-03-13 DIAGNOSIS — F410 Panic disorder [episodic paroxysmal anxiety] without agoraphobia: Secondary | ICD-10-CM | POA: Diagnosis not present

## 2024-03-13 DIAGNOSIS — F411 Generalized anxiety disorder: Secondary | ICD-10-CM | POA: Diagnosis not present

## 2024-03-13 DIAGNOSIS — F17210 Nicotine dependence, cigarettes, uncomplicated: Secondary | ICD-10-CM | POA: Diagnosis not present

## 2024-03-13 LAB — RAD ONC ARIA SESSION SUMMARY
Course Elapsed Days: 8
Plan Fractions Treated to Date: 7
Plan Prescribed Dose Per Fraction: 6 Gy
Plan Total Fractions Prescribed: 10
Plan Total Prescribed Dose: 60 Gy
Reference Point Dosage Given to Date: 42 Gy
Reference Point Session Dosage Given: 6 Gy
Session Number: 7

## 2024-03-13 NOTE — Telephone Encounter (Signed)
 Copied from CRM [id number]. Topic: Clinical - Order For Equipment >> Mar 08, 2024 10:48 AM [name] wrote: Reason for CRM: Patient called to request a portable oxygen tank she stated she cannot push around a big oxygen tank. Patient stated she has the tall cylinders, she needs the portable oxygen.  Called and spoke with patient, advised that she qualified for a POC when she was in the office on 02/13/2024 and an order was sent to [facility].  I let her know that once insurance approves it, [employer] will call her regarding the POC.  She has an OV on 6/17 with Dr. Dione Franks.  She verbalized understanding.  Nothing further needed.

## 2024-03-14 ENCOUNTER — Other Ambulatory Visit: Payer: Self-pay

## 2024-03-14 ENCOUNTER — Ambulatory Visit
Admission: RE | Admit: 2024-03-14 | Discharge: 2024-03-14 | Disposition: A | Discharge status: Home / Self Care | Source: Ambulatory Visit | Attending: Radiation Oncology | Admitting: Radiation Oncology

## 2024-03-14 DIAGNOSIS — F17210 Nicotine dependence, cigarettes, uncomplicated: Secondary | ICD-10-CM | POA: Diagnosis not present

## 2024-03-14 DIAGNOSIS — C3411 Malignant neoplasm of upper lobe, right bronchus or lung: Secondary | ICD-10-CM | POA: Diagnosis not present

## 2024-03-14 DIAGNOSIS — Z510 Encounter for antineoplastic radiation therapy: Secondary | ICD-10-CM | POA: Diagnosis not present

## 2024-03-14 DIAGNOSIS — Z87891 Personal history of nicotine dependence: Secondary | ICD-10-CM | POA: Diagnosis not present

## 2024-03-14 LAB — RAD ONC ARIA SESSION SUMMARY
Course Elapsed Days: 9
Plan Fractions Treated to Date: 8
Plan Prescribed Dose Per Fraction: 6 Gy
Plan Total Fractions Prescribed: 10
Plan Total Prescribed Dose: 60 Gy
Reference Point Dosage Given to Date: 48 Gy
Reference Point Session Dosage Given: 6 Gy
Session Number: 8

## 2024-03-15 ENCOUNTER — Ambulatory Visit

## 2024-03-15 ENCOUNTER — Ambulatory Visit
Admission: RE | Admit: 2024-03-15 | Discharge: 2024-03-15 | Disposition: A | Discharge status: Home / Self Care | Source: Ambulatory Visit | Attending: Radiation Oncology | Admitting: Radiation Oncology

## 2024-03-15 ENCOUNTER — Other Ambulatory Visit: Payer: Self-pay

## 2024-03-15 DIAGNOSIS — Z87891 Personal history of nicotine dependence: Secondary | ICD-10-CM | POA: Diagnosis not present

## 2024-03-15 DIAGNOSIS — Z510 Encounter for antineoplastic radiation therapy: Secondary | ICD-10-CM | POA: Diagnosis not present

## 2024-03-15 DIAGNOSIS — C3411 Malignant neoplasm of upper lobe, right bronchus or lung: Secondary | ICD-10-CM | POA: Diagnosis not present

## 2024-03-15 DIAGNOSIS — F17210 Nicotine dependence, cigarettes, uncomplicated: Secondary | ICD-10-CM | POA: Diagnosis not present

## 2024-03-15 LAB — RAD ONC ARIA SESSION SUMMARY
Course Elapsed Days: 10
Plan Fractions Treated to Date: 9
Plan Prescribed Dose Per Fraction: 6 Gy
Plan Total Fractions Prescribed: 10
Plan Total Prescribed Dose: 60 Gy
Reference Point Dosage Given to Date: 54 Gy
Reference Point Session Dosage Given: 6 Gy
Session Number: 9

## 2024-03-18 ENCOUNTER — Other Ambulatory Visit: Payer: Self-pay

## 2024-03-18 ENCOUNTER — Ambulatory Visit
Admission: RE | Admit: 2024-03-18 | Discharge: 2024-03-18 | Disposition: A | Discharge status: Home / Self Care | Source: Ambulatory Visit | Attending: Radiation Oncology | Admitting: Radiation Oncology

## 2024-03-18 DIAGNOSIS — Z510 Encounter for antineoplastic radiation therapy: Secondary | ICD-10-CM | POA: Diagnosis not present

## 2024-03-18 DIAGNOSIS — Z87891 Personal history of nicotine dependence: Secondary | ICD-10-CM | POA: Diagnosis not present

## 2024-03-18 DIAGNOSIS — F17210 Nicotine dependence, cigarettes, uncomplicated: Secondary | ICD-10-CM | POA: Diagnosis not present

## 2024-03-18 DIAGNOSIS — C3411 Malignant neoplasm of upper lobe, right bronchus or lung: Secondary | ICD-10-CM | POA: Diagnosis not present

## 2024-03-18 LAB — RAD ONC ARIA SESSION SUMMARY
Course Elapsed Days: 13
Plan Fractions Treated to Date: 10
Plan Prescribed Dose Per Fraction: 6 Gy
Plan Total Fractions Prescribed: 10
Plan Total Prescribed Dose: 60 Gy
Reference Point Dosage Given to Date: 60 Gy
Reference Point Session Dosage Given: 6 Gy
Session Number: 10

## 2024-03-19 NOTE — Telephone Encounter (Signed)
 Called [employer] and spoke with Chelsie to find out what the issue is with the POC order that was sent on 02/13/24.  Chelsie stated she would speak with Edwina Gram and I was placed on hold.  I was told that there is an issue with her insurance and Edwina Gram is getting further information from her insurance company and will give her a call with an update.  Called and spoke with patient and advised her of the above.  She is frustrated with [employer] because of things like waiting on the POC.  She would like to change DME companies, but it is too much on her to do all the walking and ONO to qualify with another company.  I let her know that Edwina Gram should be in touch with her regarding the POC.  She verbalized understanding.  Nothing further needed.

## 2024-03-19 NOTE — Telephone Encounter (Signed)
 Type Date User Summary Attachment  General 03/06/2024  9:05 AM Gurney Lefort *Caution - External email - see footer for warnings* -  Note: *Caution - External email - see footer for warnings* received'     Salome Credit, CSR [facility] [address] [place], [place] [address] Phone: [phone] Fax: [phone]   . Type Date User Summary Attachment  General 03/05/2024 10:48 AM Gurney Lefort Sent an urgent email to lisa at [facility] -  Note: Sent an urgent email to [employer] at [employer] . Type Date User Summary Attachment  General 02/27/2024  9:03 AM Gurney Lefort Send an email to [place] at [place] to check on this order -  Note: Send an email to [place] at [place] to check on this order . Type Date User Summary Attachment  General 02/13/2024  4:29 PM Gurney Lefort Emailed [employer] -  Note: Therapist, music

## 2024-03-19 NOTE — Radiation Completion Notes (Addendum)
  Radiation Oncology         [phone] ________________________________  Name: [name] MRN: [id number]  Date of Service: 03/18/2024  DOB: [birth year]  End of Treatment Note    Diagnosis: Stage IB, [id number], NSCLC, squamous cell carcinoma of the RUL.   Intent: Curative     ==========DELIVERED PLANS==========  First Treatment Date: 2024-03-05 Last Treatment Date: 2024-03-18   Plan Name: Lung_R_UHRT Site: Lung, Right Technique: IMRT Mode: Photon Dose Per Fraction: 6 Gy Prescribed Dose (Delivered / Prescribed): 60 Gy / 60 Gy Prescribed Fxs (Delivered / Prescribed): 10 / 10     ==========ON TREATMENT VISIT DATES========== 2024-03-08, 2024-03-15    See weekly On Treatment Notes in Epic for details in the Media tab (listed as Progress notes on the On Treatment Visit Dates listed above). The patient tolerated radiation. She developed fatigue and did continue with some cough which she noted prior to therapy.  The patient will receive a call in about one month from the radiation oncology department. She will continue follow up with Dr. [name] as well.      Shelvia Dick, PAC

## 2024-03-19 NOTE — Telephone Encounter (Signed)
 Pt states she has not gotten her POC yet.   She called [employer] and they do not have the order.  She said [employer] said it must state "Portable Oxygen Concentrator"  But [employer] said something is wrong w/ the order.

## 2024-03-21 DIAGNOSIS — G894 Chronic pain syndrome: Secondary | ICD-10-CM | POA: Diagnosis not present

## 2024-03-21 DIAGNOSIS — M5412 Radiculopathy, cervical region: Secondary | ICD-10-CM | POA: Diagnosis not present

## 2024-03-21 DIAGNOSIS — Z79891 Long term (current) use of opiate analgesic: Secondary | ICD-10-CM | POA: Diagnosis not present

## 2024-03-21 DIAGNOSIS — G8929 Other chronic pain: Secondary | ICD-10-CM | POA: Diagnosis not present

## 2024-03-21 DIAGNOSIS — M961 Postlaminectomy syndrome, not elsewhere classified: Secondary | ICD-10-CM | POA: Diagnosis not present

## 2024-03-21 DIAGNOSIS — M5459 Other low back pain: Secondary | ICD-10-CM | POA: Diagnosis not present

## 2024-03-25 ENCOUNTER — Telehealth: Payer: Self-pay

## 2024-03-25 NOTE — Telephone Encounter (Signed)
 Copied from CRM [id number]. Topic: Clinical - Order For Equipment >> Mar 08, 2024 10:48 AM [name] wrote: Reason for CRM: Patient called to request a portable oxygen tank she stated she cannot push around a big oxygen tank. Patient stated she has the tall cylinders, she needs the portable oxygen.  Handled.NFN

## 2024-03-26 ENCOUNTER — Encounter: Payer: Self-pay | Admitting: Internal Medicine

## 2024-03-26 ENCOUNTER — Ambulatory Visit (INDEPENDENT_AMBULATORY_CARE_PROVIDER_SITE_OTHER): Admitting: Internal Medicine

## 2024-03-26 VITALS — BP 110/80 | HR 100 | Ht 66.0 in | Wt 132.8 lb

## 2024-03-26 DIAGNOSIS — C3490 Malignant neoplasm of unspecified part of unspecified bronchus or lung: Secondary | ICD-10-CM | POA: Diagnosis not present

## 2024-03-26 DIAGNOSIS — F17200 Nicotine dependence, unspecified, uncomplicated: Secondary | ICD-10-CM | POA: Diagnosis not present

## 2024-03-26 DIAGNOSIS — J439 Emphysema, unspecified: Secondary | ICD-10-CM | POA: Diagnosis not present

## 2024-03-26 DIAGNOSIS — J9611 Chronic respiratory failure with hypoxia: Secondary | ICD-10-CM | POA: Diagnosis not present

## 2024-03-26 DIAGNOSIS — J4489 Other specified chronic obstructive pulmonary disease: Secondary | ICD-10-CM

## 2024-03-26 NOTE — Patient Instructions (Addendum)
 It was a pleasure to see you today!  Please schedule follow up with myself in 6 months.  If my schedule is not open yet, we will contact you with a reminder closer to that time. Please call [phone] if you haven't heard from us  a month before, and always call us  sooner if issues or concerns arise. You can also send us  a message through MyChart, but but aware that this is not to be used for urgent issues and it may take up to 5-7 days to receive a reply. Please be aware that you will likely be able to view your results before I have a chance to respond to them. Please give us  5 business days to respond to any non-urgent results.   Congratulations on cutting back on smoking.  Keep up the good work I hope you quit completely. I will put in another order for the portable oxygen concentrator.  However if [employer] is recommending you go there for lung test, that is probably the easiest way to get your POC. Will follow-up on the patient assistance paperwork for the Trelegy.  In the meantime I will give you some samples today.

## 2024-03-26 NOTE — Progress Notes (Signed)
 [name]    [id number]    March 18, 1961  Primary Care Physician:Burchette, Marijean Shouts, MD Date of Appointment: 03/26/2024 Established Patient Visit  Chief complaint:   Chief Complaint  Patient presents with   Follow-up    Patient states her breathing is better. Pt wants to qualify for POC    HPI: [name] is a 63 y.o. woman with COPD and lung cancer s/p radiation. Also on home oxygen 4LNC pulse.  Interval Updates:  Still on trelegy inhaler 1 puff once daily.  She filled out paperwork for patience assistance and hasn't heard anything. She feels it is helping.   She has cut back on smoking, down to two cigarettes a day.   Uses albuterol  inhaler as needed.   Was hospitalized for COPD exacerbation in May 2025.   I have reviewed the patient's family social and past medical history and updated as appropriate.   Past Medical History:  Diagnosis Date   Arthritis    cervical spine   ASTHMA UNSPECIFIED WITH EXACERBATION 07/21/2010   CARPAL TUNNEL SYNDROME, BILATERAL 01/16/2008   COPD 05/08/2008   Dyspnea    intermittent. worsens with anxiety   GERD 04/18/2007   HELICOBACTER PYLORI GASTRITIS, HX OF 04/18/2007   PANIC DISORDER 04/18/2007   Pneumonia    yrs ago   TOBACCO ABUSE 07/17/2009    Past Surgical History:  Procedure Laterality Date   ANTERIOR CERVICAL DECOMP/DISCECTOMY FUSION N/A 11/19/2021   Procedure: CERVICAL FOUR-FIVE, CERVICAL FIVE-SIX, CERVICAL SIX-SEVEN ANTERIOR CERVICAL DECOMPRESSION/DISCECTOMY FUSION;  Surgeon: Isadora Mar, MD;  Location: [facility] OR;  Service: Neurosurgery;  Laterality: N/A;   APPENDECTOMY     BRONCHIAL BIOPSY  01/23/2024   Procedure: BRONCHOSCOPY, WITH BIOPSY;  Surgeon: Denson Flake, MD;  Location: [facility] ENDOSCOPY;  Service: Pulmonary;;   BRONCHIAL NEEDLE ASPIRATION BIOPSY  01/23/2024   Procedure: BRONCHOSCOPY, WITH NEEDLE ASPIRATION BIOPSY;  Surgeon: Denson Flake, MD;  Location: [facility] ENDOSCOPY;  Service: Pulmonary;;    OOPHORECTOMY     POSTERIOR CERVICAL FUSION/FORAMINOTOMY N/A 03/25/2022   Procedure: Cervical Three-Cervical Seven Posterior Cervical Fusion with Lateral Mass Fixation;  Surgeon: Isadora Mar, MD;  Location: [facility] OR;  Service: Neurosurgery;  Laterality: N/A;    Family History  Problem Relation Age of Onset   Diabetes Mother    Cancer Father        lung smoke    Heart disease Father    Clotting disorder Brother    Breast cancer Paternal Aunt    Stomach cancer Paternal Uncle    Esophageal cancer Neg Hx    Colon cancer Neg Hx     Social History   Occupational History   Occupation: Scientist, research (medical): SELF-EMPLOYED  Tobacco Use   Smoking status: Every Day    Current packs/day: 0.75    Average packs/day: 1.5 packs/day for 50.5 years (74.6 ttl pk-yrs)    Types: Cigarettes    Start date: 1975   Smokeless tobacco: Never   Tobacco comments:    Trying nicotine  patches, made her nauseous at first, going to cut patches in half.    2 cigarette a day-01/17/2024  Vaping Use   Vaping status: Never Used  Substance and Sexual Activity   Alcohol use: Yes    Comment: occasional   Drug use: No   Sexual activity: Not Currently    Birth control/protection: Post-menopausal     Physical Exam: Blood pressure 110/80, pulse 100, height 5' 6 (  1.676 m), weight 132 lb 12.8 oz (60.2 kg), SpO2 95%.  Gen:      No acute distress, chronically ill appearing ENT:  nasal cannula, no nasal polyps, mucus membranes moist Lungs:   diminished, no wheezes or crackles CV:         tachycardic, regular, no edema   Data Reviewed: Imaging: I have personally reviewed the CT Chest April 2025 shows moderate emphysema, RUL nodule  PFTs:     Latest Ref Rng & Units 06/07/2018    4:10 PM  PFT Results  FVC-Pre L 2.25   FVC-Predicted Pre % 64   Pre FEV1/FVC % % 57   FEV1-Pre L 1.28   FEV1-Predicted Pre % 47    I have personally reviewed the patient's PFTs and FEV1 47% of predicted  Labs: Lab Results   Component Value Date   WBC 14.2 (H) 02/12/2024   HGB 12.9 02/12/2024   HCT 40.4 02/12/2024   MCV 93.5 02/12/2024   PLT 363 02/12/2024   Lab Results  Component Value Date   NA 137 02/12/2024   K 4.6 02/12/2024   CO2 34 (H) 02/12/2024   GLUCOSE 175 (H) 02/12/2024   BUN 14 02/12/2024   CREATININE 0.69 02/12/2024   CALCIUM  9.8 02/12/2024   GFR 84.03 07/31/2018   GFRNONAA >60 02/12/2024    Immunization status: Immunization History  Administered Date(s) Administered   Influenza Whole 09/24/2010, 06/18/2013   Influenza,inj,Quad PF,6+ Mos 08/06/2014, 08/08/2016, 08/23/2017, 06/20/2018, 07/09/2019, 08/31/2021   Pneumococcal Conjugate-13 09/14/2015   Tdap 11/16/2021    External Records Personally Reviewed: pulmonary, hospital stay  Assessment:  Severe COPD FEV1 47% of predcited Tobacco use disorder SCC of the lung s/p radiation therapy Chronic respiratory failure    Plan/Recommendations: Congratulations on cutting back on smoking.  Keep up the good work I hope you quit completely.  I will put in another order for the portable oxygen concentrator.   However if [employer] is recommending you go there for lung test, that is probably the easiest way to get your POC.  Will follow-up on the patient assistance paperwork for the Trelegy.  In the meantime I will give you some samples today.  Return to Care: Return in about 4 months (around 07/26/2024).   Louie Rover, MD Pulmonary and Critical Care Medicine [facility] Office:[phone]

## 2024-03-28 ENCOUNTER — Telehealth: Payer: Self-pay

## 2024-03-28 NOTE — Telephone Encounter (Signed)
 Copied from CRM [id number]. Topic: Clinical - Order For Equipment >> Mar 28, 2024 10:38 AM [name] wrote: Reason for CRM: Patient states she needs continuous oxygen not the one that only bring out oxygen while she's breathing. Patient states her appointment is tomorrow morning for her portable oxygen concentrator.    Spoke with patient misunderstanding from dme about how the POC works. I explained to her how the POC work she verbalized understanding.    NFN

## 2024-03-29 ENCOUNTER — Telehealth: Payer: Self-pay | Admitting: *Deleted

## 2024-03-29 NOTE — Telephone Encounter (Signed)
 Copied from CRM [id number]. Topic: Clinical - Order For Equipment >> Mar 15, 2024 12:20 PM [name] wrote: Reason for CRM: Patient called stating she is having an issue with [employer], patient stated they need the correct order sent over and it needs to specifically say portable oxygen concentrator. Patient stated [employer] informed her the order that was sent over on yesterday is incorrect. >> Mar 15, 2024 12:29 PM [name] wrote: See tel encounter with regard to this order. [name] had spoken to her about it. Fwd'g to [employer].  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  closing

## 2024-04-10 DIAGNOSIS — T1490XA Injury, unspecified, initial encounter: Secondary | ICD-10-CM | POA: Diagnosis not present

## 2024-04-11 ENCOUNTER — Other Ambulatory Visit: Payer: Self-pay | Admitting: Acute Care

## 2024-04-11 DIAGNOSIS — R051 Acute cough: Secondary | ICD-10-CM

## 2024-04-11 DIAGNOSIS — F32A Depression, unspecified: Secondary | ICD-10-CM | POA: Diagnosis not present

## 2024-04-11 DIAGNOSIS — F410 Panic disorder [episodic paroxysmal anxiety] without agoraphobia: Secondary | ICD-10-CM | POA: Diagnosis not present

## 2024-04-11 DIAGNOSIS — F411 Generalized anxiety disorder: Secondary | ICD-10-CM | POA: Diagnosis not present

## 2024-04-11 NOTE — Telephone Encounter (Signed)
 FYI Only or Action Required?: Action required by provider: medication refill request.  Patient is followed in Pulmonology for COPD, Neoplasm of the lung, last seen on 03/26/2024 by Meade Verdon RAMAN, MD. Called Nurse Triage reporting No chief complaint on file.SABRA

## 2024-04-11 NOTE — Telephone Encounter (Signed)
**Note De-identified  [place] Obfuscation** Please advise 

## 2024-04-11 NOTE — Telephone Encounter (Signed)
 Copied from CRM [id number]. Topic: Clinical - Medication Refill >> Apr 11, 2024 12:17 PM Dustin F wrote: Medication: guaiFENesin -codeine  (GUAIATUSSIN AC) 100-10 MG/5ML syrup   Has the patient contacted their pharmacy? Yes; there's no refills on the prescription. (Agent: If no, request that the patient contact the pharmacy for the refill. If patient does not wish to contact the pharmacy document the reason why and proceed with request.) (Agent: If yes, when and what did the pharmacy advise?)  This is the patient's preferred pharmacy:  [id number] - [place], [place] - [address] [place] [address] Phone: [phone] Fax: [phone]   Is this the correct pharmacy for this prescription? Yes If no, delete pharmacy and type the correct one.   Has the prescription been filled recently? Yes; Lauraine Lites NP prescribed it to the pt on 02/13/2024  Is the patient out of the medication? Yes  Has the patient been seen for an appointment in the last year OR does the patient have an upcoming appointment? Yes  Can we respond through MyChart? Yes  Agent: Please be advised that Rx refills may take up to 3 business days. We ask that you follow-up with your pharmacy.

## 2024-04-18 DIAGNOSIS — M5412 Radiculopathy, cervical region: Secondary | ICD-10-CM | POA: Diagnosis not present

## 2024-04-18 DIAGNOSIS — G894 Chronic pain syndrome: Secondary | ICD-10-CM | POA: Diagnosis not present

## 2024-04-18 DIAGNOSIS — M542 Cervicalgia: Secondary | ICD-10-CM | POA: Diagnosis not present

## 2024-04-18 DIAGNOSIS — M5459 Other low back pain: Secondary | ICD-10-CM | POA: Diagnosis not present

## 2024-04-18 DIAGNOSIS — Z79891 Long term (current) use of opiate analgesic: Secondary | ICD-10-CM | POA: Diagnosis not present

## 2024-05-02 ENCOUNTER — Other Ambulatory Visit: Payer: Self-pay | Admitting: Internal Medicine

## 2024-05-02 ENCOUNTER — Other Ambulatory Visit: Payer: Self-pay | Admitting: Family Medicine

## 2024-05-02 DIAGNOSIS — J441 Chronic obstructive pulmonary disease with (acute) exacerbation: Secondary | ICD-10-CM

## 2024-05-05 DIAGNOSIS — T1490XA Injury, unspecified, initial encounter: Secondary | ICD-10-CM | POA: Diagnosis not present

## 2024-05-08 ENCOUNTER — Telehealth: Payer: Self-pay

## 2024-05-08 NOTE — Telephone Encounter (Signed)
 Copied from CRM [id number]. Topic: Appointments - Scheduling Inquiry for Clinic >> May 06, 2024  4:51 PM Celestine F wrote: Reason for CRM: Pt is requesting to schedule a 3 mo f/u appt with NP Lauraine Lites. When I try to schedule the follow up appt, an error populates and requests me to [facility] Scheduling. It states There is an active order under the Active Request tab for the patient titled Ambulatory order to Pulmonary. Please exit the tree and schedule from the order.   When I do that, even after talking with Tannessa with [employer] to open it from a closed status, it still populates this pop up claiming there's an error. Please assist with this and help schedule the patient for a 3 mo f/u appt.   Pt's phone number is [phone] ok to leave a vm. >> May 07, 2024  4:50 PM Cranford J wrote: Left message for patient to call our office back to get scheduled.

## 2024-05-08 NOTE — Telephone Encounter (Signed)
 Spoke with patient and attempted to schedule follow up appt with Lauraine. Offered 3 appts in August, pt declined at this time. Advises she is going out of town, needs to complete her CT for Dr. Emery. Advises she will call back once she looks at her schedule and talks with her sister who will provide transportation. Patient is interested in being scheduled the last week in August.

## 2024-05-09 DIAGNOSIS — F32A Depression, unspecified: Secondary | ICD-10-CM | POA: Diagnosis not present

## 2024-05-09 DIAGNOSIS — F410 Panic disorder [episodic paroxysmal anxiety] without agoraphobia: Secondary | ICD-10-CM | POA: Diagnosis not present

## 2024-05-09 DIAGNOSIS — F411 Generalized anxiety disorder: Secondary | ICD-10-CM | POA: Diagnosis not present

## 2024-05-10 ENCOUNTER — Other Ambulatory Visit: Payer: Self-pay | Admitting: Acute Care

## 2024-05-10 MED ORDER — TRELEGY ELLIPTA 200-62.5-25 MCG/ACT IN AEPB: 1.0000 | INHALATION_SPRAY | Freq: Every day | RESPIRATORY_TRACT | Status: DC

## 2024-05-10 NOTE — Telephone Encounter (Signed)
 Patient called back. Informed her she doesn't need an August appt if she doesn't want to come in early. Per Dr. Correne last note she doesn't need to return until October-December (4-6 month follow up). Patient states she is ok waiting until a later appt. ROV with Dr. Meade has been scheduled for 08/13/2024.   While on the phone patient requested an update with her patient assistance forms for Trelegy and request samples to hold her over until the assistance comes through.   Patient also requests a refill of the Guaiatussin AC. Patient states this really helps her produce mucus and helps her breathing. Last refilled in 02/2024.

## 2024-05-10 NOTE — Telephone Encounter (Signed)
 I will give pt 2 samples of Trelegy 200 ( verbal okay from Lauraine Lites, NP) Pt would also like to know if Lauraine would rx her the Guaiatussin as this helped her with mucus and to relax. Routing to Lauraine to advise on the rx and routing to [place] to f/u on the PAP.

## 2024-05-10 NOTE — Telephone Encounter (Signed)
 I called and spoke to [name]. I informed [name] that she would need to see Dr Micheal as he handles the opioid medications. [name] verbalized understanding. NFN

## 2024-05-10 NOTE — Addendum Note (Signed)
 Addended by: Elsi Stelzer T on: 05/10/2024 11:15 AM   Modules accepted: Orders

## 2024-05-11 DIAGNOSIS — T1490XA Injury, unspecified, initial encounter: Secondary | ICD-10-CM | POA: Diagnosis not present

## 2024-05-16 DIAGNOSIS — G8929 Other chronic pain: Secondary | ICD-10-CM | POA: Diagnosis not present

## 2024-05-16 DIAGNOSIS — G894 Chronic pain syndrome: Secondary | ICD-10-CM | POA: Diagnosis not present

## 2024-05-16 DIAGNOSIS — M5459 Other low back pain: Secondary | ICD-10-CM | POA: Diagnosis not present

## 2024-05-16 DIAGNOSIS — Z79891 Long term (current) use of opiate analgesic: Secondary | ICD-10-CM | POA: Diagnosis not present

## 2024-05-16 DIAGNOSIS — M961 Postlaminectomy syndrome, not elsewhere classified: Secondary | ICD-10-CM | POA: Diagnosis not present

## 2024-05-16 DIAGNOSIS — M5412 Radiculopathy, cervical region: Secondary | ICD-10-CM | POA: Diagnosis not present

## 2024-05-31 ENCOUNTER — Other Ambulatory Visit (HOSPITAL_COMMUNITY): Payer: Self-pay

## 2024-06-05 DIAGNOSIS — T1490XA Injury, unspecified, initial encounter: Secondary | ICD-10-CM | POA: Diagnosis not present

## 2024-06-06 DIAGNOSIS — F411 Generalized anxiety disorder: Secondary | ICD-10-CM | POA: Diagnosis not present

## 2024-06-06 DIAGNOSIS — F32A Depression, unspecified: Secondary | ICD-10-CM | POA: Diagnosis not present

## 2024-06-06 DIAGNOSIS — F410 Panic disorder [episodic paroxysmal anxiety] without agoraphobia: Secondary | ICD-10-CM | POA: Diagnosis not present

## 2024-06-07 ENCOUNTER — Ambulatory Visit (HOSPITAL_COMMUNITY)
Admission: RE | Admit: 2024-06-07 | Discharge: 2024-06-07 | Disposition: A | Discharge status: Home / Self Care | Source: Ambulatory Visit | Attending: Internal Medicine | Admitting: Internal Medicine

## 2024-06-07 ENCOUNTER — Inpatient Hospital Stay: Attending: Internal Medicine

## 2024-06-07 DIAGNOSIS — C3490 Malignant neoplasm of unspecified part of unspecified bronchus or lung: Secondary | ICD-10-CM

## 2024-06-07 DIAGNOSIS — I700 Atherosclerosis of aorta: Secondary | ICD-10-CM | POA: Diagnosis not present

## 2024-06-07 DIAGNOSIS — C3411 Malignant neoplasm of upper lobe, right bronchus or lung: Secondary | ICD-10-CM | POA: Insufficient documentation

## 2024-06-07 DIAGNOSIS — J439 Emphysema, unspecified: Secondary | ICD-10-CM | POA: Diagnosis not present

## 2024-06-07 DIAGNOSIS — F17210 Nicotine dependence, cigarettes, uncomplicated: Secondary | ICD-10-CM | POA: Insufficient documentation

## 2024-06-07 LAB — CMP (CANCER CENTER ONLY)
ALT: 6 U/L (ref 0–44)
AST: 11 U/L — ABNORMAL LOW (ref 15–41)
Albumin: 4.1 g/dL (ref 3.5–5.0)
Alkaline Phosphatase: 65 U/L (ref 38–126)
Anion gap: 5 (ref 5–15)
BUN: 10 mg/dL (ref 8–23)
CO2: 35 mmol/L — ABNORMAL HIGH (ref 22–32)
Calcium: 9.5 mg/dL (ref 8.9–10.3)
Chloride: 96 mmol/L — ABNORMAL LOW (ref 98–111)
Creatinine: 0.71 mg/dL (ref 0.44–1.00)
GFR, Estimated: >60 mL/min (ref >60)
Glucose, Bld: 109 mg/dL — ABNORMAL HIGH (ref 70–99)
Potassium: 4.3 mmol/L (ref 3.5–5.1)
Sodium: 136 mmol/L (ref 135–145)
Total Bilirubin: 0.3 mg/dL (ref 0.0–1.2)
Total Protein: 7.5 g/dL (ref 6.5–8.1)

## 2024-06-07 LAB — CBC WITH DIFFERENTIAL (CANCER CENTER ONLY)
Abs Immature Granulocytes: 0.01 K/uL (ref 0.00–0.07)
Basophils Absolute: 0 K/uL (ref 0.0–0.1)
Basophils Relative: 1 %
Eosinophils Absolute: 0.1 K/uL (ref 0.0–0.5)
Eosinophils Relative: 1 %
HCT: 38.4 % (ref 36.0–46.0)
Hemoglobin: 12.4 g/dL (ref 12.0–15.0)
Immature Granulocytes: 0 %
Lymphocytes Relative: 24 %
Lymphs Abs: 1.2 K/uL (ref 0.7–4.0)
MCH: 29.6 pg (ref 26.0–34.0)
MCHC: 32.3 g/dL (ref 30.0–36.0)
MCV: 91.6 fL (ref 80.0–100.0)
Monocytes Absolute: 0.4 K/uL (ref 0.1–1.0)
Monocytes Relative: 8 %
Neutro Abs: 3.2 K/uL (ref 1.7–7.7)
Neutrophils Relative %: 66 %
Platelet Count: 231 K/uL (ref 150–400)
RBC: 4.19 MIL/uL (ref 3.87–5.11)
RDW: 14.7 % (ref 11.5–15.5)
WBC Count: 4.9 K/uL (ref 4.0–10.5)
nRBC: 0 % (ref 0.0–0.2)

## 2024-06-07 MED ORDER — IOHEXOL 300 MG/ML  SOLN
75.0000 mL | Freq: Once | INTRAMUSCULAR | Status: AC | PRN
  Administered 2024-06-07: 75 mL via INTRAVENOUS

## 2024-06-11 DIAGNOSIS — T1490XA Injury, unspecified, initial encounter: Secondary | ICD-10-CM | POA: Diagnosis not present

## 2024-06-13 ENCOUNTER — Inpatient Hospital Stay: Attending: Internal Medicine | Admitting: Internal Medicine

## 2024-06-13 VITALS — BP 138/75 | HR 105 | Temp 97.8°F | Resp 17 | Ht 66.0 in | Wt 122.0 lb

## 2024-06-13 DIAGNOSIS — Z923 Personal history of irradiation: Secondary | ICD-10-CM | POA: Insufficient documentation

## 2024-06-13 DIAGNOSIS — J449 Chronic obstructive pulmonary disease, unspecified: Secondary | ICD-10-CM | POA: Diagnosis not present

## 2024-06-13 DIAGNOSIS — C3411 Malignant neoplasm of upper lobe, right bronchus or lung: Secondary | ICD-10-CM | POA: Diagnosis not present

## 2024-06-13 DIAGNOSIS — G894 Chronic pain syndrome: Secondary | ICD-10-CM | POA: Diagnosis not present

## 2024-06-13 DIAGNOSIS — C3490 Malignant neoplasm of unspecified part of unspecified bronchus or lung: Secondary | ICD-10-CM | POA: Diagnosis not present

## 2024-06-13 DIAGNOSIS — Z79891 Long term (current) use of opiate analgesic: Secondary | ICD-10-CM | POA: Diagnosis not present

## 2024-06-13 DIAGNOSIS — Z9981 Dependence on supplemental oxygen: Secondary | ICD-10-CM | POA: Insufficient documentation

## 2024-06-13 NOTE — Progress Notes (Unsigned)
 [facility] [phone]   [phone]  OFFICE PROGRESS NOTE  Micheal Wolm ORN, MD [address] [place] [address]  DIAGNOSIS: Stage IB (T2a, N0, M0) non-small cell lung cancer, squamous cell carcinoma presented with right upper lobe lung mass diagnosed in April 2025.   PRIOR THERAPY: Curative SBRT to the right upper lobe lung nodule under the care of Dr. Dewey completed on 03/18/2024.  CURRENT THERAPY: Observation  INTERVAL HISTORY: [name] 63 y.o. female returns to the clinic today for follow-up visit.   Discussed the use of AI scribe software for clinical note transcription with the patient, who gave verbal consent to proceed.  History of Present Illness [name] is a 63 year old female with stage 1B non-small cell lung cancer who presents for evaluation with repeat CT scan for restaging of her disease. She is accompanied by her granddaughters, Ileana and Israel.  Diagnosed with stage 1B non-small cell lung cancer, squamous cell carcinoma, in April 2025. Underwent curative stereotactic body radiation therapy (SBRT) to the right upper lobe lung nodule, completed on March 18, 2024. Currently in an observation period and here for a repeat CT scan to restage her disease.  On home oxygen  therapy at 2 liters due to chronic obstructive pulmonary disease (COPD). Feels embarrassed to go out because of the oxygen  use, impacting her social life. Was more socially active before her neck injury and subsequent surgery, which has limited her activities.  Aware of gallstones and coronary heart disease.     MEDICAL HISTORY: Past Medical History:  Diagnosis Date   Arthritis    cervical spine   ASTHMA UNSPECIFIED WITH EXACERBATION 07/21/2010   CARPAL TUNNEL SYNDROME, BILATERAL 01/16/2008   COPD 05/08/2008   Dyspnea    intermittent. worsens with anxiety   GERD 04/18/2007   HELICOBACTER PYLORI GASTRITIS, HX OF 04/18/2007   PANIC  DISORDER 04/18/2007   Pneumonia    yrs ago   TOBACCO ABUSE 07/17/2009    ALLERGIES:  is allergic to aspirin.  MEDICATIONS:  Current Outpatient Medications  Medication Sig Dispense Refill   acetaminophen  (TYLENOL ) 500 MG tablet Take 1,000 mg by mouth every 8 (eight) hours.     ALPRAZolam  (XANAX ) 1 MG tablet Take 1 mg by mouth 3 (three) times daily.     cyclobenzaprine  (FLEXERIL ) 10 MG tablet Take 10 mg by mouth 2 (two) times daily as needed for muscle spasms.     Ensifentrine  (OHTUVAYRE ) 3 MG/2.5ML SUSP Inhale 1 ampule into the lungs 2 (two) times daily.     Fluticasone -Umeclidin-Vilant (TRELEGY ELLIPTA ) 100-62.5-25 MCG/ACT AEPB Inhale 1 puff into the lungs daily at 2 PM.     Fluticasone -Umeclidin-Vilant (TRELEGY ELLIPTA ) 200-62.5-25 MCG/ACT AEPB Inhale 1 puff into the lungs daily at 2 PM.     Fluticasone -Umeclidin-Vilant (TRELEGY ELLIPTA ) 200-62.5-25 MCG/ACT AEPB Inhale 1 puff into the lungs daily.     guaiFENesin -codeine  (GUAIATUSSIN AC) 100-10 MG/5ML syrup Take 5 mLs by mouth every 4 (four) hours as needed for cough. Take 5 mLs by mouth every 4 (four) hours as needed for cough or to loosen phlegm. 120 mL 0   ipratropium-albuterol  (DUONEB) 0.5-2.5 (3) MG/3ML SOLN INHALE 3 ML BY NEBULIZER EVERY 6 HOURS AS NEEDED 360 mL 5   nicotine  (NICODERM CQ  - DOSED IN MG/24 HOURS) 21 mg/24hr patch Place 1 patch (21 mg total) onto the skin daily. 28 patch 0   Oxycodone  HCl 10 MG TABS Take 10 mg by mouth  in the morning and at bedtime.     pantoprazole  (PROTONIX ) 40 MG tablet Take 1 tablet (40 mg total) by mouth at bedtime. 30 tablet 0   VENTOLIN  HFA 108 (90 Base) MCG/ACT inhaler INHALE 2 PUFFS INTO THE LUNGS EVERY 4 HOURS AS NEEDED FOR WHEEZING OR SHORTNESS OF BREATH. 18 each 1   No current facility-administered medications for this visit.    SURGICAL HISTORY:  Past Surgical History:  Procedure Laterality Date   ANTERIOR CERVICAL DECOMP/DISCECTOMY FUSION N/A 11/19/2021   Procedure: CERVICAL  FOUR-FIVE, CERVICAL FIVE-SIX, CERVICAL SIX-SEVEN ANTERIOR CERVICAL DECOMPRESSION/DISCECTOMY FUSION;  Surgeon: Joshua Alm RAMAN, MD;  Location: [facility] OR;  Service: Neurosurgery;  Laterality: N/A;   APPENDECTOMY     BRONCHIAL BIOPSY  01/23/2024   Procedure: BRONCHOSCOPY, WITH BIOPSY;  Surgeon: Shelah Lamar RAMAN, MD;  Location: [facility] ENDOSCOPY;  Service: Pulmonary;;   BRONCHIAL NEEDLE ASPIRATION BIOPSY  01/23/2024   Procedure: BRONCHOSCOPY, WITH NEEDLE ASPIRATION BIOPSY;  Surgeon: Shelah Lamar RAMAN, MD;  Location: [facility] ENDOSCOPY;  Service: Pulmonary;;   OOPHORECTOMY     POSTERIOR CERVICAL FUSION/FORAMINOTOMY N/A 03/25/2022   Procedure: Cervical Three-Cervical Seven Posterior Cervical Fusion with Lateral Mass Fixation;  Surgeon: Joshua Alm RAMAN, MD;  Location: [facility] OR;  Service: Neurosurgery;  Laterality: N/A;    REVIEW OF SYSTEMS:  A comprehensive review of systems was negative except for: Constitutional: positive for fatigue Respiratory: positive for dyspnea on exertion   PHYSICAL EXAMINATION: General appearance: alert, cooperative, fatigued, and no distress Head: Normocephalic, without obvious abnormality, atraumatic Neck: no adenopathy, no JVD, supple, symmetrical, trachea midline, and thyroid not enlarged, symmetric, no tenderness/mass/nodules Lymph nodes: Cervical, supraclavicular, and axillary nodes normal. Resp: clear to auscultation bilaterally Back: symmetric, no curvature. ROM normal. No CVA tenderness. Cardio: regular rate and rhythm, S1, S2 normal, no murmur, click, rub or gallop GI: soft, non-tender; bowel sounds normal; no masses,  no organomegaly Extremities: extremities normal, atraumatic, no cyanosis or edema  ECOG PERFORMANCE STATUS: 1 - Symptomatic but completely ambulatory  Blood pressure 138/75, pulse (!) 105, temperature 97.8 F (36.6 C), temperature source Temporal, resp. rate 17, height 5' 6 (1.676 m), weight 122 lb (55.3 kg), SpO2 97%.  LABORATORY DATA: Lab Results  Component Value  Date   WBC 4.9 06/07/2024   HGB 12.4 06/07/2024   HCT 38.4 06/07/2024   MCV 91.6 06/07/2024   PLT 231 06/07/2024      Chemistry      Component Value Date/Time   NA 136 06/07/2024 1435   K 4.3 06/07/2024 1435   CL 96 (L) 06/07/2024 1435   CO2 35 (H) 06/07/2024 1435   BUN 10 06/07/2024 1435   CREATININE 0.71 06/07/2024 1435   CREATININE 0.74 04/29/2020 1347      Component Value Date/Time   CALCIUM  9.5 06/07/2024 1435   ALKPHOS 65 06/07/2024 1435   AST 11 (L) 06/07/2024 1435   ALT 6 06/07/2024 1435   BILITOT 0.3 06/07/2024 1435       RADIOGRAPHIC STUDIES: CT Chest W Contrast Result Date: 06/09/2024 CLINICAL DATA:  Non-small cell lung cancer restaging * Tracking Code: BO * EXAM: CT CHEST WITH CONTRAST TECHNIQUE: Multidetector CT imaging of the chest was performed during intravenous contrast administration. RADIATION DOSE REDUCTION: This exam was performed according to the departmental dose-optimization program which includes automated exposure control, adjustment of the mA and/or kV according to patient size and/or use of iterative reconstruction technique. CONTRAST:  75mL OMNIPAQUE  IOHEXOL  300 MG/ML  SOLN COMPARISON:  01/19/2024 FINDINGS: Cardiovascular: Aortic atherosclerosis.  Normal heart size. Left and right coronary artery calcifications. No pericardial effusion. Mediastinum/Nodes: No enlarged mediastinal, hilar, or axillary lymph nodes. Thyroid gland, trachea, and esophagus demonstrate no significant findings. Lungs/Pleura: Severe emphysema. Significant interval decrease in size of a hilar mass of the right upper lobe, which is now cavitary, on today's examination measuring 1.9 x 1.6 cm, previously 3.5 x 3.1 cm (series 4, image 74). No pleural effusion or pneumothorax. Upper Abdomen: No acute abnormality.  Gallstones. Musculoskeletal: No chest wall abnormality. No acute osseous findings. IMPRESSION: 1. Significant interval decrease in size of a hilar mass of the right upper lobe,  which is now cavitary, on today's examination measuring 1.9 x 1.6 cm, previously 3.5 x 3.1 cm. Findings are consistent with treatment response. 2. No evidence of lymphadenopathy or metastatic disease in the chest. 3. Severe emphysema. 4. Coronary artery disease. 5. Cholelithiasis. Aortic Atherosclerosis (ICD10-I70.0) and Emphysema (ICD10-J43.9). Electronically Signed   By: Marolyn JONETTA Jaksch M.D.   On: 06/09/2024 16:37    ASSESSMENT AND PLAN: This is a very pleasant 63 years old white female with tage IB (T2a, N0, M0) non-small cell lung cancer, squamous cell carcinoma presented with right upper lobe lung mass diagnosed in April 2025.  She is status post curative SBRT to the right upper lobe lung nodule under the care of Dr. Dewey completed on 03/18/2024. The patient is currently on observation.  She had repeat CT scan of the chest performed recently.  I personally and independently reviewed the scan and discussed the result with the patient and her granddaughters today.  Her scan showed no concerning findings for disease progression and there was significant improvement in the treated right upper lobe mass. Assessment and Plan Assessment & Plan Non-small cell lung cancer, right upper lobe, post-curative SBRT, under observation Stage 1B non-small cell lung cancer, squamous cell carcinoma, diagnosed in April 2025. Status post curative SBRT to the right upper lobe lung nodule completed on March 18, 2024. Currently under observation. Recent CT scan shows significant reduction in tumor size from 3.5 x 3.1 cm to 1.9 x 1.6 cm, indicating a good response to treatment. The presence of a scar due to radiation is expected. - Continue observation with follow-up every six months.  Chronic obstructive pulmonary disease requiring home oxygen  Chronic obstructive pulmonary disease requiring home oxygen  therapy. Currently using 2 liters of oxygen . She expresses embarrassment about using oxygen  in public, impacting her social  activities. - Encourage continued use of home oxygen  therapy as prescribed. She was advised to call immediately if she has any other concerning symptoms in the interval. The patient voices understanding of current disease status and treatment options and is in agreement with the current care plan.  All questions were answered. The patient knows to call the clinic with any problems, questions or concerns. We can certainly see the patient much sooner if necessary. The total time spent in the appointment was 20 minutes including review of chart and various tests results, discussions about plan of care and coordination of care plan .   Disclaimer: This note was dictated with voice recognition software. Similar sounding words can inadvertently be transcribed and may not be corrected upon review.

## 2024-06-14 ENCOUNTER — Telehealth: Payer: Self-pay | Admitting: Internal Medicine

## 2024-06-14 NOTE — Telephone Encounter (Signed)
 Scheduled patient appointments, called and left voicemail with appointment details

## 2024-07-06 DIAGNOSIS — T1490XA Injury, unspecified, initial encounter: Secondary | ICD-10-CM | POA: Diagnosis not present

## 2024-07-10 DIAGNOSIS — F32A Depression, unspecified: Secondary | ICD-10-CM | POA: Diagnosis not present

## 2024-07-10 DIAGNOSIS — F411 Generalized anxiety disorder: Secondary | ICD-10-CM | POA: Diagnosis not present

## 2024-07-10 DIAGNOSIS — F410 Panic disorder [episodic paroxysmal anxiety] without agoraphobia: Secondary | ICD-10-CM | POA: Diagnosis not present

## 2024-07-11 DIAGNOSIS — Z79891 Long term (current) use of opiate analgesic: Secondary | ICD-10-CM | POA: Diagnosis not present

## 2024-07-11 DIAGNOSIS — M545 Low back pain, unspecified: Secondary | ICD-10-CM | POA: Diagnosis not present

## 2024-07-11 DIAGNOSIS — T1490XA Injury, unspecified, initial encounter: Secondary | ICD-10-CM | POA: Diagnosis not present

## 2024-07-11 DIAGNOSIS — G894 Chronic pain syndrome: Secondary | ICD-10-CM | POA: Diagnosis not present

## 2024-07-11 DIAGNOSIS — G8929 Other chronic pain: Secondary | ICD-10-CM | POA: Diagnosis not present

## 2024-07-11 DIAGNOSIS — M961 Postlaminectomy syndrome, not elsewhere classified: Secondary | ICD-10-CM | POA: Diagnosis not present

## 2024-07-12 ENCOUNTER — Other Ambulatory Visit: Payer: Self-pay | Admitting: Family Medicine

## 2024-07-12 DIAGNOSIS — J441 Chronic obstructive pulmonary disease with (acute) exacerbation: Secondary | ICD-10-CM

## 2024-08-05 DIAGNOSIS — T1490XA Injury, unspecified, initial encounter: Secondary | ICD-10-CM | POA: Diagnosis not present

## 2024-08-08 DIAGNOSIS — M5412 Radiculopathy, cervical region: Secondary | ICD-10-CM | POA: Diagnosis not present

## 2024-08-08 DIAGNOSIS — Z79891 Long term (current) use of opiate analgesic: Secondary | ICD-10-CM | POA: Diagnosis not present

## 2024-08-08 DIAGNOSIS — G894 Chronic pain syndrome: Secondary | ICD-10-CM | POA: Diagnosis not present

## 2024-08-08 DIAGNOSIS — F410 Panic disorder [episodic paroxysmal anxiety] without agoraphobia: Secondary | ICD-10-CM | POA: Diagnosis not present

## 2024-08-08 DIAGNOSIS — M542 Cervicalgia: Secondary | ICD-10-CM | POA: Diagnosis not present

## 2024-08-08 DIAGNOSIS — F411 Generalized anxiety disorder: Secondary | ICD-10-CM | POA: Diagnosis not present

## 2024-08-08 DIAGNOSIS — M5459 Other low back pain: Secondary | ICD-10-CM | POA: Diagnosis not present

## 2024-08-08 DIAGNOSIS — F32A Depression, unspecified: Secondary | ICD-10-CM | POA: Diagnosis not present

## 2024-08-11 DIAGNOSIS — T1490XA Injury, unspecified, initial encounter: Secondary | ICD-10-CM | POA: Diagnosis not present

## 2024-08-13 ENCOUNTER — Encounter: Payer: Self-pay | Admitting: Internal Medicine

## 2024-08-13 ENCOUNTER — Ambulatory Visit: Admitting: Internal Medicine

## 2024-08-13 VITALS — BP 97/67 | HR 93 | Temp 98.5°F | Ht 66.0 in | Wt 122.0 lb

## 2024-08-13 DIAGNOSIS — J4489 Other specified chronic obstructive pulmonary disease: Secondary | ICD-10-CM | POA: Diagnosis not present

## 2024-08-13 DIAGNOSIS — C3491 Malignant neoplasm of unspecified part of right bronchus or lung: Secondary | ICD-10-CM

## 2024-08-13 DIAGNOSIS — F17200 Nicotine dependence, unspecified, uncomplicated: Secondary | ICD-10-CM

## 2024-08-13 DIAGNOSIS — R634 Abnormal weight loss: Secondary | ICD-10-CM | POA: Diagnosis not present

## 2024-08-13 DIAGNOSIS — F17210 Nicotine dependence, cigarettes, uncomplicated: Secondary | ICD-10-CM

## 2024-08-13 DIAGNOSIS — J9611 Chronic respiratory failure with hypoxia: Secondary | ICD-10-CM | POA: Diagnosis not present

## 2024-08-13 MED ORDER — TRELEGY ELLIPTA 100-62.5-25 MCG/ACT IN AEPB: 1.0000 | INHALATION_SPRAY | Freq: Every day | RESPIRATORY_TRACT | Status: AC

## 2024-08-13 NOTE — Progress Notes (Signed)
 [name]    [id number]    08/18/1961  Primary Care Physician:Burchette, Wolm ORN, MD Date of Appointment: 08/13/2024 Established Patient Visit  Chief complaint:   Chief Complaint  Patient presents with   COPD    Patient states breathing is good. Needs trelegy samples    HPI: [name] is a 63 y.o. woman with COPD and lung cancer s/p radiation, currently in surveillence. Also on home oxygen  4LNC pulse.  Interval Updates: Here for follow up.  No interval hospitalizations.  Needs trelegy refills.  Been out about 6 weeks  Still on trelegy inhaler 1 puff once daily.  She filled out paperwork for patience assistance and hasn't heard anything. She feels it is helping.  Also on ohtuvayre  which does help.   Still smoking a couple cigarettes a day.   Uses albuterol  inhaler as needed usually 2-3 times/day.  Losing weight, decreased appetite.    I have reviewed the patient's family social and past medical history and updated as appropriate.   Past Medical History:  Diagnosis Date   Arthritis    cervical spine   ASTHMA UNSPECIFIED WITH EXACERBATION 07/21/2010   CARPAL TUNNEL SYNDROME, BILATERAL 01/16/2008   COPD 05/08/2008   Dyspnea    intermittent. worsens with anxiety   GERD 04/18/2007   HELICOBACTER PYLORI GASTRITIS, HX OF 04/18/2007   PANIC DISORDER 04/18/2007   Pneumonia    yrs ago   TOBACCO ABUSE 07/17/2009    Past Surgical History:  Procedure Laterality Date   ANTERIOR CERVICAL DECOMP/DISCECTOMY FUSION N/A 11/19/2021   Procedure: CERVICAL FOUR-FIVE, CERVICAL FIVE-SIX, CERVICAL SIX-SEVEN ANTERIOR CERVICAL DECOMPRESSION/DISCECTOMY FUSION;  Surgeon: Joshua Alm RAMAN, MD;  Location: [facility] OR;  Service: Neurosurgery;  Laterality: N/A;   APPENDECTOMY     BRONCHIAL BIOPSY  01/23/2024   Procedure: BRONCHOSCOPY, WITH BIOPSY;  Surgeon: Shelah Lamar RAMAN, MD;  Location: [facility] ENDOSCOPY;  Service: Pulmonary;;   BRONCHIAL NEEDLE ASPIRATION BIOPSY  01/23/2024    Procedure: BRONCHOSCOPY, WITH NEEDLE ASPIRATION BIOPSY;  Surgeon: Shelah Lamar RAMAN, MD;  Location: [facility] ENDOSCOPY;  Service: Pulmonary;;   OOPHORECTOMY     POSTERIOR CERVICAL FUSION/FORAMINOTOMY N/A 03/25/2022   Procedure: Cervical Three-Cervical Seven Posterior Cervical Fusion with Lateral Mass Fixation;  Surgeon: Joshua Alm RAMAN, MD;  Location[facility] OR;  Service: Neurosurgery;  Laterality: N/A;    Family History  Problem Relation Age of Onset   Diabetes Mother    Cancer Father        lung smoke    Heart disease Father    Clotting disorder Brother    Breast cancer Paternal Aunt    Stomach cancer Paternal Uncle    Esophageal cancer Neg Hx    Colon cancer Neg Hx     Social History   Occupational History   Occupation: Scientist, Research (medical): SELF-EMPLOYED  Tobacco Use   Smoking status: Every Day    Current packs/day: 0.75    Average packs/day: 1.5 packs/day for 50.8 years (74.9 ttl pk-yrs)    Types: Cigarettes    Start date: 1975   Smokeless tobacco: Never   Tobacco comments:    3-5 cigarettes daily 08/13/2024  Vaping Use   Vaping status: Never Used  Substance and Sexual Activity   Alcohol use: Yes    Comment: occasional   Drug use: No   Sexual activity: Not Currently    Birth control/protection: Post-menopausal     Physical Exam: Blood pressure 97/67, pulse 93, temperature 98.5  F (36.9 C), temperature source Oral, height 5' 6 (1.676 m), weight 122 lb (55.3 kg), SpO2 93%.  Gen:     Thin, frail chronically ill appearing ENT: on nasal cannula Lungs:   diminished, no wheeze CV:         tachycardic, regular   Data Reviewed: Imaging: I have personally reviewed the CT Chest April 2025 shows moderate emphysema, RUL nodule  PFTs:     Latest Ref Rng & Units 06/07/2018    4:10 PM  PFT Results  FVC-Pre L 2.25   FVC-Predicted Pre % 64   Pre FEV1/FVC % % 57   FEV1-Pre L 1.28   FEV1-Predicted Pre % 47    I have personally reviewed the patient's PFTs and FEV1 47% of  predicted  Labs: Lab Results  Component Value Date   WBC 4.9 06/07/2024   HGB 12.4 06/07/2024   HCT 38.4 06/07/2024   MCV 91.6 06/07/2024   PLT 231 06/07/2024   Lab Results  Component Value Date   NA 136 06/07/2024   K 4.3 06/07/2024   CO2 35 (H) 06/07/2024   GLUCOSE 109 (H) 06/07/2024   BUN 10 06/07/2024   CREATININE 0.71 06/07/2024   CALCIUM  9.5 06/07/2024   GFR 84.03 07/31/2018   GFRNONAA >60 06/07/2024    Immunization status: Immunization History  Administered Date(s) Administered   Influenza Whole 09/24/2010, 06/18/2013   Influenza,inj,Quad PF,6+ Mos 08/06/2014, 08/08/2016, 08/23/2017, 06/20/2018, 07/09/2019, 08/31/2021   Pneumococcal Conjugate-13 09/14/2015   Tdap 11/16/2021    External Records Personally Reviewed: pulmonary, hospital stay  Assessment:  Severe COPD FEV1 47% of predicted Tobacco use disorder SCC of the lung s/p radiation therapy Chronic respiratory failure on 4L POC Chronic Pain   Plan/Recommendations: Congratulations on cutting back on smoking.  Keep up the good work I hope you quit completely.  Continue the trelegy inhaler 1 puff once a day, gargle after use.  Continue albuterol  as needed.   Focus on eating high calorie, nutrient dense foods. Shakes like ensure can also help with weight gain.   We have had you fill out the patient assistance paperwork twice now. I recommend following up with them if you have the number to call.   Return to Care: Return in about 3 months (around 11/13/2024) for Lauraine Lites.   Verdon Gore, MD Pulmonary and Critical Care Medicine [facility] Office:[phone]

## 2024-08-13 NOTE — Patient Instructions (Addendum)
 It was a pleasure to see you today!  Please schedule follow up with Lauraine Lites NP in 3 months.  If my schedule is not open yet, we will contact you with a reminder closer to that time. Please call [phone] if you haven't heard from us  a month before, and always call us  sooner if issues or concerns arise. You can also send us  a message through MyChart, but but aware that this is not to be used for urgent issues and it may take up to 5-7 days to receive a reply. Please be aware that you will likely be able to view your results before I have a chance to respond to them. Please give us  5 business days to respond to any non-urgent results.   Congratulations on cutting back on smoking.  Keep up the good work I hope you quit completely.  Continue the trelegy inhaler 1 puff once a day, gargle after use.  Continue albuterol  as needed.   Focus on eating high calorie, nutrient dense foods. Shakes like ensure can also help with weight gain.   We have had you fill out the patient assistance paperwork twice now. I recommend following up with them if you have the number to call.

## 2024-08-19 ENCOUNTER — Emergency Department (HOSPITAL_BASED_OUTPATIENT_CLINIC_OR_DEPARTMENT_OTHER)

## 2024-08-19 ENCOUNTER — Ambulatory Visit: Admitting: Family Medicine

## 2024-08-19 ENCOUNTER — Emergency Department (HOSPITAL_BASED_OUTPATIENT_CLINIC_OR_DEPARTMENT_OTHER)
Admission: EM | Admit: 2024-08-19 | Discharge: 2024-08-19 | Disposition: A | Discharge status: Home / Self Care | Attending: Emergency Medicine | Admitting: Emergency Medicine

## 2024-08-19 ENCOUNTER — Other Ambulatory Visit: Payer: Self-pay

## 2024-08-19 ENCOUNTER — Ambulatory Visit: Payer: Self-pay

## 2024-08-19 ENCOUNTER — Encounter (HOSPITAL_BASED_OUTPATIENT_CLINIC_OR_DEPARTMENT_OTHER): Payer: Self-pay | Admitting: Emergency Medicine

## 2024-08-19 DIAGNOSIS — K529 Noninfective gastroenteritis and colitis, unspecified: Secondary | ICD-10-CM | POA: Insufficient documentation

## 2024-08-19 DIAGNOSIS — Z85118 Personal history of other malignant neoplasm of bronchus and lung: Secondary | ICD-10-CM | POA: Insufficient documentation

## 2024-08-19 DIAGNOSIS — K8020 Calculus of gallbladder without cholecystitis without obstruction: Secondary | ICD-10-CM | POA: Diagnosis not present

## 2024-08-19 DIAGNOSIS — R197 Diarrhea, unspecified: Secondary | ICD-10-CM

## 2024-08-19 DIAGNOSIS — J449 Chronic obstructive pulmonary disease, unspecified: Secondary | ICD-10-CM | POA: Insufficient documentation

## 2024-08-19 DIAGNOSIS — Z720 Tobacco use: Secondary | ICD-10-CM | POA: Diagnosis not present

## 2024-08-19 DIAGNOSIS — R1013 Epigastric pain: Secondary | ICD-10-CM | POA: Diagnosis not present

## 2024-08-19 DIAGNOSIS — K8689 Other specified diseases of pancreas: Secondary | ICD-10-CM | POA: Diagnosis not present

## 2024-08-19 LAB — COMPREHENSIVE METABOLIC PANEL WITH GFR
ALT: <5 U/L (ref 0–44)
AST: 13 U/L — ABNORMAL LOW (ref 15–41)
Albumin: 4.2 g/dL (ref 3.5–5.0)
Alkaline Phosphatase: 72 U/L (ref 38–126)
Anion gap: 8 (ref 5–15)
BUN: 11 mg/dL (ref 8–23)
CO2: 31 mmol/L (ref 22–32)
Calcium: 9.8 mg/dL (ref 8.9–10.3)
Chloride: 97 mmol/L — ABNORMAL LOW (ref 98–111)
Creatinine, Ser: 0.51 mg/dL (ref 0.44–1.00)
GFR, Estimated: >60 mL/min (ref >60)
Glucose, Bld: 122 mg/dL — ABNORMAL HIGH (ref 70–99)
Potassium: 4.3 mmol/L (ref 3.5–5.1)
Sodium: 136 mmol/L (ref 135–145)
Total Bilirubin: 0.4 mg/dL (ref 0.0–1.2)
Total Protein: 7.1 g/dL (ref 6.5–8.1)

## 2024-08-19 LAB — CBC WITH DIFFERENTIAL/PLATELET
Abs Immature Granulocytes: 0.02 K/uL (ref 0.00–0.07)
Basophils Absolute: 0 K/uL (ref 0.0–0.1)
Basophils Relative: 0 %
Eosinophils Absolute: 0 K/uL (ref 0.0–0.5)
Eosinophils Relative: 1 %
HCT: 37 % (ref 36.0–46.0)
Hemoglobin: 12.2 g/dL (ref 12.0–15.0)
Immature Granulocytes: 0 %
Lymphocytes Relative: 14 %
Lymphs Abs: 0.9 K/uL (ref 0.7–4.0)
MCH: 30.8 pg (ref 26.0–34.0)
MCHC: 33 g/dL (ref 30.0–36.0)
MCV: 93.4 fL (ref 80.0–100.0)
Monocytes Absolute: 0.5 K/uL (ref 0.1–1.0)
Monocytes Relative: 7 %
Neutro Abs: 5 K/uL (ref 1.7–7.7)
Neutrophils Relative %: 78 %
Platelets: 179 K/uL (ref 150–400)
RBC: 3.96 MIL/uL (ref 3.87–5.11)
RDW: 13.2 % (ref 11.5–15.5)
WBC: 6.5 K/uL (ref 4.0–10.5)
nRBC: 0 % (ref 0.0–0.2)

## 2024-08-19 LAB — URINALYSIS, ROUTINE W REFLEX MICROSCOPIC
Bilirubin Urine: NEGATIVE
Glucose, UA: NEGATIVE mg/dL
Hgb urine dipstick: NEGATIVE
Ketones, ur: NEGATIVE mg/dL
Leukocytes,Ua: NEGATIVE
Nitrite: NEGATIVE
Protein, ur: NEGATIVE mg/dL
Specific Gravity, Urine: 1.013 (ref 1.005–1.030)
pH: 5.5 (ref 5.0–8.0)

## 2024-08-19 LAB — OCCULT BLOOD X 1 CARD TO LAB, STOOL: Fecal Occult Bld: NEGATIVE

## 2024-08-19 LAB — MAGNESIUM: Magnesium: 1.9 mg/dL (ref 1.7–2.4)

## 2024-08-19 LAB — LIPASE, BLOOD: Lipase: 14 U/L (ref 11–51)

## 2024-08-19 MED ORDER — CIPROFLOXACIN HCL 500 MG PO TABS
500.0000 mg | ORAL_TABLET | Freq: Once | ORAL | Status: AC
  Administered 2024-08-19: 500 mg via ORAL
  Filled 2024-08-19: qty 1

## 2024-08-19 MED ORDER — METRONIDAZOLE 500 MG PO TABS
500.0000 mg | ORAL_TABLET | Freq: Once | ORAL | Status: AC
  Administered 2024-08-19: 500 mg via ORAL
  Filled 2024-08-19: qty 1

## 2024-08-19 MED ORDER — CIPROFLOXACIN HCL 500 MG PO TABS: 500.0000 mg | ORAL_TABLET | Freq: Two times a day (BID) | ORAL | 0 refills | Status: AC

## 2024-08-19 MED ORDER — IOHEXOL 300 MG/ML  SOLN
80.0000 mL | Freq: Once | INTRAMUSCULAR | Status: AC | PRN
  Administered 2024-08-19: 80 mL via INTRAVENOUS

## 2024-08-19 MED ORDER — METRONIDAZOLE 500 MG PO TABS: 500.0000 mg | ORAL_TABLET | Freq: Two times a day (BID) | ORAL | 0 refills | Status: DC

## 2024-08-19 MED ORDER — ONDANSETRON 4 MG PO TBDP: 4.0000 mg | ORAL_TABLET | Freq: Three times a day (TID) | ORAL | 0 refills | Status: AC | PRN

## 2024-08-19 NOTE — Discharge Instructions (Addendum)
 You were seen for your colitis in the emergency department.   At home, please take the Cipro  and Flagyl for your colitis.  Take the Zofran  for any nausea or vomiting that you have.  Check your MyChart online for the results of any tests that had not resulted by the time you left the emergency department.   Follow-up with your primary doctor in 2-3 days regarding your visit.  Follow-up with the GI doctors about your colitis  Return immediately to the emergency department if you experience any of the following: Worsening abdominal pain, vomiting despite the medicine, or any other concerning symptoms.    Thank you for visiting our Emergency Department. It was a pleasure taking care of you today.

## 2024-08-19 NOTE — ED Provider Notes (Signed)
 [place] EMERGENCY DEPARTMENT AT [facility] Provider Note   CSN: [id number] Arrival date & time: 08/19/24  1516     Patient presents with: Diarrhea   [name] is a 63 y.o. female.  {Add pertinent medical, surgical, social history, OB history to HPI:4377} 63 year old female with a history of gallstones, tobacco use, COPD on 2 L nasal cannula at baseline, and lung cancer status post radiation presents to the emergency department with diarrhea and abdominal pain.  Symptoms present for 2 days.  Describes it as a cramping sensation.  Worsened with eating.  In her epigastrium and radiates to her right side into her back.  This is mild now.  Also has been having approximately 10 loose stools per day.  Did notice some bright red blood on them.  No melena.  Nausea but no vomiting.  No fevers.  No history of C. difficile.  No recent travel or antibiotics.  Granddaughter had E. coli a month ago.  Has had an appendectomy but no other abdominal surgeries.  Denies any urinary symptoms       Prior to Admission medications   Medication Sig Start Date End Date Taking? Authorizing Provider  acetaminophen  (TYLENOL ) 500 MG tablet Take 1,000 mg by mouth every 8 (eight) hours.    [provider]  albuterol  (VENTOLIN  HFA) 108 (90 Base) MCG/ACT inhaler INHALE 2 PUFFS INTO THE LUNGS EVERY 4 HOURS AS NEEDED FOR WHEEZING OR SHORTNESS OF BREATH. 07/12/24   Burchette, Wolm ORN, MD  ALPRAZolam  (XANAX ) 1 MG tablet Take 1 mg by mouth 3 (three) times daily. 12/18/23   [provider]  cyclobenzaprine  (FLEXERIL ) 10 MG tablet Take 10 mg by mouth 2 (two) times daily as needed for muscle spasms. Patient not taking: Reported on 08/13/2024    [provider]  Ensifentrine  (OHTUVAYRE ) 3 MG/2.5ML SUSP Inhale 1 ampule into the lungs 2 (two) times daily. 02/13/24   Ruthell Lauraine FALCON, NP  Fluticasone -Umeclidin-Vilant (TRELEGY ELLIPTA ) 100-62.5-25 MCG/ACT AEPB Inhale 1 puff into the lungs daily  at 2 PM. 01/17/24   Ruthell Lauraine FALCON, NP  Fluticasone -Umeclidin-Vilant (TRELEGY ELLIPTA ) 100-62.5-25 MCG/ACT AEPB Inhale 1 puff into the lungs daily. 08/13/24   [name], [name], MD  Fluticasone -Umeclidin-Vilant (TRELEGY ELLIPTA ) 200-62.5-25 MCG/ACT AEPB Inhale 1 puff into the lungs daily at 2 PM. Patient not taking: Reported on 08/13/2024 02/13/24   Ruthell Lauraine FALCON, NP  Fluticasone -Umeclidin-Vilant (TRELEGY ELLIPTA ) 200-62.5-25 MCG/ACT AEPB Inhale 1 puff into the lungs daily. Patient not taking: Reported on 08/13/2024 05/10/24   Ruthell Lauraine FALCON, NP  guaiFENesin -codeine  (GUAIATUSSIN AC) 100-10 MG/5ML syrup Take 5 mLs by mouth every 4 (four) hours as needed for cough. Take 5 mLs by mouth every 4 (four) hours as needed for cough or to loosen phlegm. 02/13/24   Ruthell Lauraine FALCON, NP  ipratropium-albuterol  (DUONEB) 0.5-2.5 (3) MG/3ML SOLN INHALE 3 ML BY NEBULIZER EVERY 6 HOURS AS NEEDED 05/02/24   [name], [name], MD  nicotine  (NICODERM CQ  - DOSED IN MG/24 HOURS) 21 mg/24hr patch Place 1 patch (21 mg total) onto the skin daily. 12/29/23   Rojelio Nest, DO  Oxycodone  HCl 10 MG TABS Take 10 mg by mouth in the morning and at bedtime.    [provider]  pantoprazole  (PROTONIX ) 40 MG tablet Take 1 tablet (40 mg total) by mouth at bedtime. 02/09/24   [name], [name], DO    Allergies: Aspirin    Review of Systems  Updated Vital Signs BP 112/81 (BP Location: Left Arm)  Pulse (!) 107   Temp 98.8 F (37.1 C)   Resp 19   SpO2 96%   Physical Exam Vitals and nursing note reviewed.  Constitutional:      General: She is not in acute distress.    Appearance: She is well-developed.  HENT:     Head: Normocephalic and atraumatic.     Right Ear: External ear normal.     Left Ear: External ear normal.     Nose: Nose normal.  Eyes:     Extraocular Movements: Extraocular movements intact.     Conjunctiva/sclera: Conjunctivae normal.     Pupils: Pupils are equal, round, and reactive to light.  Pulmonary:      Effort: Pulmonary effort is normal. No respiratory distress.     Comments: On home 2 L nasal cannula Abdominal:     General: Abdomen is flat. There is no distension.     Palpations: Abdomen is soft. There is no mass.     Tenderness: There is abdominal tenderness (Epigastrium and right upper quadrant.  Negative Murphy sign). There is no guarding.  Musculoskeletal:     Cervical back: Normal range of motion and neck supple.  Skin:    General: Skin is warm and dry.  Neurological:     Mental Status: She is alert.  Psychiatric:        Mood and Affect: Mood normal.     (all labs ordered are listed, but only abnormal results are displayed) Labs Reviewed  COMPREHENSIVE METABOLIC PANEL WITH GFR  LIPASE, BLOOD  CBC WITH DIFFERENTIAL/PLATELET  URINALYSIS, ROUTINE W REFLEX MICROSCOPIC  MAGNESIUM     EKG: None  Radiology: No results found.  {Document cardiac monitor, telemetry assessment procedure when appropriate:32947} Procedures   Medications Ordered in the ED - No data to display    {Click here for ABCD2, HEART and other calculators REFRESH Note before signing:1}                              Medical Decision Making Amount and/or Complexity of Data Reviewed Labs: ordered. Radiology: ordered.   ***  {Document critical care time when appropriate  Document review of labs and clinical decision tools ie CHADS2VASC2, etc  Document your independent review of radiology images and any outside records  Document your discussion with family members, caretakers and with consultants  Document social determinants of health affecting pt's care  Document your decision making why or why not admission, treatments were needed:32947:::1}   Final diagnoses:  None    ED Discharge Orders     None

## 2024-08-19 NOTE — ED Triage Notes (Signed)
 Diarrhea x 2  Abdo pain, epigastric to the right side and into back

## 2024-08-19 NOTE — Telephone Encounter (Signed)
 FYI Only or Action Required?: FYI only for provider: appointment scheduled on 11/10.  Patient was last seen in primary care on 01/01/2024 by Micheal Wolm ORN, MD.  Called Nurse Triage reporting Abdominal Pain.  Symptoms began x 2 days.  Interventions attempted: Rest, hydration, or home remedies.  Symptoms are: gradually worsening.  Triage Disposition: See HCP Within 4 Hours (Or PCP Triage)  Patient/caregiver understands and will follow disposition?: Yes           Copied from CRM [id number]. Topic: Clinical - Red Word Triage >> Aug 19, 2024  1:57 PM Brittany M wrote: Red Word that prompted transfer to Nurse Triage: Patient daughter Alberta calling with Mother -  scans done a few months ago-having pain in stomach- has gallstones. Sick for 2 days- diarrhea, throwing up. Reason for Disposition  [1] MILD-MODERATE pain AND [2] constant AND [3] present > 2 hours  Answer Assessment - Initial Assessment Questions 1. LOCATION: Where does it hurt?      Right, mid abdomen   2. RADIATION: Does the pain shoot anywhere else? (e.g., chest, back)     Back   3. ONSET: When did the pain begin? (e.g., minutes, hours or days ago)      X 2 days   4. SUDDEN: Gradual or sudden onset?     Sudden   5. PATTERN Does the pain come and go, or is it constant?     Constant since yesterday   6. SEVERITY: How bad is the pain?  (e.g., Scale 1-10; mild, moderate, or severe)     Moderate-Severe at times   7. RECURRENT SYMPTOM: Have you ever had this type of stomach pain before? If Yes, ask: When was the last time? and What happened that time?      No   8. CAUSE: What do you think is causing the stomach pain? (e.g., gallstones, recent abdominal surgery)      Unsure, pt. Has Lung CA. Daughter stated she has gallstones, and no provider has reached out to discuss the findings.     9. RELIEVING/AGGRAVATING FACTORS: What makes it better or worse? (e.g., antacids, bending or  twisting motion, bowel movement)     Nothing   10. OTHER SYMPTOMS: Do you have any other symptoms? (e.g., back pain, diarrhea, fever, urination pain, vomiting)     Diarrhea, pt. Has has had several loose stool in the past 24 hours. Blood in stool x 1 time this AM. Nausea noted. Appointment scheduled for evaluation. Patient's daughter agrees with plan of care, and will call back if anything changes, or if symptoms worsen.  Protocols used: Abdominal Pain - Female-A-AH

## 2024-08-19 NOTE — ED Notes (Signed)
 Patient is aware of the need for a stool sample, but unable to provide one at this time.

## 2024-08-19 NOTE — ED Notes (Signed)
 Patient is aware of the need for a urine sample, but unable to provide one at this time.

## 2024-09-02 DIAGNOSIS — M5412 Radiculopathy, cervical region: Secondary | ICD-10-CM | POA: Diagnosis not present

## 2024-09-02 DIAGNOSIS — M961 Postlaminectomy syndrome, not elsewhere classified: Secondary | ICD-10-CM | POA: Diagnosis not present

## 2024-09-02 DIAGNOSIS — G894 Chronic pain syndrome: Secondary | ICD-10-CM | POA: Diagnosis not present

## 2024-09-02 DIAGNOSIS — G8929 Other chronic pain: Secondary | ICD-10-CM | POA: Diagnosis not present

## 2024-09-02 DIAGNOSIS — Z79891 Long term (current) use of opiate analgesic: Secondary | ICD-10-CM | POA: Diagnosis not present

## 2024-09-05 DIAGNOSIS — T1490XA Injury, unspecified, initial encounter: Secondary | ICD-10-CM | POA: Diagnosis not present

## 2024-09-09 DIAGNOSIS — F410 Panic disorder [episodic paroxysmal anxiety] without agoraphobia: Secondary | ICD-10-CM | POA: Diagnosis not present

## 2024-09-09 DIAGNOSIS — F32A Depression, unspecified: Secondary | ICD-10-CM | POA: Diagnosis not present

## 2024-09-09 DIAGNOSIS — F411 Generalized anxiety disorder: Secondary | ICD-10-CM | POA: Diagnosis not present

## 2024-09-10 DIAGNOSIS — T1490XA Injury, unspecified, initial encounter: Secondary | ICD-10-CM | POA: Diagnosis not present

## 2024-09-11 ENCOUNTER — Other Ambulatory Visit: Payer: Self-pay | Admitting: Family Medicine

## 2024-09-13 ENCOUNTER — Ambulatory Visit: Admitting: Family Medicine

## 2024-09-13 ENCOUNTER — Encounter: Payer: Self-pay | Admitting: Family Medicine

## 2024-09-13 VITALS — BP 90/60 | HR 99 | Temp 97.9°F | Wt 120.9 lb

## 2024-09-13 DIAGNOSIS — Z23 Encounter for immunization: Secondary | ICD-10-CM | POA: Diagnosis not present

## 2024-09-13 DIAGNOSIS — M542 Cervicalgia: Secondary | ICD-10-CM

## 2024-09-13 DIAGNOSIS — I700 Atherosclerosis of aorta: Secondary | ICD-10-CM | POA: Diagnosis not present

## 2024-09-13 DIAGNOSIS — K5792 Diverticulitis of intestine, part unspecified, without perforation or abscess without bleeding: Secondary | ICD-10-CM | POA: Diagnosis not present

## 2024-09-13 DIAGNOSIS — J449 Chronic obstructive pulmonary disease, unspecified: Secondary | ICD-10-CM | POA: Diagnosis not present

## 2024-09-13 DIAGNOSIS — C3411 Malignant neoplasm of upper lobe, right bronchus or lung: Secondary | ICD-10-CM | POA: Diagnosis not present
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
## 2024-09-16 ENCOUNTER — Other Ambulatory Visit

## 2024-09-17 ENCOUNTER — Other Ambulatory Visit

## 2024-09-17 ENCOUNTER — Ambulatory Visit

## 2024-09-17 ENCOUNTER — Ambulatory Visit: Payer: Self-pay | Admitting: Family Medicine

## 2024-09-17 DIAGNOSIS — M542 Cervicalgia: Secondary | ICD-10-CM

## 2024-09-18 ENCOUNTER — Telehealth: Payer: Self-pay

## 2024-09-18 NOTE — Telephone Encounter (Signed)
 Patient informed of the message below and bone density has been ordered.

## 2024-09-18 NOTE — Addendum Note (Signed)
 Addended by: METTA KRISTEN CROME on: 09/18/2024 03:34 PM   Modules accepted: Orders

## 2024-09-18 NOTE — Telephone Encounter (Signed)
 Please see result note

## 2024-09-18 NOTE — Telephone Encounter (Signed)
 Copied from CRM #[id number]. Topic: General - Other >> Sep 18, 2024  1:25 PM Mercedes MATSU wrote: Reason for CRM: Patient had a missed call from [facility] and is requesting a call back and can be reached at [phone].

## 2024-09-18 NOTE — Telephone Encounter (Signed)
 Copied from CRM #[id number]. Topic: Clinical - Lab/Test Results >> Sep 18, 2024  3:18 PM [name] wrote: Reason for CRM: Pt called in to go over results. Relayed message regarding calcium  and vitamin D intake. Pt is interested in having a  DEXA Scan performed. Please place order. Advise pt on #[phone]

## 2024-09-18 NOTE — Telephone Encounter (Signed)
 Copied from CRM #[id number]. Topic: Clinical - Lab/Test Results >> Sep 18, 2024  3:11 PM Larissa S wrote: Reason for CRM: Att: Ocie Tino - Patient returning call, requested a callback

## 2024-09-25 ENCOUNTER — Inpatient Hospital Stay: Admission: RE | Admit: 2024-09-25 | Discharge: 2024-09-25 | Attending: Family Medicine | Admitting: Family Medicine

## 2024-09-25 DIAGNOSIS — Q782 Osteopetrosis: Secondary | ICD-10-CM

## 2024-09-25 DIAGNOSIS — M810 Age-related osteoporosis without current pathological fracture: Secondary | ICD-10-CM | POA: Diagnosis not present

## 2024-09-27 ENCOUNTER — Other Ambulatory Visit: Payer: Self-pay | Admitting: Family Medicine

## 2024-09-27 DIAGNOSIS — J441 Chronic obstructive pulmonary disease with (acute) exacerbation: Secondary | ICD-10-CM

## 2024-09-29 ENCOUNTER — Ambulatory Visit: Payer: Self-pay | Admitting: Family Medicine

## 2024-09-29 DIAGNOSIS — M810 Age-related osteoporosis without current pathological fracture: Secondary | ICD-10-CM

## 2024-09-29 HISTORY — DX: Age-related osteoporosis without current pathological fracture: M81.0

## 2024-09-30 ENCOUNTER — Telehealth: Payer: Self-pay | Admitting: *Deleted

## 2024-09-30 MED ORDER — ALENDRONATE SODIUM 70 MG PO TABS: 70.0000 mg | ORAL_TABLET | ORAL | 2 refills | Status: AC

## 2024-09-30 NOTE — Telephone Encounter (Signed)
 Copied from CRM #[id number]. Topic: Clinical - Medical Advice >> Sep 30, 2024 11:16 AM Chasity T wrote: Reason for CRM: patient is wanting to know if PCP can take her her pain control needs. States [facility] but it is currently not working out for her. She is there for hours at a time and needs better care. Please contact her for further discussion.

## 2024-09-30 NOTE — Telephone Encounter (Signed)
 Patient informed.

## 2024-10-05 DIAGNOSIS — T1490XA Injury, unspecified, initial encounter: Secondary | ICD-10-CM | POA: Diagnosis not present

## 2024-10-06 ENCOUNTER — Encounter (HOSPITAL_COMMUNITY): Payer: Self-pay | Admitting: Emergency Medicine

## 2024-10-06 ENCOUNTER — Emergency Department (HOSPITAL_COMMUNITY)

## 2024-10-06 ENCOUNTER — Inpatient Hospital Stay (HOSPITAL_COMMUNITY)
Admission: EM | Admit: 2024-10-06 | Discharge: 2024-10-11 | DRG: 189 | Disposition: A | Discharge status: Home Health | Attending: Family Medicine | Admitting: Family Medicine

## 2024-10-06 ENCOUNTER — Other Ambulatory Visit: Payer: Self-pay

## 2024-10-06 DIAGNOSIS — J439 Emphysema, unspecified: Secondary | ICD-10-CM | POA: Diagnosis present

## 2024-10-06 DIAGNOSIS — Z981 Arthrodesis status: Secondary | ICD-10-CM

## 2024-10-06 DIAGNOSIS — C3411 Malignant neoplasm of upper lobe, right bronchus or lung: Secondary | ICD-10-CM | POA: Diagnosis present

## 2024-10-06 DIAGNOSIS — Z833 Family history of diabetes mellitus: Secondary | ICD-10-CM | POA: Diagnosis not present

## 2024-10-06 DIAGNOSIS — K219 Gastro-esophageal reflux disease without esophagitis: Secondary | ICD-10-CM | POA: Diagnosis present

## 2024-10-06 DIAGNOSIS — Z1152 Encounter for screening for COVID-19: Secondary | ICD-10-CM

## 2024-10-06 DIAGNOSIS — F419 Anxiety disorder, unspecified: Secondary | ICD-10-CM | POA: Diagnosis not present

## 2024-10-06 DIAGNOSIS — Z7951 Long term (current) use of inhaled steroids: Secondary | ICD-10-CM

## 2024-10-06 DIAGNOSIS — F410 Panic disorder [episodic paroxysmal anxiety] without agoraphobia: Secondary | ICD-10-CM | POA: Diagnosis present

## 2024-10-06 DIAGNOSIS — G8929 Other chronic pain: Secondary | ICD-10-CM | POA: Diagnosis present

## 2024-10-06 DIAGNOSIS — Z800 Family history of malignant neoplasm of digestive organs: Secondary | ICD-10-CM

## 2024-10-06 DIAGNOSIS — Z9981 Dependence on supplemental oxygen: Secondary | ICD-10-CM | POA: Diagnosis not present

## 2024-10-06 DIAGNOSIS — J441 Chronic obstructive pulmonary disease with (acute) exacerbation: Secondary | ICD-10-CM | POA: Diagnosis present

## 2024-10-06 DIAGNOSIS — J440 Chronic obstructive pulmonary disease with acute lower respiratory infection: Secondary | ICD-10-CM | POA: Diagnosis present

## 2024-10-06 DIAGNOSIS — I4581 Long QT syndrome: Secondary | ICD-10-CM | POA: Diagnosis present

## 2024-10-06 DIAGNOSIS — R9431 Abnormal electrocardiogram [ECG] [EKG]: Secondary | ICD-10-CM | POA: Diagnosis not present

## 2024-10-06 DIAGNOSIS — M5412 Radiculopathy, cervical region: Secondary | ICD-10-CM | POA: Diagnosis present

## 2024-10-06 DIAGNOSIS — D696 Thrombocytopenia, unspecified: Secondary | ICD-10-CM | POA: Diagnosis present

## 2024-10-06 DIAGNOSIS — J9622 Acute and chronic respiratory failure with hypercapnia: Secondary | ICD-10-CM | POA: Diagnosis present

## 2024-10-06 DIAGNOSIS — R7303 Prediabetes: Secondary | ICD-10-CM | POA: Diagnosis present

## 2024-10-06 DIAGNOSIS — Z7983 Long term (current) use of bisphosphonates: Secondary | ICD-10-CM

## 2024-10-06 DIAGNOSIS — Z8249 Family history of ischemic heart disease and other diseases of the circulatory system: Secondary | ICD-10-CM | POA: Diagnosis not present

## 2024-10-06 DIAGNOSIS — J209 Acute bronchitis, unspecified: Secondary | ICD-10-CM | POA: Diagnosis present

## 2024-10-06 DIAGNOSIS — M810 Age-related osteoporosis without current pathological fracture: Secondary | ICD-10-CM | POA: Diagnosis present

## 2024-10-06 DIAGNOSIS — Z803 Family history of malignant neoplasm of breast: Secondary | ICD-10-CM

## 2024-10-06 DIAGNOSIS — T819XXA Unspecified complication of procedure, initial encounter: Secondary | ICD-10-CM | POA: Insufficient documentation

## 2024-10-06 DIAGNOSIS — D649 Anemia, unspecified: Secondary | ICD-10-CM | POA: Diagnosis present

## 2024-10-06 DIAGNOSIS — Z832 Family history of diseases of the blood and blood-forming organs and certain disorders involving the immune mechanism: Secondary | ICD-10-CM

## 2024-10-06 DIAGNOSIS — F17210 Nicotine dependence, cigarettes, uncomplicated: Secondary | ICD-10-CM | POA: Diagnosis present

## 2024-10-06 DIAGNOSIS — R0602 Shortness of breath: Secondary | ICD-10-CM | POA: Diagnosis present

## 2024-10-06 DIAGNOSIS — F17200 Nicotine dependence, unspecified, uncomplicated: Secondary | ICD-10-CM | POA: Diagnosis present

## 2024-10-06 DIAGNOSIS — J9621 Acute and chronic respiratory failure with hypoxia: Secondary | ICD-10-CM | POA: Diagnosis present

## 2024-10-06 DIAGNOSIS — Z79899 Other long term (current) drug therapy: Secondary | ICD-10-CM

## 2024-10-06 DIAGNOSIS — E785 Hyperlipidemia, unspecified: Secondary | ICD-10-CM | POA: Diagnosis present

## 2024-10-06 LAB — I-STAT CG4 LACTIC ACID, ED
Lactic Acid, Venous: 1.1 mmol/L (ref 0.5–1.9)
Lactic Acid, Venous: 1.7 mmol/L (ref 0.5–1.9)

## 2024-10-06 LAB — PROCALCITONIN: Procalcitonin: <0.1 ng/mL

## 2024-10-06 LAB — CBC WITH DIFFERENTIAL/PLATELET
Abs Immature Granulocytes: 0.05 K/uL (ref 0.00–0.07)
Basophils Absolute: 0 K/uL (ref 0.0–0.1)
Basophils Relative: 0 %
Eosinophils Absolute: 0.1 K/uL (ref 0.0–0.5)
Eosinophils Relative: 1 %
HCT: 34.7 % — ABNORMAL LOW (ref 36.0–46.0)
Hemoglobin: 10.7 g/dL — ABNORMAL LOW (ref 12.0–15.0)
Immature Granulocytes: 1 %
Lymphocytes Relative: 11 %
Lymphs Abs: 1.1 K/uL (ref 0.7–4.0)
MCH: 30.1 pg (ref 26.0–34.0)
MCHC: 30.8 g/dL (ref 30.0–36.0)
MCV: 97.7 fL (ref 80.0–100.0)
Monocytes Absolute: 0.7 K/uL (ref 0.1–1.0)
Monocytes Relative: 7 %
Neutro Abs: 8.4 K/uL — ABNORMAL HIGH (ref 1.7–7.7)
Neutrophils Relative %: 80 %
Platelets: 158 K/uL (ref 150–400)
RBC: 3.55 MIL/uL — ABNORMAL LOW (ref 3.87–5.11)
RDW: 13.2 % (ref 11.5–15.5)
WBC: 10.2 K/uL (ref 4.0–10.5)
nRBC: 0 % (ref 0.0–0.2)

## 2024-10-06 LAB — COMPREHENSIVE METABOLIC PANEL WITH GFR
ALT: 20 U/L (ref 0–44)
AST: 22 U/L (ref 15–41)
Albumin: 3.8 g/dL (ref 3.5–5.0)
Alkaline Phosphatase: 141 U/L — ABNORMAL HIGH (ref 38–126)
Anion gap: 8 (ref 5–15)
BUN: 14 mg/dL (ref 8–23)
CO2: 34 mmol/L — ABNORMAL HIGH (ref 22–32)
Calcium: 9.2 mg/dL (ref 8.9–10.3)
Chloride: 97 mmol/L — ABNORMAL LOW (ref 98–111)
Creatinine, Ser: 0.59 mg/dL (ref 0.44–1.00)
GFR, Estimated: >60 mL/min
Glucose, Bld: 188 mg/dL — ABNORMAL HIGH (ref 70–99)
Potassium: 3.9 mmol/L (ref 3.5–5.1)
Sodium: 139 mmol/L (ref 135–145)
Total Bilirubin: 0.3 mg/dL (ref 0.0–1.2)
Total Protein: 7.1 g/dL (ref 6.5–8.1)

## 2024-10-06 LAB — RESP PANEL BY RT-PCR (RSV, FLU A&B, COVID)  RVPGX2
Influenza A by PCR: NEGATIVE
Influenza B by PCR: NEGATIVE
Resp Syncytial Virus by PCR: NEGATIVE
SARS Coronavirus 2 by RT PCR: NEGATIVE

## 2024-10-06 LAB — BLOOD GAS, VENOUS
Acid-Base Excess: 9.3 mmol/L — ABNORMAL HIGH (ref 0.0–2.0)
Acid-Base Excess: 6.3 mmol/L — ABNORMAL HIGH (ref 0.0–2.0)
Bicarbonate: 39 mmol/L — ABNORMAL HIGH (ref 20.0–28.0)
Bicarbonate: 34.4 mmol/L — ABNORMAL HIGH (ref 20.0–28.0)
O2 Saturation: 65.1 %
O2 Saturation: 97.1 %
Patient temperature: 37
Patient temperature: 37
pCO2, Ven: 85 mmHg (ref 44–60)
pCO2, Ven: 70 mmHg — ABNORMAL HIGH (ref 44–60)
pH, Ven: 7.27 (ref 7.25–7.43)
pH, Ven: 7.3 (ref 7.25–7.43)
pO2, Ven: 37 mmHg (ref 32–45)
pO2, Ven: 69 mmHg — ABNORMAL HIGH (ref 32–45)

## 2024-10-06 LAB — RESPIRATORY PANEL BY PCR

## 2024-10-06 LAB — I-STAT CHEM 8, ED
BUN: 15 mg/dL (ref 8–23)
Calcium, Ion: 1.14 mmol/L — ABNORMAL LOW (ref 1.15–1.40)
Chloride: 95 mmol/L — ABNORMAL LOW (ref 98–111)
Creatinine, Ser: 0.7 mg/dL (ref 0.44–1.00)
Glucose, Bld: 177 mg/dL — ABNORMAL HIGH (ref 70–99)
HCT: 34 % — ABNORMAL LOW (ref 36.0–46.0)
Hemoglobin: 11.6 g/dL — ABNORMAL LOW (ref 12.0–15.0)
Potassium: 3.7 mmol/L (ref 3.5–5.1)
Sodium: 136 mmol/L (ref 135–145)
TCO2: 35 mmol/L — ABNORMAL HIGH (ref 22–32)

## 2024-10-06 LAB — PHOSPHORUS: Phosphorus: 2.4 mg/dL — ABNORMAL LOW (ref 2.5–4.6)

## 2024-10-06 LAB — TROPONIN T, HIGH SENSITIVITY
Troponin T High Sensitivity: <15 ng/L (ref 0–19)
Troponin T High Sensitivity: <15 ng/L (ref 0–19)

## 2024-10-06 LAB — MRSA NEXT GEN BY PCR, NASAL: MRSA by PCR Next Gen: NOT DETECTED

## 2024-10-06 MED ORDER — CHLORHEXIDINE GLUCONATE CLOTH 2 % EX PADS
6.0000 | MEDICATED_PAD | Freq: Every day | CUTANEOUS | Status: DC
  Administered 2024-10-06 – 2024-10-09 (×2): 6 via TOPICAL

## 2024-10-06 MED ORDER — ONDANSETRON HCL 4 MG/2ML IJ SOLN: 4.0000 mg | Freq: Four times a day (QID) | INTRAMUSCULAR | Status: DC | PRN

## 2024-10-06 MED ORDER — OXYCODONE HCL 5 MG PO TABS
5.0000 mg | ORAL_TABLET | ORAL | Status: AC | PRN
  Administered 2024-10-06 – 2024-10-07 (×4): 5 mg via ORAL
  Filled 2024-10-06: qty 1

## 2024-10-06 MED ORDER — SODIUM CHLORIDE 0.9 % IV SOLN
100.0000 mg | Freq: Two times a day (BID) | INTRAVENOUS | Status: DC
  Administered 2024-10-06 – 2024-10-07 (×2): 100 mg via INTRAVENOUS
  Filled 2024-10-06 (×4): qty 100

## 2024-10-06 MED ORDER — ALBUTEROL SULFATE (2.5 MG/3ML) 0.083% IN NEBU
5.0000 mg | INHALATION_SOLUTION | Freq: Once | RESPIRATORY_TRACT | Status: AC
  Administered 2024-10-06: 5 mg via RESPIRATORY_TRACT
  Filled 2024-10-06: qty 6

## 2024-10-06 MED ORDER — ACETAMINOPHEN 650 MG RE SUPP: 650.0000 mg | Freq: Four times a day (QID) | RECTAL | Status: DC | PRN

## 2024-10-06 MED ORDER — PREDNISONE 20 MG PO TABS
30.0000 mg | ORAL_TABLET | Freq: Every day | ORAL | Status: DC
  Administered 2024-10-07: 30 mg via ORAL
  Filled 2024-10-06: qty 1

## 2024-10-06 MED ORDER — SODIUM CHLORIDE 0.9 % IV SOLN
1.0000 g | Freq: Once | INTRAVENOUS | Status: AC
  Administered 2024-10-06: 1 g via INTRAVENOUS
  Filled 2024-10-06: qty 10

## 2024-10-06 MED ORDER — ALPRAZOLAM 0.5 MG PO TABS
1.0000 mg | ORAL_TABLET | Freq: Three times a day (TID) | ORAL | Status: DC | PRN
  Administered 2024-10-06 – 2024-10-11 (×14): 1 mg via ORAL
  Filled 2024-10-06: qty 2

## 2024-10-06 MED ORDER — PANTOPRAZOLE SODIUM 40 MG PO TBEC
40.0000 mg | DELAYED_RELEASE_TABLET | Freq: Every day | ORAL | Status: DC
  Administered 2024-10-06 – 2024-10-10 (×5): 40 mg via ORAL
  Filled 2024-10-06: qty 1

## 2024-10-06 MED ORDER — POTASSIUM CHLORIDE CRYS ER 20 MEQ PO TBCR
20.0000 meq | EXTENDED_RELEASE_TABLET | Freq: Once | ORAL | Status: AC
  Administered 2024-10-06: 20 meq via ORAL
  Filled 2024-10-06: qty 1

## 2024-10-06 MED ORDER — SODIUM CHLORIDE 0.9 % IV BOLUS
500.0000 mL | Freq: Once | INTRAVENOUS | Status: AC
  Administered 2024-10-06: 500 mL via INTRAVENOUS

## 2024-10-06 MED ORDER — ACETAMINOPHEN 325 MG PO TABS
650.0000 mg | ORAL_TABLET | Freq: Four times a day (QID) | ORAL | Status: DC | PRN
  Administered 2024-10-09 (×2): 650 mg via ORAL

## 2024-10-06 MED ORDER — SODIUM CHLORIDE 0.9 % IV SOLN
100.0000 mg | Freq: Once | INTRAVENOUS | Status: AC
  Administered 2024-10-06: 100 mg via INTRAVENOUS
  Filled 2024-10-06: qty 100

## 2024-10-06 MED ORDER — SODIUM CHLORIDE 0.9 % IV SOLN: 500.0000 mg | Freq: Once | INTRAVENOUS | Status: DC

## 2024-10-06 MED ORDER — LORAZEPAM 2 MG/ML IJ SOLN
0.5000 mg | Freq: Once | INTRAMUSCULAR | Status: AC
  Administered 2024-10-06: 0.5 mg via INTRAVENOUS
  Filled 2024-10-06: qty 1

## 2024-10-06 MED ORDER — ALBUTEROL SULFATE (2.5 MG/3ML) 0.083% IN NEBU
10.0000 mg/h | INHALATION_SOLUTION | RESPIRATORY_TRACT | Status: DC
  Administered 2024-10-06: 10 mg/h via RESPIRATORY_TRACT
  Filled 2024-10-06: qty 12

## 2024-10-06 MED ORDER — ACETAMINOPHEN 650 MG RE SUPP
650.0000 mg | Freq: Once | RECTAL | Status: AC
  Administered 2024-10-06: 650 mg via RECTAL
  Filled 2024-10-06: qty 1

## 2024-10-06 MED ORDER — SODIUM CHLORIDE 0.9 % IV SOLN
1.0000 g | INTRAVENOUS | Status: DC
  Administered 2024-10-07 – 2024-10-09 (×3): 1 g via INTRAVENOUS
  Filled 2024-10-06: qty 10

## 2024-10-06 MED ORDER — ONDANSETRON HCL 4 MG PO TABS: 4.0000 mg | ORAL_TABLET | Freq: Four times a day (QID) | ORAL | Status: DC | PRN

## 2024-10-06 NOTE — Progress Notes (Signed)
 RT took pt off BIPAP and placed on 6 LPM [place]. Pt doing well at this time.

## 2024-10-06 NOTE — H&P (Signed)
 " History and Physical    Patient: [name] DOB: 05/07/1961 DOA: 10/06/2024 DOS: the patient was seen and examined on 10/06/2024 PCP: Micheal Wolm ORN, MD  Patient coming from: Home  Chief Complaint:  Chief Complaint  Patient presents with   Respiratory Distress   HPI: [name] is a 63 y.o. female with medical history significant of osteoarthritis, carpal tunnel syndrome, GERD, gastritis, H. pylori positive, hyperglycemia, dyslipidemia, depression, anxiety, panic disorder, migraine headaches, osteoporosis, history of multifocal pneumonia, stage Ib non-small cell lung cancer with status post SBRT to the right upper lobe on March 18, 2024 who presented to the emergency department BIBEMS due to progressively worse dyspnea associated with occasional productive cough of yellowish sputum, wheezing, fatigue, malaise and decreased appetite.  Oxygen  saturation decreased to 70% on room air at home.  EMS gave supplemental oxygen , albuterol  10+ ipratropium 1 mg continuous neb, magnesium  sulfate 2 g IVPB and Solu-Medrol  to 25 mg IVPB.  She denied hemoptysis.  No chest pain, palpitations, diaphoresis, PND, orthopnea or pitting edema of the lower extremities.  No abdominal pain, nausea, emesis, diarrhea, melena or hematochezia.  No flank pain, dysuria, frequency or hematuria.  No polyuria, polydipsia, polyphagia or blurred vision.   Lab work: CBC showed a white count of 10.2, hemoglobin 10.7 g/dL platelets 841.  Troponin x 2 and lactic acid x 2 were normal.  Venous blood gas showed pH of 7.27, pCO2 of 85 and pO2 of 37 mmHg, HCO3 39.0 and acid base excess 9.3 mmol/L.  Follow-up venous blood gas showed a pH of 7.3, pCO2 of 70 and pO2 of 69 mmHg, HCO3 of 34.4 and acid base excess 6.3 mmol/L. CMP showed a glucose of 188, alkaline phosphatase 141 units/L, chloride of 97 and CO2 of 34 mmol/L with a normal anion gap.  Renal function, the rest of the hepatic functions and the rest of the  electrolytes were normal.  Imaging: Portable 1 view chest radiograph showing emphysema with no active disease.  ED course: Initial vital signs were temperature 102.5 F, pulse 138, respirations 22, BP 121/62 mmHg and O2 sat 100% on aerosol mask.  The patient was subsequently placed on BiPAP for about 4 hours and then was placed on nasal cannula 6 LPM.  She also received acetaminophen  650 mg PR x 1, albuterol  5 mg continuous neb, lorazepam  0.5 mg IVP, normal saline 500 mL bolus x 2, ceftriaxone  1 g IVPB and doxycycline  100 mg IVPB.   Review of Systems: As mentioned in the history of present illness. All other systems reviewed and are negative. Past Medical History:  Diagnosis Date   Arthritis    cervical spine   ASTHMA UNSPECIFIED WITH EXACERBATION 07/21/2010   CARPAL TUNNEL SYNDROME, BILATERAL 01/16/2008   COPD 05/08/2008   Dyspnea    intermittent. worsens with anxiety   GERD 04/18/2007   HELICOBACTER PYLORI GASTRITIS, HX OF 04/18/2007   PANIC DISORDER 04/18/2007   Pneumonia    yrs ago   TOBACCO ABUSE 07/17/2009   Past Surgical History:  Procedure Laterality Date   ANTERIOR CERVICAL DECOMP/DISCECTOMY FUSION N/A 11/19/2021   Procedure: CERVICAL FOUR-FIVE, CERVICAL FIVE-SIX, CERVICAL SIX-SEVEN ANTERIOR CERVICAL DECOMPRESSION/DISCECTOMY FUSION;  Surgeon: Joshua Alm RAMAN, MD;  Location: [facility] OR;  Service: Neurosurgery;  Laterality: N/A;   APPENDECTOMY     BRONCHIAL BIOPSY  01/23/2024   Procedure: BRONCHOSCOPY, WITH BIOPSY;  Surgeon: Shelah Lamar RAMAN, MD;  Location: [facility] ENDOSCOPY;  Service: Pulmonary;;   BRONCHIAL NEEDLE ASPIRATION BIOPSY  01/23/2024  Procedure: BRONCHOSCOPY, WITH NEEDLE ASPIRATION BIOPSY;  Surgeon: Shelah Lamar RAMAN, MD;  Location: [facility] ENDOSCOPY;  Service: Pulmonary;;   OOPHORECTOMY     POSTERIOR CERVICAL FUSION/FORAMINOTOMY N/A 03/25/2022   Procedure: Cervical Three-Cervical Seven Posterior Cervical Fusion with Lateral Mass Fixation;  Surgeon: Joshua Alm RAMAN, MD;  Location: [facility]  OR;  Service: Neurosurgery;  Laterality: N/A;   Social History:  reports that she has been smoking cigarettes. She started smoking about 51 years ago. She has a 75 pack-year smoking history. She has never used smokeless tobacco. She reports current alcohol use. She reports that she does not use drugs.  Allergies[1]  Family History  Problem Relation Age of Onset   Diabetes Mother    Cancer Father        lung smoke    Heart disease Father    Clotting disorder Brother    Breast cancer Paternal Aunt    Stomach cancer Paternal Uncle    Esophageal cancer Neg Hx    Colon cancer Neg Hx     Prior to Admission medications  Medication Sig Start Date End Date Taking? Authorizing Provider  acetaminophen  (TYLENOL ) 500 MG tablet Take 1,000 mg by mouth every 8 (eight) hours.    [provider]  albuterol  (VENTOLIN  HFA) 108 (90 Base) MCG/ACT inhaler INHALE 2 PUFFS INTO THE LUNGS EVERY 4 HOURS AS NEEDED FOR WHEEZING OR SHORTNESS OF BREATH. 09/27/24   Burchette, Wolm ORN, MD  alendronate  (FOSAMAX ) 70 MG tablet Take 1 tablet (70 mg total) by mouth every 7 (seven) days. Take with a full glass of water  on an empty stomach. 09/30/24   Burchette, Wolm ORN, MD  ALPRAZolam  (XANAX ) 1 MG tablet Take 1 mg by mouth 3 (three) times daily. 12/18/23   [provider]  cyclobenzaprine  (FLEXERIL ) 10 MG tablet Take 10 mg by mouth 2 (two) times daily as needed for muscle spasms.    [provider]  Ensifentrine  (OHTUVAYRE ) 3 MG/2.5ML SUSP Inhale 1 ampule into the lungs 2 (two) times daily. 02/13/24   Ruthell Lauraine FALCON, NP  Fluticasone -Umeclidin-Vilant (TRELEGY ELLIPTA ) 100-62.5-25 MCG/ACT AEPB Inhale 1 puff into the lungs daily at 2 PM. 01/17/24   Ruthell Lauraine FALCON, NP  Fluticasone -Umeclidin-Vilant (TRELEGY ELLIPTA ) 100-62.5-25 MCG/ACT AEPB Inhale 1 puff into the lungs daily. 08/13/24   [name], [name], MD  Fluticasone -Umeclidin-Vilant (TRELEGY ELLIPTA ) 200-62.5-25 MCG/ACT AEPB Inhale 1 puff into the lungs  daily at 2 PM. 02/13/24   Ruthell Lauraine FALCON, NP  Fluticasone -Umeclidin-Vilant (TRELEGY ELLIPTA ) 200-62.5-25 MCG/ACT AEPB Inhale 1 puff into the lungs daily. 05/10/24   Ruthell Lauraine FALCON, NP  guaiFENesin -codeine  (GUAIATUSSIN AC) 100-10 MG/5ML syrup Take 5 mLs by mouth every 4 (four) hours as needed for cough. Take 5 mLs by mouth every 4 (four) hours as needed for cough or to loosen phlegm. 02/13/24   Ruthell Lauraine FALCON, NP  ipratropium-albuterol  (DUONEB) 0.5-2.5 (3) MG/3ML SOLN INHALE 3 ML BY NEBULIZER EVERY 6 HOURS AS NEEDED 05/02/24   [name], [name], MD  metroNIDAZOLE  (FLAGYL ) 500 MG tablet Take 1 tablet (500 mg total) by mouth 2 (two) times daily. 08/19/24   Yolande Lamar BROCKS, MD  nicotine  (NICODERM CQ  - DOSED IN MG/24 HOURS) 21 mg/24hr patch Place 1 patch (21 mg total) onto the skin daily. 12/29/23   Rojelio Nest, DO  ondansetron  (ZOFRAN -ODT) 4 MG disintegrating tablet Take 1 tablet (4 mg total) by mouth every 8 (eight) hours as needed for nausea or vomiting. 08/19/24   Yolande Lamar BROCKS, MD  Oxycodone  HCl  10 MG TABS Take 10 mg by mouth in the morning and at bedtime.    [provider]  pantoprazole  (PROTONIX ) 40 MG tablet Take 1 tablet (40 mg total) by mouth at bedtime. 02/09/24   Juvenal Harlene PENNER, DO    Physical Exam: Vitals:   10/06/24 0700 10/06/24 0715 10/06/24 0730 10/06/24 0735  BP:  (!) 107/59 (!) 99/56   Pulse: 95 94 92   Resp:  (!) 22 (!) 22   Temp:    98.3 F (36.8 C)  TempSrc:    Axillary  SpO2: 93% 94% 92%    Physical Exam Vitals and nursing note reviewed.  Constitutional:      General: She is awake. She is not in acute distress.    Appearance: Normal appearance. She is ill-appearing.     Interventions: Nasal cannula in place.  HENT:     Head: Normocephalic.     Nose: No rhinorrhea.     Mouth/Throat:     Mouth: Mucous membranes are moist.  Eyes:     General: No scleral icterus.    Pupils: Pupils are equal, round, and reactive to light.  Neck:     Vascular: No JVD.   Cardiovascular:     Rate and Rhythm: Normal rate and regular rhythm.     Heart sounds: S1 normal and S2 normal.  Pulmonary:     Effort: Pulmonary effort is normal. Prolonged expiration present. No accessory muscle usage.     Breath sounds: Wheezing and rhonchi present. No rales.  Abdominal:     General: Bowel sounds are normal. There is no distension.     Palpations: Abdomen is soft.     Tenderness: There is no abdominal tenderness. There is no right CVA tenderness or left CVA tenderness.  Musculoskeletal:     Cervical back: Neck supple.     Right lower leg: No edema.     Left lower leg: No edema.  Skin:    General: Skin is warm and dry.  Neurological:     General: No focal deficit present.     Mental Status: She is alert and oriented to person, place, and time.  Psychiatric:        Mood and Affect: Mood normal.        Behavior: Behavior normal. Behavior is cooperative.     Data Reviewed:  Results are pending, will review when available.  EKG: Vent. rate 138 BPM PR interval 87 ms QRS duration 92 ms QT/QTcB 402/610 ms P-R-T axes 77 75 67 Sinus tachycardia Consider right atrial enlargement Anteroseptal infarct, old  Minimal ST elevation, lateral leads  Prolonged QT interval   Assessment and Plan: Principal Problem:   Acute on chronic respiratory failure with hypoxia and hypercapnia (HCC) Secondary to:   COPD exacerbation (HCC) Due to:   Acute bronchitis  Admit to PCU/inpatient. Continue supplemental oxygen . Scheduled and as needed bronchodilators. Continue ceftriaxone  1 g IVPB daily. Continue azithromycin  500 mg IVPB daily. Check sputum Gram stain, culture and sensitivity. Follow-up blood culture and sensitivity. Follow-up CBC and chemistry in the morning.  Active Problems:   Malignant neoplasm of upper lobe of right lung (HCC) Underwent curative SBRT to the RUL nodule. -This was completed on March 18, 2024     Tobacco dependence Tobacco cessation  advised. Nicotine  replacement therapy ordered as needed.    Dyslipidemia Currently not on therapy. Follow-up with primary care provider.    Anxiety   Panic disorder Continue alprazolam  pending med rec.  Laryngopharyngeal reflux (LPR)   GERD Continue home meds. Antiacid, H2 blocker or PPI as needed.    S/P cervical spinal fusion Analgesics as needed.    Prolonged QT interval Avoid QT prolonging meds as possible. KCl supplementation. Magnesium  sulfate 2 g IVPB already given. Keep electrolytes optimized. Check EKG in the morning.    Normocytic anemia Monitor hematocrit and hemoglobin. Transfuse as needed.     Advance Care Planning:   Code Status: Full Code   Consults:   Family Communication:   Severity of Illness: The appropriate patient status for this patient is INPATIENT. Inpatient status is judged to be reasonable and necessary in order to provide the required intensity of service to ensure the patient's safety. The patient's presenting symptoms, physical exam findings, and initial radiographic and laboratory data in the context of their chronic comorbidities is felt to place them at high risk for further clinical deterioration. Furthermore, it is not anticipated that the patient will be medically stable for discharge from the hospital within 2 midnights of admission.   * I certify that at the point of admission it is my clinical judgment that the patient will require inpatient hospital care spanning beyond 2 midnights from the point of admission due to high intensity of service, high risk for further deterioration and high frequency of surveillance required.*  Author: Alm Dorn Castor, MD 10/06/2024 7:49 AM  For on call review [email or web address].   This document was prepared using Dragon voice recognition software and may contain some unintended transcription errors.     [1]  Allergies Allergen Reactions   Aspirin Other (See Comments)    REACTION: had h.  pylori due to too many goody powders. told not to take any more asa   "

## 2024-10-06 NOTE — ED Provider Notes (Signed)
 " [place] EMERGENCY DEPARTMENT AT [facility] Provider Note   CSN: [id number] Arrival date & time: 10/06/24  [id number]     Patient presents with: Respiratory Distress   [name] is a 63 y.o. female.   The history is provided by the patient, the EMS personnel and medical records.  [name] is a 63 y.o. female who presents to the Emergency Department complaining of respiratory distress.  She presents to the emergency department by EMS from home for evaluation of respiratory distress.  Per report she was 70% sats on room air.  She reports cough, difficulty breathing.  No reported fevers, vomiting, abdominal pain.  She is unsure when symptoms started.  EMS report that she was treated with albuterol , Atrovent , magnesium  and Solu-Medrol .  She was trialed on CPAP but could not tolerate.     Prior to Admission medications  Medication Sig Start Date End Date Taking? Authorizing Provider  acetaminophen  (TYLENOL ) 500 MG tablet Take 1,000 mg by mouth every 8 (eight) hours.    [provider]  albuterol  (VENTOLIN  HFA) 108 (90 Base) MCG/ACT inhaler INHALE 2 PUFFS INTO THE LUNGS EVERY 4 HOURS AS NEEDED FOR WHEEZING OR SHORTNESS OF BREATH. 09/27/24   Burchette, Wolm ORN, MD  alendronate  (FOSAMAX ) 70 MG tablet Take 1 tablet (70 mg total) by mouth every 7 (seven) days. Take with a full glass of water  on an empty stomach. 09/30/24   Burchette, Wolm ORN, MD  ALPRAZolam  (XANAX ) 1 MG tablet Take 1 mg by mouth 3 (three) times daily. 12/18/23   [provider]  cyclobenzaprine  (FLEXERIL ) 10 MG tablet Take 10 mg by mouth 2 (two) times daily as needed for muscle spasms.    [provider]  Ensifentrine  (OHTUVAYRE ) 3 MG/2.5ML SUSP Inhale 1 ampule into the lungs 2 (two) times daily. 02/13/24   Ruthell Lauraine FALCON, NP  Fluticasone -Umeclidin-Vilant (TRELEGY ELLIPTA ) 100-62.5-25 MCG/ACT AEPB Inhale 1 puff into the lungs daily at 2 PM. 01/17/24   Ruthell Lauraine FALCON, NP   Fluticasone -Umeclidin-Vilant (TRELEGY ELLIPTA ) 100-62.5-25 MCG/ACT AEPB Inhale 1 puff into the lungs daily. 08/13/24   [name], [name], MD  Fluticasone -Umeclidin-Vilant (TRELEGY ELLIPTA ) 200-62.5-25 MCG/ACT AEPB Inhale 1 puff into the lungs daily at 2 PM. 02/13/24   Ruthell Lauraine FALCON, NP  Fluticasone -Umeclidin-Vilant (TRELEGY ELLIPTA ) 200-62.5-25 MCG/ACT AEPB Inhale 1 puff into the lungs daily. 05/10/24   Ruthell Lauraine FALCON, NP  guaiFENesin -codeine  (GUAIATUSSIN AC) 100-10 MG/5ML syrup Take 5 mLs by mouth every 4 (four) hours as needed for cough. Take 5 mLs by mouth every 4 (four) hours as needed for cough or to loosen phlegm. 02/13/24   Ruthell Lauraine FALCON, NP  ipratropium-albuterol  (DUONEB) 0.5-2.5 (3) MG/3ML SOLN INHALE 3 ML BY NEBULIZER EVERY 6 HOURS AS NEEDED 05/02/24   [name], [name], MD  metroNIDAZOLE  (FLAGYL ) 500 MG tablet Take 1 tablet (500 mg total) by mouth 2 (two) times daily. 08/19/24   Yolande Lamar BROCKS, MD  nicotine  (NICODERM CQ  - DOSED IN MG/24 HOURS) 21 mg/24hr patch Place 1 patch (21 mg total) onto the skin daily. 12/29/23   Rojelio Nest, DO  ondansetron  (ZOFRAN -ODT) 4 MG disintegrating tablet Take 1 tablet (4 mg total) by mouth every 8 (eight) hours as needed for nausea or vomiting. 08/19/24   Yolande Lamar BROCKS, MD  Oxycodone  HCl 10 MG TABS Take 10 mg by mouth in the morning and at bedtime.    [provider]  pantoprazole  (PROTONIX ) 40 MG tablet Take 1 tablet (40 mg  total) by mouth at bedtime. 02/09/24   [name], [name], DO    Allergies: Aspirin    Review of Systems  All other systems reviewed and are negative.   Updated Vital Signs BP (!) 107/59   Pulse 94   Temp (!) 102.5 F (39.2 C) (Axillary)   Resp (!) 22   SpO2 94%   Physical Exam Vitals and nursing note reviewed.  Constitutional:      Appearance: She is well-developed.  HENT:     Head: Normocephalic and atraumatic.  Cardiovascular:     Rate and Rhythm: Regular rhythm. Tachycardia present.     Heart sounds: No  murmur heard. Pulmonary:     Comments: Tachypnea with frequent coughing, wheezing, rhonchi bilaterally.  Decreased air movement bilaterally. Abdominal:     Palpations: Abdomen is soft.     Tenderness: There is no abdominal tenderness. There is no guarding or rebound.  Musculoskeletal:        General: No swelling or tenderness.  Skin:    General: Skin is warm and dry.  Neurological:     Mental Status: She is alert and oriented to person, place, and time.  Psychiatric:        Behavior: Behavior normal.     (all labs ordered are listed, but only abnormal results are displayed) Labs Reviewed  COMPREHENSIVE METABOLIC PANEL WITH GFR - Abnormal; Notable for the following components:      Result Value   Chloride 97 (*)    CO2 34 (*)    Glucose, Bld 188 (*)    Alkaline Phosphatase 141 (*)    All other components within normal limits  CBC WITH DIFFERENTIAL/PLATELET - Abnormal; Notable for the following components:   RBC 3.55 (*)    Hemoglobin 10.7 (*)    HCT 34.7 (*)    Neutro Abs 8.4 (*)    All other components within normal limits  BLOOD GAS, VENOUS - Abnormal; Notable for the following components:   pCO2, Ven 85 (*)    Bicarbonate 39.0 (*)    Acid-Base Excess 9.3 (*)    All other components within normal limits  BLOOD GAS, VENOUS - Abnormal; Notable for the following components:   pCO2, Ven 70 (*)    pO2, Ven 69 (*)    Bicarbonate 34.4 (*)    Acid-Base Excess 6.3 (*)    All other components within normal limits  I-STAT CHEM 8, ED - Abnormal; Notable for the following components:   Chloride 95 (*)    Glucose, Bld 177 (*)    Calcium , Ion 1.14 (*)    TCO2 35 (*)    Hemoglobin 11.6 (*)    HCT 34.0 (*)    All other components within normal limits  RESP PANEL BY RT-PCR (RSV, FLU A&B, COVID)  RVPGX2  CULTURE, BLOOD (ROUTINE X 2)  CULTURE, BLOOD (ROUTINE X 2)  RESPIRATORY PANEL BY PCR  I-STAT CG4 LACTIC ACID, ED  I-STAT CG4 LACTIC ACID, ED  TROPONIN T, HIGH SENSITIVITY   TROPONIN T, HIGH SENSITIVITY    EKG: EKG Interpretation Date/Time:  Sunday October 06 2024 03:40:50 EST Ventricular Rate:  138 PR Interval:  87 QRS Duration:  92 QT Interval:  402 QTC Calculation: 610 R Axis:   75  Text Interpretation: Sinus tachycardia Consider right atrial enlargement Anteroseptal infarct, old Minimal ST elevation, lateral leads Prolonged QT interval Confirmed by Griselda Norris [phone]) on 10/06/2024 3:55:19 AM  Radiology: [facility] Chest Port 1 View Result Date: 10/06/2024 CLINICAL DATA:  Shortness of breath. EXAM: PORTABLE CHEST 1 VIEW COMPARISON:  02/05/2024 FINDINGS: The heart size and mediastinal contours are within normal limits. Emphysema again noted. No No evidence of acute infiltrate or pleural effusion. Several old left rib fracture deformities are again seen. Cervical spine fusion hardware again noted. IMPRESSION: Emphysema. No active disease. Electronically Signed   By: Norleen DELENA Kil M.D.   On: 10/06/2024 05:24     Procedures  CRITICAL CARE Performed by: Almarie Burner   Total critical care time: 35 minutes  Critical care time was exclusive of separately billable procedures and treating other patients.  Critical care was necessary to treat or prevent imminent or life-threatening deterioration.  Critical care was time spent personally by me on the following activities: development of treatment plan with patient and/or surrogate as well as nursing, discussions with consultants, evaluation of patient's response to treatment, examination of patient, obtaining history from patient or surrogate, ordering and performing treatments and interventions, ordering and review of laboratory studies, ordering and review of radiographic studies, pulse oximetry and re-evaluation of patient's condition.  Medications Ordered in the ED  albuterol  (PROVENTIL ) (2.5 MG/3ML) 0.083% nebulizer solution (0 mg/hr Nebulization Stopped 10/06/24 0457)  acetaminophen  (TYLENOL )  suppository 650 mg (650 mg Rectal Given 10/06/24 0401)  sodium chloride  0.9 % bolus 500 mL (500 mLs Intravenous New Bag/Given 10/06/24 0352)  LORazepam  (ATIVAN ) injection 0.5 mg (0.5 mg Intravenous Given 10/06/24 0401)  cefTRIAXone  (ROCEPHIN ) 1 g in sodium chloride  0.9 % 100 mL IVPB (0 g Intravenous Stopped 10/06/24 0525)  doxycycline  (VIBRAMYCIN ) 100 mg in sodium chloride  0.9 % 250 mL IVPB (0 mg Intravenous Stopped 10/06/24 0653)  albuterol  (PROVENTIL ) (2.5 MG/3ML) 0.083% nebulizer solution 5 mg (5 mg Nebulization Given 10/06/24 0617)  sodium chloride  0.9 % bolus 500 mL (0 mLs Intravenous Stopped 10/06/24 0700)                                    Medical Decision Making Amount and/or Complexity of Data Reviewed Labs: ordered. Radiology: ordered.  Risk OTC drugs. Prescription drug management. Decision regarding hospitalization.   Patient with history of COPD here for evaluation of difficulty breathing, hypoxia to the 70s.  Patient with respiratory distress at time of ED arrival.  She was tachycardic, tachypneic with accessory muscle use.  She was also found to be febrile.  She was started on antibiotics for possible pneumonia pending additional workup.  She was also treated with IV fluids, nebulizers through BiPAP circuit for respiratory support.  On repeat assessment patient with significant improvement in work of breathing.  She does have ongoing diminished air movement bilaterally but wheezing has resolved.  Repeat gases improving.  She is negative for COVID, flu.  She does report sick contacts from her grandchildren, suspect this may be a viral process with superimposed COPD exacerbation.  Medicine consulted for admission for ongoing management.  Patient is in agreement with admission.     Final diagnoses:  COPD exacerbation [facility])    ED Discharge Orders     None          Burner Almarie, MD 10/06/24 [phone]  "

## 2024-10-06 NOTE — Hospital Course (Signed)
 63 year old woman PMH including lung cancer treated with SBRT, presented with dyspnea, worsening shortness of breath, productive cough, reported hypoxia of 70% at home on room air, required BiPAP initially for about 4 hours and then was transition to 6 L nasal cannula.  Admitted for acute on chronic hypoxic and hypercapnic respiratory failure, bronchitis, COPD exacerbation.  Consultants None   Procedures/Events 12/28 admit for hypoxia, COPD exacerbation

## 2024-10-06 NOTE — ED Notes (Signed)
 Per MD stats in 90s good. Trial off of bipap

## 2024-10-06 NOTE — ED Triage Notes (Signed)
 Pt BIBA from home for resp distress, pt 70% on RA, family c/o for AMS/hypoxia. Denies cp.   PTA  18g left AC 10mg  albuterol  1mg  atrovent  2g mag sulfate 125mg  solumedrol

## 2024-10-07 DIAGNOSIS — D649 Anemia, unspecified: Secondary | ICD-10-CM | POA: Diagnosis not present

## 2024-10-07 DIAGNOSIS — E785 Hyperlipidemia, unspecified: Secondary | ICD-10-CM | POA: Diagnosis not present

## 2024-10-07 DIAGNOSIS — Z981 Arthrodesis status: Secondary | ICD-10-CM | POA: Diagnosis not present

## 2024-10-07 DIAGNOSIS — F410 Panic disorder [episodic paroxysmal anxiety] without agoraphobia: Secondary | ICD-10-CM

## 2024-10-07 DIAGNOSIS — C3411 Malignant neoplasm of upper lobe, right bronchus or lung: Secondary | ICD-10-CM | POA: Diagnosis not present

## 2024-10-07 DIAGNOSIS — J9621 Acute and chronic respiratory failure with hypoxia: Secondary | ICD-10-CM | POA: Diagnosis not present

## 2024-10-07 DIAGNOSIS — F17200 Nicotine dependence, unspecified, uncomplicated: Secondary | ICD-10-CM | POA: Diagnosis not present

## 2024-10-07 DIAGNOSIS — J441 Chronic obstructive pulmonary disease with (acute) exacerbation: Secondary | ICD-10-CM | POA: Diagnosis not present

## 2024-10-07 DIAGNOSIS — J209 Acute bronchitis, unspecified: Secondary | ICD-10-CM

## 2024-10-07 DIAGNOSIS — F419 Anxiety disorder, unspecified: Secondary | ICD-10-CM | POA: Diagnosis not present

## 2024-10-07 DIAGNOSIS — R9431 Abnormal electrocardiogram [ECG] [EKG]: Secondary | ICD-10-CM | POA: Diagnosis not present

## 2024-10-07 DIAGNOSIS — K219 Gastro-esophageal reflux disease without esophagitis: Secondary | ICD-10-CM | POA: Diagnosis not present

## 2024-10-07 LAB — COMPREHENSIVE METABOLIC PANEL WITH GFR
ALT: 13 U/L (ref 0–44)
AST: 13 U/L — ABNORMAL LOW (ref 15–41)
Albumin: 3.4 g/dL — ABNORMAL LOW (ref 3.5–5.0)
Alkaline Phosphatase: 83 U/L (ref 38–126)
Anion gap: 5 (ref 5–15)
BUN: 16 mg/dL (ref 8–23)
CO2: 32 mmol/L (ref 22–32)
Calcium: 9.2 mg/dL (ref 8.9–10.3)
Chloride: 103 mmol/L (ref 98–111)
Creatinine, Ser: 0.59 mg/dL (ref 0.44–1.00)
GFR, Estimated: >60 mL/min
Glucose, Bld: 109 mg/dL — ABNORMAL HIGH (ref 70–99)
Potassium: 5.1 mmol/L (ref 3.5–5.1)
Sodium: 140 mmol/L (ref 135–145)
Total Bilirubin: <0.2 mg/dL (ref 0.0–1.2)
Total Protein: 6.2 g/dL — ABNORMAL LOW (ref 6.5–8.1)

## 2024-10-07 LAB — CBC
HCT: 31.1 % — ABNORMAL LOW (ref 36.0–46.0)
Hemoglobin: 9.8 g/dL — ABNORMAL LOW (ref 12.0–15.0)
MCH: 30.3 pg (ref 26.0–34.0)
MCHC: 31.5 g/dL (ref 30.0–36.0)
MCV: 96.3 fL (ref 80.0–100.0)
Platelets: 147 K/uL — ABNORMAL LOW (ref 150–400)
RBC: 3.23 MIL/uL — ABNORMAL LOW (ref 3.87–5.11)
RDW: 13.2 % (ref 11.5–15.5)
WBC: 9.9 K/uL (ref 4.0–10.5)
nRBC: 0 % (ref 0.0–0.2)

## 2024-10-07 LAB — PROCALCITONIN: Procalcitonin: <0.1 ng/mL

## 2024-10-07 MED ORDER — HEPARIN SODIUM (PORCINE) 5000 UNIT/ML IJ SOLN
5000.0000 [IU] | Freq: Two times a day (BID) | INTRAMUSCULAR | Status: DC
  Administered 2024-10-07 – 2024-10-11 (×8): 5000 [IU] via SUBCUTANEOUS
  Filled 2024-10-07 (×9): qty 1

## 2024-10-07 MED ORDER — BENZONATATE 100 MG PO CAPS
200.0000 mg | ORAL_CAPSULE | Freq: Three times a day (TID) | ORAL | Status: DC
  Administered 2024-10-07: 200 mg via ORAL
  Filled 2024-10-07: qty 2

## 2024-10-07 MED ORDER — IPRATROPIUM-ALBUTEROL 0.5-2.5 (3) MG/3ML IN SOLN
3.0000 mL | Freq: Four times a day (QID) | RESPIRATORY_TRACT | Status: DC | PRN
  Administered 2024-10-07 – 2024-10-08 (×3): 3 mL via RESPIRATORY_TRACT
  Filled 2024-10-07 (×3): qty 3

## 2024-10-07 MED ORDER — DM-GUAIFENESIN ER 30-600 MG PO TB12
1.0000 | ORAL_TABLET | Freq: Two times a day (BID) | ORAL | Status: DC
  Administered 2024-10-07 – 2024-10-11 (×8): 1 via ORAL
  Filled 2024-10-07 (×9): qty 1

## 2024-10-07 MED ORDER — BENZONATATE 100 MG PO CAPS
200.0000 mg | ORAL_CAPSULE | Freq: Three times a day (TID) | ORAL | Status: DC | PRN
  Administered 2024-10-07: 200 mg via ORAL
  Filled 2024-10-07 (×3): qty 2

## 2024-10-07 MED ORDER — ACETAMINOPHEN-CODEINE 120-12 MG/5ML PO SOLN
5.0000 mL | Freq: Three times a day (TID) | ORAL | Status: DC | PRN
  Administered 2024-10-07 – 2024-10-10 (×7): 5 mL via ORAL
  Filled 2024-10-07 (×8): qty 5

## 2024-10-07 MED ORDER — PREDNISONE 50 MG PO TABS
50.0000 mg | ORAL_TABLET | Freq: Every day | ORAL | Status: AC
  Administered 2024-10-08 – 2024-10-11 (×4): 50 mg via ORAL
  Filled 2024-10-07 (×4): qty 1

## 2024-10-07 MED ORDER — BUDESON-GLYCOPYRROL-FORMOTEROL 160-9-4.8 MCG/ACT IN AERO
2.0000 | INHALATION_SPRAY | Freq: Two times a day (BID) | RESPIRATORY_TRACT | Status: DC
  Administered 2024-10-07 – 2024-10-11 (×6): 2 via RESPIRATORY_TRACT
  Filled 2024-10-07 (×2): qty 5.9

## 2024-10-07 MED ORDER — NICOTINE 7 MG/24HR TD PT24
7.0000 mg | MEDICATED_PATCH | Freq: Every day | TRANSDERMAL | Status: DC
  Administered 2024-10-07 – 2024-10-11 (×5): 7 mg via TRANSDERMAL
  Filled 2024-10-07 (×5): qty 1

## 2024-10-07 NOTE — Progress Notes (Addendum)
 " PROGRESS NOTE    [name]  FMW:[id number] DOB: 1961-01-05 DOA: 10/06/2024 PCP: Micheal Wolm ORN, MD  Chief Complaint  Patient presents with   Respiratory Distress    Hospital Course:  [name] is a 63 year old female with osteoarthritis, carpal tunnel syndrome, GERD, gastritis, H. pylori, dyslipidemia, depression, anxiety, panic disorder, migraine headaches, osteoporosis, stage Ib non-small cell lung cancer status post SBRT to the right upper lobe in June 2025, chronic respiratory failure on 2 L.  She presented to the ED with worsening dyspnea, cough, wheezing, fatigue.  She was found to be hypoxic to 70% on room air.  She received DuoNebs, magnesium , steroids. Vitals on arrival reveal temperature 102.5, tachycardic 138, tachypneic 22. CBC showed WBC 10.2, platelets 158.  Troponin and lactic acid x 2 WNL.  VBG pH 7.2 7/85/37/39.  Follow-up VBG mildly improved. CXR consistent with emphysema, no active disease. Patient was placed on BiPAP for 4 hours before being downgraded to nasal cannula.  She was admitted for further workup  Subjective: Patient endorses difficulty sleeping overnight due to persistent coughing.  This morning she reports minimal improvement.  Denies any pain  Objective: Vitals:   10/07/24 0400 10/07/24 0500 10/07/24 0600 10/07/24 0700  BP: (!) 111/55 (!) 110/54 136/70 104/71  Pulse: 87 82 89 86  Resp: (!) 25 (!) 24 (!) 24 17  Temp:      TempSrc:      SpO2: 95% 96% 94% 95%    Intake/Output Summary (Last 24 hours) at 10/07/2024 0744 Last data filed at 10/06/2024 1855 Gross per 24 hour  Intake 892.48 ml  Output --  Net 892.48 ml   There were no vitals filed for this visit.  Examination: General exam: Appears calm and comfortable, NAD  Respiratory system: No work of breathing, symmetric chest wall expansion.  Diffuse rhonchi bilaterally.  [place] in place Cardiovascular system: S1 & S2 heard, RRR.  Gastrointestinal system: Abdomen is  nondistended, soft and nontender.   Assessment & Plan:  Principal Problem:   Acute on chronic respiratory failure with hypoxia and hypercapnia (HCC) Active Problems:   COPD exacerbation (HCC)   Malignant neoplasm of upper lobe of right lung (HCC)   Tobacco dependence   Panic disorder   GERD   Dyslipidemia   Anxiety   Laryngopharyngeal reflux (LPR)   S/P cervical spinal fusion   Prolonged QT interval   Normocytic anemia   Acute bronchitis   Acute on chronic respiratory failure with hypoxia and hypercapnia COPD exacerbation Acute bronchitis - Chronically on 2.5L [place] - Flu/COVID/RSV negative.  Larger RVP negative. - Required BiPAP on arrival - Continue with supplemental oxygen , wean to baseline when able - Currently on prednisone , will continue 5-day course - Currently on ceftriaxone  and doxycycline .  MRSA neg. DC Doxy - Blood cultures pending - Scheduled Mucinex . PRN Tessalon .  As needed codeine  cough syrup at night --PT/OT  Sepsis - Septic criteria met on arrival with temperature 102, tachycardia, tachypnea. - Unclear infectious etiology. Perhaps early CAP vs viral etiology - RVP is negative. - Procalcitonin less than 0.1 - No leukocytosis - Cultures currently negative.  Follow-up to completion - Continue with ceftriaxone  for now  Malignant neoplasm of upper right lobe of lung - Underwent curative SBRT to right upper lobe.  Completed June 2025. - Continue surveillance outpatient pulmonology and oncology as directed  Tobacco dependence - Has been counseled on cessation - Nicotine  replacement ordered for 7 mg (can increase if needed.  She endorses  8 cigarettes daily)  Dyslipidemia - Not currently on statin outpatient - Follow-up with PCP  Anxiety Panic disorder - Continue alprazolam  - Resume home meds  Laryngopharyngeal reflux GERD - Continue home meds  Status post cervical fusion - Stable.  Aware.  Prolonged QT - Avoid further QT prolonging  medications - Keep electrolytes WNL - Repeat EKG ordered and pending  Normocytic anemia - Baseline hemoglobin around 12.  11.6 on arrival with gradual downtrend.  9.8 this morning. - Suspect this is dilutional.  No overt bleeding noted - Continue to monitor closely  Thrombocytopenia - Platelets on 147 today.  Normal at baseline.  Monitor.  Repeat CBC in a.m.   DVT prophylaxis: Heparin     Code Status: Full Code Disposition: Pending clinical resolution.  Consultants:    Procedures:    Antimicrobials:  Anti-infectives (From admission, onward)    Start     Dose/Rate Route Frequency Ordered Stop   10/07/24 0500  cefTRIAXone  (ROCEPHIN ) 1 g in sodium chloride  0.9 % 100 mL IVPB        1 g 200 mL/hr over 30 Minutes Intravenous Every 24 hours 10/06/24 0841     10/06/24 1700  doxycycline  (VIBRAMYCIN ) 100 mg in sodium chloride  0.9 % 250 mL IVPB        100 mg 125 mL/hr over 120 Minutes Intravenous Every 12 hours 10/06/24 0841     10/06/24 0430  cefTRIAXone  (ROCEPHIN ) 1 g in sodium chloride  0.9 % 100 mL IVPB        1 g 200 mL/hr over 30 Minutes Intravenous  Once 10/06/24 0423 10/06/24 0525   10/06/24 0430  azithromycin  (ZITHROMAX ) 500 mg in sodium chloride  0.9 % 250 mL IVPB  Status:  Discontinued        500 mg 250 mL/hr over 60 Minutes Intravenous  Once 10/06/24 0423 10/06/24 0424   10/06/24 0430  doxycycline  (VIBRAMYCIN ) 100 mg in sodium chloride  0.9 % 250 mL IVPB        100 mg 125 mL/hr over 120 Minutes Intravenous  Once 10/06/24 0424 10/06/24 0653       Data Reviewed: I have personally reviewed following labs and imaging studies CBC: Recent Labs  Lab 10/06/24 0350 10/06/24 0353 10/07/24 0246  WBC  --  10.2 9.9  NEUTROABS  --  8.4*  --   HGB 11.6* 10.7* 9.8*  HCT 34.0* 34.7* 31.1*  MCV  --  97.7 96.3  PLT  --  158 147*   Basic Metabolic Panel: Recent Labs  Lab 10/06/24 0350 10/06/24 0353 10/07/24 0246  NA 136 139 140  K 3.7 3.9 5.1  CL 95* 97* 103  CO2  --   34* 32  GLUCOSE 177* 188* 109*  BUN 15 14 16   CREATININE 0.70 0.59 0.59  CALCIUM   --  9.2 9.2  PHOS  --  2.4*  --    GFR: CrCl cannot be calculated (Unknown ideal weight.). Liver Function Tests: Recent Labs  Lab 10/06/24 0353 10/07/24 0246  AST 22 13*  ALT 20 13  ALKPHOS 141* 83  BILITOT 0.3 <0.2  PROT 7.1 6.2*  ALBUMIN 3.8 3.4*   CBG: No results for input(s): GLUCAP in the last 168 hours.  Recent Results (from the past 240 hours)  Resp panel by RT-PCR (RSV, Flu A&B, Covid) Anterior Nasal Swab     Status: None   Collection Time: 10/06/24  4:00 AM   Specimen: Anterior Nasal Swab  Result Value Ref Range Status   SARS Coronavirus  2 by RT PCR NEGATIVE NEGATIVE Final   Influenza A by PCR NEGATIVE NEGATIVE Final   Influenza B by PCR NEGATIVE NEGATIVE Final    Comment: (NOTE) The Xpert Xpress SARS-CoV-2/FLU/RSV plus assay is intended as an aid in the diagnosis of influenza from Nasopharyngeal swab specimens and should not be used as a sole basis for treatment. Nasal washings and aspirates are unacceptable for Xpert Xpress SARS-CoV-2/FLU/RSV testing.  Fact Sheet for Patients: bloggercourse.com  Fact Sheet for Healthcare Providers: seriousbroker.it  This test is not yet approved or cleared by the [place]  FDA and has been authorized for detection and/or diagnosis of SARS-CoV-2 by FDA under an Emergency Use Authorization (EUA). This EUA will remain in effect (meaning this test can be used) for the duration of the COVID-19 declaration under Section 564(b)(1) of the Act, 21 U.S.C. section 360bbb-3(b)(1), unless the authorization is terminated or revoked.     Resp Syncytial Virus by PCR NEGATIVE NEGATIVE Final    Comment: (NOTE) Fact Sheet for Patients: bloggercourse.com  Fact Sheet for Healthcare Providers: seriousbroker.it  This test is not yet approved or  cleared by the [place]  FDA and has been authorized for detection and/or diagnosis of SARS-CoV-2 by FDA under an Emergency Use Authorization (EUA). This EUA will remain in effect (meaning this test can be used) for the duration of the COVID-19 declaration under Section 564(b)(1) of the Act, 21 U.S.C. section 360bbb-3(b)(1), unless the authorization is terminated or revoked.  Performed at [facility], [address], [place], [place] [address]   Respiratory (~20 pathogens) panel by PCR     Status: None   Collection Time: 10/06/24  4:00 AM   Specimen: Nasopharyngeal Swab; Respiratory  Result Value Ref Range Status   Adenovirus NOT DETECTED NOT DETECTED Final   Coronavirus 229E NOT DETECTED NOT DETECTED Final    Comment: (NOTE) The Coronavirus on the Respiratory Panel, DOES NOT test for the novel  Coronavirus (2019 nCoV)    Coronavirus HKU1 NOT DETECTED NOT DETECTED Final   Coronavirus NL63 NOT DETECTED NOT DETECTED Final   Coronavirus OC43 NOT DETECTED NOT DETECTED Final   Metapneumovirus NOT DETECTED NOT DETECTED Final   Rhinovirus / Enterovirus NOT DETECTED NOT DETECTED Final   Influenza A NOT DETECTED NOT DETECTED Final   Influenza B NOT DETECTED NOT DETECTED Final   Parainfluenza Virus 1 NOT DETECTED NOT DETECTED Final   Parainfluenza Virus 2 NOT DETECTED NOT DETECTED Final   Parainfluenza Virus 3 NOT DETECTED NOT DETECTED Final   Parainfluenza Virus 4 NOT DETECTED NOT DETECTED Final   Respiratory Syncytial Virus NOT DETECTED NOT DETECTED Final   Bordetella pertussis NOT DETECTED NOT DETECTED Final   Bordetella Parapertussis NOT DETECTED NOT DETECTED Final   Chlamydophila pneumoniae NOT DETECTED NOT DETECTED Final   Mycoplasma pneumoniae NOT DETECTED NOT DETECTED Final    Comment: Performed at [facility], [address], [place], [place] [address]  MRSA Next Gen by PCR, Nasal     Status: None   Collection Time: 10/06/24  6:39 PM   Specimen: Nasal Mucosa;  Nasal Swab  Result Value Ref Range Status   MRSA by PCR Next Gen NOT DETECTED NOT DETECTED Final    Comment: (NOTE) The GeneXpert MRSA Assay (FDA approved for NASAL specimens only), is one component of a comprehensive MRSA colonization surveillance program. It is not intended to diagnose MRSA infection nor to guide or monitor treatment for MRSA infections. Test performance is not FDA approved in  patients less than 20 years old. Performed at [facility], [address] [place], [place] [address]      Radiology Studies: [facility] Chest Port 1 View Result Date: 10/06/2024 CLINICAL DATA:  Shortness of breath. EXAM: PORTABLE CHEST 1 VIEW COMPARISON:  02/05/2024 FINDINGS: The heart size and mediastinal contours are within normal limits. Emphysema again noted. No No evidence of acute infiltrate or pleural effusion. Several old left rib fracture deformities are again seen. Cervical spine fusion hardware again noted. IMPRESSION: Emphysema. No active disease. Electronically Signed   By: Norleen DELENA Kil M.D.   On: 10/06/2024 05:24    Scheduled Meds:  Chlorhexidine  Gluconate Cloth  6 each Topical Daily   pantoprazole   40 mg Oral QHS   predniSONE   30 mg Oral Q breakfast   Continuous Infusions:  cefTRIAXone  (ROCEPHIN )  IV Stopped (10/07/24 0656)   doxycycline  (VIBRAMYCIN ) IV 100 mg (10/07/24 0548)     LOS: 1 day  MDM: Patient is high risk for one or more organ failure.  They necessitate ongoing hospitalization for continued IV therapies and subsequent lab monitoring. Total time spent interpreting labs and vitals, reviewing the medical record, coordinating care amongst consultants and care team members, directly assessing and discussing care with the patient and/or family: 55 min  Kaitlyn Lips, DO [facility]  To contact the attending physician between 7A-7P please use Epic Chat. To contact the covering physician during after hours 7P-7A, please review Amion.  10/07/2024,  7:44 AM   *This document has been created with the assistance of dictation software. Please excuse typographical errors. *   "

## 2024-10-07 NOTE — Plan of Care (Incomplete)
  Problem: Education: Goal: Knowledge of General Education information will improve Description: Including pain rating scale, medication(s)/side effects and non-pharmacologic comfort measures Outcome: Progressing   Problem: Health Behavior/Discharge Planning: Goal: Ability to manage health-related needs will improve Outcome: Progressing   Problem: Clinical Measurements: Goal: Ability to maintain clinical measurements within normal limits will improve Outcome: Progressing Goal: Will remain free from infection Outcome: Progressing Goal: Diagnostic test results will improve Outcome: Progressing Goal: Respiratory complications will improve Outcome: Progressing Goal: Cardiovascular complication will be avoided Outcome: Progressing   Problem: Activity: Goal: Risk for activity intolerance will decrease Outcome: Progressing   Problem: Coping: Goal: Level of anxiety will decrease Outcome: Progressing   Problem: Nutrition: Goal: Adequate nutrition will be maintained Outcome: Adequate for Discharge

## 2024-10-08 DIAGNOSIS — C3411 Malignant neoplasm of upper lobe, right bronchus or lung: Secondary | ICD-10-CM | POA: Diagnosis not present

## 2024-10-08 DIAGNOSIS — J441 Chronic obstructive pulmonary disease with (acute) exacerbation: Secondary | ICD-10-CM | POA: Diagnosis not present

## 2024-10-08 DIAGNOSIS — K219 Gastro-esophageal reflux disease without esophagitis: Secondary | ICD-10-CM | POA: Diagnosis not present

## 2024-10-08 DIAGNOSIS — J9621 Acute and chronic respiratory failure with hypoxia: Secondary | ICD-10-CM | POA: Diagnosis not present

## 2024-10-08 LAB — CBC
HCT: 30.1 % — ABNORMAL LOW (ref 36.0–46.0)
Hemoglobin: 9.4 g/dL — ABNORMAL LOW (ref 12.0–15.0)
MCH: 30.1 pg (ref 26.0–34.0)
MCHC: 31.2 g/dL (ref 30.0–36.0)
MCV: 96.5 fL (ref 80.0–100.0)
Platelets: 163 K/uL (ref 150–400)
RBC: 3.12 MIL/uL — ABNORMAL LOW (ref 3.87–5.11)
RDW: 13.6 % (ref 11.5–15.5)
WBC: 7.9 K/uL (ref 4.0–10.5)
nRBC: 0 % (ref 0.0–0.2)

## 2024-10-08 LAB — COMPREHENSIVE METABOLIC PANEL WITH GFR
ALT: 15 U/L (ref 0–44)
AST: 14 U/L — ABNORMAL LOW (ref 15–41)
Albumin: 3.4 g/dL — ABNORMAL LOW (ref 3.5–5.0)
Alkaline Phosphatase: 80 U/L (ref 38–126)
Anion gap: 6 (ref 5–15)
BUN: 20 mg/dL (ref 8–23)
CO2: 34 mmol/L — ABNORMAL HIGH (ref 22–32)
Calcium: 9.1 mg/dL (ref 8.9–10.3)
Chloride: 101 mmol/L (ref 98–111)
Creatinine, Ser: 0.53 mg/dL (ref 0.44–1.00)
GFR, Estimated: >60 mL/min
Glucose, Bld: 106 mg/dL — ABNORMAL HIGH (ref 70–99)
Potassium: 4.3 mmol/L (ref 3.5–5.1)
Sodium: 141 mmol/L (ref 135–145)
Total Bilirubin: <0.2 mg/dL (ref 0.0–1.2)
Total Protein: 6.1 g/dL — ABNORMAL LOW (ref 6.5–8.1)

## 2024-10-08 LAB — PHOSPHORUS: Phosphorus: 2.6 mg/dL (ref 2.5–4.6)

## 2024-10-08 LAB — MAGNESIUM: Magnesium: 2.5 mg/dL — ABNORMAL HIGH (ref 1.7–2.4)

## 2024-10-08 MED ORDER — NALOXONE HCL 0.4 MG/ML IJ SOLN: 0.4000 mg | INTRAMUSCULAR | Status: DC | PRN

## 2024-10-08 MED ORDER — IPRATROPIUM-ALBUTEROL 0.5-2.5 (3) MG/3ML IN SOLN
3.0000 mL | Freq: Four times a day (QID) | RESPIRATORY_TRACT | Status: DC
  Administered 2024-10-08 (×2): 3 mL via RESPIRATORY_TRACT
  Filled 2024-10-08 (×2): qty 3

## 2024-10-08 MED ORDER — IPRATROPIUM-ALBUTEROL 0.5-2.5 (3) MG/3ML IN SOLN
3.0000 mL | Freq: Three times a day (TID) | RESPIRATORY_TRACT | Status: DC
  Administered 2024-10-09 – 2024-10-11 (×6): 3 mL via RESPIRATORY_TRACT
  Filled 2024-10-08 (×6): qty 3

## 2024-10-08 MED ORDER — OXYCODONE HCL 5 MG PO TABS
20.0000 mg | ORAL_TABLET | Freq: Four times a day (QID) | ORAL | Status: DC | PRN
  Administered 2024-10-08 – 2024-10-11 (×13): 20 mg via ORAL
  Filled 2024-10-08 (×13): qty 4

## 2024-10-08 MED ORDER — LACTULOSE 10 GM/15ML PO SOLN
20.0000 g | Freq: Two times a day (BID) | ORAL | Status: DC
  Administered 2024-10-08 – 2024-10-11 (×6): 20 g via ORAL
  Filled 2024-10-08 (×7): qty 30

## 2024-10-08 NOTE — Progress Notes (Signed)
 " PROGRESS NOTE    [name]  FMW:[id number] DOB: September 27, 1961 DOA: 10/06/2024 PCP: Micheal Wolm ORN, MD  Chief Complaint  Patient presents with   Respiratory Distress    Hospital Course:  [name] is a 63 year old female with osteoarthritis, carpal tunnel syndrome, GERD, gastritis, H. pylori, dyslipidemia, depression, anxiety, panic disorder, migraine headaches, osteoporosis, stage Ib non-small cell lung cancer status post SBRT to the right upper lobe in June 2025, chronic respiratory failure on 2 L.  She presented to the ED with worsening dyspnea, cough, wheezing, fatigue.  She was found to be hypoxic to 70% on room air.  She received DuoNebs, magnesium , steroids. Vitals on arrival reveal temperature 102.5, tachycardic 138, tachypneic 22. CBC showed WBC 10.2, platelets 158.  Troponin and lactic acid x 2 WNL.  VBG pH 7.2 7/85/37/39.  Follow-up VBG mildly improved. CXR consistent with emphysema, no active disease. Patient was placed on BiPAP for 4 hours before being downgraded to nasal cannula.  She was admitted for further workup  Subjective: Patient reports she is feeling better than yesterday.  Still has a nagging cough. Reports she is having difficulty with urination.  Bedside RN has bladder scanned and confirmed no residual.  Objective: Vitals:   10/07/24 2217 10/08/24 0309 10/08/24 1320 10/08/24 1404  BP: (!) 113/53 111/69 111/66   Pulse: 85 77 86   Resp: 20 19 18    Temp: 98.2 F (36.8 C) 97.7 F (36.5 C) 98.2 F (36.8 C)   TempSrc: Oral Oral Oral   SpO2: 95% 91% 94% 93%  Weight:      Height:        Intake/Output Summary (Last 24 hours) at 10/08/2024 1416 Last data filed at 10/08/2024 1200 Gross per 24 hour  Intake 600 ml  Output --  Net 600 ml   Filed Weights   10/07/24 1548  Weight: 55.9 kg    Examination: General exam: Appears calm and comfortable, NAD  Respiratory system: No work of breathing, symmetric chest wall expansion.  3L in place.  No  wheezing today. Cardiovascular system: S1 & S2 heard, RRR.  Gastrointestinal system: Abdomen is nondistended, soft and nontender.   Assessment & Plan:  Principal Problem:   Acute on chronic respiratory failure with hypoxia and hypercapnia (HCC) Active Problems:   COPD exacerbation (HCC)   Malignant neoplasm of upper lobe of right lung (HCC)   Tobacco dependence   Panic disorder   GERD   Dyslipidemia   Anxiety   Laryngopharyngeal reflux (LPR)   S/P cervical spinal fusion   Prolonged QT interval   Normocytic anemia   Acute bronchitis   Acute on chronic respiratory failure with hypoxia and hypercapnia COPD exacerbation Acute bronchitis - Chronically on 2.5L [place], appears to be near baseline now - RVP negative - Did require BiPAP on arrival - Continue with supplemental oxygen  - Continue with 5-day course of prednisone  - Scheduled nebulizers - Currently on ceftriaxone  and doxycycline .  MRSA neg. DC Doxy - Blood cultures negative, follow-up to completion - Scheduled Mucinex . PRN Tessalon .  As needed codeine  cough syrup at night --PT/OT, recommending home health  Sepsis - Septic criteria met on arrival with temperature 102, tachycardia, tachypnea. - Unclear infectious etiology. Perhaps early CAP vs viral etiology - RVP is negative. - Procalcitonin less than 0.1 - No leukocytosis - Blood cultures to completion - Ceftriaxone  as above  Malignant neoplasm of upper right lobe of lung - Underwent curative SBRT to right upper lobe.  Completed June 2025. -  Continue surveillance outpatient pulmonology and oncology as directed  Tobacco dependence - Has been counseled on cessation - Nicotine  replacement ordered for 7 mg (can increase if needed.  She endorses 8 cigarettes daily)  Dyslipidemia - Not currently on statin outpatient - Follow-up with PCP  Anxiety Panic disorder - Continue alprazolam  - Resume home meds  Laryngopharyngeal reflux GERD - Continue home meds  Status  post cervical fusion - Stable.  Aware.  Prolonged QT - Avoid further QT prolonging medications - Keep electrolytes WNL - Repeat EKG resolved.  QTc 430  Normocytic anemia - Baseline hemoglobin around 12.  11.6 on arrival with gradual downtrend.  - Suspect this is dilutional.  No overt bleeding noted - Continue to monitor closely  Thrombocytopenia - Platelets down trended to 147.  Have already returned to baseline  Chronic pain Cervical radiculopathy - Patient takes oxycodone  20 mg 5 times daily at home.  Also takes alprazolam  3 times daily.  Has chronic respiratory failure. - I discussed my concerns with this patient regarding her high opioid burden and combination of benzos and respiratory failure.  She endorses understanding.  Reports that she has been on this dose for a very long time but will consider slowly taper at discharge - For now continue with home dose oxy.     DVT prophylaxis: Heparin     Code Status: Full Code Disposition: Pending clinical resolution.  Likley home tomorrow with home health.  Home health ordered.  Consultants:    Procedures:    Antimicrobials:  Anti-infectives (From admission, onward)    Start     Dose/Rate Route Frequency Ordered Stop   10/07/24 0500  cefTRIAXone  (ROCEPHIN ) 1 g in sodium chloride  0.9 % 100 mL IVPB        1 g 200 mL/hr over 30 Minutes Intravenous Every 24 hours 10/06/24 0841     10/06/24 1700  doxycycline  (VIBRAMYCIN ) 100 mg in sodium chloride  0.9 % 250 mL IVPB  Status:  Discontinued        100 mg 125 mL/hr over 120 Minutes Intravenous Every 12 hours 10/06/24 0841 10/07/24 1122   10/06/24 0430  cefTRIAXone  (ROCEPHIN ) 1 g in sodium chloride  0.9 % 100 mL IVPB        1 g 200 mL/hr over 30 Minutes Intravenous  Once 10/06/24 0423 10/06/24 0525   10/06/24 0430  azithromycin  (ZITHROMAX ) 500 mg in sodium chloride  0.9 % 250 mL IVPB  Status:  Discontinued        500 mg 250 mL/hr over 60 Minutes Intravenous  Once 10/06/24 0423  10/06/24 0424   10/06/24 0430  doxycycline  (VIBRAMYCIN ) 100 mg in sodium chloride  0.9 % 250 mL IVPB        100 mg 125 mL/hr over 120 Minutes Intravenous  Once 10/06/24 0424 10/06/24 0653       Data Reviewed: I have personally reviewed following labs and imaging studies CBC: Recent Labs  Lab 10/06/24 0350 10/06/24 0353 10/07/24 0246 10/08/24 0516  WBC  --  10.2 9.9 7.9  NEUTROABS  --  8.4*  --   --   HGB 11.6* 10.7* 9.8* 9.4*  HCT 34.0* 34.7* 31.1* 30.1*  MCV  --  97.7 96.3 96.5  PLT  --  158 147* 163   Basic Metabolic Panel: Recent Labs  Lab 10/06/24 0350 10/06/24 0353 10/07/24 0246 10/08/24 0516  NA 136 139 140 141  K 3.7 3.9 5.1 4.3  CL 95* 97* 103 101  CO2  --  34* 32 34*  GLUCOSE 177* 188* 109* 106*  BUN 15 14 16 20   CREATININE 0.70 0.59 0.59 0.53  CALCIUM   --  9.2 9.2 9.1  MG  --   --   --  2.5*  PHOS  --  2.4*  --  2.6   GFR: Estimated Creatinine Clearance: 61.9 mL/min (by C-G formula based on SCr of 0.53 mg/dL). Liver Function Tests: Recent Labs  Lab 10/06/24 0353 10/07/24 0246 10/08/24 0516  AST 22 13* 14*  ALT 20 13 15   ALKPHOS 141* 83 80  BILITOT 0.3 <0.2 <0.2  PROT 7.1 6.2* 6.1*  ALBUMIN 3.8 3.4* 3.4*   CBG: No results for input(s): GLUCAP in the last 168 hours.  Recent Results (from the past 240 hours)  Culture, blood (routine x 2)     Status: None (Preliminary result)   Collection Time: 10/06/24  3:58 AM   Specimen: BLOOD  Result Value Ref Range Status   Specimen Description   Final    BLOOD BLOOD RIGHT FOREARM Performed at [facility], [address] [place], [place] [address]    Special Requests   Final    BOTTLES DRAWN AEROBIC AND ANAEROBIC Blood Culture results may not be optimal due to an inadequate volume of blood received in culture bottles Performed at [facility], [address], [place], [place] [address]    Culture   Final    NO GROWTH 2 DAYS Performed at [facility], [address], [place], [place] [address]    Report Status PENDING  Incomplete  Resp panel by RT-PCR (RSV, Flu A&B, Covid) Anterior Nasal Swab     Status: None   Collection Time: 10/06/24  4:00 AM   Specimen: Anterior Nasal Swab  Result Value Ref Range Status   SARS Coronavirus 2 by RT PCR NEGATIVE NEGATIVE Final   Influenza A by PCR NEGATIVE NEGATIVE Final   Influenza B by PCR NEGATIVE NEGATIVE Final    Comment: (NOTE) The Xpert Xpress SARS-CoV-2/FLU/RSV plus assay is intended as an aid in the diagnosis of influenza from Nasopharyngeal swab specimens and should not be used as a sole basis for treatment. Nasal washings and aspirates are unacceptable for Xpert Xpress SARS-CoV-2/FLU/RSV testing.  Fact Sheet for Patients: bloggercourse.com  Fact Sheet for Healthcare Providers: seriousbroker.it  This test is not yet approved or cleared by the [place]  FDA and has been authorized for detection and/or diagnosis of SARS-CoV-2 by FDA under an Emergency Use Authorization (EUA). This EUA will remain in effect (meaning this test can be used) for the duration of the COVID-19 declaration under Section 564(b)(1) of the Act, 21 U.S.C. section 360bbb-3(b)(1), unless the authorization is terminated or revoked.     Resp Syncytial Virus by PCR NEGATIVE NEGATIVE Final    Comment: (NOTE) Fact Sheet for Patients: bloggercourse.com  Fact Sheet for Healthcare Providers: seriousbroker.it  This test is not yet approved or cleared by the [place]  FDA and has been authorized for detection and/or diagnosis of SARS-CoV-2 by FDA under an Emergency Use Authorization (EUA). This EUA will remain in effect (meaning this test can be used) for the duration of the COVID-19 declaration under Section 564(b)(1) of the Act, 21 U.S.C. section 360bbb-3(b)(1), unless the authorization is terminated  or revoked.  Performed at [facility], [address], [place], [place] [address]   Respiratory (~20 pathogens) panel by PCR     Status: None   Collection Time: 10/06/24  4:00 AM  Specimen: Nasopharyngeal Swab; Respiratory  Result Value Ref Range Status   Adenovirus NOT DETECTED NOT DETECTED Final   Coronavirus 229E NOT DETECTED NOT DETECTED Final    Comment: (NOTE) The Coronavirus on the Respiratory Panel, DOES NOT test for the novel  Coronavirus (2019 nCoV)    Coronavirus HKU1 NOT DETECTED NOT DETECTED Final   Coronavirus NL63 NOT DETECTED NOT DETECTED Final   Coronavirus OC43 NOT DETECTED NOT DETECTED Final   Metapneumovirus NOT DETECTED NOT DETECTED Final   Rhinovirus / Enterovirus NOT DETECTED NOT DETECTED Final   Influenza A NOT DETECTED NOT DETECTED Final   Influenza B NOT DETECTED NOT DETECTED Final   Parainfluenza Virus 1 NOT DETECTED NOT DETECTED Final   Parainfluenza Virus 2 NOT DETECTED NOT DETECTED Final   Parainfluenza Virus 3 NOT DETECTED NOT DETECTED Final   Parainfluenza Virus 4 NOT DETECTED NOT DETECTED Final   Respiratory Syncytial Virus NOT DETECTED NOT DETECTED Final   Bordetella pertussis NOT DETECTED NOT DETECTED Final   Bordetella Parapertussis NOT DETECTED NOT DETECTED Final   Chlamydophila pneumoniae NOT DETECTED NOT DETECTED Final   Mycoplasma pneumoniae NOT DETECTED NOT DETECTED Final    Comment: Performed at [facility], [address], [place], [place] [address]  Culture, blood (routine x 2)     Status: None (Preliminary result)   Collection Time: 10/06/24  4:15 AM   Specimen: BLOOD  Result Value Ref Range Status   Specimen Description   Final    BLOOD BLOOD LEFT FOREARM Performed at [facility], [address] [place], [place] [address]    Special Requests   Final    BOTTLES DRAWN AEROBIC AND ANAEROBIC Blood Culture results may not be optimal due to an inadequate volume of blood received in culture  bottles Performed at [facility], [address] [place], [place] [address]    Culture   Final    NO GROWTH 2 DAYS Performed at [facility], [address], [place], [place] [address]    Report Status PENDING  Incomplete  MRSA Next Gen by PCR, Nasal     Status: None   Collection Time: 10/06/24  6:39 PM   Specimen: Nasal Mucosa; Nasal Swab  Result Value Ref Range Status   MRSA by PCR Next Gen NOT DETECTED NOT DETECTED Final    Comment: (NOTE) The GeneXpert MRSA Assay (FDA approved for NASAL specimens only), is one component of a comprehensive MRSA colonization surveillance program. It is not intended to diagnose MRSA infection nor to guide or monitor treatment for MRSA infections. Test performance is not FDA approved in patients less than 38 years old. Performed at [facility], [address] [place], [place] [address]      Radiology Studies: No results found.   Scheduled Meds:  budesonide -glycopyrrolate -formoterol   2 puff Inhalation BID   Chlorhexidine  Gluconate Cloth  6 each Topical Daily   dextromethorphan -guaiFENesin   1 tablet Oral BID   heparin  injection (subcutaneous)  5,000 Units Subcutaneous Q12H   ipratropium-albuterol   3 mL Nebulization Q6H   lactulose   20 g Oral BID   nicotine   7 mg Transdermal Daily   pantoprazole   40 mg Oral QHS   predniSONE   50 mg Oral Q breakfast   Continuous Infusions:  cefTRIAXone  (ROCEPHIN )  IV 1 g (10/08/24 0518)     LOS: 2 days  MDM: Patient is high risk for one or more organ failure.  They necessitate ongoing hospitalization for continued  IV therapies and subsequent lab monitoring. Total time spent interpreting labs and vitals, reviewing the medical record, coordinating care amongst consultants and care team members, directly assessing and discussing care with the patient and/or family: 55 min  Lamontae Ricardo, DO [facility]  To contact the attending physician between 7A-7P  please use Epic Chat. To contact the covering physician during after hours 7P-7A, please review Amion.  10/08/2024, 2:16 PM   *This document has been created with the assistance of dictation software. Please excuse typographical errors. *   "

## 2024-10-08 NOTE — Plan of Care (Incomplete)
" °  Problem: Education: Goal: Knowledge of General Education information will improve Description: Including pain rating scale, medication(s)/side effects and non-pharmacologic comfort measures Outcome: Progressing   Problem: Health Behavior/Discharge Planning: Goal: Ability to manage health-related needs will improve Outcome: Progressing   Problem: Clinical Measurements: Goal: Ability to maintain clinical measurements within normal limits will improve Outcome: Progressing Goal: Will remain free from infection Outcome: Progressing Goal: Diagnostic test results will improve Outcome: Progressing Goal: Respiratory complications will improve Outcome: Progressing   Problem: Activity: Goal: Risk for activity intolerance will decrease Outcome: Progressing   Problem: Coping: Goal: Level of anxiety will decrease Outcome: Progressing   Problem: Clinical Measurements: Goal: Cardiovascular complication will be avoided Outcome: Adequate for Discharge   Problem: Nutrition: Goal: Adequate nutrition will be maintained Outcome: Adequate for Discharge   Problem: Elimination: Goal: Will not experience complications related to bowel motility Outcome: Adequate for Discharge Goal: Will not experience complications related to urinary retention Outcome: Adequate for Discharge   "

## 2024-10-08 NOTE — TOC Transition Note (Signed)
 Transition of Care [facility]) - Discharge Note   Patient Details  Name: [name] MRN: [id number] Date of Birth: 1960-11-12  Transition of Care [facility]) CM/SW Contact:  Bascom Service, RN Phone Number: 10/08/2024, 2:47 PM   Clinical Narrative:  [facility] for HHPT-no preference;Already active w/[employer] rep Jermaine to deliver rollator to rm prior d/c. Has own transport home.     Final next level of care: Home w Home Health Services Barriers to Discharge: Continued Medical Work up   Patient Goals and CMS Choice Patient states their goals for this hospitalization and ongoing recovery are:: Home CMS Medicare.gov Compare Post Acute Care list provided to:: Patient Choice offered to / list presented to : Patient Keys ownership interest in [facility].provided to:: Patient    Discharge Placement                       Discharge Plan and Services Additional resources added to the After Visit Summary for     Discharge Planning Services: CM Consult Post Acute Care Choice: Durable Medical Equipment, Home Health          DME Arranged: Walker rolling with seat DME Agency: [employer] Date DME Agency Contacted: 10/08/24 Time DME Agency Contacted: 1446 Representative spoke with at DME Agency: [place] HH Arranged: PT HH Agency: [facility] Date [facility] Agency Contacted: 10/08/24 Time HH Agency Contacted: 1446 Representative spoke with at [facility] Agency: amy  Social Drivers of Health (SDOH) Interventions SDOH Screenings   Food Insecurity: No Food Insecurity (02/12/2024)  Housing: Low Risk (02/12/2024)  Transportation Needs: No Transportation Needs (02/12/2024)  Utilities: Not At Risk (02/12/2024)  Depression (PHQ2-9): Low Risk (02/12/2024)  Tobacco Use: High Risk (10/06/2024)     Readmission Risk Interventions    02/06/2024   12:06 PM  Readmission Risk Prevention Plan  Transportation Screening Complete  PCP or Specialist Appt within 5-7 Days Complete   Home Care Screening Complete  Medication Review (RN CM) Complete
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
## 2024-10-09 DIAGNOSIS — J441 Chronic obstructive pulmonary disease with (acute) exacerbation: Secondary | ICD-10-CM | POA: Diagnosis not present

## 2024-10-09 DIAGNOSIS — J9622 Acute and chronic respiratory failure with hypercapnia: Secondary | ICD-10-CM | POA: Diagnosis not present

## 2024-10-09 DIAGNOSIS — D649 Anemia, unspecified: Secondary | ICD-10-CM | POA: Diagnosis not present

## 2024-10-09 DIAGNOSIS — J9621 Acute and chronic respiratory failure with hypoxia: Secondary | ICD-10-CM | POA: Diagnosis not present

## 2024-10-09 LAB — MAGNESIUM: Magnesium: 2.4 mg/dL (ref 1.7–2.4)
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
## 2024-10-09 NOTE — Patient Instructions (Signed)
 SABRA

## 2024-10-09 NOTE — Progress Notes (Signed)
Report called and given to [facility]

## 2024-10-09 NOTE — Care Management Important Message (Signed)
 Important Message  Patient Details IM Letter given. Name: [name] MRN: [id number] Date of Birth: 11/07/1960   Important Message Given:  Yes - Medicare IM     Melba Ates 10/09/2024, 12:33 PM

## 2024-10-09 NOTE — Progress Notes (Signed)
" °  Progress Note   Patient: [name] FMW:[id number] DOB: 1960-12-27 DOA: 10/06/2024     3 DOS: the patient was seen and examined on 10/09/2024   Brief hospital course: 63 year old woman PMH including lung cancer treated with SBRT, presented with dyspnea, worsening shortness of breath, productive cough, reported hypoxia of 70% at home on room air, required BiPAP initially for about 4 hours and then was transition to 6 L nasal cannula.  Admitted for acute on chronic hypoxic and hypercapnic respiratory failure, bronchitis, COPD exacerbation.  Consultants None   Procedures/Events 12/28 admit for hypoxia, COPD exacerbation  Assessment and Plan: Acute on chronic hypoxic, hypercapnic respiratory failure COPD exacerbation Acute bronchitis Chronic oxygen  requirement 2.5 L.  Required BiPAP on arrival. Influenza, RSV, COVID, RVP negative, blood cultures no growth today.  Procalcitonin less than 0.1, no leukocytosis.  Chest x-ray emphysema. Slowly improving.  Continue bronchodilators, ceftriaxone , steroid Plan Home health  Sepsis ruled out.  No signs or symptoms of infection.  Lung cancer right upper lobe Status post curative SBRT June 2025 Follow-up as an outpatient  Hyperglycemia Hemoglobin A1c was 6.09 December 2023 Follow-up as an outpatient  Anxiety Panic disorder Continue Xanax   Normocytic anemia Baseline hemoglobin around 12.  Hemoglobin stable here 9-10.  Follow-up as an outpatient.  Thrombocytopenia Transient, resolved  Chronic neck pain Cervical radiculopathy Takes oxycodone , Xanax  at home  Laryngopharyngeal reflux GERD  Prolonged QT resolved  Disposition Home health      Subjective:  Feels better but breathing is not back to baseline yet  Physical Exam: Vitals:   10/08/24 1404 10/08/24 2010 10/08/24 2032 10/09/24 0440  BP:   104/73 117/69  Pulse:   88 72  Resp:   20 19  Temp:   98.1 F (36.7 C) 97.9 F (36.6 C)  TempSrc:   Oral Oral  SpO2: 93%  93% 97% 95%  Weight:      Height:       Physical Exam Vitals reviewed.  Constitutional:      General: She is not in acute distress.    Appearance: She is not ill-appearing or toxic-appearing.  Cardiovascular:     Rate and Rhythm: Normal rate and regular rhythm.     Heart sounds: No murmur heard. Pulmonary:     Effort: Pulmonary effort is normal. No respiratory distress.     Breath sounds: No wheezing, rhonchi or rales.  Neurological:     Mental Status: She is alert.  Psychiatric:        Mood and Affect: Mood normal.        Behavior: Behavior normal.     Data Reviewed: Magnesium  2.4  Family Communication: none present or requested  Disposition: Status is: Inpatient Remains inpatient appropriate because: COPD exacerbation     Time spent: 35 minutes  Author: Toribio Door, MD 10/09/2024 10:54 AM  For on call review [email or web address].    "

## 2024-10-10 DIAGNOSIS — J9622 Acute and chronic respiratory failure with hypercapnia: Secondary | ICD-10-CM | POA: Diagnosis not present

## 2024-10-10 DIAGNOSIS — J441 Chronic obstructive pulmonary disease with (acute) exacerbation: Secondary | ICD-10-CM | POA: Diagnosis not present

## 2024-10-10 DIAGNOSIS — J9621 Acute and chronic respiratory failure with hypoxia: Secondary | ICD-10-CM | POA: Diagnosis not present

## 2024-10-10 LAB — HEMOGLOBIN A1C
Hgb A1c MFr Bld: 5.7 % — ABNORMAL HIGH (ref 4.8–5.6)
Mean Plasma Glucose: 116.89 mg/dL

## 2024-10-10 LAB — MAGNESIUM: Magnesium: 2.2 mg/dL (ref 1.7–2.4)

## 2024-10-10 NOTE — Progress Notes (Signed)
" °   10/10/24 0447  BiPAP/CPAP/SIPAP  Reason BIPAP/CPAP not in use Other(comment) (NOT INDICATED. NOT IN ROOM)    "
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
## 2024-10-10 NOTE — Progress Notes (Signed)
" °  Progress Note   Patient: [name] FMW:[id number] DOB: 1961-02-17 DOA: 10/06/2024     4 DOS: the patient was seen and examined on 10/10/2024   Brief hospital course: 64 year old woman PMH including lung cancer treated with SBRT, presented with dyspnea, worsening shortness of breath, productive cough, reported hypoxia of 70% at home on room air, required BiPAP initially for about 4 hours and then was transition to 6 L nasal cannula.  Admitted for acute on chronic hypoxic and hypercapnic respiratory failure, bronchitis, COPD exacerbation.  Consultants None   Procedures/Events 12/28 admit for hypoxia, COPD exacerbation  Assessment and Plan: Acute on chronic hypoxic, hypercapnic respiratory failure COPD exacerbation Acute bronchitis Chronic oxygen  requirement 2.5 L.  Required BiPAP on arrival. Influenza, RSV, COVID, RVP negative, blood cultures no growth today.  Procalcitonin less than 0.1, no leukocytosis.  Chest x-ray emphysema. Plan Home health.  Completed antibiotics. Improved but not back to baseline, continue current treatments including steroids, bronchodilators.   Sepsis ruled out.  No signs or symptoms of infection.   Lung cancer right upper lobe Status post curative SBRT June 2025 Follow-up as an outpatient   Hyperglycemia Borderline prediabetes Hemoglobin A1c 5.7 Follow-up as an outpatient   Anxiety Panic disorder Continue Xanax    Normocytic anemia Baseline hemoglobin around 12.  Hemoglobin stable here 9-10.  Follow-up as an outpatient.   Thrombocytopenia Transient, resolved   Chronic neck pain Cervical radiculopathy Takes oxycodone , Xanax  at home   Laryngopharyngeal reflux GERD   Prolonged QT resolved   Disposition Home health  Hopefully home next day or 2    Subjective:  Feels better, but weak and not back to baseline  Physical Exam: Vitals:   10/09/24 1945 10/10/24 0421 10/10/24 0617 10/10/24 0621  BP: 124/72 (!) 87/68    Pulse: 78 80     Resp: 16 18    Temp: 97.9 F (36.6 C) 97.8 F (36.6 C)    TempSrc:      SpO2: 95% 98% 98% 97%  Weight:      Height:       Physical Exam Vitals reviewed.  Constitutional:      General: She is not in acute distress.    Appearance: She is not ill-appearing or toxic-appearing.  Cardiovascular:     Rate and Rhythm: Normal rate and regular rhythm.     Heart sounds: No murmur heard. Pulmonary:     Effort: No respiratory distress.     Breath sounds: No wheezing, rhonchi or rales.     Comments: Mild increased respiratory effort Neurological:     Mental Status: She is alert.  Psychiatric:        Mood and Affect: Mood normal.        Behavior: Behavior normal.     Data Reviewed: Magnesium  within normal limits Hemoglobin A1c 5.7 consistent with prediabetes  Family Communication: None present or requested  Disposition: Status is: Inpatient Remains inpatient appropriate because: COPD     Time spent: 20 minutes  Author: Toribio Door, MD 10/10/2024 12:42 PM  For on call review [email or web address]    "

## 2024-10-10 NOTE — Plan of Care (Signed)

## 2024-10-11 DIAGNOSIS — J9621 Acute and chronic respiratory failure with hypoxia: Secondary | ICD-10-CM | POA: Diagnosis not present

## 2024-10-11 DIAGNOSIS — J9622 Acute and chronic respiratory failure with hypercapnia: Secondary | ICD-10-CM | POA: Diagnosis not present

## 2024-10-11 DIAGNOSIS — J441 Chronic obstructive pulmonary disease with (acute) exacerbation: Secondary | ICD-10-CM | POA: Diagnosis not present

## 2024-10-11 LAB — CULTURE, BLOOD (ROUTINE X 2)
Culture: NO GROWTH
Culture: NO GROWTH

## 2024-10-11 MED ORDER — POLYETHYLENE GLYCOL 3350 17 G PO PACK: 17.0000 g | PACK | Freq: Every day | ORAL | Status: AC | PRN

## 2024-10-11 MED ORDER — ACETAMINOPHEN-CODEINE 120-12 MG/5ML PO SOLN
5.0000 mL | Freq: Once | ORAL | Status: AC
  Administered 2024-10-11: 5 mL via ORAL
  Filled 2024-10-11: qty 5

## 2024-10-11 MED ORDER — ACETAMINOPHEN-CODEINE 120-12 MG/5ML PO SOLN: 5.0000 mL | Freq: Every day | ORAL | 0 refills | Status: AC | PRN

## 2024-10-11 MED ORDER — PREDNISONE 10 MG PO TABS: ORAL_TABLET | ORAL | 0 refills | Status: AC

## 2024-10-11 NOTE — Discharge Summary (Signed)
 " Physician Discharge Summary   Patient: [name] MRN: [id number] DOB: [birth year]  Admit date:     10/06/2024  Discharge date: 10/11/2024  Discharge Physician: Toribio Door   PCP: Micheal Wolm ORN, MD   Recommendations at discharge:  Ongoing care for COPD.  Follow-up with pulmonology as an outpatient.  Discharge Diagnoses: Principal Problem:   Acute on chronic respiratory failure with hypoxia and hypercapnia (HCC) Active Problems:   COPD exacerbation (HCC)   Malignant neoplasm of upper lobe of right lung (HCC)   Tobacco dependence   Panic disorder   GERD   Dyslipidemia   Anxiety   Laryngopharyngeal reflux (LPR)   S/P cervical spinal fusion   Prolonged QT interval   Normocytic anemia   Acute bronchitis  Resolved Problems:   * No resolved hospital problems. *  Hospital Course: 64 year old woman PMH including lung cancer treated with SBRT, presented with dyspnea, worsening shortness of breath, productive cough, reported hypoxia of 70% at home on room air, required BiPAP initially for about 4 hours and then was transition to 6 L nasal cannula.  Admitted for acute on chronic hypoxic and hypercapnic respiratory failure, bronchitis, COPD exacerbation.  Treated with standard therapy with slow improvement.  Consultants None   Procedures/Events 12/28 admit for hypoxia, COPD exacerbation  Acute on chronic hypoxic, hypercapnic respiratory failure COPD exacerbation Acute bronchitis Chronic oxygen  requirement 2.5 L.  Required BiPAP on arrival. Influenza, RSV, COVID, RVP negative, blood cultures no growth today.  Procalcitonin less than 0.1, no leukocytosis.  Chest x-ray emphysema. Plan Home health.  Completed antibiotics. Overall improved.  Can discharge home, complete steroids.   Sepsis ruled out.  No signs or symptoms of infection.   Lung cancer right upper lobe Status post curative SBRT June 2025 Follow-up as an outpatient   Hyperglycemia Borderline  prediabetes Hemoglobin A1c 5.7 Follow-up as an outpatient   Anxiety Panic disorder Continue Xanax    Normocytic anemia Baseline hemoglobin around 12.  Hemoglobin stable here 9-10.  Follow-up as an outpatient.   Thrombocytopenia Transient, resolved   Chronic neck pain Cervical radiculopathy Takes oxycodone , Xanax  at home   Laryngopharyngeal reflux GERD   Prolonged QT resolved   Disposition Home health      Pain control - [place]  Controlled Substance Reporting System database was reviewed.   Disposition: Home health Diet recommendation:  Regular diet DISCHARGE MEDICATION: Allergies as of 10/11/2024       Reactions   Aspirin Other (See Comments)   REACTION: had h. pylori due to too many goody powders. told not to take any more asa        Medication List     STOP taking these medications    acetaminophen  500 MG tablet Commonly known as: TYLENOL    cyclobenzaprine  10 MG tablet Commonly known as: FLEXERIL    guaiFENesin -codeine  100-10 MG/5ML syrup Commonly known as: Guaiatussin AC   metroNIDAZOLE  500 MG tablet Commonly known as: FLAGYL    Ohtuvayre  3 MG/2.5ML Susp Generic drug: Ensifentrine    pantoprazole  40 MG tablet Commonly known as: PROTONIX        TAKE these medications    acetaminophen -codeine  120-12 MG/5ML solution Take 5 mLs by mouth daily as needed for up to 5 days (refractory cough at night).   albuterol  108 (90 Base) MCG/ACT inhaler Commonly known as: VENTOLIN  HFA INHALE 2 PUFFS INTO THE LUNGS EVERY 4 HOURS AS NEEDED FOR WHEEZING OR SHORTNESS OF BREATH.   alendronate  70 MG tablet Commonly known as: FOSAMAX  Take 1 tablet (70 mg total)  by mouth every 7 (seven) days. Take with a full glass of water  on an empty stomach.   ALPRAZolam  1 MG tablet Commonly known as: XANAX  Take 1 mg by mouth 3 (three) times daily.   ipratropium-albuterol  0.5-2.5 (3) MG/3ML Soln Commonly known as: DUONEB INHALE 3 ML BY NEBULIZER EVERY 6 HOURS AS  NEEDED What changed: See the new instructions.   nicotine  21 mg/24hr patch Commonly known as: NICODERM CQ  - dosed in mg/24 hours Place 1 patch (21 mg total) onto the skin daily.   omeprazole  20 MG capsule Commonly known as: PRILOSEC Take 20 mg by mouth daily.   ondansetron  4 MG disintegrating tablet Commonly known as: ZOFRAN -ODT Take 1 tablet (4 mg total) by mouth every 8 (eight) hours as needed for nausea or vomiting.   Oxycodone  HCl 20 MG Tabs Take 20 mg by mouth in the morning and at bedtime.   polyethylene glycol 17 g packet Commonly known as: MiraLax  Take 17 g by mouth daily as needed for mild constipation.   predniSONE  10 MG tablet Commonly known as: DELTASONE  Take 2 tablets (20 mg total) by mouth daily for 2 days, THEN 1 tablet (10 mg total) daily for 2 days. Start taking on: October 11, 2024   Trelegy Ellipta  100-62.5-25 MCG/ACT Aepb Generic drug: Fluticasone -Umeclidin-Vilant Inhale 1 puff into the lungs daily.               Durable Medical Equipment  (From admission, onward)           Start     Ordered   10/08/24 1418  For home use only DME 4 wheeled rolling walker with seat  Once       Question:  Patient needs a walker to treat with the following condition  Answer:  Unsteady gait   10/08/24 1418            Contact information for follow-up providers     [facility] Follow up.   Specialty: Pulmonology Why: Office will call you with an appointment Contact information: [address] [place]  [address] [phone] Additional information: [address]  [address]  [place], [place] [address]             Contact information for after-discharge care     Durable Medical Equipment     [employer] (DME) Follow up.   Service: Durable Medical Equipment Why: rollator Contact information: [address] [employer] [place]   [address] [phone]             Home Medical Care     [facility]) .   Service: Home Health Services Why: HHPT Contact information: [address] [place]  [address] [phone]                    Discharge Exam: Fredricka Weights   10/07/24 1548  Weight: 55.9 kg   Physical Exam Vitals reviewed.  Constitutional:      General: She is not in acute distress.    Appearance: She is not ill-appearing or toxic-appearing.  Cardiovascular:     Rate and Rhythm: Normal rate and regular rhythm.     Heart sounds: No murmur heard. Pulmonary:     Effort: Pulmonary effort is normal. No respiratory distress.     Breath sounds: No wheezing, rhonchi or rales.  Neurological:     Mental Status: She is alert.  Psychiatric:        Mood and Affect: Mood normal.        Behavior: Behavior normal.      Condition at discharge: good  The results of significant diagnostics from this hospitalization (including imaging, microbiology, ancillary and laboratory) are listed below for reference.   Imaging Studies: DG Chest Port 1 View Result Date: 10/06/2024 CLINICAL DATA:  Shortness of breath. EXAM: PORTABLE CHEST 1 VIEW COMPARISON:  02/05/2024 FINDINGS: The heart size and mediastinal contours are within normal limits. Emphysema again noted. No No evidence of acute infiltrate or pleural effusion. Several old left rib fracture deformities are again seen. Cervical spine fusion hardware again noted. IMPRESSION: Emphysema. No active disease. Electronically Signed   By: Norleen DELENA Kil M.D.   On: 10/06/2024 05:24   DG Bone Density Result Date: 09/26/2024 Table formatting from the original result was not included. Date of study: 09/25/2024 Exam: DUAL X-RAY ABSORPTIOMETRY (DXA) FOR BONE MINERAL DENSITY (BMD) Instrument: [employer] Requesting Provider: PCP Indication: Follow-up for osteoporosis Comparison: none (please note that it  is not possible to compare data from different instruments) Clinical data: Pt is a 65 y.o. female without history of fragility (osteoporotic) fracture. Results:  Lumbar spine L1-L4 Femoral neck (FN) 33% distal radius T-score -4.1 RFN: -3.6 LFN: -4.1 n/a Assessment: Patient has OSTEOPOROSIS according to the [facility] classification for osteoporosis (see below). Fracture risk: HIGH Comments: the technical quality of the study is good Evaluation for secondary causes should be considered if clinically indicated. Recommend optimizing calcium  (1200 mg/day) and vitamin D (800 IU/day). Treatment is indicated. Followup: Repeat BMD is appropriate after 1-2 years. WHO criteria for diagnosis of osteoporosis in postmenopausal women and in men 72 y/o or older: - normal: T-score -1.0 to + 1.0 - osteopenia/low bone density: T-score between -2.5 and -1.0 - osteoporosis: T-score below -2.5 - severe osteoporosis: T-score below -2.5 with history of fragility fracture Note: although not part of the WHO classification, the presence of a fragility fracture, regardless of the T-score, should be considered diagnostic of osteoporosis, provided other causes for the fracture have been excluded. Treatment: The National Osteoporosis Foundation recommends that treatment be considered in postmenopausal women and men age 87 or older with: 1. Hip or vertebral (clinical or morphometric) fracture 2. T-score of - 2.5 or lower at the spine or hip 3. 10-year fracture probability by FRAX of at least 20% for a major osteoporotic fracture and 3% for a hip fracture Lela Fendt, MD [facility]   DG Cervical Spine Complete Result Date: 09/17/2024 CLINICAL DATA:  Neck pain. EXAM: CERVICAL SPINE - COMPLETE 4+ VIEW COMPARISON:  Cervical spine radiograph dated 12/20/2022. FINDINGS: Evaluation is very limited due to advanced osteopenia. No definite acute fracture. No subluxation. Lower cervical anterior and posterior fusions. The visualized odontoid  appears intact. There is anatomic alignment of the lateral masses of C1 and C2. The soft tissues are unremarkable. IMPRESSION: 1. No definite acute fracture or subluxation. 2. Advanced osteopenia. Electronically Signed   By: Vanetta Chou M.D.   On: 09/17/2024 16:48    Microbiology: Results for orders placed or performed during the hospital encounter of 10/06/24  Culture, blood (routine x 2)     Status: None   Collection Time: 10/06/24  3:58 AM   Specimen: BLOOD  Result Value Ref Range Status   Specimen Description   Final    BLOOD BLOOD RIGHT FOREARM Performed at [facility], [address], [place], [place] [address]  Special Requests   Final    BOTTLES DRAWN AEROBIC AND ANAEROBIC Blood Culture results may not be optimal due to an inadequate volume of blood received in culture bottles Performed at [facility], [address], [place], [place] [address]    Culture   Final    NO GROWTH 5 DAYS Performed at [facility], [address], [place], [place] [address]    Report Status 10/11/2024 FINAL  Final  Resp panel by RT-PCR (RSV, Flu A&B, Covid) Anterior Nasal Swab     Status: None   Collection Time: 10/06/24  4:00 AM   Specimen: Anterior Nasal Swab  Result Value Ref Range Status   SARS Coronavirus 2 by RT PCR NEGATIVE NEGATIVE Final   Influenza A by PCR NEGATIVE NEGATIVE Final   Influenza B by PCR NEGATIVE NEGATIVE Final    Comment: (NOTE) The Xpert Xpress SARS-CoV-2/FLU/RSV plus assay is intended as an aid in the diagnosis of influenza from Nasopharyngeal swab specimens and should not be used as a sole basis for treatment. Nasal washings and aspirates are unacceptable for Xpert Xpress SARS-CoV-2/FLU/RSV testing.  Fact Sheet for Patients: bloggercourse.com  Fact Sheet for Healthcare Providers: seriousbroker.it  This test is not yet approved or cleared by the [place]  FDA  and has been authorized for detection and/or diagnosis of SARS-CoV-2 by FDA under an Emergency Use Authorization (EUA). This EUA will remain in effect (meaning this test can be used) for the duration of the COVID-19 declaration under Section 564(b)(1) of the Act, 21 U.S.C. section 360bbb-3(b)(1), unless the authorization is terminated or revoked.     Resp Syncytial Virus by PCR NEGATIVE NEGATIVE Final    Comment: (NOTE) Fact Sheet for Patients: bloggercourse.com  Fact Sheet for Healthcare Providers: seriousbroker.it  This test is not yet approved or cleared by the [place]  FDA and has been authorized for detection and/or diagnosis of SARS-CoV-2 by FDA under an Emergency Use Authorization (EUA). This EUA will remain in effect (meaning this test can be used) for the duration of the COVID-19 declaration under Section 564(b)(1) of the Act, 21 U.S.C. section 360bbb-3(b)(1), unless the authorization is terminated or revoked.  Performed at [facility], [address], [place], [place] [address]   Respiratory (~20 pathogens) panel by PCR     Status: None   Collection Time: 10/06/24  4:00 AM   Specimen: Nasopharyngeal Swab; Respiratory  Result Value Ref Range Status   Adenovirus NOT DETECTED NOT DETECTED Final   Coronavirus 229E NOT DETECTED NOT DETECTED Final    Comment: (NOTE) The Coronavirus on the Respiratory Panel, DOES NOT test for the novel  Coronavirus (2019 nCoV)    Coronavirus HKU1 NOT DETECTED NOT DETECTED Final   Coronavirus NL63 NOT DETECTED NOT DETECTED Final   Coronavirus OC43 NOT DETECTED NOT DETECTED Final   Metapneumovirus NOT DETECTED NOT DETECTED Final   Rhinovirus / Enterovirus NOT DETECTED NOT DETECTED Final   Influenza A NOT DETECTED NOT DETECTED Final   Influenza B NOT DETECTED NOT DETECTED Final   Parainfluenza Virus 1 NOT DETECTED NOT DETECTED Final   Parainfluenza Virus 2 NOT DETECTED NOT  DETECTED Final   Parainfluenza Virus 3 NOT DETECTED NOT DETECTED Final   Parainfluenza Virus 4 NOT DETECTED NOT DETECTED Final   Respiratory Syncytial Virus NOT DETECTED NOT DETECTED Final   Bordetella pertussis NOT DETECTED NOT DETECTED Final   Bordetella Parapertussis NOT DETECTED NOT DETECTED Final   Chlamydophila pneumoniae NOT DETECTED NOT DETECTED  Final   Mycoplasma pneumoniae NOT DETECTED NOT DETECTED Final    Comment: Performed at [facility], [address], [place], [place] [address]  Culture, blood (routine x 2)     Status: None   Collection Time: 10/06/24  4:15 AM   Specimen: BLOOD  Result Value Ref Range Status   Specimen Description   Final    BLOOD BLOOD LEFT FOREARM Performed at [facility], [address], [place], [place] [address]    Special Requests   Final    BOTTLES DRAWN AEROBIC AND ANAEROBIC Blood Culture results may not be optimal due to an inadequate volume of blood received in culture bottles Performed at [facility], [address], [place], [place] [address]    Culture   Final    NO GROWTH 5 DAYS Performed at [facility], [address] [place], [place] [address]    Report Status 10/11/2024 FINAL  Final  MRSA Next Gen by PCR, Nasal     Status: None   Collection Time: 10/06/24  6:39 PM   Specimen: Nasal Mucosa; Nasal Swab  Result Value Ref Range Status   MRSA by PCR Next Gen NOT DETECTED NOT DETECTED Final    Comment: (NOTE) The GeneXpert MRSA Assay (FDA approved for NASAL specimens only), is one component of a comprehensive MRSA colonization surveillance program. It is not intended to diagnose MRSA infection nor to guide or monitor treatment for MRSA infections. Test performance is not FDA approved in patients less than 75 years old. Performed at [facility], [address] [place], [place] [address]     Labs: CBC: Recent Labs  Lab 10/06/24 0350 10/06/24 0353  10/07/24 0246 10/08/24 0516  WBC  --  10.2 9.9 7.9  NEUTROABS  --  8.4*  --   --   HGB 11.6* 10.7* 9.8* 9.4*  HCT 34.0* 34.7* 31.1* 30.1*  MCV  --  97.7 96.3 96.5  PLT  --  158 147* 163   Basic Metabolic Panel: Recent Labs  Lab 10/06/24 0350 10/06/24 0353 10/07/24 0246 10/08/24 0516 10/09/24 0616 10/10/24 0452  NA 136 139 140 141  --   --   K 3.7 3.9 5.1 4.3  --   --   CL 95* 97* 103 101  --   --   CO2  --  34* 32 34*  --   --   GLUCOSE 177* 188* 109* 106*  --   --   BUN 15 14 16 20   --   --   CREATININE 0.70 0.59 0.59 0.53  --   --   CALCIUM   --  9.2 9.2 9.1  --   --   MG  --   --   --  2.5* 2.4 2.2  PHOS  --  2.4*  --  2.6  --   --    Liver Function Tests: Recent Labs  Lab 10/06/24 0353 10/07/24 0246 10/08/24 0516  AST 22 13* 14*  ALT 20 13 15   ALKPHOS 141* 83 80  BILITOT 0.3 <0.2 <0.2  PROT 7.1 6.2* 6.1*  ALBUMIN 3.8 3.4* 3.4*   CBG: No results for input(s): GLUCAP in the last 168 hours.  Discharge time spent: less than 30 minutes.  Signed: Toribio Door, MD [facility] 10/11/2024 "

## 2024-10-11 NOTE — Plan of Care (Signed)
   Problem: Health Behavior/Discharge Planning: Goal: Ability to manage health-related needs will improve Outcome: Progressing   Problem: Clinical Measurements: Goal: Ability to maintain clinical measurements within normal limits will improve Outcome: Progressing

## 2024-10-14 ENCOUNTER — Telehealth: Payer: Self-pay

## 2024-10-14 NOTE — Telephone Encounter (Signed)
 Copied from CRM #[id number]. Topic: Clinical - Home Health Verbal Orders >> Oct 14, 2024  2:03 PM Suzen RAMAN wrote: Caller/Agency: Leslie/[facility] Callback Number: [phone] Service Requested: Physical Therapy Frequency: 2 weeks for 2 1 week for  6 Any new concerns about the patient? No

## 2024-10-15 ENCOUNTER — Telehealth: Payer: Self-pay

## 2024-10-15 NOTE — Telephone Encounter (Signed)
 Sonny informed of ok for VO

## 2024-10-15 NOTE — Telephone Encounter (Signed)
 Copied from CRM [id number]. Topic: Clinical - Home Health Verbal Orders >> Oct 15, 2024  4:35 PM Sasha M wrote: Caller/Agency: Jim/[facility] Callback Number: [phone] Service Requested: Occupational Therapy Frequency: once a week for 4 weeks Any new concerns about the patient? No

## 2024-10-16 NOTE — Telephone Encounter (Signed)
 Left detailed message on Jim's voicemail informing him of VO

## 2024-11-06 ENCOUNTER — Encounter: Admitting: Pulmonary Disease

## 2024-11-08 ENCOUNTER — Telehealth: Payer: Self-pay | Admitting: *Deleted

## 2024-11-08 ENCOUNTER — Telehealth: Payer: Self-pay

## 2024-11-08 NOTE — Telephone Encounter (Unsigned)
 Copied from CRM [id number]. Topic: Clinical - Home Health Verbal Orders >> Nov 08, 2024  1:49 PM Wess RAMAN wrote: Caller/Agency: Signe Bell/[facility] Callback Number: [phone] Service Requested: Occupational Therapy Frequency: 1 week 3 Any new concerns about the patient? No

## 2024-11-14 NOTE — Telephone Encounter (Signed)
 Apt scheduled.nfn

## 2024-12-02 ENCOUNTER — Ambulatory Visit: Admitting: Pulmonary Disease

## 2024-12-02 ENCOUNTER — Ambulatory Visit (HOSPITAL_COMMUNITY)

## 2024-12-02 ENCOUNTER — Other Ambulatory Visit

## 2024-12-11 ENCOUNTER — Ambulatory Visit: Admitting: Internal Medicine
